# Patient Record
Sex: Male | Born: 1940 | Race: White | Hispanic: No | Marital: Married | Smoking: Former smoker
Health system: Southern US, Community
[De-identification: ages and names within clinical notes are randomized; demographics above are authoritative.]

## PROBLEM LIST (undated history)

## (undated) DIAGNOSIS — I2510 Atherosclerotic heart disease of native coronary artery without angina pectoris: Secondary | ICD-10-CM

## (undated) DIAGNOSIS — N189 Chronic kidney disease, unspecified: Secondary | ICD-10-CM

## (undated) DIAGNOSIS — I219 Acute myocardial infarction, unspecified: Secondary | ICD-10-CM

## (undated) DIAGNOSIS — M109 Gout, unspecified: Secondary | ICD-10-CM

## (undated) DIAGNOSIS — I480 Paroxysmal atrial fibrillation: Secondary | ICD-10-CM

## (undated) DIAGNOSIS — I10 Essential (primary) hypertension: Secondary | ICD-10-CM

## (undated) DIAGNOSIS — I2699 Other pulmonary embolism without acute cor pulmonale: Secondary | ICD-10-CM

## (undated) DIAGNOSIS — I209 Angina pectoris, unspecified: Secondary | ICD-10-CM

## (undated) DIAGNOSIS — I639 Cerebral infarction, unspecified: Secondary | ICD-10-CM

## (undated) HISTORY — DX: Paroxysmal atrial fibrillation: I48.0

## (undated) HISTORY — PX: CARDIAC CATHETERIZATION: SHX172

## (undated) HISTORY — PX: TOTAL KNEE ARTHROPLASTY: SHX125

## (undated) HISTORY — DX: Other pulmonary embolism without acute cor pulmonale: I26.99

## (undated) HISTORY — DX: Essential (primary) hypertension: I10

## (undated) HISTORY — PX: APPENDECTOMY: SHX54

## (undated) HISTORY — DX: Atherosclerotic heart disease of native coronary artery without angina pectoris: I25.10

---

## 2006-11-05 DIAGNOSIS — K219 Gastro-esophageal reflux disease without esophagitis: Secondary | ICD-10-CM | POA: Insufficient documentation

## 2006-11-06 ENCOUNTER — Ambulatory Visit: Payer: Self-pay | Admitting: Cardiology

## 2006-12-07 ENCOUNTER — Ambulatory Visit: Payer: Self-pay | Admitting: Cardiology

## 2007-06-11 ENCOUNTER — Encounter: Payer: Self-pay | Admitting: Cardiology

## 2007-06-16 ENCOUNTER — Ambulatory Visit: Payer: Self-pay | Admitting: Cardiology

## 2007-12-06 ENCOUNTER — Ambulatory Visit: Payer: Self-pay | Admitting: Cardiology

## 2008-01-18 ENCOUNTER — Encounter: Payer: Self-pay | Admitting: Cardiology

## 2008-01-22 ENCOUNTER — Ambulatory Visit: Payer: Self-pay | Admitting: Cardiology

## 2008-02-22 ENCOUNTER — Ambulatory Visit: Payer: Self-pay | Admitting: Cardiology

## 2008-10-03 ENCOUNTER — Ambulatory Visit: Payer: Self-pay | Admitting: Cardiology

## 2008-10-03 DIAGNOSIS — I4891 Unspecified atrial fibrillation: Secondary | ICD-10-CM | POA: Insufficient documentation

## 2008-10-03 DIAGNOSIS — I10 Essential (primary) hypertension: Secondary | ICD-10-CM | POA: Insufficient documentation

## 2009-01-17 ENCOUNTER — Telehealth (INDEPENDENT_AMBULATORY_CARE_PROVIDER_SITE_OTHER): Payer: Self-pay | Admitting: Physician Assistant

## 2009-01-17 ENCOUNTER — Encounter: Payer: Self-pay | Admitting: Cardiology

## 2009-02-04 ENCOUNTER — Ambulatory Visit: Payer: Self-pay | Admitting: Cardiology

## 2009-02-04 DIAGNOSIS — J310 Chronic rhinitis: Secondary | ICD-10-CM | POA: Insufficient documentation

## 2009-02-04 DIAGNOSIS — I4892 Unspecified atrial flutter: Secondary | ICD-10-CM | POA: Insufficient documentation

## 2009-08-06 ENCOUNTER — Ambulatory Visit (HOSPITAL_COMMUNITY): Admission: RE | Admit: 2009-08-06 | Discharge: 2009-08-06 | Payer: Self-pay | Admitting: General Surgery

## 2009-08-19 ENCOUNTER — Other Ambulatory Visit: Admission: RE | Admit: 2009-08-19 | Discharge: 2009-08-19 | Payer: Self-pay | Admitting: General Surgery

## 2009-10-20 ENCOUNTER — Telehealth (INDEPENDENT_AMBULATORY_CARE_PROVIDER_SITE_OTHER): Payer: Self-pay | Admitting: *Deleted

## 2009-10-27 ENCOUNTER — Ambulatory Visit: Payer: Self-pay | Admitting: Cardiology

## 2009-10-27 ENCOUNTER — Ambulatory Visit: Payer: Self-pay | Admitting: Internal Medicine

## 2009-10-27 ENCOUNTER — Inpatient Hospital Stay (HOSPITAL_COMMUNITY): Admission: RE | Admit: 2009-10-27 | Discharge: 2009-11-12 | Payer: Self-pay | Admitting: Orthopedic Surgery

## 2009-10-30 ENCOUNTER — Encounter: Payer: Self-pay | Admitting: Cardiology

## 2009-10-31 ENCOUNTER — Ambulatory Visit: Payer: Self-pay | Admitting: Vascular Surgery

## 2009-10-31 ENCOUNTER — Encounter (INDEPENDENT_AMBULATORY_CARE_PROVIDER_SITE_OTHER): Payer: Self-pay | Admitting: Orthopedic Surgery

## 2009-10-31 ENCOUNTER — Encounter: Payer: Self-pay | Admitting: Cardiology

## 2009-11-03 ENCOUNTER — Ambulatory Visit: Payer: Self-pay | Admitting: Cardiovascular Disease

## 2009-11-11 ENCOUNTER — Encounter: Payer: Self-pay | Admitting: Cardiology

## 2009-11-12 ENCOUNTER — Encounter: Payer: Self-pay | Admitting: Cardiology

## 2009-11-17 ENCOUNTER — Telehealth: Payer: Self-pay | Admitting: Cardiology

## 2009-11-17 ENCOUNTER — Encounter: Payer: Self-pay | Admitting: Cardiology

## 2009-11-17 LAB — CONVERTED CEMR LAB: POC INR: 1.1

## 2009-11-20 ENCOUNTER — Encounter: Payer: Self-pay | Admitting: Cardiology

## 2009-11-20 ENCOUNTER — Ambulatory Visit: Payer: Self-pay | Admitting: Cardiology

## 2009-11-20 DIAGNOSIS — I259 Chronic ischemic heart disease, unspecified: Secondary | ICD-10-CM | POA: Insufficient documentation

## 2009-11-20 DIAGNOSIS — R609 Edema, unspecified: Secondary | ICD-10-CM | POA: Insufficient documentation

## 2009-11-21 ENCOUNTER — Telehealth: Payer: Self-pay | Admitting: Cardiology

## 2009-11-21 ENCOUNTER — Encounter: Payer: Self-pay | Admitting: Cardiology

## 2009-11-21 LAB — CONVERTED CEMR LAB
POC INR: 2.6
Prothrombin Time: 26.3 s

## 2009-11-28 ENCOUNTER — Encounter: Payer: Self-pay | Admitting: Cardiology

## 2009-12-02 ENCOUNTER — Encounter: Payer: Self-pay | Admitting: Cardiology

## 2009-12-02 LAB — CONVERTED CEMR LAB: INR: 2

## 2009-12-12 ENCOUNTER — Ambulatory Visit: Payer: Self-pay | Admitting: Cardiology

## 2009-12-12 LAB — CONVERTED CEMR LAB: POC INR: 2.6

## 2009-12-19 ENCOUNTER — Ambulatory Visit: Payer: Self-pay | Admitting: Cardiology

## 2009-12-19 ENCOUNTER — Encounter: Payer: Self-pay | Admitting: Cardiology

## 2009-12-19 LAB — CONVERTED CEMR LAB: POC INR: 2.1

## 2009-12-30 ENCOUNTER — Ambulatory Visit: Payer: Self-pay | Admitting: Cardiology

## 2009-12-30 LAB — CONVERTED CEMR LAB: POC INR: 2.1

## 2010-01-08 ENCOUNTER — Ambulatory Visit
Admission: RE | Admit: 2010-01-08 | Discharge: 2010-01-08 | Payer: Self-pay | Source: Home / Self Care | Attending: Cardiology | Admitting: Cardiology

## 2010-01-08 ENCOUNTER — Encounter: Payer: Self-pay | Admitting: Cardiology

## 2010-01-20 ENCOUNTER — Ambulatory Visit: Admission: RE | Admit: 2010-01-20 | Discharge: 2010-01-20 | Payer: Self-pay | Source: Home / Self Care

## 2010-01-20 LAB — CONVERTED CEMR LAB: POC INR: 2

## 2010-02-03 NOTE — Assessment & Plan Note (Signed)
Summary: eph-post cone per rhonda was in mmh 1st   Visit Type:  Follow-up Primary Provider:  Wyvonnia Lora   History of Present Illness: the patient is 69 year old male with a history of approximately a year ago of postoperative atrial fibrillation after knee surgery. The patient was briefly on amiodarone therapy. He has not reported any recurrences over the last year until a couple weeks ago when he presented to the emergency room at [facility]. The patient was awoken in the middle of the night with palpitations and rapid heart rate. Arrival to the [facility] was found to be in atypical atrial flutter with a heart rate of 160 beats per minute. The patient already has taken a 60 mg of p.o. short-acting diltiazem. There was no response. In the ER he was given additional IV diltiazem and beta blocker. The patient converted to normal sinus rhythm. Of note is also has hypertension with recent was started on Benzapril by Dr. Margo Common. The patient states that 6 weeks ago this medication was started. After his ER visit several days later the patient did play 18 holes of golf. He has not reported any recurrent palpitations, chest pain shortness of breath orthopnea or PND. His EKG today is within normal limits.   Preventive Screening-Counseling & Management  Comments: Pt states chewed tobacco off/on for 20-25 yrs and quit about 2 yrs ago. He smoked for about 10-12 yrs and quit in the 1970's  Current Medications (verified): 1)  Alavert 10 Mg Tabs (Loratadine) .... One Tablet Daily 2)  Mvi .... One Tablet Daily 3)  Astelin 137 Mcg/spray Soln (Azelastine Hcl) .... One Spray Two Times A Day 4)  Cardizem Cd 300 Mg Xr24h-Cap (Diltiazem Hcl Coated Beads) .... Take 1 Tablet By Mouth Once A Day (Please Give Generic) 5)  Metoprolol Tartrate 25 Mg Tabs (Metoprolol Tartrate) .... Take One Tablet By Mouth Twice A Day 6)  Aspirin Ec 325 Mg Tbec (Aspirin) .... Take One Tablet By Mouth Daily 7)  Cvs Omeprazole 20 Mg Tbec  (Omeprazole) .... One Tablet At Bedtime 8)  Allopurinol 100 Mg Tabs (Allopurinol) .... Take 2 Tablets At Bedtime 9)  Centrum Silver  Tabs (Multiple Vitamins-Minerals) .... Take 1 Tablet By Mouth Once A Day 10)  Benazepril Hcl 20 Mg Tabs (Benazepril Hcl) .... Take 1 Tablet By Mouth Once A Day  Allergies: No Known Drug Allergies  Comments:  Nurse/Medical Assistant: The patient's medications were reviewed with the patient and were updated in the Medication List. Pt brought a list of medications to office visit.  Cyril Loosen, RN, BSN (February 04, 2009 2:54 PM)  Past History:  Past Medical History: Last updated: 02/26/2008 paroxysmal atrial fibrillation CHAD-2 score is one normal sinus rhythm with no recurrence well-preserved left ventricular function negative Cardiolite stress study long-standing hypertension renal insufficiency followed by primary care physician  Family History: Last updated: 02/26/2008 patient is married and lives with his wife. Patient is retired. No use of tobacco. Occasional use of alcohol  Social History: Last updated: 10/03/2008 the patient denies any tobacco use.  Risk Factors: Smoking Status: quit > 6 months (10/03/2008)  Review of Systems       The patient complains of palpitations.  The patient denies fatigue, malaise, fever, weight gain/loss, vision loss, decreased hearing, hoarseness, chest pain, shortness of breath, prolonged cough, wheezing, sleep apnea, coughing up blood, abdominal pain, blood in stool, nausea, vomiting, diarrhea, heartburn, incontinence, blood in urine, muscle weakness, joint pain, leg swelling, rash, skin lesions, headache, fainting, dizziness,  depression, anxiety, enlarged lymph nodes, easy bruising or bleeding, and environmental allergies.    Vital Signs:  Patient profile:   70 year old male Height:      71 inches Weight:      229.25 pounds Pulse rate:   81 / minute BP sitting:   156 / 91  (left arm) Cuff size:    regular  Vitals Entered By: Cyril Loosen, RN, BSN (February 04, 2009 2:51 PM) Comments Post hospital follow up   Physical Exam  Additional Exam:  General: Well-developed, well-nourished in no distress head: Normocephalic and atraumatic eyes PERRLA/EOMI intact, conjunctiva and lids normal nose: No deformity or lesions mouth normal dentition, normal posterior pharynx neck: Supple, no JVD.  No masses, thyromegaly or abnormal cervical nodes lungs: Normal breath sounds bilaterally without wheezing.  Normal percussion heart: regular rate and rhythm with normal S1 and S2, no S3 or S4.  PMI is normal.  No pathological murmurs abdomen: Normal bowel sounds, abdomen is soft and nontender without masses, organomegaly or hernias noted.  No hepatosplenomegaly musculoskeletal: Back normal, normal gait muscle strength and tone normal pulsus: Pulse is normal in all 4 extremities Extremities: No peripheral pitting edema neurologic: Alert and oriented x 3 skin: Intact without lesions or rashes cervical nodes: No significant adenopathy psychologic: Normal affect    EKG  Procedure date:  02/04/2009  Findings:      normal sinus rhythm with left atrial enlargement no acute ischemic changes. Heart rate 76 beats per minute QRS duration 88 ms. QTC 438 ms  Impression & Recommendations:  Problem # 1:  ATRIAL FLUTTER (ICD-427.32) the patient had an episode of atrial flutter which converted in the emergency room. He has had no recurrent palpitations. I increased his dose of Cardizem CD to 300 mg a day particularly in light of his hypertension today. His blood pressure was 156/91 mmHg. I do not think the patient currently needs an antiarrhythmic drug.at this point I would still be inclined to start the patient on Coumadin given the fact that his CHADS2 is only one His updated medication list for this problem includes:    Metoprolol Tartrate 25 Mg Tabs (Metoprolol tartrate) .[name]... Take one tablet by mouth  twice a day    Aspirin Ec 325 Mg Tbec (Aspirin) .[name]... Take one tablet by mouth daily  Problem # 2:  ESSENTIAL HYPERTENSION, BENIGN (ICD-401.1) the patient has significant hypertension despite the fact that he was recently started on Benzapril. His blood pressure does not improve with higher dose Cardizem we will add hydrochlorothiazide to his medical regimen. He can followup with Dr. Margo Common regarding his blood pressure His updated medication list for this problem includes:    Cardizem Cd 300 Mg Xr24h-cap (Diltiazem hcl coated beads) .[name]... Take 1 tablet by mouth once a day (please give generic)    Metoprolol Tartrate 25 Mg Tabs (Metoprolol tartrate) .[name]... Take one tablet by mouth twice a day    Aspirin Ec 325 Mg Tbec (Aspirin) .[name]... Take one tablet by mouth daily    Benazepril Hcl 20 Mg Tabs (Benazepril hcl) .[name]... Take 1 tablet by mouth once a day  Problem # 3:  CHRONIC RHINITIS (ICD-472.0) I recommended Zyrtec to the patient and told him to avoid any type of Sudafed as this may worsen his arrhythmia.  Other Orders: EKG w/ Interpretation (93000)  Patient Instructions: 1)  Increase Cardizem CD to 300mg  daily 2)  Follow up in  6 months. Prescriptions: CARDIZEM CD 300 MG XR24H-CAP (DILTIAZEM HCL  COATED BEADS) Take 1 tablet by mouth once a day (please give generic)  #30 x 6   Entered by:   Hoover Brunette, LPN   Authorized by:   Lewayne Bunting, MD, [facility]   Signed by:   Hoover Brunette, LPN on 16/10/9602   Method used:   Electronically to        [facility]* (retail)       [address]       [place], [place]  [address]       Ph: [phone]       Fax: [phone]   RxID:   [id number]

## 2010-02-03 NOTE — Progress Notes (Signed)
Summary: coumadin management  Phone Note Other Incoming   Caller: Aram Beecham @ [place] Reason for Call: Discuss lab or test results Summary of Call: Received call with results of PT/INR obtained on pt today.  PT 26.3   INR 2.6    Pt remained on 5mg  qd since last INR check.  Pt to continue coumadin 5mg  once daily and recheck INR on 11/24/09. Initial call taken by: Vashti Hey RN,  November 21, 2009 2:19 PM     Anticoagulant Therapy  Managed by: Vashti Hey RN PCP: Wyvonnia Lora Supervising MD: Andee Lineman MD, Michelle Piper Indication 1: Atrial Fibrillation Lab Used: [facility] Site: [place] PT 26.3 INR POC 2.6  Dietary changes: no    Health status changes: no    Bleeding/hemorrhagic complications: no    Recent/future hospitalizations: no    Any changes in medication regimen? no    Recent/future dental: no  Any missed doses?: no       Is patient compliant with meds? yes         Anticoagulation Management History:      His anticoagulation is being managed by telephone today.  Positive risk factors for bleeding include an age of 29 years or older.  The bleeding index is 'intermediate risk'.  Positive CHADS2 values include History of HTN.  Negative CHADS2 values include Age > 30 years old.  Prothrombin time is 26.3.  Anticoagulation responsible Chantal Worthey: Andee Lineman MD, Michelle Piper.  INR POC: 2.6.    Anticoagulation Management Assessment/Plan:      The patient's current anticoagulation dose is Coumadin 5 mg tabs: use as directed.  The next INR is due 11/24/2009.  Anticoagulation instructions were given to [facility].  Results were reviewed/authorized by Vashti Hey RN.  He was notified by Aram Beecham @ [facility].         Prior Anticoagulation Instructions: INR 1.1 Called Cynthia back.  Pt was d/c from hospital on 11/12/09.  Post op afib and MI.  Was d/c home on coumadin 5mg  once daily.  INR today was 1.1  No bleeding issues or abx.  Order given for pt to increase coumadin to 7.5mg  once daily and Gentiva to  recheck INR on 11/21/09.  Current Anticoagulation Instructions: INR 2.6 Received call with results of PT/INR obtained on pt today.  PT 26.3   INR 2.6    Pt remained on 5mg  qd since last INR check.  Pt to continue coumadin 5mg  once daily and recheck INR on 11/24/09.

## 2010-02-03 NOTE — Medication Information (Signed)
Summary: Coumadin Clinic  Anticoagulant Therapy  Managed by: Vashti Hey RN PCP: Wyvonnia Lora Supervising MD: Andee Lineman MD, Michelle Piper Indication 1: Atrial Fibrillation Lab Used: [facility] Site: [facility]  Dietary changes: no    Health status changes: no    Bleeding/hemorrhagic complications: no    Recent/future hospitalizations: no    Any changes in medication regimen? no    Recent/future dental: no  Any missed doses?: no       Is patient compliant with meds? yes      Comments: Received call from [place] at [place] that she had discharged pt.  INR on 11/28/09 was 2.0  Called pt.  Told to take coumadin 7.5mg  tonight then resume 5mg  once daily and appt made for INR check 12/12/09 in the [place] office.  Allergies: 1)  ! * Oxycodone/apap  Anticoagulation Management History:      His anticoagulation is being managed by telephone today.  Positive risk factors for bleeding include an age of 69 years or older.  The bleeding index is 'intermediate risk'.  Positive CHADS2 values include History of HTN.  Negative CHADS2 values include Age > 69 years old.  Today's INR is 2.0.  Anticoagulation responsible provider: Andee Lineman MD, Michelle Piper.    Anticoagulation Management Assessment/Plan:      The patient's current anticoagulation dose is Coumadin 5 mg tabs: use as directed.  The next INR is due 12/12/2009.  Anticoagulation instructions were given to patient.  Results were reviewed/authorized by Vashti Hey RN.  He was notified by Vashti Hey RN.         Prior Anticoagulation Instructions: INR 2.6 Received call with results of PT/INR obtained on pt today.  PT 26.3   INR 2.6    Pt remained on 5mg  qd since last INR check.  Pt to continue coumadin 5mg  once daily and recheck INR on 11/24/09.  Current Anticoagulation Instructions: INR 2.0 Pt told to take coumadin 7.5mg  tonight then resume 5mg  once daily   Anticoagulant Therapy  Managed by: Vashti Hey RN PCP: Wyvonnia Lora Supervising MD: Andee Lineman MD, Michelle Piper Indication  1: Atrial Fibrillation Lab Used: [facility] Site: [facility]          Comments: Received call from [place] at [place] that she had discharged pt.  INR on 11/28/09 was 2.0  Called pt.  Told to take coumadin 7.5mg  tonight then resume 5mg  once daily and appt made for INR check 12/12/09 in the [place] office.

## 2010-02-03 NOTE — Progress Notes (Signed)
Summary: coumadin management  Phone Note From Other Clinic Call back at [phone]   Caller: [facility] Request: Talk with Nurse Summary of Call: NURSE WOULD LIKE LISA TO CALL BACK TO GO OVER PT RESULTS Initial call taken by: Faythe Ghee,  November 17, 2009 3:00 PM  Follow-up for Phone Call        [place] back.  Pt was d/c from hospital on 11/12/09.  Post op afib and MI.  Was d/c home on coumadin 5mg  once daily.  INR today was 1.1  No bleeding issues or abx.  Order given for pt to increase coumadin to 7.5mg  once daily and Gentiva to recheck INR on 11/21/09. Follow-up by: Vashti Hey RN,  November 17, 2009 4:01 PM     Anticoagulant Therapy  Managed by: Vashti Hey RN PCP: Wyvonnia Lora Supervising MD: Dietrich Pates MD, Molly Maduro Indication 1: Atrial Fibrillation Lab Used: [facility] Site: [place] INR POC 1.1  Dietary changes: no    Health status changes: no    Bleeding/hemorrhagic complications: no    Recent/future hospitalizations: yes       Details: D/C from hospital on 11/12/09 post op afib/MI   Any changes in medication regimen? yes       Details: D/C on coumadin 5mg  qd   Has 5mg  tablet  Recent/future dental: no  Any missed doses?: no       Is patient compliant with meds? yes      Comments: [facility] is seeing pt    Anticoagulation Management History:      His anticoagulation is being managed by telephone today.  Positive risk factors for bleeding include an age of 62 years or older.  The bleeding index is 'intermediate risk'.  Positive CHADS2 values include History of HTN.  Negative CHADS2 values include Age > 21 years old.  Anticoagulation responsible provider: Dietrich Pates MD, Molly Maduro.  INR POC: 1.1.    Anticoagulation Management Assessment/Plan:      The next INR is due 11/21/2009.  Anticoagulation instructions were given to [facility].  Results were reviewed/authorized by Vashti Hey RN.  He was notified by Aram Beecham @ [facility].        Coagulation management  information includes: 11/12/09 D/C from hospital on coumadin 5mg  once daily .  Current Anticoagulation Instructions: INR 1.1 Called Cynthia back.  Pt was d/c from hospital on 11/12/09.  Post op afib and MI.  Was d/c home on coumadin 5mg  once daily.  INR today was 1.1  No bleeding issues or abx.  Order given for pt to increase coumadin to 7.5mg  once daily and Gentiva to recheck INR on 11/21/09.

## 2010-02-03 NOTE — Assessment & Plan Note (Signed)
Summary: 6 mo fu reminder-srs   Visit Type:  Follow-up Primary Provider:  Wyvonnia Lora  CC:  follow-up visit.  History of Present Illness: patient is a pleasant 70 year old male with a history of postoperative atrial fibrillation after knee surgery.  Four.  The patient was on amiodarone therapy.  This has been previously this continued.  He has essentially no cardiovascular complaints.  An EKG done today in the office demonstrates normal sinus rhythm with no acute ischemic changes.  He denies any palpitations, dizziness chest pain short of breath orthopnea PND.  I did point out to the patient that his blood pressures elevated particular with an elevated diastolic blood pressure.  He will have his blood pressure readings rechecked in the next couple of weeks and may need the addition of hydrochlorothiazide to his medical regimen.  Preventive Screening-Counseling & Management  Alcohol-Tobacco     Smoking Status: quit > 6 months     Year Quit: 30-35 yrs ago  Comments: Pt chewed tobacco after smoking but quit 2 yrs ago  Current Problems (verified): 1)  Essential Hypertension, Benign  (ICD-401.1) 2)  Atrial Fibrillation  (ICD-427.31)  Current Medications (verified): 1)  Alavert 10 Mg Tabs (Loratadine) .... One Tablet Daily 2)  Mvi .... One Tablet Daily 3)  Astelin 137 Mcg/spray Soln (Azelastine Hcl) .... One Spray Two Times A Day 4)  Diltiazem Hcl Er Beads 180 Mg Xr24h-Cap (Diltiazem Hcl Er Beads) .... Take One Capsule By Mouth Daily 5)  Metoprolol Tartrate 25 Mg Tabs (Metoprolol Tartrate) .... Take One Tablet By Mouth Twice A Day 6)  Aspirin Ec 325 Mg Tbec (Aspirin) .... Take One Tablet By Mouth Daily 7)  Cvs Omeprazole 20 Mg Tbec (Omeprazole) .... One Tablet At Bedtime 8)  Allopurinol 100 Mg Tabs (Allopurinol) .... Take 2 Tablets At Bedtime 9)  Centrum Silver  Tabs (Multiple Vitamins-Minerals) .... Take 1 Tablet By Mouth Once A Day  Allergies (verified): No Known Drug  Allergies  Past History:  Past Medical History: Last updated: 02/26/2008 paroxysmal atrial fibrillation CHAD-2 score is one normal sinus rhythm with no recurrence well-preserved left ventricular function negative Cardiolite stress study long-standing hypertension renal insufficiency followed by primary care physician  Family History: Last updated: 02/26/2008 patient is married and lives with his wife. Patient is retired. No use of tobacco. Occasional use of alcohol  Risk Factors: Smoking Status: quit > 6 months (10/03/2008)  Family History: Reviewed history from 02/26/2008 and no changes required. patient is married and lives with his wife. Patient is retired. No use of tobacco. Occasional use of alcohol  Social History: Reviewed history and no changes required. the patient denies any tobacco use.Smoking Status:  quit > 6 months  Review of Systems       The patient complains of difficulty walking.  The patient denies anorexia, fever, weight loss, weight gain, vision loss, decreased hearing, hoarseness, chest pain, syncope, dyspnea on exertion, peripheral edema, prolonged cough, headaches, hemoptysis, abdominal pain, melena, hematochezia, severe indigestion/heartburn, hematuria, incontinence, genital sores, muscle weakness, suspicious skin lesions, transient blindness, depression, unusual weight change, abnormal bleeding, enlarged lymph nodes, angioedema, breast masses, and testicular masses.    Vital Signs:  Patient profile:   70 year old male Height:      71 inches Weight:      230.75 pounds BMI:     32.30 O2 Sat:      94 % Pulse rate:   79 / minute BP sitting:   146 / 90  (left arm)  Cuff size:   regular  Vitals Entered By: Cyril Loosen, RN, BSN (October 03, 2008 8:59 AM)  Nutrition Counseling: Patient's BMI is greater than 25 and therefore counseled on weight management options. CC: follow-up visit   Physical Exam  General:  Well developed, well  nourished, in no acute distress. Head:  normocephalic and atraumatic Eyes:  PERRLA/EOM intact; conjunctiva and lids normal. Nose:  no deformity, discharge, inflammation, or lesions Mouth:  Teeth, gums and palate normal. Oral mucosa normal. Neck:  Neck supple, no JVD. No masses, thyromegaly or abnormal cervical nodes. Lungs:  Clear bilaterally to auscultation and percussion. Heart:  Non-displaced PMI, chest non-tender; regular rate and rhythm, S1, S2 without murmurs, rubs or gallops. Carotid upstroke normal, no bruit. Normal abdominal aortic size, no bruits. Femorals normal pulses, no bruits. Pedals normal pulses. No edema, no varicosities. Abdomen:  Bowel sounds positive; abdomen soft and non-tender without masses, organomegaly, or hernias noted. No hepatosplenomegaly. Msk:  Back normal, normal gait. Muscle strength and tone normal. Pulses:  pulses normal in all 4 extremities Extremities:  No clubbing or cyanosis. Neurologic:  Alert and oriented x 3. Skin:  Intact without lesions or rashes. Cervical Nodes:  no significant adenopathy Psych:  Normal affect.   EKG  Procedure date:  10/03/2008  Findings:      normal sinus rhythm no acute ischemic changes  Impression & Recommendations:  Problem # 1:  ESSENTIAL HYPERTENSION, BENIGN (ICD-401.1) the patient blood pressure is poorly controlled.  I asked him to follow-up on this in the next couple of weeks.  He may need the addition of hydrochlorothiazide 25 monos p.o. daily His updated medication list for this problem includes:    Diltiazem Hcl Er Beads 180 Mg Xr24h-cap (Diltiazem hcl er beads) .[name]... Take one capsule by mouth daily    Metoprolol Tartrate 25 Mg Tabs (Metoprolol tartrate) .[name]... Take one tablet by mouth twice a day    Aspirin Ec 325 Mg Tbec (Aspirin) .[name]... Take one tablet by mouth daily  Problem # 2:  ATRIAL FIBRILLATION (ICD-427.31) the patient remains in normal sinus rhythm.  He has discontinued amiodarone.  We will continue  a calcium channel blocker and beta-blocker.  He can continue on aspirin. The following medications were removed from the medication list:    Amiodarone Hcl 200 Mg Tabs (Amiodarone hcl) ..... One tablet daily His updated medication list for this problem includes:    Metoprolol Tartrate 25 Mg Tabs (Metoprolol tartrate) .[name]... Take one tablet by mouth twice a day    Aspirin Ec 325 Mg Tbec (Aspirin) .[name]... Take one tablet by mouth daily  Orders: EKG w/ Interpretation (93000)  Patient Instructions: 1)  Patient will have his blood pressure checked in the next couple of weeks.  2)  He will call us if he needs the addition of hydrochlorothiazide. 3)  Your physician wants you to follow-up in:   1 year.  You will receive a reminder letter in the mail two months in advance. If you don't receive a letter, please call our office to schedule the follow-up appointment.

## 2010-02-03 NOTE — Progress Notes (Signed)
  Phone Note From Other Clinic   Caller: [facility] MD Call For: Dr Lewayne Bunting Reason for Call: Schedule Patient Appt Details for Reason: Afib RVR Summary of Call: Pt had palps, went to [facility] hosp. In afib RVR. IV cardizem and lopressor, spont conv SR. No cp. ER MD felt pt could be d/c - agreed. Needs call for f/u appt. Left msg w/ ofc. Initial call taken by: Park Breed PA-C,  January 17, 2009 6:37 AM

## 2010-02-03 NOTE — Assessment & Plan Note (Signed)
Summary: [facility]   Visit Type:  Follow-up Primary Provider:  Wyvonnia Lora   History of Present Illness: Gregory Davila is a 70 year old gentleman who underwent total knee replacement. He developed chest pain on October 30, 2009 and ultimately ruled in for a non-ST-elevation infarction. His enzymes were markedly elevated. He was treated with an initial medical approaches as his chest pain had resolved and referred for cardiac catheterization. He also has newly diagnosed atrial fibrillation and pulmonary embolus.  the patient had recent venous Dopplers which were negative for DVT. On October 27 he also had a CT angiogram of the chest with a small pulmonary embolism.an echocardiogram was done which was a poor study. However it was felt that the ejection fraction was normal.However mild LV dysfunction by Cath.  the patient was found to have an occluded LAD this was felt to be a chronic occlusion with collaterals. There was moderate circumflex and right coronary artery disease. Dr. Excell Seltzer felt that the patient could be treated with aspirin and Coumadin, the latter for his atrial fibrillation. Given the chronic occlusion it would be little benefit from Plavix and we are trying to avoid triple having quite regimen. Back in a trial fibrillation. HR 134 Feeling good doing rehab. Walking with cane.  No scp. Abdominal gas. No sob.  Was started amidarone in hospital. Just started amiodarone. Monday Home He a The takes PT/INR. Recheck Friday scheduled. Significantluid overload. Needs la six Yellow phlegm check temperature. Cough. Looks anemic will draw CBC next week.   Preventive Screening-Counseling & Management  Alcohol-Tobacco     Smoking Status: quit     Year Quit: 1970  Current Medications (verified): 1)  Loratadine 10 Mg Tabs (Loratadine) .... Take 1 Tablet By Mouth Once A Day 2)  Cardizem Cd 300 Mg Xr24h-Cap (Diltiazem Hcl Coated Beads) .... Take 1 Tablet By Mouth Once A Day 3)   Metoprolol Tartrate 25 Mg Tabs (Metoprolol Tartrate) .... Take 1 Tablet By Mouth Three Times A Day 4)  Aspirin Ec 325 Mg Tbec (Aspirin) .... Take One Tablet By Mouth Daily 5)  Cvs Omeprazole 20 Mg Tbec (Omeprazole) .... One Tablet At Bedtime 6)  Allopurinol 100 Mg Tabs (Allopurinol) .... Take 2 Tablets At Bedtime 7)  Centrum Silver  Tabs (Multiple Vitamins-Minerals) .... Take 1 Tablet By Mouth Once A Day 8)  Benazepril Hcl 20 Mg Tabs (Benazepril Hcl) .... Take 1 Tablet By Mouth Once A Day 9)  Amiodarone Hcl 200 Mg Tabs (Amiodarone Hcl) .... Take 1 Tablet By Mouth Two Times A Day For One Month Then Reduce To Daily 10)  Hydrocodone-Acetaminophen 5-325 Mg Tabs (Hydrocodone-Acetaminophen) .... Take 1-2 By Mouth Every 4 Hours As Needed 11)  Methocarbamol 500 Mg Tabs (Methocarbamol) .... Take One By Mouth Every Six Hours As Needed 12)  Coumadin 5 Mg Tabs (Warfarin Sodium) .... Use As Directed 13)  Zolpidem Tartrate 5 Mg Tabs (Zolpidem Tartrate) .... Take 1 Tablet By Mouth Once A Day At Bedtime 14)  Diltiazem Hcl 60 Mg Tabs (Diltiazem Hcl) .... Take 1 Tablet By Mouth Once A Day As Needed 15)  Lasix 40 Mg Tabs (Furosemide) .... Take 1 Tablet By Mouth Once A Day  Allergies (verified): 1)  ! * Oxycodone/apap  Comments:  Nurse/Medical Assistant: The patient's medication list and allergies were reviewed with the patient and were updated in the Medication and Allergy Lists.  Past History:  Past Medical History: Last updated: 02/26/2008 paroxysmal atrial fibrillation CHAD-2 score is one normal sinus rhythm with no  recurrence well-preserved left ventricular function negative Cardiolite stress study long-standing hypertension renal insufficiency followed by primary care physician  Family History: Last updated: 02/26/2008 patient is married and lives with his wife. Patient is retired. No use of tobacco. Occasional use of alcohol  Social History: Last updated: 10/03/2008 the patient denies  any tobacco use.  Risk Factors: Smoking Status: quit (11/20/2009)  Social History: Smoking Status:  quit  Vital Signs:  Patient profile:   70 year old male Height:      71 inches Weight:      231 pounds BMI:     32.33 Pulse rate:   120 / minute BP sitting:   111 / 70  (left arm) Cuff size:   large  Vitals Entered By: Carlye Grippe (November 20, 2009 10:00 AM)  Nutrition Counseling: Patient's BMI is greater than 25 and therefore counseled on weight management options.  Physical Exam  Additional Exam:  General: Well-developed, well-nourished in no distress, pale-appearing head: Normocephalic and atraumatic eyes PERRLA/EOMI intact, conjunctiva and lids normal nose: No deformity or lesions mouth normal dentition, normal posterior pharynx neck: Supple, no JVD.  No masses, thyromegaly or abnormal cervical nodes lungs: Normal breath sounds bilaterally without wheezing.  Normal percussion heart: irregular rate and rhythm with normal S1 and S2, no S3 or S4.  PMI is normal.  No pathological murmurs abdomen: Normal bowel sounds, abdomen is soft and nontender without masses, organomegaly or hernias noted.  No hepatosplenomegaly musculoskeletal: Back normal, normal gait muscle strength and tone normal pulsus: Pulse is normal in all 4 extremities Extremities: 2-3+ peripheral pitting edema neurologic: Alert and oriented x 3 skin: Intact without lesions or rashes cervical nodes: No significant adenopathy psychologic: Normal affect    EKG  Procedure date:  11/20/2009  Findings:      atrial fibrillation heart rate 134 beats per minute no acute ischemic changes.  Impression & Recommendations:  Problem # 1:  ATRIAL FIBRILLATION (ICD-427.31) the patient is rapid atrial fibrillation despite the fact that recently amiodarone was started in hospital.I will further increase his Cardizem as well as his metoprolol. Amiodarone can be continued for now. If the patient next couple of weeks  does not maintain normal sinus rhythm then we can perform a cardioversion. His updated medication list for this problem includes:    Metoprolol Tartrate 25 Mg Tabs (Metoprolol tartrate) .[name]... Take 1 tablet by mouth three times a day    Aspirin Ec 325 Mg Tbec (Aspirin) .[name]... Take one tablet by mouth daily    Amiodarone Hcl 200 Mg Tabs (Amiodarone hcl) .[name]... Take 1 tablet by mouth two times a day for one month then reduce to daily    Coumadin 5 Mg Tabs (Warfarin sodium) ..... Use as directed  Orders: EKG w/ Interpretation ([id number])Future Orders: T-CBC No Diff ([id number]) ... 11/28/2009 T-Basic Metabolic Panel [id number]) ... 11/28/2009  Problem # 2:  EDEMA (ICD-782.3) the patient has significant edema on exam and given a prescription of Lasix 40 mg a day. I suspect he has diastolic heart failure in the setting of rapid atrial fibrillation. Future Orders: T-CBC No Diff ([id number]) ... 11/28/2009 T-Basic Metabolic Panel [id number]) ... 11/28/2009  Problem # 3:  CORONARY ARTERY DISEASE, S/P PTCA (ICD-414.9) the patient had a non-ST elevation myocardial infarction October 30, 2009. He underwent cardiac catheterization and was found to have a occluded LAD with collaterals. No further intervention was required and the plan was to treat the patient medically. His updated medication list for this problem includes:  Cardizem Cd 300 Mg Xr24h-cap (Diltiazem hcl coated beads) .[name]... Take 1 tablet by mouth once a day    Metoprolol Tartrate 25 Mg Tabs (Metoprolol tartrate) .[name]... Take 1 tablet by mouth three times a day    Aspirin Ec 325 Mg Tbec (Aspirin) .[name]... Take one tablet by mouth daily    Benazepril Hcl 20 Mg Tabs (Benazepril hcl) .[name]... Take 1 tablet by mouth once a day    Coumadin 5 Mg Tabs (Warfarin sodium) ..... Use as directed    Diltiazem Hcl 60 Mg Tabs (Diltiazem hcl) .[name]... Take 1 tablet by mouth once a day as needed  Problem # 4:  COUMADIN THERAPY (ICD-V58.61) the patient is on  chronic Coumadin therapy. His closely monitored at home by home health. I will also ask during his next blood draw to have a CBC drawn to make sure he is not anemic the patient does look pale in the office today  Patient Instructions: 1)  Lasix 40mg  daily 2)  Increase Cardizem CD to 300mg  daily 3)  Increase Metoprolol to 25mg  three times a day  4)  Labs:  home health can draw next Friday 5)  Follow up in  6 weeks Prescriptions: METOPROLOL TARTRATE 25 MG TABS (METOPROLOL TARTRATE) Take 1 tablet by mouth three times a day  #90 x 6   Entered by:   Hoover Brunette, LPN   Authorized by:   Lewayne Bunting, MD, [facility]   Signed by:   Hoover Brunette, LPN on 16/10/9602   Method used:   Electronically to        [facility]* (retail)       [address]       [place], [place]  [address]       Ph: [phone]       Fax: [phone]   RxID:   [id number] CARDIZEM CD 300 MG XR24H-CAP (DILTIAZEM HCL COATED BEADS) Take 1 tablet by mouth once a day  #30 x 6   Entered by:   Hoover Brunette, LPN   Authorized by:   Lewayne Bunting, MD, [facility]   Signed by:   Hoover Brunette, LPN on 10/31/2534   Method used:   Electronically to        [facility]* (retail)       [address]       [place], [place]  [address]       Ph: [phone]       Fax: [phone]   RxID:   [id number] LASIX 40 MG TABS (FUROSEMIDE) Take 1 tablet by mouth once a day  #30 x 6   Entered by:   Hoover Brunette, LPN   Authorized by:   Lewayne Bunting, MD, [facility]   Signed by:   Hoover Brunette, LPN on 32/35/5732   Method used:   Electronically to        [facility]* (retail)       [address]       [place], [place]  [address]       Ph: [phone]       Fax: [phone]   RxID:   [id number]

## 2010-02-03 NOTE — Medication Information (Signed)
Summary: [facility]  Anticoagulant Therapy  Managed by: Vashti Hey RN PCP: Wyvonnia Lora Supervising MD: Myrtis Ser MD, Tinnie Gens Indication 1: Atrial Fibrillation Lab Used: [facility] Site: [facility] INR POC 2.6  Dietary changes: no    Health status changes: no    Bleeding/hemorrhagic complications: no    Recent/future hospitalizations: no    Any changes in medication regimen? no    Recent/future dental: no  Any missed doses?: no       Is patient compliant with meds? yes       Allergies: 1)  ! * Oxycodone/apap  Anticoagulation Management History:      The patient is taking warfarin and comes in today for a routine follow up visit.  Positive risk factors for bleeding include an age of 69 years or older.  The bleeding index is 'intermediate risk'.  Positive CHADS2 values include History of HTN.  Negative CHADS2 values include Age > 72 years old.  His last INR was 2.0.  Anticoagulation responsible provider: Myrtis Ser MD, Tinnie Gens.  INR POC: 2.6.  Cuvette Lot#: [id number].    Anticoagulation Management Assessment/Plan:      The patient's current anticoagulation dose is Coumadin 5 mg tabs: use as directed.  The target INR is 2.0-3.0.  The next INR is due 12/30/2009.  Anticoagulation instructions were given to patient.  Results were reviewed/authorized by Vashti Hey RN.  He was notified by Vashti Hey RN.         Prior Anticoagulation Instructions: INR 2.0 Pt told to take coumadin 7.5mg  tonight then resume 5mg  once daily   Current Anticoagulation Instructions: INR 2.6 Continue coumadin 5mg  once daily  Prescriptions: COUMADIN 5 MG TABS (WARFARIN SODIUM) use as directed  #30 x 3   Entered by:   Vashti Hey RN   Authorized by:   Lewayne Bunting, MD, [facility]   Signed by:   Vashti Hey RN on 12/12/2009   Method used:   Electronically to        [facility]* (retail)       [address]       [place], [place]  [address]       Ph: [phone]       Fax: [phone]   RxID:   [id number]

## 2010-02-03 NOTE — Progress Notes (Signed)
Summary: Records Request  Faxed chest x-ray & EKG to [facility] at [facility] Pre-Op ([phone]). Debby Freiberg  October 20, 2009 2:48 PM

## 2010-02-05 NOTE — Medication Information (Signed)
Summary: [facility] - [facility]  Anticoagulant Therapy  Managed by: Vashti Hey RN PCP: Wyvonnia Lora Supervising MD: Andee Lineman MD, Michelle Piper Indication 1: Atrial Fibrillation Lab Used: [facility] Site: [facility] INR POC 2.1  Dietary changes: no    Health status changes: yes       Details: pt in NSR by EKG today  Bleeding/hemorrhagic complications: no    Recent/future hospitalizations: no    Any changes in medication regimen? no    Recent/future dental: no  Any missed doses?: no       Is patient compliant with meds? yes       Allergies: 1)  ! * Oxycodone/apap  Anticoagulation Management History:      The patient is taking warfarin and comes in today for a routine follow up visit.  Positive risk factors for bleeding include an age of 69 years or older.  The bleeding index is 'intermediate risk'.  Positive CHADS2 values include History of HTN.  Negative CHADS2 values include Age > 69 years old.  His last INR was 2.0.  Anticoagulation responsible provider: Andee Lineman MD, Michelle Piper.  INR POC: 2.1.  Cuvette Lot#: [id number].    Anticoagulation Management Assessment/Plan:      The patient's current anticoagulation dose is Coumadin 5 mg tabs: use as directed.  The target INR is 2.0-3.0.  The next INR is due 12/30/2009.  Anticoagulation instructions were given to patient.  Results were reviewed/authorized by Vashti Hey RN.  He was notified by Vashti Hey RN.        Coagulation management information includes: 11/12/09 D/C from hospital on coumadin 5mg  once daily   12/19/09  NSR by EKG.  Prior Anticoagulation Instructions: INR 2.6 Continue coumadin 5mg  once daily   Current Anticoagulation Instructions: INR 2.1 Continue coumadin 5mg  once daily

## 2010-02-05 NOTE — Assessment & Plan Note (Signed)
Summary: [facility]   Visit Type:  Follow-up Primary Provider:  Wyvonnia Lora   History of Present Illness: the patient is a 70 year old male with a history of coronary artery disease, prior pulmonary embolism as well as atrial fibrillation on Coumadin therapy. At cardiac catheterization the patient was found to have an occluded LAD with collaterals and moderate circumflex and right coronary artery disease.  In the interim the patient is in normal sinus rhythm. He is on amiodarone. He is on 200 mg.his CHADS2 score is one. He denies any substernal chest pain. His blood pressure readings have been running normal. He denies any shortness of breath orthopnea PND.  He is currently not on a statin drug. He denies any palpitations presyncope or syncope.  Preventive Screening-Counseling & Management  Alcohol-Tobacco     Smoking Status: quit     Year Quit: 1970  Current Medications (verified): 1)  Loratadine 10 Mg Tabs (Loratadine) .... Take 1 Tablet By Mouth Once A Day 2)  Cardizem Cd 300 Mg Xr24h-Cap (Diltiazem Hcl Coated Beads) .... Take 1 Tablet By Mouth Once A Day 3)  Metoprolol Tartrate 25 Mg Tabs (Metoprolol Tartrate) .... Take 1 Tablet By Mouth Three Times A Day 4)  Aspirin Ec 325 Mg Tbec (Aspirin) .... Take One Tablet By Mouth Daily 5)  Cvs Omeprazole 20 Mg Tbec (Omeprazole) .... One Tablet At Bedtime 6)  Allopurinol 100 Mg Tabs (Allopurinol) .... Take 2 Tablets At Bedtime 7)  Benazepril Hcl 20 Mg Tabs (Benazepril Hcl) .... Take 1 Tablet By Mouth Once A Day 8)  Amiodarone Hcl 200 Mg Tabs (Amiodarone Hcl) .... Take 1/2 Tab (100mg ) Daily 9)  Hydrocodone-Acetaminophen 5-325 Mg Tabs (Hydrocodone-Acetaminophen) .... Take 1-2 By Mouth Every 4 Hours As Needed 10)  Methocarbamol 500 Mg Tabs (Methocarbamol) .... Take One By Mouth Every Six Hours As Needed 11)  Coumadin 5 Mg Tabs (Warfarin Sodium) .... Use As Directed 12)  Diltiazem Hcl 60 Mg Tabs (Diltiazem Hcl) .... Take 1 Tablet By Mouth Once  A Day As Needed 13)  Ambien 5 Mg Tabs (Zolpidem Tartrate) .... Take 1 Tab By Mouth At Bedtime 14)  Pravachol 20 Mg Tabs (Pravastatin Sodium) .... Take 1 Tab By Mouth At Bedtime  Allergies (verified): 1)  ! * Oxycodone/apap  Comments:  Nurse/Medical Assistant: The patient's medication list and allergies were reviewed with the patient and were updated in the Medication and Allergy Lists.  Past History:  Family History: Last updated: 02/26/2008 patient is married and lives with his wife. Patient is retired. No use of tobacco. Occasional use of alcohol  Social History: Last updated: 10/03/2008 the patient denies any tobacco use.  Past Medical History: paroxysmal atrial fibrillation CHAD-2 score is one normal sinus rhythm with no recurrence well-preserved left ventricular function negative Cardiolite stress study long-standing hypertension renal insufficiency followed by primary care physician Coronary artery disease single vessel occluded LAD with left to right collaterals and moderate disease in the right coronary artery and circumflex  Review of Systems  The patient denies fatigue, malaise, fever, weight gain/loss, vision loss, decreased hearing, hoarseness, chest pain, palpitations, shortness of breath, prolonged cough, wheezing, sleep apnea, coughing up blood, abdominal pain, blood in stool, nausea, vomiting, diarrhea, heartburn, incontinence, blood in urine, muscle weakness, joint pain, leg swelling, rash, skin lesions, headache, fainting, dizziness, depression, anxiety, enlarged lymph nodes, easy bruising or bleeding, and environmental allergies.    Vital Signs:  Patient profile:   70 year old male Height:      73  inches Weight:      227 pounds Pulse rate:   65 / minute BP sitting:   118 / 78  (left arm) Cuff size:   large  Vitals Entered By: Carlye Grippe (January 08, 2010 9:59 AM)  Physical Exam  Additional Exam:  General: Well-developed, well-nourished in no  distress, pale-appearing head: Normocephalic and atraumatic eyes PERRLA/EOMI intact, conjunctiva and lids normal nose: No deformity or lesions mouth normal dentition, normal posterior pharynx neck: Supple, no JVD.  No masses, thyromegaly or abnormal cervical nodes lungs: Normal breath sounds bilaterally without wheezing.  Normal percussion heart: irregular rate and rhythm with normal S1 and S2, no S3 or S4.  PMI is normal.  No pathological murmurs abdomen: Normal bowel sounds, abdomen is soft and nontender without masses, organomegaly or hernias noted.  No hepatosplenomegaly musculoskeletal: Back normal, normal gait muscle strength and tone normal pulsus: Pulse is normal in all 4 extremities Extremities: 2-3+ peripheral pitting edema neurologic: Alert and oriented x 3 skin: Intact without lesions or rashes cervical nodes: No significant adenopathy psychologic: Normal affect    Impression & Recommendations:  Problem # 1:  ATRIAL FIBRILLATION (ICD-427.31) patient remains in normal sinus rhythm on amiodarone therapy. I have decreased amiodarone 100mg  p.o. q. daily His updated medication list for this problem includes:    Metoprolol Tartrate 25 Mg Tabs (Metoprolol tartrate) .[name]... Take 1 tablet by mouth three times a day    Aspirin Ec 325 Mg Tbec (Aspirin) .[name]... Take one tablet by mouth daily    Amiodarone Hcl 200 Mg Tabs (Amiodarone hcl) .[name]... Take 1/2 tab (100mg ) daily    Coumadin 5 Mg Tabs (Warfarin sodium) ..... Use as directed  Problem # 2:  ESSENTIAL HYPERTENSION, BENIGN (ICD-401.1) blood pressure is well-controlled. No further adjustments in medications as needed The following medications were removed from the medication list:    Lasix 40 Mg Tabs (Furosemide) .[name]... Take 1 tablet by mouth once a day His updated medication list for this problem includes:    Cardizem Cd 300 Mg Xr24h-cap (Diltiazem hcl coated beads) .[name]... Take 1 tablet by mouth once a day    Metoprolol Tartrate 25 Mg  Tabs (Metoprolol tartrate) .[name]... Take 1 tablet by mouth three times a day    Aspirin Ec 325 Mg Tbec (Aspirin) .[name]... Take one tablet by mouth daily    Benazepril Hcl 20 Mg Tabs (Benazepril hcl) .[name]... Take 1 tablet by mouth once a day    Diltiazem Hcl 60 Mg Tabs (Diltiazem hcl) .[name]... Take 1 tablet by mouth once a day as needed  Problem # 3:  CORONARY ARTERY DISEASE, S/P PTCA (ICD-414.9) the patient has coronary artery disease and needs a statin for secondary risk prevention. I have started Pravachol 20 mg p.o. q.h.s. he reports no recurrent chest pain. He does have moderate disease in the circumflex and right coronary artery and has an occluded LAD with collaterals His updated medication list for this problem includes:    Cardizem Cd 300 Mg Xr24h-cap (Diltiazem hcl coated beads) .[name]... Take 1 tablet by mouth once a day    Metoprolol Tartrate 25 Mg Tabs (Metoprolol tartrate) .[name]... Take 1 tablet by mouth three times a day    Aspirin Ec 325 Mg Tbec (Aspirin) .[name]... Take one tablet by mouth daily    Benazepril Hcl 20 Mg Tabs (Benazepril hcl) .[name]... Take 1 tablet by mouth once a day    Coumadin 5 Mg Tabs (Warfarin sodium) ..... Use as directed    Diltiazem  Hcl 60 Mg Tabs (Diltiazem hcl) .[name]... Take 1 tablet by mouth once a day as needed  Problem # 4:  COUMADIN THERAPY (ICD-V58.61) we will continue the patient on Coumadin. However in 5 months after he has completed a six-month course of Coumadin, this can likely be discontinued.  Patient Instructions: 1)  Decrease Amiodarone to 100mg  daily 2)  Ambien 5mg  at bedtime 3)  Pravachol 20mg  at bedtime 4)  Follow up in  6 months Prescriptions: PRAVACHOL 20 MG TABS (PRAVASTATIN SODIUM) Take 1 tab by mouth at bedtime  #30 x 6   Entered by:   Hoover Brunette, LPN   Authorized by:   Lewayne Bunting, MD, [facility]   Signed by:   Hoover Brunette, LPN on 16/10/9602   Method used:   Electronically to        [facility]* (retail)       [address]       [place], [place]  [address]       Ph: [phone]       Fax: [phone]   RxID:   [id number] AMIODARONE HCL 200 MG TABS (AMIODARONE HCL) take 1/2 tab (100mg ) daily  #45 x 3   Entered by:   Hoover Brunette, LPN   Authorized by:   Lewayne Bunting, MD, [facility]   Signed by:   Hoover Brunette, LPN on 10/31/2534   Method used:   Electronically to        [facility]* (retail)       [address]       [place], [place]  [address]       Ph: [phone]       Fax: [phone]   RxID:   [id number] PRAVACHOL 20 MG TABS (PRAVASTATIN SODIUM) Take 1 tab by mouth at bedtime  #30 x 6   Entered by:   Hoover Brunette, LPN   Authorized by:   Lewayne Bunting, MD, [facility]   Signed by:   Hoover Brunette, LPN on 32/35/5732   Method used:   Print then Give to Patient   RxID:   [id number]   Handout requested. AMBIEN 5 MG TABS (ZOLPIDEM TARTRATE) Take 1 tab by mouth at bedtime  #30 x 2   Entered by:   Hoover Brunette, LPN   Authorized by:   Lewayne Bunting, MD, [facility]   Signed by:   Hoover Brunette, LPN on 15/17/6160   Method used:   Print then Give to Patient   RxID:   [id number]   Handout requested.

## 2010-02-05 NOTE — Medication Information (Signed)
Summary: ccr-lr  Anticoagulant Therapy  Managed by: Vashti Hey RN Referring MD: Andee Lineman PCP: Wyvonnia Lora Supervising MD: Andee Lineman MD, Michelle Piper Indication 1: Atrial Fibrillation Lab Used: [facility] [place] Site: [place] INR POC 2.0  Dietary changes: no    Health status changes: no    Bleeding/hemorrhagic complications: no    Recent/future hospitalizations: no    Any changes in medication regimen? no    Recent/future dental: no  Any missed doses?: no       Is patient compliant with meds? yes       Allergies: 1)  ! * Oxycodone/apap  Anticoagulation Management History:      The patient is taking warfarin and comes in today for a routine follow up visit.  Positive risk factors for bleeding include an age of 69 years or older.  The bleeding index is 'intermediate risk'.  Positive CHADS2 values include History of HTN.  Negative CHADS2 values include Age > 69 years old.  His last INR was 2.0.  Anticoagulation responsible provider: Andee Lineman MD, Michelle Piper.  INR POC: 2.0.    Anticoagulation Management Assessment/Plan:      The patient's current anticoagulation dose is Coumadin 5 mg tabs: use as directed.  The target INR is 2.0-3.0.  The next INR is due 02/17/2010.  Anticoagulation instructions were given to patient.  Results were reviewed/authorized by Vashti Hey RN.  He was notified by Vashti Hey RN.         Prior Anticoagulation Instructions: INR 2.1 Continue coumadin 5mg  once daily   Current Anticoagulation Instructions: INR 2.0 Increase coumadin to 5mg  once daily except 7.5mg  on Tuesdays

## 2010-02-05 NOTE — Medication Information (Signed)
Summary: ccr-lr  Anticoagulant Therapy  Managed by: Vashti Hey RN PCP: Wyvonnia Lora Supervising MD: Andee Lineman MD, Michelle Piper Indication 1: Atrial Fibrillation Lab Used: [facility] Site: [place] INR POC 2.1  Dietary changes: no    Health status changes: no    Bleeding/hemorrhagic complications: no    Recent/future hospitalizations: no    Any changes in medication regimen? no    Recent/future dental: no  Any missed doses?: no       Is patient compliant with meds? yes       Allergies: 1)  ! * Oxycodone/apap  Anticoagulation Management History:      The patient is taking warfarin and comes in today for a routine follow up visit.  Positive risk factors for bleeding include an age of 69 years or older.  The bleeding index is 'intermediate risk'.  Positive CHADS2 values include History of HTN.  Negative CHADS2 values include Age > 69 years old.  His last INR was 2.0.  Anticoagulation responsible Lamesha Tibbits: Andee Lineman MD, Michelle Piper.  INR POC: 2.1.    Anticoagulation Management Assessment/Plan:      The patient's current anticoagulation dose is Coumadin 5 mg tabs: use as directed.  The target INR is 2.0-3.0.  The next INR is due 01/20/2010.  Anticoagulation instructions were given to patient.  Results were reviewed/authorized by Vashti Hey RN.  He was notified by Vashti Hey RN.         Prior Anticoagulation Instructions: INR 2.1 Continue coumadin 5mg  once daily   Current Anticoagulation Instructions: Same as Prior Instructions.

## 2010-02-05 NOTE — Assessment & Plan Note (Signed)
Summary: ekg  --agh  Nurse Visit   Vital Signs:  Patient profile:   70 year old male Height:      71 inches Weight:      231 pounds  Vitals Entered By: Carlye Grippe (December 19, 2009 8:30 AM)  Visit Type:  nurse visit  CC:  ekg.  CC: ekg   Preventive Screening-Counseling & Management  Alcohol-Tobacco     Smoking Status: quit     Year Quit: 1970  Past History:  Past Medical History: Last updated: 02/26/2008 paroxysmal atrial fibrillation CHAD-2 score is one normal sinus rhythm with no recurrence well-preserved left ventricular function negative Cardiolite stress study long-standing hypertension renal insufficiency followed by primary care physician   Current Medications (verified): 1)  Loratadine 10 Mg Tabs (Loratadine) .... Take 1 Tablet By Mouth Once A Day 2)  Cardizem Cd 300 Mg Xr24h-Cap (Diltiazem Hcl Coated Beads) .... Take 1 Tablet By Mouth Once A Day 3)  Metoprolol Tartrate 25 Mg Tabs (Metoprolol Tartrate) .... Take 1 Tablet By Mouth Three Times A Day 4)  Aspirin Ec 325 Mg Tbec (Aspirin) .... Take One Tablet By Mouth Daily 5)  Cvs Omeprazole 20 Mg Tbec (Omeprazole) .... One Tablet At Bedtime 6)  Allopurinol 100 Mg Tabs (Allopurinol) .... Take 2 Tablets At Bedtime 7)  Centrum Silver  Tabs (Multiple Vitamins-Minerals) .... Take 1 Tablet By Mouth Once A Day 8)  Benazepril Hcl 20 Mg Tabs (Benazepril Hcl) .... Take 1 Tablet By Mouth Once A Day 9)  Amiodarone Hcl 200 Mg Tabs (Amiodarone Hcl) .... Take 1 Tablet By Mouth Two Times A Day For One Month Then Reduce To Daily 10)  Hydrocodone-Acetaminophen 5-325 Mg Tabs (Hydrocodone-Acetaminophen) .... Take 1-2 By Mouth Every 4 Hours As Needed 11)  Methocarbamol 500 Mg Tabs (Methocarbamol) .... Take One By Mouth Every Six Hours As Needed 12)  Coumadin 5 Mg Tabs (Warfarin Sodium) .... Use As Directed 13)  Zolpidem Tartrate 5 Mg Tabs (Zolpidem Tartrate) .... Take 1 Tablet By Mouth Once A Day At Bedtime 14)  Diltiazem  Hcl 60 Mg Tabs (Diltiazem Hcl) .... Take 1 Tablet By Mouth Once A Day As Needed 15)  Lasix 40 Mg Tabs (Furosemide) .... Take 1 Tablet By Mouth Once A Day  Allergies (verified): 1)  ! * Oxycodone/apap  Comments:  Nurse/Medical Assistant: The patient is currently on medications but does not know the name or dosage at this time. Instructed to contact our office with details. Will update medication list at that time.  Orders Added: 1)  EKG w/ Interpretation [93000] Reviewed. Lewayne Bunting, MD, [facility]  December 24, 2009 5:13 AM

## 2010-02-17 ENCOUNTER — Encounter: Payer: Self-pay | Admitting: Cardiology

## 2010-02-17 ENCOUNTER — Encounter (INDEPENDENT_AMBULATORY_CARE_PROVIDER_SITE_OTHER): Payer: Medicare Other

## 2010-02-17 DIAGNOSIS — Z7901 Long term (current) use of anticoagulants: Secondary | ICD-10-CM

## 2010-02-17 DIAGNOSIS — I4891 Unspecified atrial fibrillation: Secondary | ICD-10-CM

## 2010-02-17 LAB — CONVERTED CEMR LAB: POC INR: 2.1

## 2010-02-25 NOTE — Medication Information (Signed)
Summary: ccr  Anticoagulant Therapy  Managed by: Vashti Hey RN Referring MD: Andee Lineman PCP: Wyvonnia Lora Supervising MD: Andee Lineman MD, Michelle Piper Indication 1: Atrial Fibrillation Lab Used: [facility] [place] Site: [place] INR POC 2.1  Dietary changes: no    Health status changes: no    Bleeding/hemorrhagic complications: no    Recent/future hospitalizations: no    Any changes in medication regimen? no    Recent/future dental: no  Any missed doses?: no       Is patient compliant with meds? yes       Allergies: 1)  ! * Oxycodone/apap  Anticoagulation Management History:      The patient is taking warfarin and comes in today for a routine follow up visit.  Positive risk factors for bleeding include an age of 69 years or older.  The bleeding index is 'intermediate risk'.  Positive CHADS2 values include History of HTN.  Negative CHADS2 values include Age > 69 years old.  His last INR was 2.0.  Anticoagulation responsible provider: Andee Lineman MD, Michelle Piper.  INR POC: 2.1.  Cuvette Lot#: [id number].    Anticoagulation Management Assessment/Plan:      The patient's current anticoagulation dose is Coumadin 5 mg tabs: use as directed.  The target INR is 2.0-3.0.  The next INR is due 03/17/2010.  Anticoagulation instructions were given to patient.  Results were reviewed/authorized by Vashti Hey RN.  He was notified by Vashti Hey RN.        Coagulation management information includes: 11/12/09 D/C from hospital on coumadin 5mg  once daily   12/19/09  NSR by EKG   MAY BE ABLET TO COME OFF COUMADIN 5/12 PER DR DEGENT.  Prior Anticoagulation Instructions: INR 2.0 Increase coumadin to 5mg  once daily except 7.5mg  on Tuesdays  Current Anticoagulation Instructions: INR 2.1 Continue coumadin 5mg  once daily except 7.5mg  on Tuesdays

## 2010-03-17 ENCOUNTER — Encounter (INDEPENDENT_AMBULATORY_CARE_PROVIDER_SITE_OTHER): Payer: Medicare Other

## 2010-03-17 ENCOUNTER — Encounter: Payer: Self-pay | Admitting: Cardiology

## 2010-03-17 DIAGNOSIS — I4891 Unspecified atrial fibrillation: Secondary | ICD-10-CM

## 2010-03-17 DIAGNOSIS — Z7901 Long term (current) use of anticoagulants: Secondary | ICD-10-CM

## 2010-03-17 LAB — HEPARIN LEVEL (UNFRACTIONATED)
Heparin Unfractionated: 0.27 IU/mL — ABNORMAL LOW (ref 0.30–0.70)
Heparin Unfractionated: 0.27 IU/mL — ABNORMAL LOW (ref 0.30–0.70)
Heparin Unfractionated: 0.29 IU/mL — ABNORMAL LOW (ref 0.30–0.70)
Heparin Unfractionated: 0.32 IU/mL (ref 0.30–0.70)
Heparin Unfractionated: 0.33 IU/mL (ref 0.30–0.70)
Heparin Unfractionated: 0.36 IU/mL (ref 0.30–0.70)
Heparin Unfractionated: 0.45 IU/mL (ref 0.30–0.70)
Heparin Unfractionated: 0.48 IU/mL (ref 0.30–0.70)
Heparin Unfractionated: 0.5 IU/mL (ref 0.30–0.70)

## 2010-03-17 LAB — CBC
HCT: 29.6 % — ABNORMAL LOW (ref 39.0–52.0)
HCT: 30.2 % — ABNORMAL LOW (ref 39.0–52.0)
HCT: 30.9 % — ABNORMAL LOW (ref 39.0–52.0)
HCT: 31 % — ABNORMAL LOW (ref 39.0–52.0)
HCT: 31 % — ABNORMAL LOW (ref 39.0–52.0)
HCT: 31.5 % — ABNORMAL LOW (ref 39.0–52.0)
Hemoglobin: 10.1 g/dL — ABNORMAL LOW (ref 13.0–17.0)
Hemoglobin: 10.3 g/dL — ABNORMAL LOW (ref 13.0–17.0)
Hemoglobin: 10.4 g/dL — ABNORMAL LOW (ref 13.0–17.0)
Hemoglobin: 10.5 g/dL — ABNORMAL LOW (ref 13.0–17.0)
Hemoglobin: 10.5 g/dL — ABNORMAL LOW (ref 13.0–17.0)
Hemoglobin: 10.7 g/dL — ABNORMAL LOW (ref 13.0–17.0)
MCH: 30.8 pg (ref 26.0–34.0)
MCH: 30.8 pg (ref 26.0–34.0)
MCH: 30.8 pg (ref 26.0–34.0)
MCH: 31 pg (ref 26.0–34.0)
MCH: 31 pg (ref 26.0–34.0)
MCH: 31.3 pg (ref 26.0–34.0)
MCHC: 33.7 g/dL (ref 30.0–36.0)
MCHC: 33.8 g/dL (ref 30.0–36.0)
MCHC: 33.9 g/dL (ref 30.0–36.0)
MCHC: 34.1 g/dL (ref 30.0–36.0)
MCHC: 34.3 g/dL (ref 30.0–36.0)
MCHC: 34.3 g/dL (ref 30.0–36.0)
MCV: 90.5 fL (ref 78.0–100.0)
MCV: 91 fL (ref 78.0–100.0)
MCV: 91.1 fL (ref 78.0–100.0)
MCV: 91.1 fL (ref 78.0–100.0)
MCV: 91.3 fL (ref 78.0–100.0)
MCV: 91.4 fL (ref 78.0–100.0)
Platelets: 422 10*3/uL — ABNORMAL HIGH (ref 150–400)
Platelets: 509 10*3/uL — ABNORMAL HIGH (ref 150–400)
Platelets: 592 10*3/uL — ABNORMAL HIGH (ref 150–400)
Platelets: 616 10*3/uL — ABNORMAL HIGH (ref 150–400)
Platelets: 626 10*3/uL — ABNORMAL HIGH (ref 150–400)
Platelets: 685 10*3/uL — ABNORMAL HIGH (ref 150–400)
RBC: 3.27 MIL/uL — ABNORMAL LOW (ref 4.22–5.81)
RBC: 3.31 MIL/uL — ABNORMAL LOW (ref 4.22–5.81)
RBC: 3.38 MIL/uL — ABNORMAL LOW (ref 4.22–5.81)
RBC: 3.4 MIL/uL — ABNORMAL LOW (ref 4.22–5.81)
RBC: 3.41 MIL/uL — ABNORMAL LOW (ref 4.22–5.81)
RBC: 3.46 MIL/uL — ABNORMAL LOW (ref 4.22–5.81)
RDW: 14.6 % (ref 11.5–15.5)
RDW: 14.9 % (ref 11.5–15.5)
RDW: 15 % (ref 11.5–15.5)
RDW: 15.1 % (ref 11.5–15.5)
RDW: 15.3 % (ref 11.5–15.5)
RDW: 15.3 % (ref 11.5–15.5)
WBC: 11.6 10*3/uL — ABNORMAL HIGH (ref 4.0–10.5)
WBC: 13.6 10*3/uL — ABNORMAL HIGH (ref 4.0–10.5)
WBC: 14.3 10*3/uL — ABNORMAL HIGH (ref 4.0–10.5)
WBC: 14.4 10*3/uL — ABNORMAL HIGH (ref 4.0–10.5)
WBC: 15.6 10*3/uL — ABNORMAL HIGH (ref 4.0–10.5)
WBC: 15.7 10*3/uL — ABNORMAL HIGH (ref 4.0–10.5)

## 2010-03-17 LAB — BASIC METABOLIC PANEL
BUN: 12 mg/dL (ref 6–23)
BUN: 17 mg/dL (ref 6–23)
CO2: 23 mEq/L (ref 19–32)
CO2: 24 mEq/L (ref 19–32)
Calcium: 8.4 mg/dL (ref 8.4–10.5)
Calcium: 8.6 mg/dL (ref 8.4–10.5)
Chloride: 100 mEq/L (ref 96–112)
Chloride: 99 mEq/L (ref 96–112)
Creatinine, Ser: 1.21 mg/dL (ref 0.4–1.5)
Creatinine, Ser: 1.35 mg/dL (ref 0.4–1.5)
GFR calc Af Amer: >60 mL/min (ref >60)
GFR calc Af Amer: >60 mL/min (ref >60)
GFR calc non Af Amer: 53 mL/min — ABNORMAL LOW (ref >60)
GFR calc non Af Amer: 60 mL/min — ABNORMAL LOW (ref >60)
Glucose, Bld: 109 mg/dL — ABNORMAL HIGH (ref 70–99)
Glucose, Bld: 110 mg/dL — ABNORMAL HIGH (ref 70–99)
Potassium: 3.9 mEq/L (ref 3.5–5.1)
Potassium: 4 mEq/L (ref 3.5–5.1)
Sodium: 132 mEq/L — ABNORMAL LOW (ref 135–145)
Sodium: 134 mEq/L — ABNORMAL LOW (ref 135–145)

## 2010-03-17 LAB — PROTIME-INR
INR: 1.56 — ABNORMAL HIGH (ref 0.00–1.49)
INR: 1.61 — ABNORMAL HIGH (ref 0.00–1.49)
INR: 1.63 — ABNORMAL HIGH (ref 0.00–1.49)
INR: 1.77 — ABNORMAL HIGH (ref 0.00–1.49)
INR: 2.01 — ABNORMAL HIGH (ref 0.00–1.49)
INR: 2.55 — ABNORMAL HIGH (ref 0.00–1.49)
INR: 2.68 — ABNORMAL HIGH (ref 0.00–1.49)
INR: 2.96 — ABNORMAL HIGH (ref 0.00–1.49)
INR: 3.23 — ABNORMAL HIGH (ref 0.00–1.49)
Prothrombin Time: 18.9 seconds — ABNORMAL HIGH (ref 11.6–15.2)
Prothrombin Time: 19.3 seconds — ABNORMAL HIGH (ref 11.6–15.2)
Prothrombin Time: 19.5 seconds — ABNORMAL HIGH (ref 11.6–15.2)
Prothrombin Time: 20.8 seconds — ABNORMAL HIGH (ref 11.6–15.2)
Prothrombin Time: 22.9 seconds — ABNORMAL HIGH (ref 11.6–15.2)
Prothrombin Time: 27.5 seconds — ABNORMAL HIGH (ref 11.6–15.2)
Prothrombin Time: 28.6 seconds — ABNORMAL HIGH (ref 11.6–15.2)
Prothrombin Time: 30.9 seconds — ABNORMAL HIGH (ref 11.6–15.2)
Prothrombin Time: 33 seconds — ABNORMAL HIGH (ref 11.6–15.2)

## 2010-03-17 LAB — URIC ACID: Uric Acid, Serum: 4.8 mg/dL (ref 4.0–7.8)

## 2010-03-17 LAB — CONVERTED CEMR LAB: POC INR: 2.6

## 2010-03-17 LAB — MRSA PCR SCREENING: MRSA by PCR: NEGATIVE

## 2010-03-18 LAB — CBC
HCT: 32.1 % — ABNORMAL LOW (ref 39.0–52.0)
HCT: 32.5 % — ABNORMAL LOW (ref 39.0–52.0)
HCT: 32.7 % — ABNORMAL LOW (ref 39.0–52.0)
HCT: 33.5 % — ABNORMAL LOW (ref 39.0–52.0)
HCT: 35.7 % — ABNORMAL LOW (ref 39.0–52.0)
HCT: 45.5 % (ref 39.0–52.0)
Hemoglobin: 11 g/dL — ABNORMAL LOW (ref 13.0–17.0)
Hemoglobin: 11.1 g/dL — ABNORMAL LOW (ref 13.0–17.0)
Hemoglobin: 11.2 g/dL — ABNORMAL LOW (ref 13.0–17.0)
Hemoglobin: 11.6 g/dL — ABNORMAL LOW (ref 13.0–17.0)
Hemoglobin: 12.1 g/dL — ABNORMAL LOW (ref 13.0–17.0)
Hemoglobin: 15.6 g/dL (ref 13.0–17.0)
MCH: 30.9 pg (ref 26.0–34.0)
MCH: 31.1 pg (ref 26.0–34.0)
MCH: 31.1 pg (ref 26.0–34.0)
MCH: 31.6 pg (ref 26.0–34.0)
MCH: 31.7 pg (ref 26.0–34.0)
MCH: 31.8 pg (ref 26.0–34.0)
MCHC: 33.6 g/dL (ref 30.0–36.0)
MCHC: 34 g/dL (ref 30.0–36.0)
MCHC: 34.4 g/dL (ref 30.0–36.0)
MCHC: 34.4 g/dL (ref 30.0–36.0)
MCHC: 34.5 g/dL (ref 30.0–36.0)
MCHC: 34.7 g/dL (ref 30.0–36.0)
MCV: 90.4 fL (ref 78.0–100.0)
MCV: 91.4 fL (ref 78.0–100.0)
MCV: 91.5 fL (ref 78.0–100.0)
MCV: 91.8 fL (ref 78.0–100.0)
MCV: 91.9 fL (ref 78.0–100.0)
MCV: 92.1 fL (ref 78.0–100.0)
Platelets: 209 10*3/uL (ref 150–400)
Platelets: 219 10*3/uL (ref 150–400)
Platelets: 228 10*3/uL (ref 150–400)
Platelets: 279 10*3/uL (ref 150–400)
Platelets: 290 10*3/uL (ref 150–400)
Platelets: 330 10*3/uL (ref 150–400)
RBC: 3.49 MIL/uL — ABNORMAL LOW (ref 4.22–5.81)
RBC: 3.54 MIL/uL — ABNORMAL LOW (ref 4.22–5.81)
RBC: 3.55 MIL/uL — ABNORMAL LOW (ref 4.22–5.81)
RBC: 3.66 MIL/uL — ABNORMAL LOW (ref 4.22–5.81)
RBC: 3.91 MIL/uL — ABNORMAL LOW (ref 4.22–5.81)
RBC: 5.03 MIL/uL (ref 4.22–5.81)
RDW: 14.6 % (ref 11.5–15.5)
RDW: 14.6 % (ref 11.5–15.5)
RDW: 14.7 % (ref 11.5–15.5)
RDW: 14.9 % (ref 11.5–15.5)
RDW: 14.9 % (ref 11.5–15.5)
RDW: 15 % (ref 11.5–15.5)
WBC: 12.8 10*3/uL — ABNORMAL HIGH (ref 4.0–10.5)
WBC: 13.8 10*3/uL — ABNORMAL HIGH (ref 4.0–10.5)
WBC: 13.9 10*3/uL — ABNORMAL HIGH (ref 4.0–10.5)
WBC: 15.1 10*3/uL — ABNORMAL HIGH (ref 4.0–10.5)
WBC: 18.1 10*3/uL — ABNORMAL HIGH (ref 4.0–10.5)
WBC: 9.1 10*3/uL (ref 4.0–10.5)

## 2010-03-18 LAB — HEPARIN LEVEL (UNFRACTIONATED)
Heparin Unfractionated: 0.19 IU/mL — ABNORMAL LOW (ref 0.30–0.70)
Heparin Unfractionated: 0.23 IU/mL — ABNORMAL LOW (ref 0.30–0.70)
Heparin Unfractionated: 0.26 IU/mL — ABNORMAL LOW (ref 0.30–0.70)
Heparin Unfractionated: 0.27 IU/mL — ABNORMAL LOW (ref 0.30–0.70)
Heparin Unfractionated: 0.33 IU/mL (ref 0.30–0.70)
Heparin Unfractionated: 0.43 IU/mL (ref 0.30–0.70)
Heparin Unfractionated: 0.5 IU/mL (ref 0.30–0.70)
Heparin Unfractionated: 0.56 IU/mL (ref 0.30–0.70)
Heparin Unfractionated: 0.59 IU/mL (ref 0.30–0.70)

## 2010-03-18 LAB — BASIC METABOLIC PANEL
BUN: 11 mg/dL (ref 6–23)
BUN: 12 mg/dL (ref 6–23)
BUN: 19 mg/dL (ref 6–23)
CO2: 24 mEq/L (ref 19–32)
CO2: 26 mEq/L (ref 19–32)
CO2: 26 mEq/L (ref 19–32)
Calcium: 8.2 mg/dL — ABNORMAL LOW (ref 8.4–10.5)
Calcium: 8.4 mg/dL (ref 8.4–10.5)
Calcium: 9 mg/dL (ref 8.4–10.5)
Chloride: 101 mEq/L (ref 96–112)
Chloride: 104 mEq/L (ref 96–112)
Chloride: 106 mEq/L (ref 96–112)
Creatinine, Ser: 1.34 mg/dL (ref 0.4–1.5)
Creatinine, Ser: 1.37 mg/dL (ref 0.4–1.5)
Creatinine, Ser: 1.49 mg/dL (ref 0.4–1.5)
GFR calc Af Amer: 57 mL/min — ABNORMAL LOW (ref >60)
GFR calc Af Amer: >60 mL/min (ref >60)
GFR calc Af Amer: >60 mL/min (ref >60)
GFR calc non Af Amer: 47 mL/min — ABNORMAL LOW (ref >60)
GFR calc non Af Amer: 52 mL/min — ABNORMAL LOW (ref >60)
GFR calc non Af Amer: 53 mL/min — ABNORMAL LOW (ref >60)
Glucose, Bld: 105 mg/dL — ABNORMAL HIGH (ref 70–99)
Glucose, Bld: 108 mg/dL — ABNORMAL HIGH (ref 70–99)
Glucose, Bld: 164 mg/dL — ABNORMAL HIGH (ref 70–99)
Potassium: 3.7 mEq/L (ref 3.5–5.1)
Potassium: 3.9 mEq/L (ref 3.5–5.1)
Potassium: 4.7 mEq/L (ref 3.5–5.1)
Sodium: 135 mEq/L (ref 135–145)
Sodium: 135 mEq/L (ref 135–145)
Sodium: 136 mEq/L (ref 135–145)

## 2010-03-18 LAB — PROTIME-INR
INR: 0.99 (ref 0.00–1.49)
INR: 1.11 (ref 0.00–1.49)
INR: 1.18 (ref 0.00–1.49)
INR: 1.31 (ref 0.00–1.49)
INR: 1.46 (ref 0.00–1.49)
INR: 1.68 — ABNORMAL HIGH (ref 0.00–1.49)
INR: 1.76 — ABNORMAL HIGH (ref 0.00–1.49)
INR: 1.83 — ABNORMAL HIGH (ref 0.00–1.49)
Prothrombin Time: 13.3 seconds (ref 11.6–15.2)
Prothrombin Time: 14.5 seconds (ref 11.6–15.2)
Prothrombin Time: 15.2 seconds (ref 11.6–15.2)
Prothrombin Time: 16.5 seconds — ABNORMAL HIGH (ref 11.6–15.2)
Prothrombin Time: 17.9 seconds — ABNORMAL HIGH (ref 11.6–15.2)
Prothrombin Time: 20 seconds — ABNORMAL HIGH (ref 11.6–15.2)
Prothrombin Time: 20.7 seconds — ABNORMAL HIGH (ref 11.6–15.2)
Prothrombin Time: 21.3 seconds — ABNORMAL HIGH (ref 11.6–15.2)

## 2010-03-18 LAB — PHOSPHORUS: Phosphorus: 3 mg/dL (ref 2.3–4.6)

## 2010-03-18 LAB — COMPREHENSIVE METABOLIC PANEL
ALT: 95 U/L — ABNORMAL HIGH (ref 0–53)
AST: 83 U/L — ABNORMAL HIGH (ref 0–37)
Albumin: 4 g/dL (ref 3.5–5.2)
Alkaline Phosphatase: 89 U/L (ref 39–117)
BUN: 17 mg/dL (ref 6–23)
CO2: 24 mEq/L (ref 19–32)
Calcium: 9.4 mg/dL (ref 8.4–10.5)
Chloride: 106 mEq/L (ref 96–112)
Creatinine, Ser: 1.53 mg/dL — ABNORMAL HIGH (ref 0.4–1.5)
GFR calc Af Amer: 55 mL/min — ABNORMAL LOW (ref >60)
GFR calc non Af Amer: 45 mL/min — ABNORMAL LOW (ref >60)
Glucose, Bld: 92 mg/dL (ref 70–99)
Potassium: 4.4 mEq/L (ref 3.5–5.1)
Sodium: 140 mEq/L (ref 135–145)
Total Bilirubin: 0.6 mg/dL (ref 0.3–1.2)
Total Protein: 8.2 g/dL (ref 6.0–8.3)

## 2010-03-18 LAB — CARDIAC PANEL(CRET KIN+CKTOT+MB+TROPI)
CK, MB: 101.8 ng/mL (ref 0.3–4.0)
CK, MB: 127.3 ng/mL (ref 0.3–4.0)
CK, MB: 2.4 ng/mL (ref 0.3–4.0)
CK, MB: 3.9 ng/mL (ref 0.3–4.0)
CK, MB: 5.8 ng/mL — ABNORMAL HIGH (ref 0.3–4.0)
CK, MB: 6.4 ng/mL (ref 0.3–4.0)
Relative Index: 10.8 — ABNORMAL HIGH (ref 0.0–2.5)
Relative Index: 14.2 — ABNORMAL HIGH (ref 0.0–2.5)
Relative Index: 4.8 — ABNORMAL HIGH (ref 0.0–2.5)
Relative Index: 5.6 — ABNORMAL HIGH (ref 0.0–2.5)
Relative Index: INVALID (ref 0.0–2.5)
Relative Index: INVALID (ref 0.0–2.5)
Total CK: 104 U/L (ref 7–232)
Total CK: 134 U/L (ref 7–232)
Total CK: 66 U/L (ref 7–232)
Total CK: 84 U/L (ref 7–232)
Total CK: 894 U/L — ABNORMAL HIGH (ref 7–232)
Total CK: 940 U/L — ABNORMAL HIGH (ref 7–232)
Troponin I: 0.04 ng/mL (ref 0.00–0.06)
Troponin I: 37.65 ng/mL (ref 0.00–0.06)
Troponin I: 4.35 ng/mL (ref 0.00–0.06)
Troponin I: 48.84 ng/mL (ref 0.00–0.06)
Troponin I: 5.87 ng/mL (ref 0.00–0.06)
Troponin I: 5.91 ng/mL (ref 0.00–0.06)

## 2010-03-18 LAB — DIFFERENTIAL
Basophils Absolute: 0 10*3/uL (ref 0.0–0.1)
Basophils Relative: 0 % (ref 0–1)
Eosinophils Absolute: 0 10*3/uL (ref 0.0–0.7)
Eosinophils Relative: 0 % (ref 0–5)
Lymphocytes Relative: 11 % — ABNORMAL LOW (ref 12–46)
Lymphs Abs: 1.7 10*3/uL (ref 0.7–4.0)
Monocytes Absolute: 1.6 10*3/uL — ABNORMAL HIGH (ref 0.1–1.0)
Monocytes Relative: 11 % (ref 3–12)
Neutro Abs: 11.8 10*3/uL — ABNORMAL HIGH (ref 1.7–7.7)
Neutrophils Relative %: 78 % — ABNORMAL HIGH (ref 43–77)

## 2010-03-18 LAB — PLATELET COUNT: Platelets: 384 10*3/uL (ref 150–400)

## 2010-03-18 LAB — URINALYSIS, ROUTINE W REFLEX MICROSCOPIC
Bilirubin Urine: NEGATIVE
Glucose, UA: NEGATIVE mg/dL
Hgb urine dipstick: NEGATIVE
Ketones, ur: NEGATIVE mg/dL
Nitrite: NEGATIVE
Protein, ur: NEGATIVE mg/dL
Specific Gravity, Urine: 1.015 (ref 1.005–1.030)
Urobilinogen, UA: 0.2 mg/dL (ref 0.0–1.0)
pH: 6 (ref 5.0–8.0)

## 2010-03-18 LAB — APTT: aPTT: 31 seconds (ref 24–37)

## 2010-03-18 LAB — MRSA PCR SCREENING: MRSA by PCR: NEGATIVE

## 2010-03-18 LAB — ABO/RH: ABO/RH(D): O POS

## 2010-03-18 LAB — SURGICAL PCR SCREEN
MRSA, PCR: POSITIVE — AB
Staphylococcus aureus: POSITIVE — AB

## 2010-03-18 LAB — TYPE AND SCREEN
ABO/RH(D): O POS
Antibody Screen: NEGATIVE

## 2010-03-18 LAB — MAGNESIUM: Magnesium: 2.3 mg/dL (ref 1.5–2.5)

## 2010-03-20 LAB — BASIC METABOLIC PANEL
BUN: 30 mg/dL — ABNORMAL HIGH (ref 6–23)
CO2: 26 mEq/L (ref 19–32)
Calcium: 9.4 mg/dL (ref 8.4–10.5)
Chloride: 105 mEq/L (ref 96–112)
Creatinine, Ser: 1.5 mg/dL (ref 0.4–1.5)
GFR calc Af Amer: 56 mL/min — ABNORMAL LOW (ref >60)
GFR calc non Af Amer: 47 mL/min — ABNORMAL LOW (ref >60)
Glucose, Bld: 95 mg/dL (ref 70–99)
Potassium: 4.6 mEq/L (ref 3.5–5.1)
Sodium: 136 mEq/L (ref 135–145)

## 2010-03-20 LAB — CBC
HCT: 45.7 % (ref 39.0–52.0)
Hemoglobin: 15.4 g/dL (ref 13.0–17.0)
MCH: 29.6 pg (ref 26.0–34.0)
MCHC: 33.6 g/dL (ref 30.0–36.0)
MCV: 88 fL (ref 78.0–100.0)
Platelets: 262 10*3/uL (ref 150–400)
RBC: 5.2 MIL/uL (ref 4.22–5.81)
RDW: 14.7 % (ref 11.5–15.5)
WBC: 11.4 10*3/uL — ABNORMAL HIGH (ref 4.0–10.5)

## 2010-03-20 LAB — SURGICAL PCR SCREEN: Staphylococcus aureus: POSITIVE — AB

## 2010-03-24 NOTE — Medication Information (Signed)
Summary: ccr-lr  Anticoagulant Therapy  Managed by: Vashti Hey RN Referring MD: Andee Lineman PCP: Wyvonnia Lora Supervising MD: Antoine Poche MD, Fayrene Fearing Indication 1: Atrial Fibrillation Lab Used: [facility] [place] Site: [place] INR POC 2.6  Dietary changes: no    Health status changes: no    Bleeding/hemorrhagic complications: no    Recent/future hospitalizations: no    Any changes in medication regimen? no    Recent/future dental: no  Any missed doses?: no       Is patient compliant with meds? yes       Allergies: 1)  ! * Oxycodone/apap  Anticoagulation Management History:      The patient is taking warfarin and comes in today for a routine follow up visit.  Positive risk factors for bleeding include an age of 69 years or older.  The bleeding index is 'intermediate risk'.  Positive CHADS2 values include History of HTN.  Negative CHADS2 values include Age > 35 years old.  His last INR was 2.0.  Anticoagulation responsible provider: Antoine Poche MD, Fayrene Fearing.  INR POC: 2.6.  Cuvette Lot#: [id number].    Anticoagulation Management Assessment/Plan:      The patient's current anticoagulation dose is Coumadin 5 mg tabs: use as directed.  The target INR is 2.0-3.0.  The next INR is due 03/17/2010.  Anticoagulation instructions were given to patient.  Results were reviewed/authorized by Vashti Hey RN.  He was notified by Vashti Hey RN.         Prior Anticoagulation Instructions: INR 2.1 Continue coumadin 5mg  once daily except 7.5mg  on Tuesdays  Current Anticoagulation Instructions: INR 2.6 Continue coumadin 5mg  once daily except 7.5mg  on Tuesdays

## 2010-04-07 ENCOUNTER — Other Ambulatory Visit: Payer: Self-pay | Admitting: *Deleted

## 2010-04-07 MED ORDER — WARFARIN SODIUM 5 MG PO TABS: 5.0000 mg | ORAL_TABLET | Freq: Every day | ORAL | Status: DC

## 2010-04-13 ENCOUNTER — Encounter: Payer: Self-pay | Admitting: Cardiology

## 2010-04-13 DIAGNOSIS — I4892 Unspecified atrial flutter: Secondary | ICD-10-CM

## 2010-04-13 DIAGNOSIS — Z7901 Long term (current) use of anticoagulants: Secondary | ICD-10-CM | POA: Insufficient documentation

## 2010-04-13 DIAGNOSIS — I4891 Unspecified atrial fibrillation: Secondary | ICD-10-CM

## 2010-04-14 ENCOUNTER — Ambulatory Visit (INDEPENDENT_AMBULATORY_CARE_PROVIDER_SITE_OTHER): Payer: Medicare Other | Admitting: *Deleted

## 2010-04-14 DIAGNOSIS — I4892 Unspecified atrial flutter: Secondary | ICD-10-CM

## 2010-04-14 DIAGNOSIS — Z7901 Long term (current) use of anticoagulants: Secondary | ICD-10-CM

## 2010-04-14 DIAGNOSIS — I4891 Unspecified atrial fibrillation: Secondary | ICD-10-CM

## 2010-04-14 LAB — POCT INR: INR: 2.5

## 2010-05-12 ENCOUNTER — Ambulatory Visit (INDEPENDENT_AMBULATORY_CARE_PROVIDER_SITE_OTHER): Payer: Medicare Other | Admitting: *Deleted

## 2010-05-12 DIAGNOSIS — I4891 Unspecified atrial fibrillation: Secondary | ICD-10-CM

## 2010-05-12 DIAGNOSIS — Z7901 Long term (current) use of anticoagulants: Secondary | ICD-10-CM

## 2010-05-12 DIAGNOSIS — I4892 Unspecified atrial flutter: Secondary | ICD-10-CM

## 2010-05-12 LAB — POCT INR: INR: 3

## 2010-05-19 NOTE — Assessment & Plan Note (Signed)
[facility]                          [facility] NOTE   Gregory Davila, Gregory Davila                       MRN:          [id number]  DATE:12/07/2006                            DOB:          1940-08-28    REFERRING PHYSICIAN:  Wyvonnia Lora, MD.   HISTORY OF PRESENT ILLNESS:  The patient is a pleasant male with a  history of paroxysmal atrial fibrillation admitted in November 2008 to  [facility].  The patient was placed on calcium blockers and beta blockers.  He converted back to normal sinus rhythm, and it was felt that he was  low risk for embolic disease given the low [place] score.  The patient then  returned several weeks later with atypical chest pain with symptoms  consistent with reflux.  He was placed on Protonix with dramatic  improvement in his symptomatology.  He had a prior echocardiogram study  and Cardiolite done, all of which were essentially within normal limits.  The patient stated today that he is doing quite well, he has had no  recurrent palpitations, he reports no chest pressure, shortness of  breath, orthopnea, or PND.   MEDICATIONS:  1. Blood pressure - Diltiazem-ER 240 mg p.o. daily.  2. Metoprolol-ER 25 mg p.o. daily.  3. Omeprazole 20 mg p.o. daily.  4. Allopurinol 100 mg p.o. daily.  5. Aspirin 81 mg a day.   PHYSICAL EXAMINATION:  VITAL SIGNS:  Blood pressure is 110/80, heart  rate is 76, weight is 215 pounds.  NECK:  Normal carotid upstroke, no carotid bruits.  LUNGS:  Clear breath sounds bilaterally.  HEART:  Regular rate and rhythm, normal S1 and S2, no murmurs, rubs, or  gallops.  ABDOMEN:  Soft.  EXTREMITY EXAM:  No cyanosis, clubbing, or edema.  NEURO:  The patient alert, oriented, and grossly nonfocal.   PROBLEM LIST:  1. Paroxysmal atrial fibrillation.      a.     CHAD-2 score is one.      b.     Normal sinus rhythm with no recurrence.  2. Well-preserved left ventricular function.  3. Negative Cardiolite  stress study.  4. Longstanding hypertension.  5. Renal insufficiency followed by Dr. Margo Common.   PLAN:  1. The patient is doing well.  We have made no change in his      medications.  He does not need      anticoagulation given his low [place] score.  2. The patient can follow up with [place] in six months.     Learta Codding, MD,FACC  Electronically Signed    GED/MedQ  DD: 12/07/2006  DT: 12/07/2006  Job #: [id number]   cc:   Wyvonnia Lora

## 2010-05-19 NOTE — Assessment & Plan Note (Signed)
[facility]                          [facility] NOTE   TILDON, SILVERIA                       MRN:          [id number]  DATE:02/22/2008                            DOB:          Oct 24, 1940    HISTORY OF PRESENT ILLNESS:  The patient is a very nice 70 year old male  with a history of paroxysmal atrial fibrillation with rapid ventricular  response.  The patient developed this problem after left knee  replacement.  His rate was difficult to control, and ultimately the  patient was initiated on amiodarone.  The patient also converted back to  normal sinus rhythm on discharge.  The patient now follows up in the  office and still in normal sinus rhythm with no significant EKG changes.  His QTCs were slightly prolonged at 464 msec likely from amiodarone.  The patient denies any chest pain, shortness of breath, orthopnea, or  PND.  During the hospitalization, ejection fraction was normal at 60-  65%.   MEDICATIONS:  1. Loratadine 10 mg p.o. daily.  2. Multivitamin daily.  3. Astelin nasal spray.  4. Cardizem CD 80 mg p.o. daily.  5. Lopressor 25 mg p.o. b.i.d.  6. Amiodarone 2 mg p.o. daily.  7. Aspirin 325 daily.  8. Omeprazole 20 mg p.o. nightly.   PHYSICAL EXAMINATION:  VITAL SIGNS:  Blood pressure 142/85, heart rate  81, weight is 221 pounds.  NECK:  Normal carotid upstroke and no carotid bruits.  No thyromegaly.  Non-nodular thyroid.  JVD 6 cm.  LUNGS:  Clear breath sounds bilaterally with no wheezing.  HEART:  Regular rate and rhythm with normal S1 and S2 and no  pathological murmurs.  ABDOMEN:  Soft, nontender.  No rebound or guarding.  Good bowel sounds.  EXTREMITIES:  No cyanosis, clubbing, or edema.  NEUROLOGIC:  The patient is alert, oriented, and grossly nonfocal.  The  patient is status post left knee surgery with selling related to the  knee surgery in the left leg.   PROBLEMS:  1. Postoperative atrial fibrillation, but  now in normal sinus rhythm.  2. Status post knee surgery.  3. History of gout.  4. History of hypertension.  5. History of hiatal hernia.   PLAN:  1. At this point in time, the patient remains in normal sinus rhythm.      I do think this was related to the stress around his surgery.  I      would not commit the patient for long-term amiodarone yet at this      point in time and we cut his dose to 100 mg a day for the next 4      weeks.  Then, he could stop amiodarone altogether.  2. I would continue the patient on Cardizem and Lopressor.  3. The patient's CHADS score is low and given again that it was a      single event postsurgery also I do not want to commit him to      Coumadin, but I agree with aspirin 325 mg p.o. daily.  4. The patient can follow up  with [place] in the next 6 months.     Learta Codding, MD,FACC  Electronically Signed    GED/MedQ  DD: 02/22/2008  DT: 02/23/2008  Job #: [id number]   cc:   Wyvonnia Lora

## 2010-05-19 NOTE — Assessment & Plan Note (Signed)
[facility]                          [facility] NOTE   STEPAN, VERRETTE                       MRN:          [id number]  DATE:12/06/2007                            DOB:          1940-10-01    PRIMARY CARDIOLOGIST:  Learta Codding, MD, [facility]   PRIMARY CARE PHYSICIAN:  Dr. Wyvonnia Lora   REASON FOR VISIT:  Followup paroxysmal atrial fibrillation.   HISTORY OF PRESENT ILLNESS:  I saw Gregory Davila back in June in Dr.  Margarita Mail absence.  He has a known history of paroxysmal atrial  fibrillation with a CHADS2 score of 1 and is maintained on aspirin as  well as long-acting Cardizem and metoprolol.  He has short-acting  Cardizem for use with recurrent prolonged palpitations, although has  been symptomatically very stable since I saw him.  He reports one  episode of palpitations after drinking 2 cups of caffeinated coffee, but  otherwise no limiting symptoms.  He has had no chest pain or dyspnea on  exertion.  His main complaint is of knee pain and he states that he is  due for a left knee replacement with Dr. Althea Charon in early January.   ALLERGIES:  No known drug allergies.   MEDICATIONS:  1. Diltiazem ER 240 mg p.o. daily.  2. Omeprazole 20 mg p.o. daily.  3. Aspirin 81 mg p.o. daily.  4. Loratadine daily.  5. Toprol-XL 50 mg p.o. daily.  6. Multivitamin daily.  7. Astelin nasal spray 1 spray to each nostril twice daily.  8. Short-acting diltiazem 60 mg p.r.n.  9. Ibuprofen p.r.n.  10.Allopurinol p.r.n.   REVIEW OF SYSTEMS:  As described in history of present illness,  otherwise negative.   PHYSICAL EXAMINATION:  VITAL SIGNS:  Blood pressure today is 155/90,  heart rate is 67 and regular, and weight is 227 pounds.  GENERAL:  An overweight male in no acute distress.  NECK:  No elevated jugular venous pressure or loud bruits.  No  thyromegaly is noted.  LUNGS:  Clear without labored breathing.  CARDIAC:  Regular rate and rhythm.  No  pathologic murmur or S3 gallop.  EXTREMITIES:  No pitting edema.   Electrocardiogram today shows sinus rhythm at 68 beats per minute.   IMPRESSION AND RECOMMENDATIONS:  Paroxysmal atrial fibrillation with a  CHADS2 score of 1.  We will plan to continue the present course  including aspirin, long-acting Cardizem, and long-acting metoprolol.  He  has short-acting Cardizem for breakthrough palpitations and has  otherwise not had to use this.  We will hold off on institution of any  antiarrhythmic therapy unless he has progressive symptomatology.  He is  due for knee replacement in January and may well have some breakthrough  arrhythmias at that time.  Certainly we can be consulted for followup as  needed at that point.  Otherwise, I will have him come back to see Dr.  Andee Lineman in the next 3 months.     [name], MD  Electronically Signed    SGM/MedQ  DD: 12/06/2007  DT: 12/07/2007  Job #: [id number]  cc:   Learta Codding, MD,FACC  Wyvonnia Lora

## 2010-05-19 NOTE — Assessment & Plan Note (Signed)
[facility]                          [facility] NOTE   DEVINE, KLINGEL                       MRN:          [id number]  DATE:06/16/2007                            DOB:          August 31, 1940    PRIMARY CARDIOLOGIST:  Learta Codding, MD, [facility]   PRIMARY CARE PHYSICIAN:  Dr. Wyvonnia Lora.   REASON FOR VISIT:  Followup of atrial fibrillation.   HISTORY OF PRESENT ILLNESS:  Mr. Vonbehren was recently admitted to the  hospital with a breakthrough episode of atrial fibrillation associated  with rapid ventricular response.  He states that this occurred after he  played 18 holes of golf and may have been somewhat dehydrated.  He had a  brief breakthrough back in February after having some caffeine and  otherwise prior to this, his last episode was in November 2008.  He has  a CHADS2 score of 1 and is on aspirin, Toprol-XL and diltiazem ER.  Dr.  Doyne Keel gave him a prescription for short-acting diltiazem in case, he  had further breakthroughs.  He has not been on antiarrhythmics.  I note  that he has had previous reassuring ischemic assessment and normal left  ventricular systolic function.  Today, his electrocardiogram shows sinus  rhythm at 74 beats per minute.  He otherwise feels back to baseline.   ALLERGIES:  No known drug allergies.   MEDICATIONS:  1. Diltiazem ER 240 mg p.o. daily.  2. Omeprazole 20 mg p.o. daily.  3. Allopurinol 100 mg p.o. daily.  4. Aspirin 81 mg p.o. daily.  5. Loratadine.  6. Toprol-XL 50 mg p.o. daily.  7. He also has diltiazem short-acting 60 mg p.r.n.   REVIEW OF SYSTEMS:  As per the history of present illness, otherwise  negative.   PHYSICAL EXAMINATION:  Blood pressure is 135/83, heart rate is 73, and  weigh is 218 pounds.  The patient is comfortable and in no acute distress.  NECK:  No elevated jugular venous pressure.  No audible bruits.  No  thyromegaly is noted.  LUNGS:  Clear without labored breathing  at rest.  HEART:  Regular rate and rhythm.  No loud murmur or gallop.  ABDOMEN:  Soft and nontender.  Normoactive bowel sounds.  EXTREMITIES:  No pitting edema.   IMPRESSIONS AND RECOMMENDATIONS:  Paroxysmal atrial fibrillation with a  CHADS2 score of 1.  I suggested that he continue his aspirin, long-  acting calcium channel blocker and long-acting beta-blocker.  Agree with  the addition of a p.r.n. short-acting dose of Cardizem for  breakthroughs.  Depending on frequency of events, he may actually need  to be considered for an antiarrhythmic (questioned flecainide, given his  previous reassuring assessment) or perhaps even an atrial fibrillation  ablation down the road.  I will have him follow up with Dr. Andee Lineman over  the next 3 months for symptom review.     [name], MD  Electronically Signed    SGM/MedQ  DD: 06/16/2007  DT: 06/17/2007  Job #: [id number]   cc:   Learta Codding, MD,FACC  Wyvonnia Lora

## 2010-05-22 NOTE — H&P (Signed)
NAME:  Gregory Davila, Gregory Davila                     ACCOUNT NO.:   MEDICAL RECORD NO.:                     PATIENT TYPE:   LOCATION:                                 FACILITY:   PHYSICIAN:  Dalia Heading, M.D.       DATE OF BIRTH:   DATE OF ADMISSION:  DATE OF DISCHARGE:  LH                              HISTORY & PHYSICAL   CHIEF COMPLAINT:  Skin nevus, right arm.   HISTORY OF PRESENT ILLNESS:  The patient is a 70 year old white male who  was referred for evaluation and treatment of a skin nevus on his right  arm.  He is concerned that maybe melanoma.  There is no history of skin  cancer.   PAST MEDICAL HISTORY:  Reflux disease, hypertension, gout.   PAST SURGICAL HISTORY:  Unremarkable.   CURRENT MEDICATIONS:  1. Omeprazole 20 mg p.o. daily.  2. Metoprolol 25 mg p.o. daily.  3. Diltiazem 300 mg p.o. daily.  4. Allopurinol 100 mg p.o. b.i.d.  5. Aspirin which she is holding.  6. Loratadine 10 mg p.o. daily.   ALLERGIES:  No known drug allergies.   REVIEW OF SYSTEMS:  The patient denies tobacco or significant alcohol  use.  Denies any other cardiopulmonary difficulties or bleeding  disorders.   FAMILY HISTORY:  Unremarkable.   PHYSICAL EXAMINATION:  GENERAL:  The patient is a well-developed, well-  nourished white male, in no acute distress.  HEENT:  No scleral icterus.  LUNGS:  Clear to auscultation with equal breath sounds bilaterally.  HEART:  Regular rate and rhythm without S3, S4, or murmurs.  EXTREMITIES:  A 4-mm variegated, irregular pigmented flat lesion on the  right arm.  No masses are noted.  No lymphadenopathy is noted.   IMPRESSION:  Skin nevus, right arm.   PLAN:  The patient is scheduled for excision of the skin nevus, right  arm on August 06, 2009.  The risks and benefits of the procedure  including bleeding, infection, and the possibility of malignancy were  fully explained to the patient, gave informed consent.      Dalia Heading, M.D.     MAJ/MEDQ  D:  07/31/2009  T:  08/01/2009  Job:  [id number]   cc:   Wyvonnia Lora  Fax: [phone]

## 2010-05-22 NOTE — H&P (Signed)
NAME:  Gregory Davila, Gregory Davila                ACCOUNT NO.:  [id number]   MEDICAL RECORD NO.:  [id number]        PATIENT TYPE:  PAMB   LOCATION:  DAY                           FACILITY:  [facility]   PHYSICIAN:  Dalia Heading, M.D.       DATE OF BIRTH:   DATE OF ADMISSION:  DATE OF DISCHARGE:  LH                              HISTORY & PHYSICAL   CHIEF COMPLAINT:  Skin nevus, right arm.   HISTORY OF PRESENT ILLNESS:  The patient is a 70 year old white male who  was referred for evaluation and treatment of a skin nevus on his right  arm.  He is concerned that maybe melanoma.  There is no history of skin  cancer.   PAST MEDICAL HISTORY:  Reflux disease, hypertension, gout.   PAST SURGICAL HISTORY:  Unremarkable.   CURRENT MEDICATIONS:  1. Omeprazole 20 mg p.o. daily.  2. Metoprolol 25 mg p.o. daily.  3. Diltiazem 300 mg p.o. daily.  4. Allopurinol 100 mg p.o. b.i.d.  5. Aspirin which she is holding.  6. Loratadine 10 mg p.o. daily.   ALLERGIES:  No known drug allergies.   REVIEW OF SYSTEMS:  The patient denies tobacco or significant alcohol  use.  Denies any other cardiopulmonary difficulties or bleeding  disorders.   FAMILY HISTORY:  Unremarkable.   PHYSICAL EXAMINATION:  GENERAL:  The patient is a well-developed, well-  nourished white male, in no acute distress.  HEENT:  No scleral icterus.  LUNGS:  Clear to auscultation with equal breath sounds bilaterally.  HEART:  Regular rate and rhythm without S3, S4, or murmurs.  EXTREMITIES:  A 4-mm variegated, irregular pigmented flat lesion on the  right arm.  No masses are noted.  No lymphadenopathy is noted.   IMPRESSION:  Skin nevus, right arm.   PLAN:  The patient is scheduled for excision of the skin nevus, right  arm on August 06, 2009.  The risks and benefits of the procedure  including bleeding, infection, and the possibility of malignancy were  fully explained to the patient, gave informed consent.      Dalia Heading,  M.D.     MAJ/MEDQ  D:  07/31/2009  T:  08/01/2009  Job:  [id number]   cc:   Wyvonnia Lora  Fax: [phone]

## 2010-06-09 ENCOUNTER — Ambulatory Visit (INDEPENDENT_AMBULATORY_CARE_PROVIDER_SITE_OTHER): Payer: Medicare Other | Admitting: *Deleted

## 2010-06-09 DIAGNOSIS — Z7901 Long term (current) use of anticoagulants: Secondary | ICD-10-CM

## 2010-06-09 DIAGNOSIS — I4891 Unspecified atrial fibrillation: Secondary | ICD-10-CM

## 2010-06-09 DIAGNOSIS — I4892 Unspecified atrial flutter: Secondary | ICD-10-CM

## 2010-06-09 LAB — POCT INR: INR: 2.8

## 2010-06-19 ENCOUNTER — Other Ambulatory Visit: Payer: Self-pay | Admitting: *Deleted

## 2010-06-19 MED ORDER — DILTIAZEM HCL ER COATED BEADS 300 MG PO CP24: 300.0000 mg | ORAL_CAPSULE | Freq: Every day | ORAL | Status: DC

## 2010-07-03 ENCOUNTER — Other Ambulatory Visit: Payer: Self-pay | Admitting: *Deleted

## 2010-07-03 MED ORDER — WARFARIN SODIUM 5 MG PO TABS: 5.0000 mg | ORAL_TABLET | Freq: Every day | ORAL | Status: DC

## 2010-07-07 ENCOUNTER — Ambulatory Visit (INDEPENDENT_AMBULATORY_CARE_PROVIDER_SITE_OTHER): Payer: Medicare Other | Admitting: *Deleted

## 2010-07-07 DIAGNOSIS — I4892 Unspecified atrial flutter: Secondary | ICD-10-CM

## 2010-07-07 DIAGNOSIS — I4891 Unspecified atrial fibrillation: Secondary | ICD-10-CM

## 2010-07-07 DIAGNOSIS — Z7901 Long term (current) use of anticoagulants: Secondary | ICD-10-CM

## 2010-07-07 LAB — POCT INR: INR: 1.9

## 2010-07-16 ENCOUNTER — Encounter: Payer: Self-pay | Admitting: Cardiology

## 2010-07-24 ENCOUNTER — Encounter: Payer: Self-pay | Admitting: Cardiology

## 2010-07-24 ENCOUNTER — Ambulatory Visit (INDEPENDENT_AMBULATORY_CARE_PROVIDER_SITE_OTHER): Payer: Medicare Other | Admitting: Cardiology

## 2010-07-24 VITALS — BP 129/83 | HR 68 | Ht 71.0 in | Wt 239.5 lb

## 2010-07-24 DIAGNOSIS — Z7901 Long term (current) use of anticoagulants: Secondary | ICD-10-CM

## 2010-07-24 DIAGNOSIS — I10 Essential (primary) hypertension: Secondary | ICD-10-CM

## 2010-07-24 DIAGNOSIS — Z9229 Personal history of other drug therapy: Secondary | ICD-10-CM

## 2010-07-24 DIAGNOSIS — Z09 Encounter for follow-up examination after completed treatment for conditions other than malignant neoplasm: Secondary | ICD-10-CM

## 2010-07-24 DIAGNOSIS — I259 Chronic ischemic heart disease, unspecified: Secondary | ICD-10-CM

## 2010-07-24 DIAGNOSIS — Z79899 Other long term (current) drug therapy: Secondary | ICD-10-CM

## 2010-07-24 DIAGNOSIS — I4891 Unspecified atrial fibrillation: Secondary | ICD-10-CM

## 2010-07-24 MED ORDER — WARFARIN SODIUM 5 MG PO TABS: 5.0000 mg | ORAL_TABLET | Freq: Every day | ORAL | Status: DC

## 2010-07-24 MED ORDER — PRAVASTATIN SODIUM 20 MG PO TABS: 20.0000 mg | ORAL_TABLET | Freq: Every day | ORAL | Status: DC

## 2010-07-24 NOTE — Patient Instructions (Addendum)
Your physician recommends that you go to the [facility] for lab work for [facility] & LFT's  If the results of your test are normal or stable, you will receive a letter.  If they are abnormal, the nurse will contact you by phone. Your physician wants you to follow up in: 6 months.  You will receive a reminder letter in the mail one-two months in advance.  If you don't receive a letter, please call our office to schedule the follow up appointment

## 2010-07-27 DIAGNOSIS — Z9229 Personal history of other drug therapy: Secondary | ICD-10-CM | POA: Insufficient documentation

## 2010-07-27 NOTE — Assessment & Plan Note (Signed)
Blood pressure well controlled. Continue medical therapy 

## 2010-07-27 NOTE — Assessment & Plan Note (Signed)
The patient remained in normal sinus rhythm. He also remains on Coumadin and reports no palpitations.

## 2010-07-27 NOTE — Assessment & Plan Note (Signed)
No recurrent chest pain. Ejection fraction only mildly decreased by bedside echocardiogram of approximately 50%. Continue risk factor modifications currently no indication for stress testing.

## 2010-07-27 NOTE — Progress Notes (Signed)
HPI The patient is a 70 year old male with history of coronary artery disease status post myocardial infarction at the time of knee surgery. Catheterization October 2011 demonstrated occluded LAD with left to right collaterals. The patient also has a history of atrial fibrillation and pulmonary embolism and is on Coumadin therapy. He has normal LV function. He has anterior Q waves the EKG. Is also on amiodarone therapy for maintenance of normal sinus rhythm. He has an elevated CHADSVASC score of 3. He does have dyslipidemia which is followed by his primary care physician. A bedside echocardiogram was performed and was noted for distal anterior akinesis with an ejection fraction of 50%. The patient denies any chest pain shortness of breath orthopnea PND. He has no palpitations or syncope.  No Known Allergies  Current Outpatient Prescriptions on File Prior to Visit  Medication Sig Dispense Refill  . allopurinol (ZYLOPRIM) 100 MG tablet Take 200 mg by mouth daily.       [name] amiodarone (PACERONE) 200 MG tablet Take 0.5 tablets by mouth Daily.      [name] aspirin 325 MG EC tablet Take 325 mg by mouth daily.        . benazepril (LOTENSIN) 20 MG tablet Take 1 tablet by mouth Daily.      [name] diltiazem (CARDIZEM CD) 300 MG 24 hr capsule Take 1 capsule (300 mg total) by mouth daily.  30 capsule  6  . HYDROcodone-acetaminophen (NORCO) 5-325 MG per tablet Take 1 tablet by mouth Every 4 hours.      . metoprolol tartrate (LOPRESSOR) 25 MG tablet Take 1 tablet by mouth 2 (two) times daily.       . Omeprazole ([employer] OMEPRAZOLE) 20 MG TBEC Take 1 tablet by mouth at bedtime.        [name] diltiazem (CARDIZEM) 60 MG tablet Take 60 mg by mouth daily as needed.        . zolpidem (AMBIEN) 5 MG tablet Take 1 tablet by mouth Daily.        Past Medical History  Diagnosis Date  . Paroxysmal atrial fibrillation   . CAD (coronary artery disease)     [place]? 2 score is one. Single vessel occluded LAD with left to right collaterals and  moderate disease in the right coronary artery and circumflex  . Hypertension     long-standing  . Renal insufficiency     followed by primary care physician    Past Surgical History  Procedure Date  . Normal sinus rhythm     no recurrence  . Well-preserved left ventricular function   . Negative cardiolite stress study     No family history on file.  History   Social History  . Marital Status: Married    Spouse Name: N/A    Number of Children: N/A  . Years of Education: N/A   Occupational History  . Retired    Social History Main Topics  . Smoking status: Former Smoker    Types: Cigarettes    Quit date: 01/04/1966  . Smokeless tobacco: Former Neurosurgeon    Types: Chew   Comment: quit chewing tobacco about 4 yrs ago  . Alcohol Use: Yes     "seldomly drink beer" 1 every 2-3 weeks  . Drug Use: No  . Sexually Active: Not on file   Other Topics Concern  . Not on file   Social History Narrative  . No narrative on file   WJX:BJYNWGNFA positives as outlined above. The remainder of the 60  point review of systems is negative  PHYSICAL EXAM BP 129/83  Pulse 68  Ht 5\' 11"  (1.803 m)  Wt 239 lb 8 oz (108.636 kg)  BMI 33.40 kg/m2  General: Well-developed, well-nourished in no distress Head: Normocephalic and atraumatic Eyes:PERRLA/EOMI intact, conjunctiva and lids normal Ears: No deformity or lesions Mouth:normal dentition, normal posterior pharynx Neck: Supple, no JVD.  No masses, thyromegaly or abnormal cervical nodes Lungs: Normal breath sounds bilaterally without wheezing.  Normal percussion Cardiac: regular rate and rhythm with normal S1 and S2, no S3 or S4.  PMI is normal.  No pathological murmurs Abdomen: Normal bowel sounds, abdomen is soft and nontender without masses, organomegaly or hernias noted.  No hepatosplenomegaly MSK: Back normal, normal gait muscle strength and tone normal Vascular: Pulse is normal in all 4 extremities Extremities: No peripheral pitting  edema Neurologic: Alert and oriented x 3 Skin: Intact without lesions or rashes Lymphatics: No significant adenopathy Psychologic: Normal affect  ECG: Normal sinus rhythm.  Echocardiogram [limited bedside echocardiogram]: Ejection fraction 50% with distal anterior akinesis. No major valvular lesions.  ASSESSMENT AND PLAN

## 2010-07-27 NOTE — Assessment & Plan Note (Signed)
We'll schedule TSH and LFTs.

## 2010-07-27 NOTE — Assessment & Plan Note (Signed)
No complications on Coumadin therapy. Followed in the Coumadin clinic.

## 2010-08-04 ENCOUNTER — Ambulatory Visit (INDEPENDENT_AMBULATORY_CARE_PROVIDER_SITE_OTHER): Payer: Medicare Other | Admitting: *Deleted

## 2010-08-04 DIAGNOSIS — I4891 Unspecified atrial fibrillation: Secondary | ICD-10-CM

## 2010-08-04 DIAGNOSIS — Z7901 Long term (current) use of anticoagulants: Secondary | ICD-10-CM

## 2010-08-04 DIAGNOSIS — I4892 Unspecified atrial flutter: Secondary | ICD-10-CM

## 2010-08-04 LAB — POCT INR: INR: 2.9

## 2010-09-01 ENCOUNTER — Ambulatory Visit (INDEPENDENT_AMBULATORY_CARE_PROVIDER_SITE_OTHER): Payer: Medicare Other | Admitting: *Deleted

## 2010-09-01 DIAGNOSIS — I4892 Unspecified atrial flutter: Secondary | ICD-10-CM

## 2010-09-01 DIAGNOSIS — Z7901 Long term (current) use of anticoagulants: Secondary | ICD-10-CM

## 2010-09-01 DIAGNOSIS — I4891 Unspecified atrial fibrillation: Secondary | ICD-10-CM

## 2010-09-01 LAB — POCT INR
INR: 3.1
INR: 3.1

## 2010-09-29 ENCOUNTER — Ambulatory Visit (INDEPENDENT_AMBULATORY_CARE_PROVIDER_SITE_OTHER): Payer: Medicare Other | Admitting: *Deleted

## 2010-09-29 DIAGNOSIS — I4891 Unspecified atrial fibrillation: Secondary | ICD-10-CM

## 2010-09-29 DIAGNOSIS — I4892 Unspecified atrial flutter: Secondary | ICD-10-CM

## 2010-09-29 DIAGNOSIS — Z7901 Long term (current) use of anticoagulants: Secondary | ICD-10-CM

## 2010-09-29 LAB — POCT INR: INR: 2.6

## 2010-10-23 ENCOUNTER — Other Ambulatory Visit: Payer: Self-pay | Admitting: *Deleted

## 2010-10-23 NOTE — Telephone Encounter (Signed)
Left message for patient to call office to clarify directions for metoprolol tartrate 25mg . Refill request has three times daily and directions here in chart say's twice daily.

## 2010-10-26 ENCOUNTER — Other Ambulatory Visit: Payer: Self-pay | Admitting: *Deleted

## 2010-10-26 MED ORDER — WARFARIN SODIUM 5 MG PO TABS: 5.0000 mg | ORAL_TABLET | Freq: Every day | ORAL | Status: DC

## 2010-10-26 NOTE — Telephone Encounter (Signed)
Spoke with patient and he said his directions for metoprolol tartrate 25mg  was for three times daily but most of the time takes it twice daily.

## 2010-10-27 ENCOUNTER — Encounter: Payer: Medicare Other | Admitting: *Deleted

## 2010-10-28 MED ORDER — METOPROLOL TARTRATE 25 MG PO TABS: 25.0000 mg | ORAL_TABLET | Freq: Two times a day (BID) | ORAL | Status: DC

## 2010-10-28 NOTE — Telephone Encounter (Signed)
Left message for patient to call office to inform him that MD reviewed phone note and want patient to take this medication bid.

## 2010-10-28 NOTE — Telephone Encounter (Signed)
Metoprolol short acting 25 mg PO BID

## 2010-10-28 NOTE — Telephone Encounter (Signed)
Patient informed that MD suggest twice daily dosing for lopressor 25mg .

## 2010-11-06 ENCOUNTER — Ambulatory Visit (INDEPENDENT_AMBULATORY_CARE_PROVIDER_SITE_OTHER): Payer: Medicare Other | Admitting: *Deleted

## 2010-11-06 DIAGNOSIS — I4891 Unspecified atrial fibrillation: Secondary | ICD-10-CM

## 2010-11-06 DIAGNOSIS — Z7901 Long term (current) use of anticoagulants: Secondary | ICD-10-CM

## 2010-11-06 DIAGNOSIS — I4892 Unspecified atrial flutter: Secondary | ICD-10-CM

## 2010-11-06 LAB — POCT INR: INR: 2.3

## 2010-12-09 ENCOUNTER — Other Ambulatory Visit: Payer: Self-pay | Admitting: Cardiology

## 2010-12-10 ENCOUNTER — Other Ambulatory Visit: Payer: Self-pay | Admitting: *Deleted

## 2010-12-10 MED ORDER — METOPROLOL TARTRATE 25 MG PO TABS: 25.0000 mg | ORAL_TABLET | Freq: Two times a day (BID) | ORAL | Status: DC

## 2010-12-18 ENCOUNTER — Ambulatory Visit (INDEPENDENT_AMBULATORY_CARE_PROVIDER_SITE_OTHER): Payer: Medicare Other | Admitting: *Deleted

## 2010-12-18 DIAGNOSIS — I4892 Unspecified atrial flutter: Secondary | ICD-10-CM

## 2010-12-18 DIAGNOSIS — I4891 Unspecified atrial fibrillation: Secondary | ICD-10-CM

## 2010-12-18 DIAGNOSIS — Z7901 Long term (current) use of anticoagulants: Secondary | ICD-10-CM

## 2010-12-18 LAB — POCT INR: INR: 2.9

## 2010-12-18 MED ORDER — WARFARIN SODIUM 5 MG PO TABS: ORAL_TABLET | ORAL | Status: DC

## 2011-01-05 HISTORY — PX: NM MYOVIEW LTD: HXRAD82

## 2011-01-28 ENCOUNTER — Ambulatory Visit (INDEPENDENT_AMBULATORY_CARE_PROVIDER_SITE_OTHER): Payer: Medicare Other | Admitting: Cardiology

## 2011-01-28 ENCOUNTER — Ambulatory Visit (INDEPENDENT_AMBULATORY_CARE_PROVIDER_SITE_OTHER): Payer: Medicare Other | Admitting: *Deleted

## 2011-01-28 ENCOUNTER — Encounter: Payer: Self-pay | Admitting: *Deleted

## 2011-01-28 ENCOUNTER — Encounter: Payer: Medicare Other | Admitting: *Deleted

## 2011-01-28 VITALS — BP 120/85 | HR 58 | Ht 71.0 in | Wt 243.0 lb

## 2011-01-28 DIAGNOSIS — I2510 Atherosclerotic heart disease of native coronary artery without angina pectoris: Secondary | ICD-10-CM

## 2011-01-28 DIAGNOSIS — Z7901 Long term (current) use of anticoagulants: Secondary | ICD-10-CM

## 2011-01-28 DIAGNOSIS — I4891 Unspecified atrial fibrillation: Secondary | ICD-10-CM

## 2011-01-28 DIAGNOSIS — I4892 Unspecified atrial flutter: Secondary | ICD-10-CM

## 2011-01-28 DIAGNOSIS — I2699 Other pulmonary embolism without acute cor pulmonale: Secondary | ICD-10-CM

## 2011-01-28 DIAGNOSIS — N289 Disorder of kidney and ureter, unspecified: Secondary | ICD-10-CM

## 2011-01-28 DIAGNOSIS — I480 Paroxysmal atrial fibrillation: Secondary | ICD-10-CM

## 2011-01-28 DIAGNOSIS — I10 Essential (primary) hypertension: Secondary | ICD-10-CM

## 2011-01-28 LAB — POCT INR: INR: 2.2

## 2011-01-28 NOTE — Patient Instructions (Signed)
Your physician you to follow up in 1 year. You will receive a reminder letter in the mail one-two months in advance. If you don't receive a letter, please call our office to schedule the follow-up appointment. Your physician recommends that you continue on your current medications as directed. Please refer to the Current Medication list given to you today. 

## 2011-01-29 ENCOUNTER — Encounter: Payer: Medicare Other | Admitting: *Deleted

## 2011-01-31 ENCOUNTER — Encounter: Payer: Self-pay | Admitting: Cardiology

## 2011-01-31 DIAGNOSIS — N289 Disorder of kidney and ureter, unspecified: Secondary | ICD-10-CM | POA: Insufficient documentation

## 2011-01-31 DIAGNOSIS — I2510 Atherosclerotic heart disease of native coronary artery without angina pectoris: Secondary | ICD-10-CM | POA: Insufficient documentation

## 2011-01-31 DIAGNOSIS — I10 Essential (primary) hypertension: Secondary | ICD-10-CM | POA: Insufficient documentation

## 2011-01-31 DIAGNOSIS — I2699 Other pulmonary embolism without acute cor pulmonale: Secondary | ICD-10-CM | POA: Insufficient documentation

## 2011-01-31 DIAGNOSIS — I4819 Other persistent atrial fibrillation: Secondary | ICD-10-CM | POA: Insufficient documentation

## 2011-01-31 NOTE — Progress Notes (Signed)
Gregory Bottoms, MD, [facility] ABIM Board Certified in Adult Cardiovascular Medicine,Internal Medicine and Critical Care Medicine    CC: followup patient with a history of coronary artery disease and myocardial infarction after knee surgery  HPI:  The patient is doing well from a cardiovascular perspective.  He remains in normal sinus rhythm by electrocardiogram.  Both Coumadin and amiodarone therapy.  He appears to be tolerating both medications.  He has stopped smoking.  The patient states that he has no shortness of breath, orthopnea or PND.  He reports no chest pain.  He does exercise on a regular basis and plays golf frequently. The patient stated that he did have recently some rhinitis and cough which sometimes, but this has improved. Of note is that his TSH from July 2012 was 5.17.  Also, there was some slight increase in his AST from December 2011 to July 2012 from 47-59. Otherwise he is asymptomatic.  His blood pressures been well-controlled.  His LDL is 95 with a total cholesterol of 162  PMH: reviewed and listed in Problem List in Electronic Records (and see below) Past Medical History  Diagnosis Date  . Paroxysmal atrial fibrillation     on Coumadin therapy .CHADS2Vas=3, on amiodarone therapy.  [name] CAD (coronary artery disease)     . Single vessel occluded LAD with left to right collaterals and moderate disease in the right coronary artery and circumflex.  Ejection fraction 50%.  A bedside echocardiogram July 2012.  Distal anterior akinesis.  . Hypertension     long-standing  . Renal insufficiency     followed by primary care physician  . Pulmonary embolism    Past Surgical History  Procedure Date  . Normal sinus rhythm     no recurrence  . Well-preserved left ventricular function   . Negative cardiolite stress study     Allergies/SH/FHX : available in Electronic Records for review  No Known Allergies History   Social History  . Marital Status: Married    Spouse Name:  N/A    Number of Children: N/A  . Years of Education: N/A   Occupational History  . Retired    Social History Main Topics  . Smoking status: Former Smoker    Types: Cigarettes    Quit date: 01/04/1966  . Smokeless tobacco: Former Neurosurgeon    Types: Chew    Quit date: 01/05/2007   Comment: quit chewing tobacco about 4 yrs ago  . Alcohol Use: Yes     "seldomly drink beer" 1 every 2-3 weeks  . Drug Use: No  . Sexually Active: Not on file   Other Topics Concern  . Not on file   Social History Narrative  . No narrative on file   No family history on file.  Medications: Current Outpatient Prescriptions  Medication Sig Dispense Refill  . allopurinol (ZYLOPRIM) 100 MG tablet Take 200 mg by mouth daily.       [name] amiodarone (PACERONE) 200 MG tablet Take 0.5 tablets by mouth Daily.      [name] aspirin 325 MG EC tablet Take 325 mg by mouth daily.        . benazepril (LOTENSIN) 20 MG tablet Take 1 tablet by mouth Daily.      [name] COLCRYS 0.6 MG tablet Take 1 tablet by mouth 2 (two) times daily as needed.      . diltiazem (CARDIZEM CD) 300 MG 24 hr capsule Take 1 capsule (300 mg total) by mouth daily.  30 capsule  6  . diltiazem (CARDIZEM) 60 MG tablet Take 60 mg by mouth daily as needed.        . loratadine (CLARITIN) 10 MG tablet Take 10 mg by mouth daily.      . metoprolol tartrate (LOPRESSOR) 25 MG tablet Take 1 tablet (25 mg total) by mouth 2 (two) times daily.  60 tablet  6  . multivitamin (THERAGRAN) per tablet Take 1 tablet by mouth daily.        . pravastatin (PRAVACHOL) 20 MG tablet Take 1 tablet (20 mg total) by mouth at bedtime.  30 tablet  6  . warfarin (COUMADIN) 5 MG tablet Take 1 tablet daily except 1 1/2 tablets on Tuesdays or as directed  40 tablet  6    ROS: No nausea or vomiting. No fever or chills.No melena or hematochezia.No bleeding.No claudication  Physical Exam: BP 120/85  Pulse 58  Ht 5\' 11"  (1.803 m)  Wt 243 lb (110.224 kg)  BMI 33.89 kg/m2 General:well-nourished  white male in no distress Neck:supple.  Normal carotid upstroke.  No carotid bruits.  No thyromegaly.  No nodular thyroid.  JVP is 5-6 cm Lungs:clear breath sounds bilaterally without wheezing Cardiac:regular rate and rhythm with normal S1, S2, and no pathological murmurs Vascular:no edema.  Normal distal pulses bilaterally Skin:warm and dry Physcologic:normal affect.  12lead WUJ:WJXBJY sinus rhythm with no acute changes Limited bedside ECHO:N/A   Patient Active Problem List  Diagnoses  . CORONARY ARTERY DISEASE, S/P PTCA  . CHRONIC RHINITIS  . EDEMA  . Encounter for long-term (current) use of anticoagulants  . History of amiodarone therapy  . Paroxysmal atrial fibrillation  . CAD (coronary artery disease)-status post anterior wall myocardial infarction with occluded LAD and left-to-right collaterals.  catheterization in 2011-no recurrent chest pain  . Hypertension  . Renal insufficiency  . Pulmonary embolism    PLAN   The patient is doing quite well.  He denies any chest pain, shortness of breath, orthopnea, PND  He remains in normal sinus rhythm on amiodarone  Coumadin is well controlled.  I asked him when he follows up with Dr. Margo Common to recheck his TSH level as his TSH level is reaching the upper limits of normal.  However, patients on amiodarone therapy often have a depressed TSH, but not an elevated TSH and this could point towards a slow progression towards hypothyroidism.  Also, there is minor increase in liver function tests which need to be monitored as the patient is on both amiodarone and lipid-lowering therapy.at this point increases not significant enough for stopping neither one of the medications.

## 2011-02-05 ENCOUNTER — Other Ambulatory Visit: Payer: Self-pay | Admitting: Cardiology

## 2011-03-16 ENCOUNTER — Ambulatory Visit (INDEPENDENT_AMBULATORY_CARE_PROVIDER_SITE_OTHER): Payer: Medicare Other | Admitting: *Deleted

## 2011-03-16 DIAGNOSIS — I4892 Unspecified atrial flutter: Secondary | ICD-10-CM

## 2011-03-16 DIAGNOSIS — Z7901 Long term (current) use of anticoagulants: Secondary | ICD-10-CM

## 2011-03-16 DIAGNOSIS — I4891 Unspecified atrial fibrillation: Secondary | ICD-10-CM

## 2011-03-16 LAB — POCT INR: INR: 2.6

## 2011-03-22 ENCOUNTER — Other Ambulatory Visit: Payer: Self-pay | Admitting: *Deleted

## 2011-03-22 MED ORDER — DILTIAZEM HCL ER COATED BEADS 300 MG PO CP24: 300.0000 mg | ORAL_CAPSULE | Freq: Every day | ORAL | Status: DC

## 2011-04-20 ENCOUNTER — Other Ambulatory Visit: Payer: Self-pay | Admitting: Family Medicine

## 2011-04-27 ENCOUNTER — Ambulatory Visit (INDEPENDENT_AMBULATORY_CARE_PROVIDER_SITE_OTHER): Payer: Medicare Other | Admitting: *Deleted

## 2011-04-27 DIAGNOSIS — Z7901 Long term (current) use of anticoagulants: Secondary | ICD-10-CM

## 2011-04-27 LAB — POCT INR: INR: 2.6

## 2011-06-08 ENCOUNTER — Ambulatory Visit (INDEPENDENT_AMBULATORY_CARE_PROVIDER_SITE_OTHER): Payer: Medicare Other | Admitting: *Deleted

## 2011-06-08 DIAGNOSIS — Z7901 Long term (current) use of anticoagulants: Secondary | ICD-10-CM

## 2011-06-08 DIAGNOSIS — I4892 Unspecified atrial flutter: Secondary | ICD-10-CM

## 2011-06-08 DIAGNOSIS — I4891 Unspecified atrial fibrillation: Secondary | ICD-10-CM

## 2011-06-08 LAB — POCT INR: INR: 2.8

## 2011-07-09 ENCOUNTER — Other Ambulatory Visit: Payer: Self-pay | Admitting: *Deleted

## 2011-07-09 MED ORDER — AMIODARONE HCL 200 MG PO TABS: 100.0000 mg | ORAL_TABLET | Freq: Every day | ORAL | Status: DC

## 2011-07-12 ENCOUNTER — Encounter (HOSPITAL_COMMUNITY)
Admission: EM | Disposition: A | Discharge status: Home / Self Care | Payer: Self-pay | Source: Other Acute Inpatient Hospital | Attending: Cardiovascular Disease

## 2011-07-12 ENCOUNTER — Encounter (HOSPITAL_COMMUNITY): Payer: Self-pay | Admitting: *Deleted

## 2011-07-12 ENCOUNTER — Inpatient Hospital Stay
Admission: EM | Admit: 2011-07-12 | Payer: Self-pay | Source: Other Acute Inpatient Hospital | Admitting: Cardiovascular Disease

## 2011-07-12 ENCOUNTER — Inpatient Hospital Stay (HOSPITAL_COMMUNITY)
Admission: EM | Admit: 2011-07-12 | Discharge: 2011-07-14 | DRG: 309 | Disposition: A | Discharge status: Home / Self Care | Payer: Medicare Other | Source: Other Acute Inpatient Hospital | Attending: Cardiovascular Disease | Admitting: Cardiovascular Disease

## 2011-07-12 DIAGNOSIS — I259 Chronic ischemic heart disease, unspecified: Secondary | ICD-10-CM

## 2011-07-12 DIAGNOSIS — I4891 Unspecified atrial fibrillation: Principal | ICD-10-CM | POA: Diagnosis present

## 2011-07-12 DIAGNOSIS — I2510 Atherosclerotic heart disease of native coronary artery without angina pectoris: Secondary | ICD-10-CM | POA: Diagnosis present

## 2011-07-12 DIAGNOSIS — N183 Chronic kidney disease, stage 3 unspecified: Secondary | ICD-10-CM | POA: Diagnosis present

## 2011-07-12 DIAGNOSIS — I252 Old myocardial infarction: Secondary | ICD-10-CM | POA: Diagnosis not present

## 2011-07-12 DIAGNOSIS — I214 Non-ST elevation (NSTEMI) myocardial infarction: Secondary | ICD-10-CM

## 2011-07-12 DIAGNOSIS — I2489 Other forms of acute ischemic heart disease: Secondary | ICD-10-CM | POA: Diagnosis present

## 2011-07-12 DIAGNOSIS — I10 Essential (primary) hypertension: Secondary | ICD-10-CM

## 2011-07-12 DIAGNOSIS — N289 Disorder of kidney and ureter, unspecified: Secondary | ICD-10-CM

## 2011-07-12 DIAGNOSIS — I248 Other forms of acute ischemic heart disease: Secondary | ICD-10-CM | POA: Diagnosis present

## 2011-07-12 DIAGNOSIS — I480 Paroxysmal atrial fibrillation: Secondary | ICD-10-CM

## 2011-07-12 HISTORY — DX: Chronic kidney disease, unspecified: N18.9

## 2011-07-12 HISTORY — DX: Gout, unspecified: M10.9

## 2011-07-12 LAB — COMPREHENSIVE METABOLIC PANEL
ALT: 69 U/L — ABNORMAL HIGH (ref 0–53)
AST: 63 U/L — ABNORMAL HIGH (ref 0–37)
Albumin: 4 g/dL (ref 3.5–5.2)
Alkaline Phosphatase: 78 U/L (ref 39–117)
BUN: 22 mg/dL (ref 6–23)
CO2: 20 mEq/L (ref 19–32)
Calcium: 9.6 mg/dL (ref 8.4–10.5)
Chloride: 102 mEq/L (ref 96–112)
Creatinine, Ser: 1.66 mg/dL — ABNORMAL HIGH (ref 0.50–1.35)
GFR calc Af Amer: 47 mL/min — ABNORMAL LOW (ref >90)
GFR calc non Af Amer: 40 mL/min — ABNORMAL LOW (ref >90)
Glucose, Bld: 98 mg/dL (ref 70–99)
Potassium: 4.8 mEq/L (ref 3.5–5.1)
Sodium: 136 mEq/L (ref 135–145)
Total Bilirubin: 0.3 mg/dL (ref 0.3–1.2)
Total Protein: 7.9 g/dL (ref 6.0–8.3)

## 2011-07-12 LAB — CARDIAC PANEL(CRET KIN+CKTOT+MB+TROPI)
CK, MB: 6.9 ng/mL (ref 0.3–4.0)
CK, MB: 7.3 ng/mL (ref 0.3–4.0)
Relative Index: 2.5 (ref 0.0–2.5)
Relative Index: 3.2 — ABNORMAL HIGH (ref 0.0–2.5)
Total CK: 274 U/L — ABNORMAL HIGH (ref 7–232)
Total CK: 231 U/L (ref 7–232)
Troponin I: <0.3 ng/mL (ref <0.30)
Troponin I: 0.54 ng/mL (ref <0.30)

## 2011-07-12 LAB — DIFFERENTIAL
Basophils Absolute: 0.1 10*3/uL (ref 0.0–0.1)
Basophils Relative: 0 % (ref 0–1)
Eosinophils Absolute: 0.1 10*3/uL (ref 0.0–0.7)
Eosinophils Relative: 1 % (ref 0–5)
Lymphocytes Relative: 22 % (ref 12–46)
Lymphs Abs: 3.2 10*3/uL (ref 0.7–4.0)
Monocytes Absolute: 1.3 10*3/uL — ABNORMAL HIGH (ref 0.1–1.0)
Monocytes Relative: 9 % (ref 3–12)
Neutro Abs: 9.5 10*3/uL — ABNORMAL HIGH (ref 1.7–7.7)
Neutrophils Relative %: 67 % (ref 43–77)

## 2011-07-12 LAB — CBC
HCT: 46 % (ref 39.0–52.0)
Hemoglobin: 15.6 g/dL (ref 13.0–17.0)
MCH: 30.7 pg (ref 26.0–34.0)
MCHC: 33.9 g/dL (ref 30.0–36.0)
MCV: 90.6 fL (ref 78.0–100.0)
Platelets: 231 10*3/uL (ref 150–400)
RBC: 5.08 MIL/uL (ref 4.22–5.81)
RDW: 14 % (ref 11.5–15.5)
WBC: 14.2 10*3/uL — ABNORMAL HIGH (ref 4.0–10.5)

## 2011-07-12 LAB — MAGNESIUM: Magnesium: 1.9 mg/dL (ref 1.5–2.5)

## 2011-07-12 LAB — MRSA PCR SCREENING: MRSA by PCR: POSITIVE — AB

## 2011-07-12 SURGERY — LEFT HEART CATHETERIZATION WITH CORONARY ANGIOGRAM
Anesthesia: LOCAL

## 2011-07-12 MED ORDER — NITROGLYCERIN 0.4 MG SL SUBL
0.4000 mg | SUBLINGUAL_TABLET | SUBLINGUAL | Status: DC | PRN
  Administered 2011-07-12 – 2011-07-13 (×2): 0.4 mg via SUBLINGUAL
  Filled 2011-07-12 (×2): qty 25

## 2011-07-12 MED ORDER — METOPROLOL TARTRATE 25 MG PO TABS
25.0000 mg | ORAL_TABLET | Freq: Two times a day (BID) | ORAL | Status: DC
  Administered 2011-07-12 – 2011-07-13 (×2): 25 mg via ORAL
  Filled 2011-07-12 (×3): qty 1

## 2011-07-12 MED ORDER — BIOTENE DRY MOUTH MT LIQD
15.0000 mL | Freq: Two times a day (BID) | OROMUCOSAL | Status: DC
  Administered 2011-07-13 – 2011-07-14 (×3): 15 mL via OROMUCOSAL

## 2011-07-12 MED ORDER — LORATADINE 10 MG PO TABS
10.0000 mg | ORAL_TABLET | Freq: Every day | ORAL | Status: DC
  Administered 2011-07-13 – 2011-07-14 (×2): 10 mg via ORAL
  Filled 2011-07-12 (×2): qty 1

## 2011-07-12 MED ORDER — DILTIAZEM HCL ER COATED BEADS 300 MG PO CP24
300.0000 mg | ORAL_CAPSULE | Freq: Every day | ORAL | Status: DC
  Administered 2011-07-12 – 2011-07-14 (×3): 300 mg via ORAL
  Filled 2011-07-12 (×3): qty 1

## 2011-07-12 MED ORDER — WARFARIN - PHYSICIAN DOSING INPATIENT: Freq: Every day | Status: DC

## 2011-07-12 MED ORDER — WARFARIN SODIUM 5 MG PO TABS
5.0000 mg | ORAL_TABLET | Freq: Every day | ORAL | Status: DC
  Administered 2011-07-12 – 2011-07-13 (×2): 5 mg via ORAL
  Filled 2011-07-12 (×3): qty 1

## 2011-07-12 MED ORDER — ACETAMINOPHEN 325 MG PO TABS
650.0000 mg | ORAL_TABLET | ORAL | Status: DC | PRN
  Administered 2011-07-13: 650 mg via ORAL
  Filled 2011-07-12: qty 2

## 2011-07-12 MED ORDER — AMIODARONE HCL IN DEXTROSE 360-4.14 MG/200ML-% IV SOLN
INTRAVENOUS | Status: AC
  Administered 2011-07-12: 60 mg/h via INTRAVENOUS
  Filled 2011-07-12: qty 200

## 2011-07-12 MED ORDER — ASPIRIN EC 325 MG PO TBEC
325.0000 mg | DELAYED_RELEASE_TABLET | Freq: Every day | ORAL | Status: DC
  Administered 2011-07-13 – 2011-07-14 (×2): 325 mg via ORAL
  Filled 2011-07-12 (×2): qty 1

## 2011-07-12 MED ORDER — CHLORHEXIDINE GLUCONATE CLOTH 2 % EX PADS
6.0000 | MEDICATED_PAD | Freq: Every day | CUTANEOUS | Status: DC
  Administered 2011-07-13 – 2011-07-14 (×2): 6 via TOPICAL

## 2011-07-12 MED ORDER — MUPIROCIN 2 % EX OINT
1.0000 "application " | TOPICAL_OINTMENT | Freq: Two times a day (BID) | CUTANEOUS | Status: DC
  Administered 2011-07-12 – 2011-07-14 (×4): 1 via NASAL
  Filled 2011-07-12 (×3): qty 22

## 2011-07-12 MED ORDER — AMIODARONE LOAD VIA INFUSION
150.0000 mg | Freq: Once | INTRAVENOUS | Status: DC
  Filled 2011-07-12: qty 83.34

## 2011-07-12 MED ORDER — ALLOPURINOL 100 MG PO TABS
100.0000 mg | ORAL_TABLET | Freq: Every day | ORAL | Status: DC
  Administered 2011-07-12 – 2011-07-13 (×2): 100 mg via ORAL
  Filled 2011-07-12 (×3): qty 1

## 2011-07-12 MED ORDER — ONDANSETRON HCL 4 MG/2ML IJ SOLN: 4.0000 mg | Freq: Four times a day (QID) | INTRAMUSCULAR | Status: DC | PRN

## 2011-07-12 MED ORDER — SODIUM CHLORIDE 0.9 % IV SOLN
INTRAVENOUS | Status: AC
  Administered 2011-07-12: 17:00:00 via INTRAVENOUS

## 2011-07-12 MED ORDER — AMIODARONE HCL IN DEXTROSE 360-4.14 MG/200ML-% IV SOLN
30.0000 mg/h | INTRAVENOUS | Status: DC
Start: 2011-07-12 — End: 2011-07-12

## 2011-07-12 MED ORDER — ATORVASTATIN CALCIUM 20 MG PO TABS
20.0000 mg | ORAL_TABLET | Freq: Every day | ORAL | Status: DC
  Administered 2011-07-12 – 2011-07-13 (×2): 20 mg via ORAL
  Filled 2011-07-12 (×3): qty 1

## 2011-07-12 MED ORDER — AMIODARONE HCL 200 MG PO TABS
200.0000 mg | ORAL_TABLET | Freq: Every day | ORAL | Status: DC
  Administered 2011-07-13: 200 mg via ORAL
  Filled 2011-07-12 (×2): qty 1

## 2011-07-12 MED ORDER — AMIODARONE HCL IN DEXTROSE 360-4.14 MG/200ML-% IV SOLN: 60.0000 mg/h | INTRAVENOUS | Status: DC

## 2011-07-12 NOTE — Progress Notes (Signed)
  CRITICAL VALUE ALERT  Critical value received:  Troponin =0.54  Date of notification:  07/12/11  Time of notification:  2344  Critical value read back:yes  Nurse who received alert:  Ruffin Pyo  MD notified (1st page):  [facility]  Time of first page:  2347  MD notified (2nd page):  Time of second page:  Responding MD:  Dr Terressa Koyanagi  Time MD responded:  [phone]

## 2011-07-12 NOTE — H&P (Signed)
See full H&P note dated 07/12/11. cdm

## 2011-07-12 NOTE — Progress Notes (Signed)
CRITICAL VALUE ALERT  Critical value received:  MRSA positive  Date of notification:  070813  Time of notification:  2208  Critical value read back:yes  Nurse who received alert:  Mayford Knife [address]  MD notified (1st page):  Protocol followed; no MD called  Time of first page:    MD notified (2nd page):  Time of second page:  Responding MD:    Time MD responded:

## 2011-07-12 NOTE — Care Management Note (Signed)
    Page 1 of 1   07/12/2011     4:38:00 PM   CARE MANAGEMENT NOTE 07/12/2011  Patient:  Gregory Davila, Gregory Davila   Account Number:  [id number]  Date Initiated:  07/12/2011  Documentation initiated by:  Junius Creamer  Subjective/Objective Assessment:   adm w at fib     Action/Plan:   lives w fam, pcp dr Onalee Hua tapper   Anticipated DC Date:     Anticipated DC Plan:        DC Planning Services  CM consult      Choice offered to / List presented to:             Status of service:   Medicare Important Message given?   (If response is "NO", the following Medicare IM given date fields will be blank) Date Medicare IM given:   Date Additional Medicare IM given:    Discharge Disposition:    Per UR Regulation:  Reviewed for med. necessity/level of care/duration of stay  If discussed at Long Length of Stay Meetings, dates discussed:    Comments:  7/8 16:40 debbie Evolette Pendell rn,bsn [phone]

## 2011-07-12 NOTE — H&P (Signed)
Gregory Davila MRN: [id number] DOB/AGE: November 03, 1940 71 y.o. Admit date: 07/12/11   Primary Care Physician: Wyvonnia Lora Primary Cardiologist: Lewayne Bunting   HPI: 71 yo WM with history of triple vessel CAD, paroxysmal atrial fibrillation on chronic coumadin therapy, HTN, chronic renal insufficiency, pulmonary embolism transferred from [facility] ED for further workup of chest pain. A code STEMI was called by the staff at [facility] based on subtle EKG changes and no old EKG for review. The patient reported mild chest tightness this am while playing golf. He completed the round and shot a 79. NO SOB. He reports missing his Lopressor for the last two days. He is known to have paroxysmal atrial fibrillation and his heart rate was 138 bpm with atrial fibrillation upon presentation to the ED.  Given Lopressor 5mg  IV x 1 in ED. He has been therapeutic for at least the last 4 months based on INR values in the computer. His last dose of coumadin was last night and INR today is  2.1.   Upon arrival to [facility], 12 lead EKG shows atrial fibrillation with RVR with rate of 103 bpm. There are non-specific ST changes but no injury current. He is chest pain free. He says he feels well. He is not aware of his heart racing. No palpitations. He has had no angina at home during normal activities or playing golf over last few months.   I have reviewed his cath films from 11/03/09. He was found to have proximal LAD occlusion with filling of the mid and distal LAD from collaterals. The RCA is small but has 70% mid stenosis. The Circumflex has diffuse 50% stenosis. He is known to have mild LV systolic dysfunction with LVEF of 45-55% by LV gram in 2011.   Review of systems complete and found to be negative unless listed above     Past Medical History   .  Paroxysmal atrial fibrillation         on Coumadin therapy .CHADS2Vas=3, on amiodarone therapy.   [name]  CAD (coronary artery disease)         .Occluded LAD with left to  right collaterals and moderate disease in the right coronary artery and circumflex.  Ejection fraction 50%.     .  Hypertension     .  Renal insufficiency       .  Pulmonary embolism       Past Surgical History:  Bilateral knee replacement Appendectomy  Family History:  Brother- MI 68s Mother-MI 30s    Social History   .  Marital Status:  Married       2 children .  Smoking status:  Former Smoker       Types:  Cigarettes       Quit date:  01/04/1966   .  Smokeless tobacco:  Former Neurosurgeon       Types:  Chew       Quit date:  01/05/2007     Comment: quit chewing tobacco about 4 yrs ago   .  Alcohol Use:  Yes         "seldomly drink beer" 1 every 2-3 weeks   .  Drug Use:  No      No Known Allergies  Prior to Admission Meds:   Medication  Sig  Dispense  Refill   .  allopurinol (ZYLOPRIM) 100 MG tablet  Take 200 mg by mouth daily.          [name]  amiodarone (PACERONE)  200 MG tablet  Take 0.5 tablets (100 mg total) by mouth daily.   45 tablet   3   .  aspirin 325 MG EC tablet  Take 325 mg by mouth daily.           .  benazepril (LOTENSIN) 20 MG tablet  Take 1 tablet by mouth Daily.         [name]  COLCRYS 0.6 MG tablet  Take 1 tablet by mouth 2 (two) times daily as needed.         .  diltiazem (CARDIZEM CD) 300 MG 24 hr capsule  Take 1 capsule (300 mg total) by mouth daily.   30 capsule   6   .  diltiazem (CARDIZEM) 60 MG tablet  Take 60 mg by mouth daily as needed.           .  loratadine (CLARITIN) 10 MG tablet  Take 10 mg by mouth daily.         .  metoprolol tartrate (LOPRESSOR) 25 MG tablet  Take 1 tablet (25 mg total) by mouth 2 (two) times daily.   60 tablet   6   .  multivitamin (THERAGRAN) per tablet  Take 1 tablet by mouth daily.           .  pravastatin (PRAVACHOL) 20 MG tablet  Take 1 tablet (20 mg total) by mouth at bedtime.   30 tablet   6   .  warfarin (COUMADIN) 5 MG tablet  Take 1 tablet daily except 1 1/2 tablets on Tuesdays or as directed   40 tablet   6      Physical  Exam:  BP 102/63   HR 103bpm  RR 12  General: Well developed, well nourished, NAD   HEENT: OP clear, mucus membranes moist   SKIN: warm, dry. No rashes.   Neuro: No focal deficits   Musculoskeletal: Muscle strength 5/5 all ext   Psychiatric: Mood and affect normal   Neck: No JVD, no carotid bruits, no thyromegaly, no lymphadenopathy.   Lungs:Clear bilaterally, no wheezes, rhonci, crackles   Cardiovascular: Irregular, tachycardic. No murmurs, gallops or rubs.   Abdomen:Soft. Bowel sounds present. Non-tender.   Extremities: No lower extremity edema. Pulses are 2 + in the bilateral DP/PT.  Labs: From [place]: creatinine: 1.69. BUN: 23.   CPK 310  CKMB  7.1   Troponin 0.05.   wBC 11.5   H/H: 16.6/50    Radiology: Chest x-ray at [facility]: cardiomegaly, no CHF, no pneumonia.   EKG:  Atrial fibrillation with RVR, 103 bpm. Old septal infarct. Non-specific ST segment abnormalities.   ASSESSMENT AND PLAN:   1. Chest pain: Likely secondary to rapid ventricular rate with demand ischemia secondary to underlying CAD with RVR. Will cycle cardiac markers. I would expect there to be a slight increase in troponin given his RVR with CAD and chest pain. Chest pain resolved with rate control. EKG without ischemic changes.   2. CAD: As documented above. Pt with severe CAD. Continue medical management. Cycle cardiac markers.   3. Atrial fibrillation: Will start IV amiodarone for rate control and continue his Cardizem and Lopressor po. He has been therapeutic on coumadin for months. If he does not convert to sinus overnight, will pursue cardioversion tomorrow. Will start heparin drip when INR is less than 2.0. Will hold coumadin tonight but if there is no clear evidence of ischemia by tomorrow and no plans for invasive workup, will resume in am.  Rexford Prevo 4:10 PM 07/12/2011

## 2011-07-13 ENCOUNTER — Encounter (HOSPITAL_COMMUNITY): Payer: Self-pay | Admitting: *Deleted

## 2011-07-13 DIAGNOSIS — I252 Old myocardial infarction: Secondary | ICD-10-CM | POA: Diagnosis not present

## 2011-07-13 DIAGNOSIS — I214 Non-ST elevation (NSTEMI) myocardial infarction: Secondary | ICD-10-CM

## 2011-07-13 DIAGNOSIS — I10 Essential (primary) hypertension: Secondary | ICD-10-CM

## 2011-07-13 DIAGNOSIS — N289 Disorder of kidney and ureter, unspecified: Secondary | ICD-10-CM

## 2011-07-13 LAB — CBC
HCT: 45.1 % (ref 39.0–52.0)
Hemoglobin: 14.9 g/dL (ref 13.0–17.0)
MCH: 30.3 pg (ref 26.0–34.0)
MCHC: 33 g/dL (ref 30.0–36.0)
MCV: 91.7 fL (ref 78.0–100.0)
Platelets: 219 10*3/uL (ref 150–400)
RBC: 4.92 MIL/uL (ref 4.22–5.81)
RDW: 14.5 % (ref 11.5–15.5)
WBC: 9 10*3/uL (ref 4.0–10.5)

## 2011-07-13 LAB — LIPID PANEL
Cholesterol: 182 mg/dL (ref 0–200)
HDL: 38 mg/dL — ABNORMAL LOW (ref >39)
LDL Cholesterol: 104 mg/dL — ABNORMAL HIGH (ref 0–99)
Total CHOL/HDL Ratio: 4.8 RATIO
Triglycerides: 198 mg/dL — ABNORMAL HIGH (ref <150)
VLDL: 40 mg/dL (ref 0–40)

## 2011-07-13 LAB — BASIC METABOLIC PANEL
BUN: 22 mg/dL (ref 6–23)
CO2: 22 mEq/L (ref 19–32)
Calcium: 9.3 mg/dL (ref 8.4–10.5)
Chloride: 101 mEq/L (ref 96–112)
Creatinine, Ser: 1.72 mg/dL — ABNORMAL HIGH (ref 0.50–1.35)
GFR calc Af Amer: 45 mL/min — ABNORMAL LOW (ref >90)
GFR calc non Af Amer: 39 mL/min — ABNORMAL LOW (ref >90)
Glucose, Bld: 103 mg/dL — ABNORMAL HIGH (ref 70–99)
Potassium: 4.3 mEq/L (ref 3.5–5.1)
Sodium: 135 mEq/L (ref 135–145)

## 2011-07-13 LAB — TSH: TSH: 4.358 u[IU]/mL (ref 0.350–4.500)

## 2011-07-13 LAB — PROTIME-INR
INR: 2.27 — ABNORMAL HIGH (ref 0.00–1.49)
Prothrombin Time: 25.4 seconds — ABNORMAL HIGH (ref 11.6–15.2)

## 2011-07-13 LAB — CARDIAC PANEL(CRET KIN+CKTOT+MB+TROPI)
CK, MB: 7.1 ng/mL (ref 0.3–4.0)
Relative Index: 3.4 — ABNORMAL HIGH (ref 0.0–2.5)
Total CK: 206 U/L (ref 7–232)
Troponin I: 0.46 ng/mL (ref <0.30)

## 2011-07-13 MED ORDER — ZOLPIDEM TARTRATE 5 MG PO TABS
5.0000 mg | ORAL_TABLET | Freq: Every evening | ORAL | Status: DC | PRN
  Administered 2011-07-13: 5 mg via ORAL
  Filled 2011-07-13: qty 1

## 2011-07-13 NOTE — Progress Notes (Signed)
[facility] PA was paged by the nurse @ request of the patient to get an order for sleep med. Mrs/Ms Hope PA instructed the nurse to place an order for Ambien 5 mg @ bedtime PRN for sleep. Nurse performed as was instructed. Harmon Pier

## 2011-07-13 NOTE — Progress Notes (Signed)
SUBJECTIVE: 2 episodes of very mild chest discomfort overnight that only lasted for one minute. No chest pain this am. No SOB.   BP 124/70  Pulse 64  Temp 98.8 F (37.1 C) (Oral)  Resp 12  Ht 6' (1.829 m)  Wt 241 lb 6.5 oz (109.5 kg)  BMI 32.74 kg/m2  SpO2 95%  Intake/Output Summary (Last 24 hours) at 07/13/11 0714 Last data filed at 07/13/11 0600  Gross per 24 hour  Intake    920 ml  Output   1400 ml  Net   -480 ml    PHYSICAL EXAM General: Well developed, well nourished, in no acute distress. Alert and oriented x 3.  Psych:  Good affect, responds appropriately Neck: No JVD. No masses noted.  Lungs: Clear bilaterally with no wheezes or rhonci noted.  Heart: RRR with no murmurs noted. Abdomen: Bowel sounds are present. Soft, non-tender.  Extremities: No lower extremity edema.   LABS: Basic Metabolic Panel:  Basename 07/13/11 0334 07/12/11 1654  NA 135 136  K 4.3 4.8  CL 101 102  CO2 22 20  GLUCOSE 103* 98  BUN 22 22  CREATININE 1.72* 1.66*  CALCIUM 9.3 9.6  MG -- 1.9  PHOS -- --   CBC:  Basename 07/13/11 0334 07/12/11 1654  WBC 9.0 14.2*  NEUTROABS -- 9.5*  HGB 14.9 15.6  HCT 45.1 46.0  MCV 91.7 90.6  PLT 219 231   Cardiac Enzymes:  Basename 07/13/11 0334 07/12/11 2204 07/12/11 1655  CKTOTAL 206 231 274*  CKMB 7.1* 7.3* 6.9*  CKMBINDEX -- -- --  TROPONINI 0.46* 0.54* <0.30   Fasting Lipid Panel:  Basename 07/13/11 0334  CHOL 182  HDL 38*  LDLCALC 104*  TRIG 198*  CHOLHDL 4.8  LDLDIRECT --    Current Meds:    . allopurinol  100 mg Oral QHS  . amiodarone (NEXTERONE PREMIX) 360 mg/200 mL dextrose      . amiodarone  200 mg Oral Daily  . antiseptic oral rinse  15 mL Mouth Rinse BID  . aspirin  325 mg Oral Daily  . atorvastatin  20 mg Oral q1800  . Chlorhexidine Gluconate Cloth  6 each Topical Q0600  . diltiazem  300 mg Oral Daily  . loratadine  10 mg Oral Daily  . metoprolol tartrate  25 mg Oral BID  . mupirocin ointment  1  application Nasal BID  . warfarin  5 mg Oral q1800  . Warfarin - Physician Dosing Inpatient   Does not apply q1800  . DISCONTD: amiodarone  150 mg Intravenous Once     ASSESSMENT AND PLAN:  1. Atrial fibrillation with RVR: Converted to NSR yesterday after starting IV amiodarone. He is on chronic coumadin therapy with INR of 2.27 this am. Will continue outpatient therapy including amiodarone po, Cardizem and Lopressor. I have increased his po amiodarone dose to 200 mg per day.   2. NSTEMI: In setting of rapid HR with underlying CAD (50% Circumflex stenosis, 70% stenosis in small RCA and occluded LAD that fills via collaterals). Peak troponin of 0.54 with mildly elevated CK level, both now trending down after rate is controlled. Given his underlying CAD, it is not surprising that he would bump his cardiac enzymes while in rapid atrial fibrillation. He will need a stress myoview, likely as an outpatient to exclude ischemia. He is not a favorable candidate for cardiac cath at this time with elevated INR and chronic renal insufficiency. Will ambulate today and if  he has recurrent chest pain, will have to consider inpatient myoview vs holding coumadin for several days to plan cardiac cath.  3. CAD: As above.   4. Chronic kidney disease, stage 3: At baseline. Will follow.    5. Dispo: Transfer to telemetry today. Ambulate. Hopeful d/c home in am on 07/14/11.   Byrdie Miyazaki  7/9/20137:14 AM

## 2011-07-13 NOTE — Progress Notes (Signed)
Patient's heart rate dropping into low 30's per monitor. Patient states feeling "woozy". B/P 94/60, Ra 95%, EkG assessed junctional bradycardia @44 . Patient instructed to stay in bed. PA Hurman Horn notified. No new orders given. PA to come assess patient.   Gregory Davila [phone]

## 2011-07-13 NOTE — Progress Notes (Signed)
Called by patient's RN regarding junctional bradycardia with HR to 30s, labile BP (SBP 94) and patient endorsing mild lightheadedness. On further review, junctional bradycardia with intermittent sinus bradycardia (HR 41 at lowest) appreciated on telemetry. Will hold Lopressor and enter hold parameters for Cardizem. Likely secondary to aggressive rate-control once patient converted to NSR on amiodarone. Patient currently asymptomatic. Will continue to monitor.    Jacqulyn Bath, PA-C 07/13/2011 2:56 PM

## 2011-07-14 ENCOUNTER — Encounter (HOSPITAL_COMMUNITY): Payer: Self-pay | Admitting: Physician Assistant

## 2011-07-14 LAB — CBC
HCT: 47.2 % (ref 39.0–52.0)
Hemoglobin: 15.9 g/dL (ref 13.0–17.0)
MCH: 30.7 pg (ref 26.0–34.0)
MCHC: 33.7 g/dL (ref 30.0–36.0)
MCV: 91.1 fL (ref 78.0–100.0)
Platelets: 229 10*3/uL (ref 150–400)
RBC: 5.18 MIL/uL (ref 4.22–5.81)
RDW: 14.2 % (ref 11.5–15.5)
WBC: 10.9 10*3/uL — ABNORMAL HIGH (ref 4.0–10.5)

## 2011-07-14 LAB — BASIC METABOLIC PANEL
BUN: 25 mg/dL — ABNORMAL HIGH (ref 6–23)
CO2: 21 mEq/L (ref 19–32)
Calcium: 9.4 mg/dL (ref 8.4–10.5)
Chloride: 100 mEq/L (ref 96–112)
Creatinine, Ser: 1.73 mg/dL — ABNORMAL HIGH (ref 0.50–1.35)
GFR calc Af Amer: 44 mL/min — ABNORMAL LOW (ref >90)
GFR calc non Af Amer: 38 mL/min — ABNORMAL LOW (ref >90)
Glucose, Bld: 115 mg/dL — ABNORMAL HIGH (ref 70–99)
Potassium: 4.3 mEq/L (ref 3.5–5.1)
Sodium: 135 mEq/L (ref 135–145)

## 2011-07-14 LAB — PROTIME-INR
INR: 2.3 — ABNORMAL HIGH (ref 0.00–1.49)
Prothrombin Time: 25.7 seconds — ABNORMAL HIGH (ref 11.6–15.2)

## 2011-07-14 MED ORDER — AMIODARONE HCL 100 MG PO TABS
100.0000 mg | ORAL_TABLET | Freq: Every day | ORAL | Status: DC
Start: 2011-07-14 — End: 2011-07-14
  Administered 2011-07-14: 100 mg via ORAL
  Filled 2011-07-14: qty 1

## 2011-07-14 MED ORDER — METOPROLOL TARTRATE 25 MG PO TABS: 12.5000 mg | ORAL_TABLET | Freq: Two times a day (BID) | ORAL | Status: DC

## 2011-07-14 MED ORDER — METOPROLOL TARTRATE 12.5 MG HALF TABLET
12.5000 mg | ORAL_TABLET | Freq: Two times a day (BID) | ORAL | Status: DC
Start: 2011-07-14 — End: 2011-07-14
  Administered 2011-07-14: 12.5 mg via ORAL
  Filled 2011-07-14 (×2): qty 1

## 2011-07-14 MED ORDER — ASPIRIN 81 MG PO TBEC: 81.0000 mg | DELAYED_RELEASE_TABLET | Freq: Every day | ORAL | Status: DC

## 2011-07-14 MED ORDER — NITROGLYCERIN 0.4 MG SL SUBL: 0.4000 mg | SUBLINGUAL_TABLET | SUBLINGUAL | Status: DC | PRN

## 2011-07-14 NOTE — Progress Notes (Signed)
Pt. Discharged 07/14/2011  11:39 AM Discharge instructions reviewed with patient/family. Patient/family verbalized understanding. All Rx's given. Questions answered as needed. Pt. Discharged to home with family/self.  Elease Hashimoto

## 2011-07-14 NOTE — Discharge Summary (Signed)
Discharge Summary   Patient ID: Gregory Davila MRN: [id number], DOB/AGE: 70-Dec-1942 70 y.o. Admit date: 07/12/2011 D/C date:     07/14/2011  Primary Cardiologist: Dr. Andee Lineman [facility])  Primary Discharge Diagnoses:  1. Paroxysmal atrial fibrillation with RVR - junctional bradycardia this admission so BB reduced 2. CAD (coronary artery disease)  - NSTEMI this admission likely secondary to demand ischemia, for OP myoview  - previous hx by 2011 cath: occluded LAD with left to right collaterals and moderate disease in the right coronary artery and circumflex, EF 50%  Secondary Discharge Diagnoses:  1. Hypertension  2. Chronic renal insufficiency  3. H/o pulmonary embolism   Hospital Course: 71 yo WM with history of triple vessel CAD, paroxysmal atrial fibrillation on chronic coumadin therapy, HTN, chronic renal insufficiency, pulmonary embolism transferred from [facility] ED for further workup of chest pain. The patient reported mild chest tightness the morning of admission while playing golf. He completed the round and shot a 79. He had missed his lopressor for 2 days. In the ER, he was in afib at 138bpm and was given Lopressor 5mg  IV x 1. INR was documented as therapeutic for at least the last 4 months.  A code STEMI was initially called by the staff at [facility] based on subtle EKG changes and no old EKG for review. Dr. Clifton James reviewed this EKG which showed nonspecific ST changes but no current injury so STEMI was called off. He was chest pain free upon arrival to [facility] with improved HR to 103. He was started on IV amiodarone and converted to NSR; this was changed to po amiodarone. His CP was felt most likely secondary to RVR with demand ischemia in the setting of underlying known 3V dz, and Dr. Clifton James felt that elevated troponins would be expected - this was in fact the case with peak troponin of 0.54. The was felt to be an unfavorable candidate for cath given elevated INR and CRI and thus  myoview is planned. TSH was WNL. His benazepril was held this admission because of hypotension. The patient did develop junctional bradycardia yesterday and Lopressor was held, with subsequent decrease in dose. He was on 200mg  of amiodarone as an inpatient, but due to this bradycardia he was changed back to 100mg  daily at discharge. Dr. Clifton James has seen and examined the patient today and feels he is stable for discharge. Per discussion, we will also drop ASA to 81mg  daily.  Discharge Vitals: Blood pressure 107/72, pulse 72, temperature 97 F (36.1 C), temperature source Oral, resp. rate 18, height 6' (1.829 m), weight 239 lb 3.2 oz (108.5 kg), SpO2 92.00%.  Labs: Lab Results  Component Value Date   WBC 10.9* 07/14/2011   HGB 15.9 07/14/2011   HCT 47.2 07/14/2011   MCV 91.1 07/14/2011   PLT 229 07/14/2011     Lab 07/14/11 0615 07/12/11 1654  NA 135 --  K 4.3 --  CL 100 --  CO2 21 --  BUN 25* --  CREATININE 1.73* --  CALCIUM 9.4 --  PROT -- 7.9  BILITOT -- 0.3  ALKPHOS -- 78  ALT -- 69*  AST -- 63*  GLUCOSE 115* --    Basename 07/13/11 0334 07/12/11 2204 07/12/11 1655  CKTOTAL 206 231 274*  CKMB 7.1* 7.3* 6.9*  TROPONINI 0.46* 0.54* <0.30   Lab Results  Component Value Date   CHOL 182 07/13/2011   HDL 38* 07/13/2011   LDLCALC 104* 07/13/2011   TRIG 198* 07/13/2011  Diagnostic Studies/Procedures   No results found.  Discharge Medications   Medication List  As of 07/14/2011  8:55 AM   STOP taking these medications         benazepril 20 MG tablet         TAKE these medications         allopurinol 100 MG tablet   Commonly known as: ZYLOPRIM   Take 100 mg by mouth at bedtime.      amiodarone 200 MG tablet   Commonly known as: PACERONE   Take 0.5 tablets (100 mg total) by mouth daily.      aspirin 81 MG EC tablet   Take 1 tablet (81 mg total) by mouth daily.      COLCRYS 0.6 MG tablet   Generic drug: colchicine   Take 1 tablet by mouth 2 (two) times daily as needed.  For gout      diltiazem 300 MG 24 hr capsule   Commonly known as: CARDIZEM CD   Take 1 capsule (300 mg total) by mouth daily.      loratadine 10 MG tablet   Commonly known as: CLARITIN   Take 10 mg by mouth daily.      metoprolol tartrate 25 MG tablet   Commonly known as: LOPRESSOR   Take 0.5 tablets (12.5 mg total) by mouth 2 (two) times daily.      multivitamin per tablet   Take 1 tablet by mouth daily.      nitroGLYCERIN 0.4 MG SL tablet   Commonly known as: NITROSTAT   Place 1 tablet (0.4 mg total) under the tongue every 5 (five) minutes x 3 doses as needed for chest pain.      pravastatin 20 MG tablet   Commonly known as: PRAVACHOL   Take 1 tablet (20 mg total) by mouth at bedtime.      warfarin 5 MG tablet   Commonly known as: COUMADIN   Take 1 tablet daily except 1 1/2 tablets on Tuesdays or as directed          Note: benazepril was stopped because of hypotension. BB was reduced due to junctional bradycardia. ASA was reduced given concomitant Coumadin.  Disposition   The patient will be discharged in stable condition to home. Discharge Orders    Future Appointments: Provider: Department: Dept Phone: Center:   07/20/2011 8:30 AM [facility] [phone] [facility]   07/26/2011 10:10 AM Beatrice Lecher, PA [facility] [phone] [facility]     Future Orders Please Complete By Expires   Diet - low sodium heart healthy      Increase activity slowly      Comments:   No driving for 1 week. No heavy lifting over 10 lbs for 2 weeks. No sexual activity for 2 weeks.     Follow-up Information    Follow up with [facility]. ([facility] - 07/20/11 at 8:30am)    Contact information:   [facility] [address] [place] [address] [phone]      Follow up with Tereso Newcomer, PA. (07/26/11 at 10am - Due to scheduling changes, your post-hopsital follow-up appointment will be with Tereso Newcomer  PA-C in our [place] office. After this appointment, you will continue to follow in [place]. We apologize for the inconvenience.)    Contact information:    OFFICE [address] [place] [address] [phone]       Follow  up with [facility]. (Dr. Margarita Mail office will call you to schedule your stress test and will give you instructions at that time)            Duration of Discharge Encounter: Greater than 30 minutes including physician and PA time.  Signed, Ronie Spies PA-C 07/14/2011, 8:55 AM

## 2011-07-14 NOTE — Progress Notes (Signed)
SUBJECTIVE: No chest pain or SoB this am. Dizziness with bradycardia yesterday. Feels better this am.   BP 107/72  Pulse 72  Temp 97 F (36.1 C) (Oral)  Resp 18  Ht 6' (1.829 m)  Wt 239 lb 3.2 oz (108.5 kg)  BMI 32.44 kg/m2  SpO2 92%  Intake/Output Summary (Last 24 hours) at 07/14/11 0706 Last data filed at 07/13/11 1100  Gross per 24 hour  Intake    580 ml  Output    600 ml  Net    -20 ml    PHYSICAL EXAM General: Well developed, well nourished, in no acute distress. Alert and oriented x 3.  Psych:  Good affect, responds appropriately Neck: No JVD. No masses noted.  Lungs: Clear bilaterally with no wheezes or rhonci noted.  Heart: RRR with no murmurs noted. Abdomen: Bowel sounds are present. Soft, non-tender.  Extremities: No lower extremity edema.   LABS: Basic Metabolic Panel:  Basename 07/13/11 0334 07/12/11 1654  NA 135 136  K 4.3 4.8  CL 101 102  CO2 22 20  GLUCOSE 103* 98  BUN 22 22  CREATININE 1.72* 1.66*  CALCIUM 9.3 9.6  MG -- 1.9  PHOS -- --   CBC:  Basename 07/14/11 0615 07/13/11 0334 07/12/11 1654  WBC 10.9* 9.0 --  NEUTROABS -- -- 9.5*  HGB 15.9 14.9 --  HCT 47.2 45.1 --  MCV 91.1 91.7 --  PLT 229 219 --   Cardiac Enzymes:  Basename 07/13/11 0334 07/12/11 2204 07/12/11 1655  CKTOTAL 206 231 274*  CKMB 7.1* 7.3* 6.9*  CKMBINDEX -- -- --  TROPONINI 0.46* 0.54* <0.30   Fasting Lipid Panel:  Basename 07/13/11 0334  CHOL 182  HDL 38*  LDLCALC 104*  TRIG 198*  CHOLHDL 4.8  LDLDIRECT --    Current Meds:    . allopurinol  100 mg Oral QHS  . amiodarone  200 mg Oral Daily  . antiseptic oral rinse  15 mL Mouth Rinse BID  . aspirin  325 mg Oral Daily  . atorvastatin  20 mg Oral q1800  . Chlorhexidine Gluconate Cloth  6 each Topical Q0600  . diltiazem  300 mg Oral Daily  . loratadine  10 mg Oral Daily  . mupirocin ointment  1 application Nasal BID  . warfarin  5 mg Oral q1800  . Warfarin - Physician Dosing Inpatient   Does  not apply q1800  . DISCONTD: metoprolol tartrate  25 mg Oral BID     ASSESSMENT AND PLAN:  1. Atrial fibrillation with RVR: Converted to NSR on the day of admission after starting IV amiodarone. He is on chronic coumadin therapy with therapeutic INR. He did have bradycardia yesterday so his Lopressor was held. Will continue outpatient therapy including amiodarone po but will lower back down to 100 mg po Qdaily. Will continue  Cardizem and Lopressor but will decrease Lopressor to 12.5 mg po BID. I had increased his po amiodarone dose to 200 mg per day on admission in an attempt to keep him in NSR but given bradycardia yesterday, will resume home dose of 100 mg once daily.  2. NSTEMI: In setting of rapid HR with underlying CAD (50% Circumflex stenosis, 70% stenosis in small RCA and occluded LAD that fills via collaterals). Peak troponin of 0.54 with mildly elevated CK level, both trended down. Given his underlying CAD, it is not surprising that he would bump his cardiac enzymes while in rapid atrial fibrillation. He will need a  stress myoview, likely as an outpatient to exclude ischemia. He is not a favorable candidate for cardiac cath at this time with elevated INR and chronic renal insufficiency. No recurrent chest pain overnight.   3. CAD: As above.   4. Chronic kidney disease, stage 3: At baseline. Will follow.   5. Dispo: D/C home today on same home meds but lower dose of Lopressor 12.5 mg po BID. Check BMET before discharge. F/U [facility] or [facility] office in 1-2 weeks.     MCALHANY,Gregory Davila  7/10/20137:06 AM

## 2011-07-14 NOTE — Discharge Summary (Signed)
See full note this am. cdm 

## 2011-07-16 ENCOUNTER — Other Ambulatory Visit: Payer: Self-pay | Admitting: Physician Assistant

## 2011-07-16 DIAGNOSIS — I259 Chronic ischemic heart disease, unspecified: Secondary | ICD-10-CM

## 2011-07-16 DIAGNOSIS — I4891 Unspecified atrial fibrillation: Secondary | ICD-10-CM

## 2011-07-16 DIAGNOSIS — I214 Non-ST elevation (NSTEMI) myocardial infarction: Secondary | ICD-10-CM

## 2011-07-20 ENCOUNTER — Ambulatory Visit (INDEPENDENT_AMBULATORY_CARE_PROVIDER_SITE_OTHER): Payer: Medicare Other | Admitting: *Deleted

## 2011-07-20 DIAGNOSIS — Z7901 Long term (current) use of anticoagulants: Secondary | ICD-10-CM

## 2011-07-20 DIAGNOSIS — I4892 Unspecified atrial flutter: Secondary | ICD-10-CM

## 2011-07-20 DIAGNOSIS — I4891 Unspecified atrial fibrillation: Secondary | ICD-10-CM

## 2011-07-20 LAB — POCT INR: INR: 2.1

## 2011-07-21 DIAGNOSIS — I214 Non-ST elevation (NSTEMI) myocardial infarction: Secondary | ICD-10-CM

## 2011-07-26 ENCOUNTER — Encounter: Payer: Self-pay | Admitting: Physician Assistant

## 2011-07-26 ENCOUNTER — Encounter: Payer: Self-pay | Admitting: *Deleted

## 2011-07-26 ENCOUNTER — Ambulatory Visit (INDEPENDENT_AMBULATORY_CARE_PROVIDER_SITE_OTHER): Payer: Medicare Other | Admitting: Physician Assistant

## 2011-07-26 VITALS — BP 130/68 | HR 66 | Ht 72.0 in | Wt 240.0 lb

## 2011-07-26 DIAGNOSIS — R9439 Abnormal result of other cardiovascular function study: Secondary | ICD-10-CM

## 2011-07-26 DIAGNOSIS — Z01818 Encounter for other preprocedural examination: Secondary | ICD-10-CM

## 2011-07-26 DIAGNOSIS — Z7901 Long term (current) use of anticoagulants: Secondary | ICD-10-CM

## 2011-07-26 DIAGNOSIS — I4891 Unspecified atrial fibrillation: Secondary | ICD-10-CM

## 2011-07-26 DIAGNOSIS — I2510 Atherosclerotic heart disease of native coronary artery without angina pectoris: Secondary | ICD-10-CM

## 2011-07-26 NOTE — Patient Instructions (Addendum)
Your physician has requested that you have a cardiac catheterization. Cardiac catheterization is used to diagnose and/or treat various heart conditions. Doctors may recommend this procedure for a number of different reasons. The most common reason is to evaluate chest pain. Chest pain can be a symptom of coronary artery disease (CAD), and cardiac catheterization can show whether plaque is narrowing or blocking your heart's arteries. This procedure is also used to evaluate the valves, as well as measure the blood flow and oxygen levels in different parts of your heart. For further information please visit [email or web address] Please follow instruction sheet, as given. This has been scheduled for 8:30 Tuesday, July 30.  Please arrive at 7:30 am for pre-procedure details.      Your physician recommends that you return for lab work Friday 07/30/2011 for BMET, CBC, INR/PT   HOLD COUMADIN Saturday, Sunday, AND Monday before your procedure.

## 2011-07-26 NOTE — Progress Notes (Signed)
[address] [place], [place]  [address] Phone: [phone] Fax:  [phone]  Date:  07/26/2011   Name:  Gregory Davila   DOB:  01/30/1940   MRN:  [id number]  PCP:  [name], MD  Primary Cardiologist:  Dr. Lewayne Bunting [facility]) Primary Electrophysiologist:  None    History of Present Illness: Gregory Davila is a 71 y.o. male who returns for post hospital follow up.  He has a history of CAD, paroxysmal atrial fibrillation, HTN, CKD, prior pulmonary embolism after knee surgery 10/2009.  He also suffered a NSTEMI in the setting of his pulmonary embolism in 10/2009.  LHC at that time: LAD occluded after the D1, mid and distal LAD filled by left to left and right-to-left collaterals, mid circumflex 30-50%, ostial RV marginal branch 80%, mid RCA diffuse 70% (small in caliber).  Medical therapy was pursued.    He was admitted 7/8-7/10 with a NSTEMI in the setting of atrial fibrillation with RVR.  He initially presented to the [facility] emergency room with chest pain.  His heart rate was 138 in atrial fibrillation and his ECG was concerning for STEMI.  He was transported emergently to [facility].  CODE STEMI was called off as his ECG was not consistent with this.  He did essentially rule in for NSTEMI with peak troponin of 0.54.  This was thought to be related to demand ischemia.  He was not felt to be a favorable candidate for LHC with elevated INR and chronic kidney disease.  He did have some junctional bradycardia.  His beta blocker and amiodarone were both adjusted prior to discharge.  Since discharge, he has had a Lexiscan Myoview done at [facility].  I was just able to pull up those results.  His Myoview is abnormal with scar involving the apex, primarily anteroseptal wall and to some degree the apical inferior wall, EF 43%, TID ratio 1.21.  The study in is felt to be consistent with underlying CAD, infarct in the anteroseptal/periapical  distribution with moderate peri-infarct ischemia, possible even component of balanced multivessel ischemia with mildly increased TID ratio.  Since discharge, the patient is doing well.  He is tired.  The patient denies chest pain, shortness of breath, syncope, orthopnea, PND or significant pedal edema.    Potassium  Date/Time Value Range Status  07/14/2011  6:15 AM 4.3  3.5 - 5.1 mEq/L Final     Creatinine, Ser  Date/Time Value Range Status  07/14/2011  6:15 AM 1.73* 0.50 - 1.35 mg/dL Final     Past Medical History  Diagnosis Date  . Paroxysmal atrial fibrillation     a. On Coumadin therapy/amidarone. b. 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  [name] CAD (coronary artery disease)     a, 2011: occluded LAD with left to right collaterals and moderate disease in the right coronary artery and circumflex, EF 50%. b. NSTEMI 07/2011 in setting of AF-RVR, for OP myoview  . Hypertension     long-standing  . Chronic renal insufficiency     followed by primary care physician  . Pulmonary embolism   . Gout     Current Outpatient Prescriptions  Medication Sig Dispense Refill  . allopurinol (ZYLOPRIM) 100 MG tablet Take 100 mg by mouth at bedtime.       [name] amiodarone (PACERONE) 200 MG tablet Take 0.5 tablets (100 mg total) by mouth daily.  45 tablet  3  . aspirin 81  MG EC tablet Take 1 tablet (81 mg total) by mouth daily.      [name] COLCRYS 0.6 MG tablet Take 1 tablet by mouth 2 (two) times daily as needed. For gout      . diltiazem (CARDIZEM CD) 300 MG 24 hr capsule Take 1 capsule (300 mg total) by mouth daily.  30 capsule  6  . loratadine (CLARITIN) 10 MG tablet Take 10 mg by mouth daily.      . metoprolol tartrate (LOPRESSOR) 25 MG tablet Take 0.5 tablets (12.5 mg total) by mouth 2 (two) times daily.      . multivitamin (THERAGRAN) per tablet Take 1 tablet by mouth daily.        . nitroGLYCERIN (NITROSTAT) 0.4 MG SL tablet Place 1 tablet (0.4 mg total) under the tongue  every 5 (five) minutes x 3 doses as needed for chest pain.  25 tablet  4  . pravastatin (PRAVACHOL) 20 MG tablet Take 1 tablet (20 mg total) by mouth at bedtime.  30 tablet  6  . warfarin (COUMADIN) 5 MG tablet Take 1 tablet daily except 1 1/2 tablets on Tuesdays or as directed  40 tablet  6    Allergies: No Known Allergies  History  Substance Use Topics  . Smoking status: Former Smoker    Types: Cigarettes    Quit date: 01/04/1966  . Smokeless tobacco: Former Neurosurgeon    Types: Chew    Quit date: 01/05/2007   Comment: quit chewing tobacco about 4 yrs ago  . Alcohol Use: Yes     "seldomly drink beer" 1 every 2-3 weeks    Family History  Problem Relation Age of Onset  . Coronary artery disease Brother     MI     ROS:  Please see the history of present illness.     All other systems reviewed and negative.   PHYSICAL EXAM: VS:  BP 130/68  Pulse 66  Ht 6' (1.829 m)  Wt 240 lb (108.863 kg)  BMI 32.55 kg/m2 Well nourished, well developed, in no acute distress HEENT: normal Neck: no JVD Cardiac:  normal S1, S2; RRR; 1/6 systolic ejection murmur heard best at the RUSB Lungs:  clear to auscultation bilaterally, no wheezing, rhonchi or rales Abd: soft, nontender, no hepatomegaly Ext: no edema Skin: warm and dry Neuro:  CNs 2-12 intact, no focal abnormalities noted  EKG:  Sinus rhythm, heart rate 66, inferior Q waves, septal Q waves, no ischemic changes      ASSESSMENT AND PLAN:  1.  CAD He had a NSTEMI in the setting of atrial fibrillation with RVR.  This was felt to be demand ischemia.  However, his stress perfusion study is significantly abnormal.  I have reviewed this today with Dr. Excell Seltzer (DOD).  After review of his records, we have decided that it is best to proceed with re-look catheterization.  This will be arranged in the outpatient lab with Dr. Excell Seltzer. Risks and benefits of cardiac catheterization have been discussed with the patient.  These include bleeding, infection,  kidney damage, stroke, heart attack, death.  The patient understands these risks and is willing to proceed.   2.  Atrial fibrillation Maintaining sinus rhythm.  We will hold his Coumadin 3 days prior to his cardiac catheterization.  3.  Pulmonary embolism This occurred in 2011 after knee surgery.  It is acceptable for him to hold his Coumadin for cardiac catheterization.  4.  Chronic kidney disease Minimal dye load will be used  for cardiac catheterization.  We discussed the potential for contrast-induced nephropathy.  5.  Hypertension Controlled.  Continue current therapy.   Luna Glasgow, PA-C  5:34 PM 07/26/2011

## 2011-07-26 NOTE — H&P (Signed)
History and Physical   Date:  07/26/2011   Name:  Gregory Davila   DOB:  Aug 04, 1940   MRN:  [id number]  PCP:  [name], MD  Primary Cardiologist:  Dr. Lewayne Bunting [facility]) Primary Electrophysiologist:  None    History of Present Illness: Gregory Davila is a 71 y.o. male who returns for post hospital follow up.  He has a history of CAD, paroxysmal atrial fibrillation, HTN, CKD, prior pulmonary embolism after knee surgery 10/2009.  He also suffered a NSTEMI in the setting of his pulmonary embolism in 10/2009.  LHC at that time: LAD occluded after the D1, mid and distal LAD filled by left to left and right-to-left collaterals, mid circumflex 30-50%, ostial RV marginal branch 80%, mid RCA diffuse 70% (small in caliber).  Medical therapy was pursued.    He was admitted 7/8-7/10 with a NSTEMI in the setting of atrial fibrillation with RVR.  He initially presented to the [facility] emergency room with chest pain.  His heart rate was 138 in atrial fibrillation and his ECG was concerning for STEMI.  He was transported emergently to [facility].  CODE STEMI was called off as his ECG was not consistent with this.  He did essentially rule in for NSTEMI with peak troponin of 0.54.  This was thought to be related to demand ischemia.  He was not felt to be a favorable candidate for LHC with elevated INR and chronic kidney disease.  He did have some junctional bradycardia.  His beta blocker and amiodarone were both adjusted prior to discharge.  Since discharge, he has had a Lexiscan Myoview done at [facility].  I was just able to pull up those results.  His Myoview is abnormal with scar involving the apex, primarily anteroseptal wall and to some degree the apical inferior wall, EF 43%, TID ratio 1.21.  The study in is felt to be consistent with underlying CAD, infarct in the anteroseptal/periapical distribution with moderate peri-infarct ischemia, possible even component of balanced multivessel  ischemia with mildly increased TID ratio.  Since discharge, the patient is doing well.  He is tired.  The patient denies chest pain, shortness of breath, syncope, orthopnea, PND or significant pedal edema.    Potassium  Date/Time Value Range Status  07/14/2011  6:15 AM 4.3  3.5 - 5.1 mEq/L Final     Creatinine, Ser  Date/Time Value Range Status  07/14/2011  6:15 AM 1.73* 0.50 - 1.35 mg/dL Final     Past Medical History  Diagnosis Date  . Paroxysmal atrial fibrillation     a. On Coumadin therapy/amidarone. b. 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  [name] CAD (coronary artery disease)     a, 2011: occluded LAD with left to right collaterals and moderate disease in the right coronary artery and circumflex, EF 50%. b. NSTEMI 07/2011 in setting of AF-RVR, for OP myoview  . Hypertension     long-standing  . Chronic renal insufficiency     followed by primary care physician  . Pulmonary embolism   . Gout     Current Outpatient Prescriptions  Medication Sig Dispense Refill  . allopurinol (ZYLOPRIM) 100 MG tablet Take 100 mg by mouth at bedtime.       [name] amiodarone (PACERONE) 200 MG tablet Take 0.5 tablets (100 mg total) by mouth daily.  45 tablet  3  . aspirin 81 MG EC tablet Take 1 tablet (81 mg total) by mouth daily.      [name]  COLCRYS 0.6 MG tablet Take 1 tablet by mouth 2 (two) times daily as needed. For gout      . diltiazem (CARDIZEM CD) 300 MG 24 hr capsule Take 1 capsule (300 mg total) by mouth daily.  30 capsule  6  . loratadine (CLARITIN) 10 MG tablet Take 10 mg by mouth daily.      . metoprolol tartrate (LOPRESSOR) 25 MG tablet Take 0.5 tablets (12.5 mg total) by mouth 2 (two) times daily.      . multivitamin (THERAGRAN) per tablet Take 1 tablet by mouth daily.        . nitroGLYCERIN (NITROSTAT) 0.4 MG SL tablet Place 1 tablet (0.4 mg total) under the tongue every 5 (five) minutes x 3 doses as needed for chest pain.  25 tablet  4  . pravastatin  (PRAVACHOL) 20 MG tablet Take 1 tablet (20 mg total) by mouth at bedtime.  30 tablet  6  . warfarin (COUMADIN) 5 MG tablet Take 1 tablet daily except 1 1/2 tablets on Tuesdays or as directed  40 tablet  6    Allergies: No Known Allergies  History  Substance Use Topics  . Smoking status: Former Smoker    Types: Cigarettes    Quit date: 01/04/1966  . Smokeless tobacco: Former Neurosurgeon    Types: Chew    Quit date: 01/05/2007   Comment: quit chewing tobacco about 4 yrs ago  . Alcohol Use: Yes     "seldomly drink beer" 1 every 2-3 weeks    Family History  Problem Relation Age of Onset  . Coronary artery disease Brother     MI     ROS:  Please see the history of present illness.     All other systems reviewed and negative.   PHYSICAL EXAM: VS:  BP 130/68  Pulse 66  Ht 6' (1.829 m)  Wt 240 lb (108.863 kg)  BMI 32.55 kg/m2 Well nourished, well developed, in no acute distress HEENT: normal Neck: no JVD Cardiac:  normal S1, S2; RRR; 1/6 systolic ejection murmur heard best at the RUSB Lungs:  clear to auscultation bilaterally, no wheezing, rhonchi or rales Abd: soft, nontender, no hepatomegaly Ext: no edema Skin: warm and dry Neuro:  CNs 2-12 intact, no focal abnormalities noted  EKG:  Sinus rhythm, heart rate 66, inferior Q waves, septal Q waves, no ischemic changes      ASSESSMENT AND PLAN:  1.  CAD He had a NSTEMI in the setting of atrial fibrillation with RVR.  This was felt to be demand ischemia.  However, his stress perfusion study is significantly abnormal.  I have reviewed this today with Dr. Excell Seltzer (DOD).  After review of his records, we have decided that it is best to proceed with re-look catheterization.  This will be arranged in the outpatient lab with Dr. Excell Seltzer. Risks and benefits of cardiac catheterization have been discussed with the patient.  These include bleeding, infection, kidney damage, stroke, heart attack, death.  The patient understands these risks and is  willing to proceed.   2.  Atrial fibrillation Maintaining sinus rhythm.  We will hold his Coumadin 3 days prior to his cardiac catheterization.  3.  Pulmonary embolism This occurred in 2011 after knee surgery.  It is acceptable for him to hold his Coumadin for cardiac catheterization.  4.  Chronic kidney disease Minimal dye load will be used for cardiac catheterization.  We discussed the potential for contrast-induced nephropathy.  5.  Hypertension Controlled.  Continue  current therapy.   Luna Glasgow, PA-C  5:34 PM 07/26/2011

## 2011-07-27 NOTE — H&P (Signed)
All data reviewed and I agree with the findings, assessment, and plan as outlined by Tereso Newcomer, PA-C. The patient was personally interviewed and examined and I discussed the plan with him.  Tonny Bollman 07/27/2011 11:42 PM

## 2011-07-30 ENCOUNTER — Other Ambulatory Visit (INDEPENDENT_AMBULATORY_CARE_PROVIDER_SITE_OTHER): Payer: Medicare Other

## 2011-07-30 ENCOUNTER — Other Ambulatory Visit: Payer: Medicare Other

## 2011-07-30 DIAGNOSIS — Z01818 Encounter for other preprocedural examination: Secondary | ICD-10-CM

## 2011-07-30 DIAGNOSIS — I4891 Unspecified atrial fibrillation: Secondary | ICD-10-CM

## 2011-07-30 DIAGNOSIS — Z7901 Long term (current) use of anticoagulants: Secondary | ICD-10-CM

## 2011-07-30 DIAGNOSIS — I2510 Atherosclerotic heart disease of native coronary artery without angina pectoris: Secondary | ICD-10-CM

## 2011-07-30 LAB — BASIC METABOLIC PANEL
BUN: 18 mg/dL (ref 6–23)
CO2: 25 mEq/L (ref 19–32)
Calcium: 9.5 mg/dL (ref 8.4–10.5)
Chloride: 102 mEq/L (ref 96–112)
Creatinine, Ser: 1.6 mg/dL — ABNORMAL HIGH (ref 0.4–1.5)
GFR: 45.58 mL/min — ABNORMAL LOW (ref >60.00)
Glucose, Bld: 89 mg/dL (ref 70–99)
Potassium: 4.4 mEq/L (ref 3.5–5.1)
Sodium: 137 mEq/L (ref 135–145)

## 2011-07-30 LAB — CBC WITH DIFFERENTIAL/PLATELET
Basophils Absolute: 0.1 10*3/uL (ref 0.0–0.1)
Basophils Relative: 1 % (ref 0.0–3.0)
Eosinophils Absolute: 0.3 10*3/uL (ref 0.0–0.7)
Eosinophils Relative: 4.4 % (ref 0.0–5.0)
HCT: 47.1 % (ref 39.0–52.0)
Hemoglobin: 15.8 g/dL (ref 13.0–17.0)
Lymphocytes Relative: 24.8 % (ref 12.0–46.0)
Lymphs Abs: 1.8 10*3/uL (ref 0.7–4.0)
MCHC: 33.5 g/dL (ref 30.0–36.0)
MCV: 92 fl (ref 78.0–100.0)
Monocytes Absolute: 0.8 10*3/uL (ref 0.1–1.0)
Monocytes Relative: 10.6 % (ref 3.0–12.0)
Neutro Abs: 4.3 10*3/uL (ref 1.4–7.7)
Neutrophils Relative %: 59.2 % (ref 43.0–77.0)
Platelets: 282 10*3/uL (ref 150.0–400.0)
RBC: 5.12 Mil/uL (ref 4.22–5.81)
RDW: 13.8 % (ref 11.5–14.6)
WBC: 7.3 10*3/uL (ref 4.5–10.5)

## 2011-07-30 LAB — PROTIME-INR
INR: 2.4 ratio — ABNORMAL HIGH (ref 0.8–1.0)
Prothrombin Time: 26.6 s — ABNORMAL HIGH (ref 10.2–12.4)

## 2011-08-02 ENCOUNTER — Ambulatory Visit (INDEPENDENT_AMBULATORY_CARE_PROVIDER_SITE_OTHER): Payer: Medicare Other | Admitting: *Deleted

## 2011-08-02 DIAGNOSIS — I4892 Unspecified atrial flutter: Secondary | ICD-10-CM

## 2011-08-02 DIAGNOSIS — Z7901 Long term (current) use of anticoagulants: Secondary | ICD-10-CM

## 2011-08-02 DIAGNOSIS — I4891 Unspecified atrial fibrillation: Secondary | ICD-10-CM

## 2011-08-02 LAB — POCT INR: INR: 1.7

## 2011-08-03 ENCOUNTER — Inpatient Hospital Stay (HOSPITAL_BASED_OUTPATIENT_CLINIC_OR_DEPARTMENT_OTHER)
Admission: RE | Admit: 2011-08-03 | Discharge: 2011-08-03 | Disposition: A | Discharge status: Home / Self Care | Payer: Medicare Other | Source: Ambulatory Visit | Attending: Cardiovascular Disease | Admitting: Cardiovascular Disease

## 2011-08-03 ENCOUNTER — Encounter (HOSPITAL_BASED_OUTPATIENT_CLINIC_OR_DEPARTMENT_OTHER)
Admission: RE | Disposition: A | Discharge status: Home / Self Care | Payer: Self-pay | Source: Ambulatory Visit | Attending: Cardiovascular Disease

## 2011-08-03 DIAGNOSIS — I2510 Atherosclerotic heart disease of native coronary artery without angina pectoris: Secondary | ICD-10-CM

## 2011-08-03 DIAGNOSIS — N189 Chronic kidney disease, unspecified: Secondary | ICD-10-CM | POA: Insufficient documentation

## 2011-08-03 DIAGNOSIS — I4891 Unspecified atrial fibrillation: Secondary | ICD-10-CM | POA: Insufficient documentation

## 2011-08-03 DIAGNOSIS — I252 Old myocardial infarction: Secondary | ICD-10-CM | POA: Insufficient documentation

## 2011-08-03 DIAGNOSIS — I129 Hypertensive chronic kidney disease with stage 1 through stage 4 chronic kidney disease, or unspecified chronic kidney disease: Secondary | ICD-10-CM | POA: Insufficient documentation

## 2011-08-03 DIAGNOSIS — I2582 Chronic total occlusion of coronary artery: Secondary | ICD-10-CM | POA: Insufficient documentation

## 2011-08-03 DIAGNOSIS — R9439 Abnormal result of other cardiovascular function study: Secondary | ICD-10-CM | POA: Insufficient documentation

## 2011-08-03 DIAGNOSIS — Z86711 Personal history of pulmonary embolism: Secondary | ICD-10-CM | POA: Insufficient documentation

## 2011-08-03 SURGERY — JV LEFT HEART CATHETERIZATION WITH CORONARY ANGIOGRAM
Anesthesia: Moderate Sedation

## 2011-08-03 MED ORDER — SODIUM CHLORIDE 0.9 % IV SOLN: INTRAVENOUS | Status: DC

## 2011-08-03 MED ORDER — ASPIRIN 81 MG PO CHEW
324.0000 mg | CHEWABLE_TABLET | ORAL | Status: AC
  Administered 2011-08-03: 243 mg via ORAL

## 2011-08-03 MED ORDER — SODIUM CHLORIDE 0.9 % IJ SOLN: 3.0000 mL | INTRAMUSCULAR | Status: DC | PRN

## 2011-08-03 MED ORDER — SODIUM CHLORIDE 0.9 % IV SOLN: 250.0000 mL | INTRAVENOUS | Status: DC | PRN

## 2011-08-03 MED ORDER — ONDANSETRON HCL 4 MG/2ML IJ SOLN: 4.0000 mg | Freq: Four times a day (QID) | INTRAMUSCULAR | Status: DC | PRN

## 2011-08-03 MED ORDER — SODIUM CHLORIDE 0.9 % IV SOLN: 1.0000 mL/kg/h | INTRAVENOUS | Status: DC

## 2011-08-03 MED ORDER — ACETAMINOPHEN 325 MG PO TABS: 650.0000 mg | ORAL_TABLET | ORAL | Status: DC | PRN

## 2011-08-03 MED ORDER — SODIUM CHLORIDE 0.9 % IJ SOLN: 3.0000 mL | Freq: Two times a day (BID) | INTRAMUSCULAR | Status: DC

## 2011-08-03 NOTE — OR Nursing (Signed)
Meal served 

## 2011-08-03 NOTE — CV Procedure (Signed)
   Cardiac Catheterization Procedure Note  Name: Gregory Davila MRN: [id number] DOB: Aug 14, 1940  Procedure: Catheter placement for coronary angiography, Selective Coronary Angiography  Indication: 71 year old gentleman with known coronary artery disease. He underwent nuclear stress testing that showed anterior scar with at least moderate peri-infarct ischemia. Because of the significant ischemia on stress testing, including suggestion of transient ischemic dilatation, he was referred for cardiac catheterization. He is actually having minimal anginal symptoms at present.   Procedural Details: The right wrist was prepped, draped, and anesthetized with 1% lidocaine. Using the modified Seldinger technique, a 5 French sheath was introduced into the right radial artery. 3 mg of verapamil was administered through the sheath, weight-based unfractionated heparin was administered intravenously. Standard Judkins catheters were used for selective coronary angiography. The patient's anatomy was such that the innominate artery arose from the far left side of the aortic arch which made catheter manipulation very difficult. Catheter exchanges were performed over an exchange length guidewire. There were no immediate procedural complications. A TR band was used for radial hemostasis at the completion of the procedure.  The patient was transferred to the post catheterization recovery area for further monitoring.  Procedural Findings: Hemodynamics: AO 120/67 with a mean of 90  Coronary angiography: Coronary dominance: right  Left mainstem: The left main is widely patent. There is no obstructive disease.  Left anterior descending (LAD): The LAD is totally occluded in the proximal segment. The vessel fills from very late antegrade filling as well as retrograde filling from right to left collaterals. The vessel fills all the way back to the proximal LAD.  Left circumflex (LCx): The left circumflex is patent. There is  diffuse irregularity throughout the course of the circumflex. There is a segment of 50% stenosis in the mid circumflex. The first obtuse marginal is a large branch and gives off multiple subbranches. This OM subbranches supply collaterals to the LAD through septal cascade.  Right coronary artery (RCA): The right coronary artery is a small and diffusely diseased artery. The mid segment of the vessel has diffuse 80% stenosis, but it is really not suitable for PCI because of diffuse distal vessel disease. There are well formed right to left collaterals supplying the LAD territory.  Left ventriculography: Deferred because of chronic kidney disease  Final Conclusions:   1. Total occlusion of the proximal LAD with multiple sources of collateral filling 2. Moderate left circumflex stenosis 3. Severe diffuse RCA stenosis.  Recommendations: This is a difficult situation. The patient requires long-term Coumadin. His right coronary artery is diffusely diseased and is really not suitable for PCI. However, this is an important source of collaterals to the LAD. I think the LAD total occlusion is probably treatable with PCI with a fairly high success rate of greater than 75%. The question is whether this should be done in this gentleman who already requires long-term anticoagulation and would require multidrug therapy after stenting. This is also in the setting of essentially no angina. I think we should review his stress test and I will asked Dr. Ival Bible opinion about whether the stress test result is of moderate or high-risk. I am then going to review the films with Dr. Clifton James for consideration of PCI to treat this patient's chronic total occlusion of the LAD.  Tonny Bollman 08/03/2011, 9:53 AM

## 2011-08-03 NOTE — OR Nursing (Signed)
Dr Cooper at bedside to discuss results and treatment plan with pt and family 

## 2011-08-03 NOTE — Progress Notes (Signed)
TR band removed, right radial site level 0.  Tegaderm dressing applied and right wrist immobilizer.

## 2011-08-03 NOTE — Interval H&P Note (Signed)
History and Physical Interval Note:  08/03/2011 9:16 AM  Gregory Davila  has presented today for surgery, with the diagnosis of cp  The various methods of treatment have been discussed with the patient and family. After consideration of risks, benefits and other options for treatment, the patient has consented to  Procedure(s) (LRB): JV LEFT HEART CATHETERIZATION WITH CORONARY ANGIOGRAM (N/A) as a surgical intervention .  The patient's history has been reviewed, patient examined, no change in status, stable for surgery.  I have reviewed the patient's chart and labs.  Questions were answered to the patient's satisfaction.     Tonny Bollman

## 2011-08-06 ENCOUNTER — Ambulatory Visit (INDEPENDENT_AMBULATORY_CARE_PROVIDER_SITE_OTHER): Payer: Medicare Other | Admitting: Cardiovascular Disease

## 2011-08-06 ENCOUNTER — Encounter: Payer: Self-pay | Admitting: Cardiovascular Disease

## 2011-08-06 VITALS — BP 130/80 | HR 72 | Resp 18 | Ht 72.0 in | Wt 233.4 lb

## 2011-08-06 DIAGNOSIS — I2510 Atherosclerotic heart disease of native coronary artery without angina pectoris: Secondary | ICD-10-CM

## 2011-08-06 DIAGNOSIS — I259 Chronic ischemic heart disease, unspecified: Secondary | ICD-10-CM

## 2011-08-06 DIAGNOSIS — I4891 Unspecified atrial fibrillation: Secondary | ICD-10-CM

## 2011-08-06 DIAGNOSIS — I10 Essential (primary) hypertension: Secondary | ICD-10-CM

## 2011-08-06 MED ORDER — ISOSORBIDE MONONITRATE ER 30 MG PO TB24: 30.0000 mg | ORAL_TABLET | Freq: Every day | ORAL | Status: DC

## 2011-08-06 NOTE — Assessment & Plan Note (Signed)
Maintaining sinus rhythm on amiodarone. Anticoagulated on warfarin. He will need surveillance of thyroid and LFTs.

## 2011-08-06 NOTE — Progress Notes (Signed)
HPI:  71 year old gentleman presenting for followup after recent cardiac catheterization. His cardiac history dates back to 2011 when he had a non-ST elevation infarction following total knee replacement. He underwent cardiac cath at that time demonstrating total occlusion of his proximal LAD. PCI was not done because he was asymptomatic and his infarct had been completed. He has been managed medically in the interim. The patient has developed exertional angina and he underwent a nuclear stress scan. His nuclear stress scan demonstrated LAD territory infarction with significant peri-infarct ischemia and transient ischemic dilatation. He was referred for repeat cardiac catheterization which was just performed last week. This demonstrated stable findings with moderately severe diffuse disease of the right coronary artery, not amenable to PCI because of small vessel size. There was total occlusion of the proximal LAD with both left to left and right-to-left collaterals. The left circumflex had nonobstructive disease. He returns today for further discussion.  From a symptomatic perspective, the patient describes chest pressure with moderate level activity such as pushing a lawnmower. He's able to do all his daily activities without much limitation. His anginal symptoms and a little bit inconsistent and sometimes they occur at lower level activities. He denies dyspnea, palpitations, orthopnea, or PND. He's been compliant with his medications.  Outpatient Encounter Prescriptions as of 08/06/2011  Medication Sig Dispense Refill  . allopurinol (ZYLOPRIM) 100 MG tablet Take 100 mg by mouth at bedtime.       [name] amiodarone (PACERONE) 200 MG tablet Take 0.5 tablets (100 mg total) by mouth daily.  45 tablet  3  . aspirin 81 MG EC tablet Take 1 tablet (81 mg total) by mouth daily.      [name] COLCRYS 0.6 MG tablet Take 1 tablet by mouth 2 (two) times daily as needed. For gout      . diltiazem (CARDIZEM CD) 300 MG 24 hr  capsule Take 1 capsule (300 mg total) by mouth daily.  30 capsule  6  . loratadine (CLARITIN) 10 MG tablet Take 10 mg by mouth daily.      . metoprolol tartrate (LOPRESSOR) 25 MG tablet Take 0.5 tablets (12.5 mg total) by mouth 2 (two) times daily.      . multivitamin (THERAGRAN) per tablet Take 1 tablet by mouth daily.        . nitroGLYCERIN (NITROSTAT) 0.4 MG SL tablet Place 1 tablet (0.4 mg total) under the tongue every 5 (five) minutes x 3 doses as needed for chest pain.  25 tablet  4  . pravastatin (PRAVACHOL) 20 MG tablet Take 1 tablet (20 mg total) by mouth at bedtime.  30 tablet  6  . warfarin (COUMADIN) 5 MG tablet Take 1 tablet daily except 1 1/2 tablets on Tuesdays or as directed  40 tablet  6  . isosorbide mononitrate (IMDUR) 30 MG 24 hr tablet Take 1 tablet (30 mg total) by mouth daily.  30 tablet  6    No Known Allergies  Past Medical History  Diagnosis Date  . Paroxysmal atrial fibrillation     a. On Coumadin therapy/amidarone. b. 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  [name] CAD (coronary artery disease)     a, 2011: occluded LAD with left to right collaterals and moderate disease in the right coronary artery and circumflex, EF 50%. b. NSTEMI 07/2011 in setting of AF-RVR, for OP myoview  . Hypertension     long-standing  . Chronic renal insufficiency     followed by primary  care physician  . Pulmonary embolism   . Gout     ROS: Negative except as per HPI  BP 130/80  Pulse 72  Resp 18  Ht 6' (1.829 m)  Wt 233 lb 6.4 oz (105.87 kg)  BMI 31.65 kg/m2  SpO2 91%  PHYSICAL EXAM: Pt is alert and oriented, NAD HEENT: normal Neck: JVP - normal Lungs: CTA bilaterally CV: RRR without murmur or gallop Abd: soft, NT, Positive BS Ext: no C/C/E, bruising of right forearm noted, but no firm hematoma and access site looks fine. Skin: warm/dry no rash  EKG:  Sinus rhythm 66 beats per minute, anteroseptal infarct age undetermined  ASSESSMENT AND  PLAN:

## 2011-08-06 NOTE — Assessment & Plan Note (Signed)
Controlled on metoprolol and diltiazem.

## 2011-08-06 NOTE — Assessment & Plan Note (Signed)
The patient is stable, with CCS class II anginal symptoms. We had a long discussion about his cardiac cath results. We discussed various treatment options including continued medical therapy, PCI, or coronary bypass surgery. Considering the fact that is the LAD has been occluded for 2 years and that the patient has stable anginal symptoms, I would be inclined to escalate his medical therapy as a first line approach. Isosorbide mononitrate 30 mg daily will be added. He will continue on the remaining cardiac medicines without changes. I like to see him back in 6 months and if symptoms progress he understands to call sooner. Of some concern is that a major part of his collateral to the LAD is supplied by a diffusely diseased right coronary artery. I think the technical success rate of performing PCI on his chronically occluded LAD would be about 75%. I am going to review his films with Dr. Clifton James to get his opinion as well. The other factor that makes me lean towards medical therapy is that the patient requires long-term Coumadin and this complicates antithrombotic treatment after PCI. All of these issues were discussed at length with the patient and his wife and they understand.

## 2011-08-06 NOTE — Patient Instructions (Addendum)
Your physician has recommended you make the following change in your medication: START Imdur 30mg  daily  Your physician wants you to follow-up in: 6 months.  You will receive a reminder letter in the mail two months in advance. If you don't receive a letter, please call our office to schedule the follow-up appointment.

## 2011-08-17 ENCOUNTER — Ambulatory Visit (INDEPENDENT_AMBULATORY_CARE_PROVIDER_SITE_OTHER): Payer: Medicare Other | Admitting: *Deleted

## 2011-08-17 DIAGNOSIS — Z7901 Long term (current) use of anticoagulants: Secondary | ICD-10-CM

## 2011-08-17 DIAGNOSIS — I4892 Unspecified atrial flutter: Secondary | ICD-10-CM

## 2011-08-17 DIAGNOSIS — I4891 Unspecified atrial fibrillation: Secondary | ICD-10-CM

## 2011-08-17 LAB — POCT INR: INR: 2

## 2011-09-13 ENCOUNTER — Other Ambulatory Visit: Payer: Self-pay | Admitting: Pharmacist

## 2011-09-13 MED ORDER — WARFARIN SODIUM 5 MG PO TABS: ORAL_TABLET | ORAL | Status: DC

## 2011-09-14 ENCOUNTER — Ambulatory Visit (INDEPENDENT_AMBULATORY_CARE_PROVIDER_SITE_OTHER): Payer: Medicare Other | Admitting: *Deleted

## 2011-09-14 DIAGNOSIS — I4891 Unspecified atrial fibrillation: Secondary | ICD-10-CM

## 2011-09-14 DIAGNOSIS — I4892 Unspecified atrial flutter: Secondary | ICD-10-CM

## 2011-09-14 DIAGNOSIS — Z7901 Long term (current) use of anticoagulants: Secondary | ICD-10-CM

## 2011-09-14 LAB — POCT INR: INR: 2.3

## 2011-09-14 MED ORDER — WARFARIN SODIUM 5 MG PO TABS: ORAL_TABLET | ORAL | Status: DC

## 2011-09-22 ENCOUNTER — Other Ambulatory Visit: Payer: Self-pay | Admitting: Cardiology

## 2011-09-22 NOTE — Telephone Encounter (Signed)
..   Requested Prescriptions   Pending Prescriptions Disp Refills  . CARDIZEM CD 300 MG 24 hr capsule [Pharmacy Med Name: CARDIZEM CD 300MG  CAP SA] 30 capsule 6    Sig: TAKE 1 CAPSULE BY MOUTH ONCE A DAY.

## 2011-10-22 ENCOUNTER — Other Ambulatory Visit: Payer: Self-pay | Admitting: Cardiology

## 2011-10-26 ENCOUNTER — Ambulatory Visit (INDEPENDENT_AMBULATORY_CARE_PROVIDER_SITE_OTHER): Payer: Medicare Other | Admitting: *Deleted

## 2011-10-26 DIAGNOSIS — I4892 Unspecified atrial flutter: Secondary | ICD-10-CM

## 2011-10-26 DIAGNOSIS — I4891 Unspecified atrial fibrillation: Secondary | ICD-10-CM

## 2011-10-26 DIAGNOSIS — Z7901 Long term (current) use of anticoagulants: Secondary | ICD-10-CM

## 2011-10-26 LAB — POCT INR: INR: 2.7

## 2011-11-11 ENCOUNTER — Other Ambulatory Visit: Payer: Self-pay | Admitting: Cardiology

## 2011-12-07 ENCOUNTER — Ambulatory Visit (INDEPENDENT_AMBULATORY_CARE_PROVIDER_SITE_OTHER): Payer: Medicare Other | Admitting: *Deleted

## 2011-12-07 ENCOUNTER — Other Ambulatory Visit: Payer: Self-pay | Admitting: Urology

## 2011-12-07 ENCOUNTER — Ambulatory Visit (INDEPENDENT_AMBULATORY_CARE_PROVIDER_SITE_OTHER): Payer: Medicare Other | Admitting: Urology

## 2011-12-07 DIAGNOSIS — R3129 Other microscopic hematuria: Secondary | ICD-10-CM

## 2011-12-07 DIAGNOSIS — R319 Hematuria, unspecified: Secondary | ICD-10-CM

## 2011-12-07 DIAGNOSIS — I4892 Unspecified atrial flutter: Secondary | ICD-10-CM

## 2011-12-07 DIAGNOSIS — I4891 Unspecified atrial fibrillation: Secondary | ICD-10-CM

## 2011-12-07 DIAGNOSIS — Z7901 Long term (current) use of anticoagulants: Secondary | ICD-10-CM

## 2011-12-07 LAB — POCT INR: INR: 2.6

## 2011-12-13 ENCOUNTER — Ambulatory Visit (HOSPITAL_COMMUNITY)
Admission: RE | Admit: 2011-12-13 | Discharge: 2011-12-13 | Disposition: A | Discharge status: Home / Self Care | Payer: Medicare Other | Source: Ambulatory Visit | Attending: Urology | Admitting: Urology

## 2011-12-13 DIAGNOSIS — N281 Cyst of kidney, acquired: Secondary | ICD-10-CM | POA: Insufficient documentation

## 2011-12-13 DIAGNOSIS — N200 Calculus of kidney: Secondary | ICD-10-CM | POA: Insufficient documentation

## 2011-12-13 DIAGNOSIS — R319 Hematuria, unspecified: Secondary | ICD-10-CM

## 2011-12-13 MED ORDER — IOHEXOL 300 MG/ML  SOLN
100.0000 mL | Freq: Once | INTRAMUSCULAR | Status: AC | PRN
  Administered 2011-12-13: 100 mL via INTRAVENOUS

## 2012-01-18 ENCOUNTER — Ambulatory Visit (INDEPENDENT_AMBULATORY_CARE_PROVIDER_SITE_OTHER): Payer: Medicare Other | Admitting: *Deleted

## 2012-01-18 DIAGNOSIS — I4891 Unspecified atrial fibrillation: Secondary | ICD-10-CM

## 2012-01-18 DIAGNOSIS — Z7901 Long term (current) use of anticoagulants: Secondary | ICD-10-CM

## 2012-01-18 DIAGNOSIS — I4892 Unspecified atrial flutter: Secondary | ICD-10-CM

## 2012-01-18 LAB — POCT INR: INR: 2.5

## 2012-01-25 ENCOUNTER — Ambulatory Visit (INDEPENDENT_AMBULATORY_CARE_PROVIDER_SITE_OTHER): Payer: Medicare Other | Admitting: Urology

## 2012-01-25 DIAGNOSIS — N281 Cyst of kidney, acquired: Secondary | ICD-10-CM

## 2012-01-25 DIAGNOSIS — R319 Hematuria, unspecified: Secondary | ICD-10-CM

## 2012-01-25 DIAGNOSIS — N200 Calculus of kidney: Secondary | ICD-10-CM

## 2012-02-14 ENCOUNTER — Other Ambulatory Visit: Payer: Self-pay | Admitting: Cardiovascular Disease

## 2012-02-15 ENCOUNTER — Ambulatory Visit: Payer: Medicare Other | Admitting: Cardiovascular Disease

## 2012-02-17 ENCOUNTER — Ambulatory Visit: Payer: Medicare Other | Admitting: Cardiovascular Disease

## 2012-02-29 ENCOUNTER — Ambulatory Visit (INDEPENDENT_AMBULATORY_CARE_PROVIDER_SITE_OTHER): Payer: Medicare Other | Admitting: *Deleted

## 2012-02-29 DIAGNOSIS — I4892 Unspecified atrial flutter: Secondary | ICD-10-CM

## 2012-02-29 DIAGNOSIS — Z7901 Long term (current) use of anticoagulants: Secondary | ICD-10-CM

## 2012-02-29 DIAGNOSIS — I4891 Unspecified atrial fibrillation: Secondary | ICD-10-CM

## 2012-02-29 LAB — POCT INR: INR: 2.6

## 2012-04-03 ENCOUNTER — Encounter: Payer: Self-pay | Admitting: Cardiovascular Disease

## 2012-04-03 ENCOUNTER — Ambulatory Visit (INDEPENDENT_AMBULATORY_CARE_PROVIDER_SITE_OTHER): Payer: Medicare Other | Admitting: Cardiovascular Disease

## 2012-04-03 ENCOUNTER — Other Ambulatory Visit: Payer: Self-pay | Admitting: *Deleted

## 2012-04-03 VITALS — BP 116/74 | HR 61 | Ht 71.0 in | Wt 211.0 lb

## 2012-04-03 DIAGNOSIS — I2510 Atherosclerotic heart disease of native coronary artery without angina pectoris: Secondary | ICD-10-CM

## 2012-04-03 DIAGNOSIS — I4891 Unspecified atrial fibrillation: Secondary | ICD-10-CM

## 2012-04-03 NOTE — Progress Notes (Signed)
HPI:  72 year old gentleman returning for followup evaluation. He's followed for coronary artery disease with known chronic occlusion of the proximal LAD. He underwent a cardiac catheterization in 2013 demonstrating moderately severe stenosis of a small right coronary artery, not amenable to PCI. The proximal LAD was occluded. There were right to left and left to left collaterals. The left circumflex had nonobstructive disease. He had CCS class II anginal symptoms at that time. After discussion we elected for medical therapy. Isosorbide was added to his medical program.  The patient is doing very well. He has lost about 30 pounds through diet and exercise. He is feeling much better with improved energy. He's had no recent anginal symptoms, shortness of breath, or leg swelling. 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 He has no angina with that level of activity. He's had no palpitations, lightheadedness, or syncope.  Outpatient Encounter Prescriptions as of 04/03/2012  Medication Sig Dispense Refill  . amiodarone (PACERONE) 200 MG tablet Take 0.5 tablets (100 mg total) by mouth daily.  45 tablet  3  . aspirin 81 MG EC tablet Take 1 tablet (81 mg total) by mouth daily.      [name] CARDIZEM CD 300 MG 24 hr capsule TAKE 1 CAPSULE BY MOUTH ONCE A DAY.  30 capsule  6  . COLCRYS 0.6 MG tablet Take 1 tablet by mouth 2 (two) times daily as needed. For gout      . isosorbide mononitrate (IMDUR) 30 MG 24 hr tablet TAKE 1 TABLET ONCE DAILY.  30 tablet  12  . loratadine (CLARITIN) 10 MG tablet Take 10 mg by mouth daily.      . metoprolol tartrate (LOPRESSOR) 25 MG tablet Take 0.5 tablets (12.5 mg total) by mouth 2 (two) times daily.  60 tablet  6  . multivitamin (THERAGRAN) per tablet Take 1 tablet by mouth daily.        . nitroGLYCERIN (NITROSTAT) 0.4 MG SL tablet Place 1 tablet (0.4 mg total) under the tongue every 5 (five) minutes x 3 doses as needed for  chest pain.  25 tablet  4  . pravastatin (PRAVACHOL) 20 MG tablet Take 1 tablet (20 mg total) by mouth at bedtime.  30 tablet  6  . warfarin (COUMADIN) 5 MG tablet Take 1 tablet daily except 1 1/2 tablets on Tuesdays or as directed  40 tablet  6  . [DISCONTINUED] allopurinol (ZYLOPRIM) 100 MG tablet Take 100 mg by mouth at bedtime.        No facility-administered encounter medications on file as of 04/03/2012.    No Known Allergies  Past Medical History  Diagnosis Date  . Paroxysmal atrial fibrillation     a. On Coumadin therapy/amidarone. b. 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  [name] CAD (coronary artery disease)     a, 2011: occluded LAD with left to right collaterals and moderate disease in the right coronary artery and circumflex, EF 50%. b. NSTEMI 07/2011 in setting of AF-RVR, for OP myoview  . Hypertension     long-standing  . Chronic renal insufficiency     followed by primary care physician  . Pulmonary embolism   . Gout     ROS: Negative except as per HPI  BP 116/74  Pulse 61  Ht 5\' 11"  (1.803 m)  Wt 211 lb (95.709 kg)  BMI 29.44 kg/m2  SpO2 94%  PHYSICAL EXAM: Pt  is alert and oriented, NAD HEENT: normal Neck: JVP - normal, carotids 2+= without bruits Lungs: CTA bilaterally CV: RRR without murmur or gallop Abd: soft, NT, Positive BS, no hepatomegaly Ext: no C/C/E, distal pulses intact and equal Skin: warm/dry no rash  EKG:  Normal sinus rhythm 61 beats per minute, possible anterior infarct age undetermined, otherwise within normal limits.  ASSESSMENT AND PLAN: 1. Coronary artery disease, native vessel. The patient is stable without anginal symptoms. He's made great strides with his lifestyle and weight loss. He will continue on his current medical program without change. I will see him back in one year.  2. Paroxysmal atrial fibrillation. The patient is maintaining sinus rhythm. Will continue warfarin for anticoagulation. He is on  amiodarone and requires monitoring of thyroid and liver function tests. He has lab work scheduled with Dr. Margo Common in June and will request that these labs be drawn at that time. He also should have a chest x-ray on a yearly basis and we will order this today.  3. Hypertension. Blood pressure well controlled on current medical program.  4. Hyperlipidemia. The patient is on low-dose pravastatin. Lipids followed by Dr. Margo Common.  Tonny Bollman 04/03/2012 9:21 AM

## 2012-04-03 NOTE — Patient Instructions (Addendum)
Your physician wants you to follow-up in: 1 YEAR with Dr Excell Seltzer.  You will receive a reminder letter in the mail two months in advance. If you don't receive a letter, please call our office to schedule the follow-up appointment.  Your physician recommends that you continue on your current medications as directed. Please refer to the Current Medication list given to you today.  A chest x-ray takes a picture of the organs and structures inside the chest, including the heart, lungs, and blood vessels. This test can show several things, including, whether the heart is enlarges; whether fluid is building up in the lungs; and whether pacemaker / defibrillator leads are still in place.  (order given to the patient to have this done in [place])

## 2012-04-18 ENCOUNTER — Ambulatory Visit (INDEPENDENT_AMBULATORY_CARE_PROVIDER_SITE_OTHER): Payer: Medicare Other | Admitting: *Deleted

## 2012-04-18 DIAGNOSIS — I4892 Unspecified atrial flutter: Secondary | ICD-10-CM

## 2012-04-18 DIAGNOSIS — Z7901 Long term (current) use of anticoagulants: Secondary | ICD-10-CM

## 2012-04-18 DIAGNOSIS — I4891 Unspecified atrial fibrillation: Secondary | ICD-10-CM

## 2012-04-18 LAB — POCT INR: INR: 2.3

## 2012-04-22 ENCOUNTER — Other Ambulatory Visit: Payer: Self-pay | Admitting: Cardiovascular Disease

## 2012-05-08 ENCOUNTER — Other Ambulatory Visit: Payer: Self-pay | Admitting: Cardiovascular Disease

## 2012-05-26 ENCOUNTER — Ambulatory Visit (INDEPENDENT_AMBULATORY_CARE_PROVIDER_SITE_OTHER): Payer: Medicare Other | Admitting: *Deleted

## 2012-05-26 DIAGNOSIS — I4892 Unspecified atrial flutter: Secondary | ICD-10-CM

## 2012-05-26 DIAGNOSIS — I4891 Unspecified atrial fibrillation: Secondary | ICD-10-CM

## 2012-05-26 DIAGNOSIS — Z7901 Long term (current) use of anticoagulants: Secondary | ICD-10-CM

## 2012-05-26 LAB — POCT INR: INR: 2.3

## 2012-06-01 ENCOUNTER — Other Ambulatory Visit: Payer: Self-pay | Admitting: Cardiology

## 2012-07-03 ENCOUNTER — Encounter: Payer: Self-pay | Admitting: Internal Medicine

## 2012-07-10 ENCOUNTER — Other Ambulatory Visit: Payer: Self-pay | Admitting: Cardiology

## 2012-07-18 ENCOUNTER — Ambulatory Visit (INDEPENDENT_AMBULATORY_CARE_PROVIDER_SITE_OTHER): Payer: Medicare Other | Admitting: *Deleted

## 2012-07-18 DIAGNOSIS — I4891 Unspecified atrial fibrillation: Secondary | ICD-10-CM

## 2012-07-18 DIAGNOSIS — I4892 Unspecified atrial flutter: Secondary | ICD-10-CM

## 2012-07-18 DIAGNOSIS — Z7901 Long term (current) use of anticoagulants: Secondary | ICD-10-CM

## 2012-07-18 LAB — POCT INR: INR: 2.6

## 2012-08-29 ENCOUNTER — Ambulatory Visit (INDEPENDENT_AMBULATORY_CARE_PROVIDER_SITE_OTHER): Payer: Medicare Other | Admitting: *Deleted

## 2012-08-29 DIAGNOSIS — I4892 Unspecified atrial flutter: Secondary | ICD-10-CM

## 2012-08-29 DIAGNOSIS — I4891 Unspecified atrial fibrillation: Secondary | ICD-10-CM

## 2012-08-29 DIAGNOSIS — Z7901 Long term (current) use of anticoagulants: Secondary | ICD-10-CM

## 2012-08-29 LAB — POCT INR: INR: 3.1

## 2012-09-21 ENCOUNTER — Other Ambulatory Visit: Payer: Self-pay | Admitting: Cardiology

## 2012-10-10 ENCOUNTER — Ambulatory Visit (INDEPENDENT_AMBULATORY_CARE_PROVIDER_SITE_OTHER): Payer: Medicare Other | Admitting: *Deleted

## 2012-10-10 DIAGNOSIS — I4891 Unspecified atrial fibrillation: Secondary | ICD-10-CM

## 2012-10-10 DIAGNOSIS — Z7901 Long term (current) use of anticoagulants: Secondary | ICD-10-CM

## 2012-10-10 DIAGNOSIS — I4892 Unspecified atrial flutter: Secondary | ICD-10-CM

## 2012-10-10 LAB — POCT INR: INR: 2.6

## 2012-11-21 ENCOUNTER — Ambulatory Visit (INDEPENDENT_AMBULATORY_CARE_PROVIDER_SITE_OTHER): Payer: Medicare Other | Admitting: *Deleted

## 2012-11-21 DIAGNOSIS — I4892 Unspecified atrial flutter: Secondary | ICD-10-CM

## 2012-11-21 DIAGNOSIS — I4891 Unspecified atrial fibrillation: Secondary | ICD-10-CM

## 2012-11-21 DIAGNOSIS — Z7901 Long term (current) use of anticoagulants: Secondary | ICD-10-CM

## 2012-11-21 LAB — POCT INR: INR: 2.4

## 2012-12-07 ENCOUNTER — Other Ambulatory Visit: Payer: Self-pay | Admitting: Cardiovascular Disease

## 2012-12-22 ENCOUNTER — Other Ambulatory Visit: Payer: Self-pay | Admitting: Cardiovascular Disease

## 2013-01-02 ENCOUNTER — Ambulatory Visit (INDEPENDENT_AMBULATORY_CARE_PROVIDER_SITE_OTHER): Payer: Medicare Other | Admitting: *Deleted

## 2013-01-02 ENCOUNTER — Encounter: Payer: Self-pay | Admitting: Cardiovascular Disease

## 2013-01-02 DIAGNOSIS — I4892 Unspecified atrial flutter: Secondary | ICD-10-CM

## 2013-01-02 DIAGNOSIS — I4891 Unspecified atrial fibrillation: Secondary | ICD-10-CM

## 2013-01-02 DIAGNOSIS — Z7901 Long term (current) use of anticoagulants: Secondary | ICD-10-CM

## 2013-01-02 LAB — POCT INR: INR: 2.5

## 2013-01-15 ENCOUNTER — Other Ambulatory Visit: Payer: Self-pay | Admitting: Cardiovascular Disease

## 2013-02-13 ENCOUNTER — Ambulatory Visit (INDEPENDENT_AMBULATORY_CARE_PROVIDER_SITE_OTHER): Payer: Medicare Other | Admitting: *Deleted

## 2013-02-13 DIAGNOSIS — Z5181 Encounter for therapeutic drug level monitoring: Secondary | ICD-10-CM | POA: Insufficient documentation

## 2013-02-13 DIAGNOSIS — I4892 Unspecified atrial flutter: Secondary | ICD-10-CM

## 2013-02-13 DIAGNOSIS — Z7901 Long term (current) use of anticoagulants: Secondary | ICD-10-CM

## 2013-02-13 DIAGNOSIS — I4891 Unspecified atrial fibrillation: Secondary | ICD-10-CM

## 2013-02-13 LAB — POCT INR: INR: 2.6

## 2013-02-14 ENCOUNTER — Other Ambulatory Visit: Payer: Self-pay | Admitting: Cardiovascular Disease

## 2013-02-21 ENCOUNTER — Other Ambulatory Visit: Payer: Self-pay | Admitting: Cardiovascular Disease

## 2013-03-27 ENCOUNTER — Ambulatory Visit (INDEPENDENT_AMBULATORY_CARE_PROVIDER_SITE_OTHER): Payer: Medicare Other | Admitting: *Deleted

## 2013-03-27 DIAGNOSIS — I4892 Unspecified atrial flutter: Secondary | ICD-10-CM

## 2013-03-27 DIAGNOSIS — Z7901 Long term (current) use of anticoagulants: Secondary | ICD-10-CM

## 2013-03-27 DIAGNOSIS — I4891 Unspecified atrial fibrillation: Secondary | ICD-10-CM

## 2013-03-27 DIAGNOSIS — Z5181 Encounter for therapeutic drug level monitoring: Secondary | ICD-10-CM

## 2013-03-27 LAB — POCT INR: INR: 2.5

## 2013-04-05 ENCOUNTER — Other Ambulatory Visit: Payer: Self-pay | Admitting: Cardiovascular Disease

## 2013-04-13 ENCOUNTER — Other Ambulatory Visit: Payer: Self-pay | Admitting: Cardiovascular Disease

## 2013-04-30 ENCOUNTER — Encounter: Payer: Self-pay | Admitting: Cardiovascular Disease

## 2013-04-30 ENCOUNTER — Ambulatory Visit (INDEPENDENT_AMBULATORY_CARE_PROVIDER_SITE_OTHER): Payer: Medicare Other | Admitting: Cardiovascular Disease

## 2013-04-30 VITALS — BP 132/82 | HR 78 | Ht 71.0 in | Wt 224.4 lb

## 2013-04-30 DIAGNOSIS — I4891 Unspecified atrial fibrillation: Secondary | ICD-10-CM

## 2013-04-30 DIAGNOSIS — I2510 Atherosclerotic heart disease of native coronary artery without angina pectoris: Secondary | ICD-10-CM

## 2013-04-30 DIAGNOSIS — I480 Paroxysmal atrial fibrillation: Secondary | ICD-10-CM

## 2013-04-30 DIAGNOSIS — Z9229 Personal history of other drug therapy: Secondary | ICD-10-CM

## 2013-04-30 MED ORDER — ISOSORBIDE MONONITRATE ER 30 MG PO TB24: ORAL_TABLET | ORAL | Status: DC

## 2013-04-30 MED ORDER — DILTIAZEM HCL ER COATED BEADS 300 MG PO CP24: ORAL_CAPSULE | ORAL | Status: DC

## 2013-04-30 MED ORDER — NITROGLYCERIN 0.4 MG SL SUBL: 0.4000 mg | SUBLINGUAL_TABLET | SUBLINGUAL | Status: DC | PRN

## 2013-04-30 MED ORDER — METOPROLOL TARTRATE 25 MG PO TABS: ORAL_TABLET | ORAL | Status: DC

## 2013-04-30 NOTE — Patient Instructions (Addendum)
Your physician recommends that you have lab work today: LIVER, TSH, FREE T4, BMP  Your physician recommends that you continue on your current medications as directed. Please refer to the Current Medication list given to you today.  Your physician wants you to follow-up in: 1 YEAR with Dr Excell Seltzerooper. You will receive a reminder letter in the mail two months in advance. If you don't receive a letter, please call our office to schedule the follow-up appointment.

## 2013-04-30 NOTE — Progress Notes (Signed)
HPI:  73 year old gentleman returning for followup evaluation. He's followed for coronary artery disease with known chronic occlusion of the proximal LAD. He underwent a cardiac catheterization in 2013 demonstrating moderately severe stenosis of a small right coronary artery, not amenable to PCI. The proximal LAD was occluded. There were right to left and left to left collaterals. The left circumflex had nonobstructive disease. He had CCS class II anginal symptoms at that time, but after the addition of isosorbide he has been free of any recurrent angina. The patient has been able to do work in his yard, play golf, and remain active without chest pain or pressure. He's been maintained on low-dose amiodarone for paroxysmal atrial fibrillation. He is chronically anticoagulated with warfarin.  The patient otherwise is doing well. He's had no shortness of breath, palpitations, orthopnea, or PND. He denies leg swelling, lightheadedness, or syncope.  Outpatient Encounter Prescriptions as of 04/30/2013  Medication Sig  . amiodarone (PACERONE) 200 MG tablet TAKE 1/2 TABLET BY MOUTH DAILY.  [name]. aspirin 81 MG EC tablet Take 1 tablet (81 mg total) by mouth daily.  [name]. COLCRYS 0.6 MG tablet Take 1 tablet by mouth 2 (two) times daily as needed. For gout  . diltiazem (CARDIZEM CD) 300 MG 24 hr capsule TAKE 1 CAPSULE DAILY.  . isosorbide mononitrate (IMDUR) 30 MG 24 hr tablet TAKE 1 TABLET DAILY.  [name]. loratadine (CLARITIN) 10 MG tablet Take 10 mg by mouth daily.  . metoprolol tartrate (LOPRESSOR) 25 MG tablet TAKE (1/2) TABLET BY MOUTH TWICE DAILY.  . multivitamin (THERAGRAN) per tablet Take 1 tablet by mouth daily.    . pravastatin (PRAVACHOL) 20 MG tablet Take 1 tablet (20 mg total) by mouth at bedtime.  [name]. warfarin (COUMADIN) 5 MG tablet TAKE 1 TABLET ONCE DAILY EXCEPT 1&1/2 TABLETS ON TUESDAY OR AS DIRECTED.  [name]. nitroGLYCERIN (NITROSTAT) 0.4 MG SL tablet Place 1 tablet (0.4 mg total) under the tongue every 5  (five) minutes x 3 doses as needed for chest pain.    No Known Allergies  Past Medical History  Diagnosis Date  . Paroxysmal atrial fibrillation     a. On Coumadin therapy/amidarone. b. 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  [name]. CAD (coronary artery disease)     a, 2011: occluded LAD with left to right collaterals and moderate disease in the right coronary artery and circumflex, EF 50%. b. NSTEMI 07/2011 in setting of AF-RVR, for OP myoview  . Hypertension     long-standing  . Chronic renal insufficiency     followed by primary care physician  . Pulmonary embolism   . Gout     ROS: Negative except as per HPI  BP 132/82  Pulse 78  Ht 5\' 11"  (1.803 m)  Wt 224 lb 6.4 oz (101.787 kg)  BMI 31.31 kg/m2  PHYSICAL EXAM: Pt is alert and oriented, NAD HEENT: normal Neck: JVP - normal, carotids 2+= without bruits Lungs: CTA bilaterally CV: RRR without murmur or gallop Abd: soft, NT, Positive BS, no hepatomegaly Ext: Trace bilateral ankle edema, distal pulses intact and equal Skin: warm/dry no rash  EKG:  Normal sinus rhythm 78 beats per minute, anteroseptal infarct age undetermined, no significant change from previous tracing  ASSESSMENT AND PLAN: 1. Coronary artery disease, native vessel. The patient is stable without symptoms of recurrent angina. He will continue his current medical program which includes aspirin, calcium channel blockade, long-acting nitrate, and beta blocker. I will see him back in  one year for followup evaluation. I stressed the importance of weight loss and exercise.  2. Paroxysmal atrial fibrillation. He remains on low-dose amiodarone. Will check thyroid function studies and LFTs for amiodarone monitoring. He is on warfarin without bleeding complications.  3. Hypertension. Blood pressure well controlled on a combination of diltiazem, isosorbide, metoprolol tartrate.  4. Hyperlipidemia. The patient is on pravastatin. He is followed  by his primary physician, Dr. Margo Commonapper.  Tonny BollmanMichael Arraya Buck 04/30/2013 5:13 PM

## 2013-05-01 LAB — HEPATIC FUNCTION PANEL
ALT: 50 U/L (ref 0–53)
AST: 48 U/L — ABNORMAL HIGH (ref 0–37)
Albumin: 4.3 g/dL (ref 3.5–5.2)
Alkaline Phosphatase: 62 U/L (ref 39–117)
Bilirubin, Direct: 0 mg/dL (ref 0.0–0.3)
Total Bilirubin: 0.5 mg/dL (ref 0.3–1.2)
Total Protein: 7.8 g/dL (ref 6.0–8.3)

## 2013-05-01 LAB — BASIC METABOLIC PANEL
BUN: 18 mg/dL (ref 6–23)
CO2: 22 mEq/L (ref 19–32)
Calcium: 9.5 mg/dL (ref 8.4–10.5)
Chloride: 104 mEq/L (ref 96–112)
Creatinine, Ser: 1.7 mg/dL — ABNORMAL HIGH (ref 0.4–1.5)
GFR: 42.87 mL/min — ABNORMAL LOW (ref >60.00)
Glucose, Bld: 90 mg/dL (ref 70–99)
Potassium: 4.4 mEq/L (ref 3.5–5.1)
Sodium: 135 mEq/L (ref 135–145)

## 2013-05-01 LAB — TSH: TSH: 5.27 u[IU]/mL (ref 0.35–5.50)

## 2013-05-01 LAB — T4, FREE: Free T4: 0.75 ng/dL (ref 0.60–1.60)

## 2013-05-11 ENCOUNTER — Ambulatory Visit (INDEPENDENT_AMBULATORY_CARE_PROVIDER_SITE_OTHER): Payer: Medicare Other | Admitting: *Deleted

## 2013-05-11 DIAGNOSIS — Z7901 Long term (current) use of anticoagulants: Secondary | ICD-10-CM

## 2013-05-11 DIAGNOSIS — I4892 Unspecified atrial flutter: Secondary | ICD-10-CM

## 2013-05-11 DIAGNOSIS — Z5181 Encounter for therapeutic drug level monitoring: Secondary | ICD-10-CM

## 2013-05-11 DIAGNOSIS — I4891 Unspecified atrial fibrillation: Secondary | ICD-10-CM

## 2013-05-11 LAB — POCT INR: INR: 2

## 2013-06-22 ENCOUNTER — Ambulatory Visit (INDEPENDENT_AMBULATORY_CARE_PROVIDER_SITE_OTHER): Payer: Medicare Other | Admitting: *Deleted

## 2013-06-22 DIAGNOSIS — Z7901 Long term (current) use of anticoagulants: Secondary | ICD-10-CM

## 2013-06-22 DIAGNOSIS — I4891 Unspecified atrial fibrillation: Secondary | ICD-10-CM

## 2013-06-22 DIAGNOSIS — I4892 Unspecified atrial flutter: Secondary | ICD-10-CM

## 2013-06-22 DIAGNOSIS — Z5181 Encounter for therapeutic drug level monitoring: Secondary | ICD-10-CM

## 2013-06-22 LAB — POCT INR: INR: 2.5

## 2013-07-23 ENCOUNTER — Other Ambulatory Visit: Payer: Self-pay | Admitting: Cardiovascular Disease

## 2013-07-26 ENCOUNTER — Telehealth: Payer: Self-pay

## 2013-07-26 MED ORDER — WARFARIN SODIUM 5 MG PO TABS: 5.0000 mg | ORAL_TABLET | ORAL | Status: DC

## 2013-07-26 NOTE — Telephone Encounter (Signed)
Rx sent 

## 2013-08-06 ENCOUNTER — Other Ambulatory Visit: Payer: Self-pay | Admitting: Cardiovascular Disease

## 2013-08-07 ENCOUNTER — Ambulatory Visit (INDEPENDENT_AMBULATORY_CARE_PROVIDER_SITE_OTHER): Payer: Medicare Other | Admitting: *Deleted

## 2013-08-07 DIAGNOSIS — Z7901 Long term (current) use of anticoagulants: Secondary | ICD-10-CM

## 2013-08-07 DIAGNOSIS — Z5181 Encounter for therapeutic drug level monitoring: Secondary | ICD-10-CM

## 2013-08-07 DIAGNOSIS — I4891 Unspecified atrial fibrillation: Secondary | ICD-10-CM

## 2013-08-07 DIAGNOSIS — I4892 Unspecified atrial flutter: Secondary | ICD-10-CM

## 2013-08-07 LAB — POCT INR: INR: 2.3

## 2013-09-21 ENCOUNTER — Ambulatory Visit (INDEPENDENT_AMBULATORY_CARE_PROVIDER_SITE_OTHER): Payer: Medicare Other | Admitting: *Deleted

## 2013-09-21 DIAGNOSIS — Z5181 Encounter for therapeutic drug level monitoring: Secondary | ICD-10-CM

## 2013-09-21 DIAGNOSIS — I4892 Unspecified atrial flutter: Secondary | ICD-10-CM

## 2013-09-21 DIAGNOSIS — Z7901 Long term (current) use of anticoagulants: Secondary | ICD-10-CM

## 2013-09-21 DIAGNOSIS — I4891 Unspecified atrial fibrillation: Secondary | ICD-10-CM

## 2013-09-21 LAB — POCT INR: INR: 2.6

## 2013-10-30 ENCOUNTER — Ambulatory Visit (INDEPENDENT_AMBULATORY_CARE_PROVIDER_SITE_OTHER): Payer: Medicare Other | Admitting: *Deleted

## 2013-10-30 DIAGNOSIS — I4891 Unspecified atrial fibrillation: Secondary | ICD-10-CM

## 2013-10-30 DIAGNOSIS — I4892 Unspecified atrial flutter: Secondary | ICD-10-CM

## 2013-10-30 DIAGNOSIS — Z7901 Long term (current) use of anticoagulants: Secondary | ICD-10-CM

## 2013-10-30 DIAGNOSIS — Z5181 Encounter for therapeutic drug level monitoring: Secondary | ICD-10-CM

## 2013-10-30 LAB — POCT INR: INR: 2.5

## 2013-11-01 ENCOUNTER — Other Ambulatory Visit (INDEPENDENT_AMBULATORY_CARE_PROVIDER_SITE_OTHER): Payer: Medicare Other | Admitting: *Deleted

## 2013-11-01 DIAGNOSIS — I2699 Other pulmonary embolism without acute cor pulmonale: Secondary | ICD-10-CM

## 2013-11-01 DIAGNOSIS — I10 Essential (primary) hypertension: Secondary | ICD-10-CM

## 2013-11-01 DIAGNOSIS — I4891 Unspecified atrial fibrillation: Secondary | ICD-10-CM

## 2013-11-01 LAB — HEPATIC FUNCTION PANEL
ALT: 36 U/L (ref 0–53)
AST: 35 U/L (ref 0–37)
Albumin: 3.7 g/dL (ref 3.5–5.2)
Alkaline Phosphatase: 69 U/L (ref 39–117)
Bilirubin, Direct: 0.1 mg/dL (ref 0.0–0.3)
Total Bilirubin: 0.8 mg/dL (ref 0.2–1.2)
Total Protein: 8.3 g/dL (ref 6.0–8.3)

## 2013-11-01 LAB — BASIC METABOLIC PANEL
BUN: 30 mg/dL — ABNORMAL HIGH (ref 6–23)
CO2: 23 mEq/L (ref 19–32)
Calcium: 9.6 mg/dL (ref 8.4–10.5)
Chloride: 104 mEq/L (ref 96–112)
Creatinine, Ser: 2.1 mg/dL — ABNORMAL HIGH (ref 0.4–1.5)
GFR: 33.83 mL/min — ABNORMAL LOW (ref >60.00)
Glucose, Bld: 89 mg/dL (ref 70–99)
Potassium: 3.9 mEq/L (ref 3.5–5.1)
Sodium: 135 mEq/L (ref 135–145)

## 2013-11-02 LAB — TSH: TSH: 1.03 u[IU]/mL (ref 0.35–4.50)

## 2013-11-02 LAB — T4, FREE: Free T4: 0.91 ng/dL (ref 0.60–1.60)

## 2013-11-23 ENCOUNTER — Encounter: Payer: Self-pay | Admitting: Cardiovascular Disease

## 2013-11-23 NOTE — Telephone Encounter (Signed)
New message    Calling for test results  

## 2013-11-23 NOTE — Telephone Encounter (Signed)
This encounter was created in error - please disregard.

## 2013-12-11 ENCOUNTER — Ambulatory Visit (INDEPENDENT_AMBULATORY_CARE_PROVIDER_SITE_OTHER): Payer: Medicare Other | Admitting: *Deleted

## 2013-12-11 ENCOUNTER — Other Ambulatory Visit: Payer: Self-pay | Admitting: Cardiovascular Disease

## 2013-12-11 DIAGNOSIS — I4892 Unspecified atrial flutter: Secondary | ICD-10-CM

## 2013-12-11 DIAGNOSIS — I4891 Unspecified atrial fibrillation: Secondary | ICD-10-CM

## 2013-12-11 DIAGNOSIS — Z5181 Encounter for therapeutic drug level monitoring: Secondary | ICD-10-CM

## 2013-12-11 DIAGNOSIS — Z7901 Long term (current) use of anticoagulants: Secondary | ICD-10-CM

## 2013-12-11 LAB — POCT INR: INR: 2.4

## 2014-01-22 ENCOUNTER — Encounter: Payer: Self-pay | Admitting: Cardiovascular Disease

## 2014-01-22 ENCOUNTER — Ambulatory Visit (INDEPENDENT_AMBULATORY_CARE_PROVIDER_SITE_OTHER): Payer: Medicare Other | Admitting: *Deleted

## 2014-01-22 DIAGNOSIS — I4892 Unspecified atrial flutter: Secondary | ICD-10-CM

## 2014-01-22 DIAGNOSIS — Z7901 Long term (current) use of anticoagulants: Secondary | ICD-10-CM

## 2014-01-22 DIAGNOSIS — Z5181 Encounter for therapeutic drug level monitoring: Secondary | ICD-10-CM

## 2014-01-22 DIAGNOSIS — I4891 Unspecified atrial fibrillation: Secondary | ICD-10-CM

## 2014-01-22 LAB — POCT INR: INR: 2.3

## 2014-02-01 ENCOUNTER — Other Ambulatory Visit: Payer: Self-pay | Admitting: Cardiovascular Disease

## 2014-02-04 ENCOUNTER — Inpatient Hospital Stay (HOSPITAL_COMMUNITY)
Admission: AD | Admit: 2014-02-04 | Discharge: 2014-03-05 | DRG: 229 | Disposition: A | Discharge status: Home / Self Care | Payer: Medicare Other | Source: Other Acute Inpatient Hospital | Attending: Cardiothoracic Surgery | Admitting: Cardiothoracic Surgery

## 2014-02-04 ENCOUNTER — Encounter (HOSPITAL_COMMUNITY): Payer: Self-pay | Admitting: *Deleted

## 2014-02-04 DIAGNOSIS — T50995A Adverse effect of other drugs, medicaments and biological substances, initial encounter: Secondary | ICD-10-CM | POA: Diagnosis not present

## 2014-02-04 DIAGNOSIS — Z87891 Personal history of nicotine dependence: Secondary | ICD-10-CM | POA: Diagnosis not present

## 2014-02-04 DIAGNOSIS — L270 Generalized skin eruption due to drugs and medicaments taken internally: Secondary | ICD-10-CM | POA: Diagnosis not present

## 2014-02-04 DIAGNOSIS — I25110 Atherosclerotic heart disease of native coronary artery with unstable angina pectoris: Secondary | ICD-10-CM | POA: Diagnosis present

## 2014-02-04 DIAGNOSIS — Z22322 Carrier or suspected carrier of Methicillin resistant Staphylococcus aureus: Secondary | ICD-10-CM

## 2014-02-04 DIAGNOSIS — A047 Enterocolitis due to Clostridium difficile: Secondary | ICD-10-CM | POA: Diagnosis not present

## 2014-02-04 DIAGNOSIS — R791 Abnormal coagulation profile: Secondary | ICD-10-CM | POA: Diagnosis present

## 2014-02-04 DIAGNOSIS — Z418 Encounter for other procedures for purposes other than remedying health state: Secondary | ICD-10-CM

## 2014-02-04 DIAGNOSIS — Z7982 Long term (current) use of aspirin: Secondary | ICD-10-CM

## 2014-02-04 DIAGNOSIS — Z419 Encounter for procedure for purposes other than remedying health state, unspecified: Secondary | ICD-10-CM

## 2014-02-04 DIAGNOSIS — Z86711 Personal history of pulmonary embolism: Secondary | ICD-10-CM

## 2014-02-04 DIAGNOSIS — Z86718 Personal history of other venous thrombosis and embolism: Secondary | ICD-10-CM | POA: Diagnosis not present

## 2014-02-04 DIAGNOSIS — R001 Bradycardia, unspecified: Secondary | ICD-10-CM | POA: Diagnosis present

## 2014-02-04 DIAGNOSIS — Y838 Other surgical procedures as the cause of abnormal reaction of the patient, or of later complication, without mention of misadventure at the time of the procedure: Secondary | ICD-10-CM | POA: Diagnosis not present

## 2014-02-04 DIAGNOSIS — I4891 Unspecified atrial fibrillation: Secondary | ICD-10-CM | POA: Diagnosis present

## 2014-02-04 DIAGNOSIS — N183 Chronic kidney disease, stage 3 (moderate): Secondary | ICD-10-CM | POA: Diagnosis present

## 2014-02-04 DIAGNOSIS — R0602 Shortness of breath: Secondary | ICD-10-CM

## 2014-02-04 DIAGNOSIS — I129 Hypertensive chronic kidney disease with stage 1 through stage 4 chronic kidney disease, or unspecified chronic kidney disease: Secondary | ICD-10-CM | POA: Diagnosis present

## 2014-02-04 DIAGNOSIS — I252 Old myocardial infarction: Secondary | ICD-10-CM | POA: Diagnosis not present

## 2014-02-04 DIAGNOSIS — N289 Disorder of kidney and ureter, unspecified: Secondary | ICD-10-CM

## 2014-02-04 DIAGNOSIS — I5022 Chronic systolic (congestive) heart failure: Secondary | ICD-10-CM | POA: Diagnosis present

## 2014-02-04 DIAGNOSIS — I2510 Atherosclerotic heart disease of native coronary artery without angina pectoris: Secondary | ICD-10-CM | POA: Diagnosis present

## 2014-02-04 DIAGNOSIS — I480 Paroxysmal atrial fibrillation: Secondary | ICD-10-CM | POA: Diagnosis present

## 2014-02-04 DIAGNOSIS — Z951 Presence of aortocoronary bypass graft: Secondary | ICD-10-CM

## 2014-02-04 DIAGNOSIS — Z96651 Presence of right artificial knee joint: Secondary | ICD-10-CM | POA: Diagnosis present

## 2014-02-04 DIAGNOSIS — I2582 Chronic total occlusion of coronary artery: Secondary | ICD-10-CM | POA: Diagnosis present

## 2014-02-04 DIAGNOSIS — I214 Non-ST elevation (NSTEMI) myocardial infarction: Secondary | ICD-10-CM | POA: Diagnosis present

## 2014-02-04 DIAGNOSIS — E876 Hypokalemia: Secondary | ICD-10-CM | POA: Diagnosis not present

## 2014-02-04 DIAGNOSIS — Z7901 Long term (current) use of anticoagulants: Secondary | ICD-10-CM | POA: Diagnosis not present

## 2014-02-04 DIAGNOSIS — I4819 Other persistent atrial fibrillation: Secondary | ICD-10-CM | POA: Diagnosis present

## 2014-02-04 DIAGNOSIS — N390 Urinary tract infection, site not specified: Secondary | ICD-10-CM | POA: Diagnosis not present

## 2014-02-04 DIAGNOSIS — D62 Acute posthemorrhagic anemia: Secondary | ICD-10-CM | POA: Diagnosis not present

## 2014-02-04 DIAGNOSIS — Z8249 Family history of ischemic heart disease and other diseases of the circulatory system: Secondary | ICD-10-CM | POA: Diagnosis not present

## 2014-02-04 DIAGNOSIS — Z01818 Encounter for other preprocedural examination: Secondary | ICD-10-CM

## 2014-02-04 DIAGNOSIS — B961 Klebsiella pneumoniae [K. pneumoniae] as the cause of diseases classified elsewhere: Secondary | ICD-10-CM | POA: Diagnosis not present

## 2014-02-04 DIAGNOSIS — I200 Unstable angina: Secondary | ICD-10-CM | POA: Diagnosis present

## 2014-02-04 DIAGNOSIS — I255 Ischemic cardiomyopathy: Secondary | ICD-10-CM | POA: Diagnosis present

## 2014-02-04 DIAGNOSIS — E785 Hyperlipidemia, unspecified: Secondary | ICD-10-CM | POA: Diagnosis present

## 2014-02-04 DIAGNOSIS — Z79899 Other long term (current) drug therapy: Secondary | ICD-10-CM | POA: Diagnosis not present

## 2014-02-04 DIAGNOSIS — R0902 Hypoxemia: Secondary | ICD-10-CM | POA: Diagnosis not present

## 2014-02-04 DIAGNOSIS — T8132XA Disruption of internal operation (surgical) wound, not elsewhere classified, initial encounter: Secondary | ICD-10-CM | POA: Diagnosis not present

## 2014-02-04 DIAGNOSIS — R079 Chest pain, unspecified: Secondary | ICD-10-CM

## 2014-02-04 DIAGNOSIS — M109 Gout, unspecified: Secondary | ICD-10-CM | POA: Diagnosis present

## 2014-02-04 DIAGNOSIS — R0789 Other chest pain: Secondary | ICD-10-CM | POA: Diagnosis present

## 2014-02-04 DIAGNOSIS — I10 Essential (primary) hypertension: Secondary | ICD-10-CM | POA: Diagnosis present

## 2014-02-04 HISTORY — DX: Angina pectoris, unspecified: I20.9

## 2014-02-04 HISTORY — DX: Acute myocardial infarction, unspecified: I21.9

## 2014-02-04 LAB — MRSA PCR SCREENING: MRSA by PCR: POSITIVE — AB

## 2014-02-04 LAB — TSH: TSH: 2.884 u[IU]/mL (ref 0.350–4.500)

## 2014-02-04 LAB — TROPONIN I: Troponin I: 0.16 ng/mL — ABNORMAL HIGH (ref <0.031)

## 2014-02-04 MED ORDER — ADULT MULTIVITAMIN W/MINERALS CH
1.0000 | ORAL_TABLET | Freq: Every day | ORAL | Status: DC
  Administered 2014-02-04 – 2014-02-09 (×6): 1 via ORAL
  Filled 2014-02-04 (×7): qty 1

## 2014-02-04 MED ORDER — DILTIAZEM HCL ER COATED BEADS 300 MG PO CP24
300.0000 mg | ORAL_CAPSULE | Freq: Every day | ORAL | Status: DC
  Administered 2014-02-04: 300 mg via ORAL
  Filled 2014-02-04 (×2): qty 1

## 2014-02-04 MED ORDER — COLCHICINE 0.6 MG PO TABS
0.6000 mg | ORAL_TABLET | Freq: Two times a day (BID) | ORAL | Status: DC | PRN
  Administered 2014-02-05 – 2014-02-08 (×5): 0.6 mg via ORAL
  Filled 2014-02-04 (×6): qty 1

## 2014-02-04 MED ORDER — ACETAMINOPHEN 325 MG PO TABS: 650.0000 mg | ORAL_TABLET | ORAL | Status: DC | PRN

## 2014-02-04 MED ORDER — AMIODARONE HCL 100 MG PO TABS
100.0000 mg | ORAL_TABLET | Freq: Every day | ORAL | Status: DC
  Administered 2014-02-04 – 2014-02-05 (×2): 100 mg via ORAL
  Filled 2014-02-04 (×2): qty 1

## 2014-02-04 MED ORDER — LORATADINE 10 MG PO TABS
10.0000 mg | ORAL_TABLET | Freq: Every day | ORAL | Status: DC
  Administered 2014-02-04 – 2014-02-13 (×10): 10 mg via ORAL
  Filled 2014-02-04 (×11): qty 1

## 2014-02-04 MED ORDER — WARFARIN SODIUM 5 MG PO TABS
5.0000 mg | ORAL_TABLET | Freq: Every day | ORAL | Status: DC
  Administered 2014-02-04: 5 mg via ORAL
  Filled 2014-02-04 (×2): qty 1

## 2014-02-04 MED ORDER — ZOLPIDEM TARTRATE 5 MG PO TABS: 5.0000 mg | ORAL_TABLET | Freq: Every evening | ORAL | Status: DC | PRN

## 2014-02-04 MED ORDER — METOPROLOL TARTRATE 25 MG PO TABS
25.0000 mg | ORAL_TABLET | Freq: Two times a day (BID) | ORAL | Status: DC
  Administered 2014-02-04 – 2014-02-09 (×9): 25 mg via ORAL
  Filled 2014-02-04 (×12): qty 1

## 2014-02-04 MED ORDER — ASPIRIN EC 81 MG PO TBEC
81.0000 mg | DELAYED_RELEASE_TABLET | Freq: Every day | ORAL | Status: DC
  Administered 2014-02-04 – 2014-02-13 (×9): 81 mg via ORAL
  Filled 2014-02-04 (×11): qty 1

## 2014-02-04 MED ORDER — WARFARIN - PHARMACIST DOSING INPATIENT: Freq: Every day | Status: DC

## 2014-02-04 MED ORDER — SODIUM CHLORIDE 0.9 % IV SOLN: 250.0000 mL | INTRAVENOUS | Status: DC | PRN

## 2014-02-04 MED ORDER — PRAVASTATIN SODIUM 20 MG PO TABS
20.0000 mg | ORAL_TABLET | Freq: Every day | ORAL | Status: DC
  Administered 2014-02-04 – 2014-02-08 (×5): 20 mg via ORAL
  Filled 2014-02-04 (×6): qty 1

## 2014-02-04 MED ORDER — NITROGLYCERIN 0.4 MG SL SUBL
0.4000 mg | SUBLINGUAL_TABLET | SUBLINGUAL | Status: DC | PRN
  Administered 2014-02-10 (×2): 0.4 mg via SUBLINGUAL
  Filled 2014-02-04 (×2): qty 1

## 2014-02-04 MED ORDER — SODIUM CHLORIDE 0.9 % IJ SOLN
3.0000 mL | Freq: Two times a day (BID) | INTRAMUSCULAR | Status: DC
  Administered 2014-02-04 – 2014-02-11 (×12): 3 mL via INTRAVENOUS

## 2014-02-04 MED ORDER — SODIUM CHLORIDE 0.9 % IJ SOLN: 3.0000 mL | INTRAMUSCULAR | Status: DC | PRN

## 2014-02-04 MED ORDER — ISOSORBIDE MONONITRATE ER 30 MG PO TB24
30.0000 mg | ORAL_TABLET | Freq: Every day | ORAL | Status: DC
  Administered 2014-02-04 – 2014-02-10 (×7): 30 mg via ORAL
  Filled 2014-02-04 (×7): qty 1

## 2014-02-04 MED ORDER — ONDANSETRON HCL 4 MG/2ML IJ SOLN: 4.0000 mg | Freq: Four times a day (QID) | INTRAMUSCULAR | Status: DC | PRN

## 2014-02-04 MED ORDER — ALPRAZOLAM 0.25 MG PO TABS: 0.2500 mg | ORAL_TABLET | Freq: Two times a day (BID) | ORAL | Status: DC | PRN

## 2014-02-04 NOTE — Progress Notes (Signed)
ANTICOAGULATION CONSULT NOTE - Initial Consult  Pharmacy Consult for Coumadin Indication: atrial fibrillation  No Known Allergies  Patient Measurements: Height: 5\' 11"  (180.3 cm) Weight: 228 lb 2.8 oz (103.5 kg) IBW/kg (Calculated) : 75.3   Vital Signs: Temp: 97.9 F (36.6 C) (02/01 1651) Temp Source: Oral (02/01 1651) BP: 148/80 mmHg (02/01 1900) Pulse Rate: 66 (02/01 1900)  Labs: No results for input(s): HGB, HCT, PLT, APTT, LABPROT, INR, HEPARINUNFRC, CREATININE, CKTOTAL, CKMB, TROPONINI in the last 72 hours.  CrCl cannot be calculated (Patient has no serum creatinine result on file.).   Medical History: Past Medical History  Diagnosis Date  . Paroxysmal atrial fibrillation     a. On Coumadin therapy/amidarone. b. 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  [name]. CAD (coronary artery disease)     a, 2011: occluded LAD with left to right collaterals and moderate disease in the right coronary artery and circumflex, EF 50%. b. NSTEMI 07/2011 in setting of AF-RVR, MV abnl, cath w/ LAD 100%, RCA mod-severe dz, small vessel, med rx  . Hypertension     long-standing  . Chronic renal insufficiency     followed by primary care physician  . Pulmonary embolism   . Gout   . Anginal pain       Assessment: 74yom with Hx CAD and Afib, PE - on warfarin pta,  admitted to OSH with chest tightness.  Found to be in Afib with RVR and started on diltiazem drip.  His CBC -wnl, Cr 1.7, INR 2.4.    Goal of Therapy:  INR 2-3 Monitor platelets by anticoagulation protocol: Yes   Plan:  Continue home dose warfarin 5mg  daily Daily INR  Leota SauersLisa Evelyn Aguinaldo Pharm.D. CPP, BCPS Clinical Pharmacist [phone] 02/04/2014 8:11 PM

## 2014-02-04 NOTE — H&P (Signed)
History and Physical   Patient ID: Gregory Davila MRN: [id number], DOB/AGE: [birth year] 74 y.o. Date of Encounter: 02/04/2014  Primary Physician: Louie BostonAPPER,DAVID B, MD Primary Cardiologist: Dr Excell Seltzerooper, last office visit 04/2013   Chief Complaint:  Chest pain  HPI: Gregory Davila is a 74 y.o. male with a history of CAD treated medically, PAF on coumadin, HTN, CKD, remote PE, last evaluated for chest pain in 2013, see cath report below.    He has not had an episode of the chest tightness that was his previous angina in over a month. Over the last few weeks, he has noticed some chest discomfort that was different from his angina. He felt that was indigestion. It improved with Gaviscon. Those symptoms resolved.  Yesterday, he was in his usual state of health playing golf. After playing golf he went to eat. After eating, he had sudden onset of burning chest and epigastric pain which was more severe than his previous angina and was associated with nausea. The pain reached a 9/10. After he vomited times one, the pain decreased to a 5/10. He felt the symptoms were secondary to his heart and so had his wife take [facility]. He had no palpitations. [facility], he was in atrial fibrillation with rapid ventricular response. He was given IV Cardizem and by mouth metoprolol.   He spontaneously converted to sinus rhythm. His initial troponin was negative.  He was [facility] by a [facility], [facility].   Since he received the rate control medications, and converted to sinus rhythm, his chest pain has resolved and has not returned.  Past Medical History  Diagnosis Date  . Paroxysmal atrial fibrillation     a. On Coumadin therapy/amidarone. b. 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  [name]. CAD (coronary artery disease)     a, 2011: occluded LAD with left to right collaterals and moderate disease in the right coronary  artery and circumflex, EF 50%. b. NSTEMI 07/2011 in setting of AF-RVR, MV abnl, cath w/ LAD 100%, RCA mod-severe dz, small vessel, med rx  . Hypertension     long-standing  . Chronic renal insufficiency     followed by primary care physician  . Pulmonary embolism   . Gout   . Anginal pain     Surgical History:  Past Surgical History  Procedure Laterality Date  . Cardiac catheterization  2011 & 2013    2013 L main [place], LAD 100%, CFX 50%, RCA 80%, small vessel, med rx  . Nm myoview ltd  2013    Scar in the anteroseptal/periapical distribution with moderate peri-infarct ischemia, EF 43%     I have reviewed the patient's current medications. Prior to Admission medications   Medication Sig Start Date End Date Taking? Authorizing Provider  amiodarone (PACERONE) 200 MG tablet TAKE (1/2) TABLET BY MOUTH DAILY. 02/01/14  Yes Micheline ChapmanMichael D Cooper, MD  aspirin 81 MG EC tablet Take 1 tablet (81 mg total) by mouth daily. 07/14/11  Yes Dayna N Dunn, PA-C  COLCRYS 0.6 MG tablet Take 1 tablet by mouth 2 (two) times daily as needed. For gout 11/24/10  Yes Historical Provider, MD  diltiazem (CARDIZEM CD) 300 MG 24 hr capsule TAKE 1 CAPSULE DAILY. 04/30/13  Yes Micheline ChapmanMichael D Cooper, MD  isosorbide mononitrate (IMDUR) 30 MG 24 hr tablet TAKE 1 TABLET DAILY. 04/30/13  Yes Micheline ChapmanMichael D Cooper, MD  loratadine (CLARITIN) 10  MG tablet Take 10 mg by mouth daily.   Yes Historical Provider, MD  metoprolol tartrate (LOPRESSOR) 25 MG tablet TAKE (1/2) TABLET BY MOUTH TWICE DAILY. 04/30/13  Yes Micheline Chapman, MD  multivitamin [facility]) per tablet Take 1 tablet by mouth daily.     Yes Historical Provider, MD  pravastatin (PRAVACHOL) 20 MG tablet Take 1 tablet (20 mg total) by mouth at bedtime. 07/24/10  Yes June Leap, MD  warfarin (COUMADIN) 5 MG tablet Take 1 tablet (5 mg total) by mouth as directed. Patient taking differently: Take 5 mg by mouth daily.  07/26/13  Yes Micheline Chapman, MD  nitroGLYCERIN (NITROSTAT) 0.4 MG SL  tablet Place 1 tablet (0.4 mg total) under the tongue every 5 (five) minutes x 3 doses as needed for chest pain. 04/30/13 04/30/14  Micheline Chapman, MD   Scheduled Meds: Continuous Infusions: PRN Meds:.  Allergies: No Known Allergies  History   Social History  . Marital Status: Married    Spouse Name: N/A    Number of Children: N/A  . Years of Education: N/A   Occupational History  . Retired    Social History Main Topics  . Smoking status: Former Smoker    Types: Cigarettes    Quit date: 01/04/1966  . Smokeless tobacco: Former Neurosurgeon    Types: Chew    Quit date: 01/05/2007     Comment: quit chewing tobacco about 4 yrs ago  . Alcohol Use: Yes     Comment: "seldomly drink beer" 1 every 2-3 weeks  . Drug Use: No  . Sexual Activity: Yes   Other Topics Concern  . Not on file   Social History Narrative    Family History  Problem Relation Age of Onset  . Coronary artery disease Brother     MI   No family status information on file.    Review of Systems: He has been able to control his angina with a combination of medications, and limiting his activity. He does not get angina unless he overexerts himself. He has not had an episode of angina in over one month. He is tolerating the Coumadin well. No recent acute illnesses, fevers or chills.  Full 14-point review of systems otherwise negative except as noted above.  Physical Exam: Blood pressure 148/80, pulse 66, temperature 97.9 F (36.6 C), temperature source Oral, resp. rate 16, height  (1.803 m), weight 228 lb 2.8 oz (103.5 kg), SpO2 95 %. General: Well developed, well nourished,male in no acute distress. Head: Normocephalic, atraumatic, sclera non-icteric, no xanthomas, nares are without discharge. Dentition:  Neck: No carotid bruits. JVD not elevated. No thyromegally Lungs: Good expansion bilaterally. without wheezes or rhonchi.  Heart: Regular rate and rhythm with S1 S2.  No S3 or S4. Soft murmur, no rubs, or  gallops appreciated. Abdomen: Soft, non-tender, non-distended with normoactive bowel sounds. No hepatomegaly. No rebound/guarding. No obvious abdominal masses. Msk:  Strength and tone appear normal for age. No joint deformities or effusions, no spine or costo-vertebral angle tenderness. Extremities: No clubbing or cyanosis. No edema.  Distal pedal pulses are 2+ in 4 extrem Neuro: Alert and oriented X 3. Moves all extremities spontaneously. No focal deficits noted. Psych:  Responds to questions appropriately with a normal affect. Skin: No rashes or lesions noted  Labs: AT [facility]   Lab Results  Component Value Date   WBC 13.5 02/04/2014   HGB 14.7     HCT 44.3     MCV  88.4     PLT 236          NA 134    K 3.8    CL 103    CO2 22    BUN 19    Cr 1.70    GLU 142         INR 2.4    Troponin I 0.03    Radiology/Studies: AT [facility] CXR: Bibasilar ATX, otherwise clear   MV 2013 at [facility] Scar involving the apex, primarily anteroseptal wall and to some degree the apical inferior wall, EF 43%, TID ratio 1.21. The study in is felt to be consistent with underlying CAD, infarct in the anteroseptal/periapical distribution with moderate peri-infarct ischemia, possible even component of balanced multivessel ischemia with mildly increased TID ratio.   Cardiac Cath: 08/03/2011 Left mainstem: The left main is widely patent. There is no obstructive disease. Left anterior descending (LAD): The LAD is totally occluded in the proximal segment. The vessel fills from very late antegrade filling as well as retrograde filling from right to left collaterals. The vessel fills all the way back to the proximal LAD. Left circumflex (LCx): The left circumflex is patent. There is diffuse irregularity throughout the course of the circumflex. There is a segment of 50% stenosis in the mid circumflex. The first obtuse marginal is a large branch and gives off multiple subbranches. This OM subbranches supply  collaterals to the LAD through septal cascade. Right coronary artery (RCA): The right coronary artery is a small and diffusely diseased artery. The mid segment of the vessel has diffuse 80% stenosis, but it is really not suitable for PCI because of diffuse distal vessel disease. There are well formed right to left collaterals supplying the LAD territory. Left ventriculography: Deferred because of chronic kidney disease Final Conclusions:  1. Total occlusion of the proximal LAD with multiple sources of collateral filling 2. Moderate left circumflex stenosis 3. Severe diffuse RCA stenosis. Recommendations: This is a difficult situation. The patient requires long-term Coumadin. His right coronary artery is diffusely diseased and is really not suitable for PCI. However, this is an important source of collaterals to the LAD. I think the LAD total occlusion is probably treatable with PCI with a fairly high success rate of greater than 75%. The question is whether this should be done in this gentleman who already requires long-term anticoagulation and would require multidrug therapy after stenting. This is also in the setting of essentially no angina. I think we should review his stress test and I will asked Dr. Ival Bible opinion about whether the stress test result is of moderate or high-risk. I am then going to review the films with Dr. Clifton James for consideration of PCI to treat this patient's chronic total occlusion of the LAD.  Echo: 2012 in office EF 50%, distal anterior akinesis  ECG: Sinus rhythm, no acute ischemic changes.  ASSESSMENT AND PLAN:  Principal Problem:   Coronary atherosclerosis of native coronary artery - 9 was possibly secondary to rapid atrial fibrillation in the setting of known CAD. We will cycle cardiac enzymes. If his enzymes are not significantly elevated, continue medical therapy. He is on aspirin, beta blocker, statin and oral nitrates. Continue these medications.  Active  Problems:   Paroxysmal atrial fibrillation - he was in atrial fibrillation, RVR upon arrival at [facility], s/p diltiazem 30 mg IV 2 doses and metoprolol 25 mg by mouth. He spontaneously converted to sinus rhythm. Continue his home doses of beta blocker and Cardizem. May be able  to increase his beta blocker.    Hypertension - blood pressure is currently elevated, give p.m. medications now    Renal insufficiency - he has chronic kidney disease, creatinine is at/below baseline, followed    Atrial fibrillation with RVR - seen on presentation to [facility], PA-C 02/04/2014 7:51 PM Beeper [phone]  I have seen and examined the patient along with Theodore Demark, PA-C.  I have reviewed the chart, notes and new data.  I agree with PA's note.  Key new complaints: currently asymptomatic Key examination changes: no clinical HF, currently in NSR Key new findings / data: enzymes normal at [facility]  PLAN: Unstable angina triggered by arrhythmia. Very little reason to suspect a true acute coronary event or a recent change in coronary anatomy. Goal is to prevent tachycardia during recurrent episodes of atrial fibrillation. Increase beta blocker dose. Ranexa as an antianginal may have fringe benefits in AF prevention. If sinus bradycardia occurs, limiting value of rate control strategy, could consider a true antiarrhythmic, but the presence of significant CKD and CAD makes the list of usable agents very short. Would not be surprised if cardiac troponin "bumps" a little. Would not repeat coronary angio unless there is a very high jump in cardiac enzymes. I do not think we need to stop warfarin. DC heparin.  Thurmon Fair, MD, [facility] [phone] 02/04/2014, 8:05 PM

## 2014-02-05 LAB — COMPREHENSIVE METABOLIC PANEL
ALT: 59 U/L — ABNORMAL HIGH (ref 0–53)
AST: 72 U/L — ABNORMAL HIGH (ref 0–37)
Albumin: 3.4 g/dL — ABNORMAL LOW (ref 3.5–5.2)
Alkaline Phosphatase: 60 U/L (ref 39–117)
Anion gap: 5 (ref 5–15)
BUN: 15 mg/dL (ref 6–23)
CO2: 25 mmol/L (ref 19–32)
Calcium: 8.6 mg/dL (ref 8.4–10.5)
Chloride: 106 mmol/L (ref 96–112)
Creatinine, Ser: 1.71 mg/dL — ABNORMAL HIGH (ref 0.50–1.35)
GFR calc Af Amer: 44 mL/min — ABNORMAL LOW (ref >90)
GFR calc non Af Amer: 38 mL/min — ABNORMAL LOW (ref >90)
Glucose, Bld: 113 mg/dL — ABNORMAL HIGH (ref 70–99)
Potassium: 4.3 mmol/L (ref 3.5–5.1)
Sodium: 136 mmol/L (ref 135–145)
Total Bilirubin: 0.5 mg/dL (ref 0.3–1.2)
Total Protein: 6.8 g/dL (ref 6.0–8.3)

## 2014-02-05 LAB — PROTIME-INR
INR: >10 (ref 0.00–1.49)
INR: 1.95 — ABNORMAL HIGH (ref 0.00–1.49)
Prothrombin Time: >90 seconds — ABNORMAL HIGH (ref 11.6–15.2)
Prothrombin Time: 22.4 seconds — ABNORMAL HIGH (ref 11.6–15.2)

## 2014-02-05 LAB — TROPONIN I
Troponin I: 0.18 ng/mL — ABNORMAL HIGH (ref <0.031)
Troponin I: 0.18 ng/mL — ABNORMAL HIGH (ref <0.031)

## 2014-02-05 MED ORDER — RANOLAZINE ER 500 MG PO TB12
500.0000 mg | ORAL_TABLET | Freq: Two times a day (BID) | ORAL | Status: DC
  Administered 2014-02-05 – 2014-02-13 (×18): 500 mg via ORAL
  Filled 2014-02-05 (×21): qty 1

## 2014-02-05 MED ORDER — WARFARIN SODIUM 7.5 MG PO TABS
7.5000 mg | ORAL_TABLET | Freq: Once | ORAL | Status: AC
  Administered 2014-02-05: 7.5 mg via ORAL
  Filled 2014-02-05: qty 1

## 2014-02-05 MED ORDER — CETYLPYRIDINIUM CHLORIDE 0.05 % MT LIQD
7.0000 mL | Freq: Two times a day (BID) | OROMUCOSAL | Status: DC
  Administered 2014-02-05 – 2014-02-13 (×17): 7 mL via OROMUCOSAL

## 2014-02-05 MED ORDER — CHLORHEXIDINE GLUCONATE CLOTH 2 % EX PADS
6.0000 | MEDICATED_PAD | Freq: Every day | CUTANEOUS | Status: AC
  Administered 2014-02-05 – 2014-02-09 (×4): 6 via TOPICAL

## 2014-02-05 MED ORDER — MUPIROCIN 2 % EX OINT
1.0000 "application " | TOPICAL_OINTMENT | Freq: Two times a day (BID) | CUTANEOUS | Status: AC
  Administered 2014-02-05 – 2014-02-09 (×10): 1 via NASAL
  Filled 2014-02-05: qty 22

## 2014-02-05 NOTE — Care Management Note (Addendum)
    Page 1 of 2   02/25/2014     3:08:22 PM CARE MANAGEMENT NOTE 02/25/2014  Patient:  Gregory Davila,Gregory Davila   Account Number:  [id number]  Date Initiated:  02/05/2014  Documentation initiated by:  Junius CreamerWELL,DEBBIE  Subjective/Objective Assessment:   adm w at fib w rvr, angina, now brady     Action/Plan:   lives w wife, pcp dr Gregory Davila   Anticipated DC Date:     Anticipated DC Plan:  HOME/SELF CARE         Choice offered to / List presented to:             Status of service:   Medicare Important Message given?  YES (If response is "NO", the following Medicare IM given date fields will be blank) Date Medicare IM given:  02/21/2014 Medicare IM given by:  MAYO,HENRIETTA Date Additional Medicare IM given:  02/11/2014 Additional Medicare IM given by:  [facility]  Discharge Disposition:    Per UR Regulation:  Reviewed for med. necessity/level of care/duration of stay  If discussed at Long Length of Stay Meetings, dates discussed:   02/12/2014  02/14/2014  02/19/2014  02/21/2014  02/26/2014    Comments:   Medicare Important Message given?  YES  (If response is "NO", the following Medicare IM given date fields will be blank) Date Medicare IM given:02/25/2014 Medicare IM given by:  Gae GallopOLE,ANGELA          02/18/14 0954 Henrietta Mayo RN MSN BSN CCM AFib w/RVR, on amiodarone gtt, 5L/min [place].  Pt states his wife, sister, and sister-in-law will all be available to assist him as needed when he is medically stable for discharge.  02/15/14 1015 Henrietta Mayo RN MSN BSN CCM To 2S s/p CABG.  Extubated, on 4L/min [place].

## 2014-02-05 NOTE — Progress Notes (Signed)
Notified MD of HR decrease to 38, BP 90/54. Current HR ranges from low 40s to mid 50s.  Pt asymptomatic. Will continue to monitor.

## 2014-02-05 NOTE — Progress Notes (Signed)
ANTICOAGULATION CONSULT NOTE   Pharmacy Consult for Coumadin Indication: atrial fibrillation  No Known Allergies  Patient Measurements: Height: 5\' 11"  (180.3 cm) Weight: 226 lb 6.6 oz (102.7 kg) IBW/kg (Calculated) : 75.3   Vital Signs: Temp: 98 F (36.7 C) (02/02 0800) Temp Source: Oral (02/02 0800) BP: 109/68 mmHg (02/02 1200) Pulse Rate: 71 (02/02 1200)  Labs:  Recent Labs  02/04/14 2041 02/05/14 0148 02/05/14 0750 02/05/14 1020  LABPROT  --   --  >90.0* 22.4*  INR  --   --  >10.00* 1.95*  CREATININE  --  1.71*  --   --   TROPONINI 0.16* 0.18* 0.18*  --     Estimated Creatinine Clearance: 47 mL/min (by C-G formula based on Cr of 1.71).   Medical History: Past Medical History  Diagnosis Date  . Paroxysmal atrial fibrillation     a. On Coumadin therapy/amidarone. b. 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  [name]. CAD (coronary artery disease)     a, 2011: occluded LAD with left to right collaterals and moderate disease in the right coronary artery and circumflex, EF 50%. b. NSTEMI 07/2011 in setting of AF-RVR, MV abnl, cath w/ LAD 100%, RCA mod-severe dz, small vessel, med rx  . Hypertension     long-standing  . Chronic renal insufficiency     followed by primary care physician  . Pulmonary embolism   . Gout   . Anginal pain       Assessment: 74yom with Hx CAD and Afib, PE - on warfarin pta,  admitted to OSH with chest tightness.  Found to be in Afib with RVR and started on diltiazem drip.  His CBC -wnl, Cr 1.7, repeat INR 1.9 this am (>10 reported must have been lab error).     Noted amio stopped this am and changed to ranexa. May require higher coumadin dosing now that off amio.  Goal of Therapy:  INR 2-3 Monitor platelets by anticoagulation protocol: Yes   Plan:  Warfarin 7.5mg  tonight Daily INR  Gregory CoilFrank Davila PharmD., BCPS Clinical Pharmacist Pager [phone] 02/05/2014 2:46 PM

## 2014-02-05 NOTE — Progress Notes (Signed)
Patient ID: KEE DRUDGE, male   DOB: 1940/06/16, 74 y.o.   MRN: [id number]    Subjective:  Denies SSCP, palpitations or Dyspnea   Objective:  Filed Vitals:   02/05/14 0358 02/05/14 0800 02/05/14 0900 02/05/14 1100  BP: 82/58 93/54 118/55 128/63  Pulse: 44 46 50 61  Temp: 98.1 F (36.7 C) 98 F (36.7 C)    TempSrc: Oral Oral    Resp: Height:      Weight:      SpO2: 91% 94% 93% 91%    Intake/Output from previous day:  Intake/Output Summary (Last 24 hours) at 02/05/14 1222 Last data filed at 02/05/14 0400  Gross per 24 hour  Intake      0 ml  Output    425 ml  Net   -425 ml    Physical Exam: Affect appropriate Healthy:  appears stated age HEENT: normal Neck supple with no adenopathy JVP normal no bruits no thyromegaly Lungs clear with no wheezing and good diaphragmatic motion Heart:  S1/S2 SEM  murmur, no rub, gallop or click PMI normal Abdomen: benighn, BS positve, no tenderness, no AAA no bruit.  No HSM or HJR Distal pulses intact with no bruits No edema Neuro non-focal Skin warm and dry No muscular weakness   Lab Results: Basic Metabolic Panel:  Recent Labs  11/91/47 0148  NA 136  K 4.3  CL 106  CO2 25  GLUCOSE 113*  BUN 15  CREATININE 1.71*  CALCIUM 8.6   Liver Function Tests:  Recent Labs  02/05/14 0148  AST 72*  ALT 59*  ALKPHOS 60  BILITOT 0.5  PROT 6.8  ALBUMIN 3.4*   Cardiac Enzymes:  Recent Labs  02/04/14 2041 02/05/14 0148 02/05/14 0750  TROPONINI 0.16* 0.18* 0.18*   Thyroid Function Tests:  Recent Labs  02/04/14 2041  TSH 2.884   Anemia Panel: No results for input(s): VITAMINB12, FOLATE, FERRITIN, TIBC, IRON, RETICCTPCT in the last 72 hours.  Imaging: No results found.  Cardiac Studies:  ECG:    Telemetry:  NSR rte 70 had SB briefly rates in 35 range asymptomatic no AV block  Echo:   Medications:   . amiodarone  100 mg Oral Daily  . antiseptic oral rinse  7 mL Mouth Rinse BID  .  aspirin  81 mg Oral Daily  . Chlorhexidine Gluconate Cloth  6 each Topical Q0600  . diltiazem  300 mg Oral Daily  . isosorbide mononitrate  30 mg Oral Daily  . loratadine  10 mg Oral Daily  . metoprolol tartrate  25 mg Oral BID  . multivitamin with minerals  1 tablet Oral Daily  . mupirocin ointment  1 application Nasal BID  . pravastatin  20 mg Oral QHS  . sodium chloride  3 mL Intravenous Q12H  . warfarin  5 mg Oral q1800  . Warfarin - Pharmacist Dosing Inpatient   Does not apply q1800       Assessment/Plan:  PAF:  On coumadin INR subRx pharmachy dose.  Given CAD and angina will change to ranexa 500 bid and stop amiodarone Will likely be as effective for PAF and have antianginal properties with less long term side effects CAD:  Seen note by Dr Royann Shivers.  Agree chest pain ppt by PAF.  He has not had angina in years.  Has nitro at home but did not take Given bradycardia will d/c cardizem to make room for higher dose beta blocker and ranexa.  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  Low threshold to cath if more angina  Especially if not related to PAF  He would also be a CABG candidate for progressive circumflex disease in my opinion   Charlton Hawseter Nishan 02/05/2014, 12:22 PM

## 2014-02-06 ENCOUNTER — Inpatient Hospital Stay (HOSPITAL_COMMUNITY): Payer: Medicare Other

## 2014-02-06 DIAGNOSIS — I25118 Atherosclerotic heart disease of native coronary artery with other forms of angina pectoris: Secondary | ICD-10-CM

## 2014-02-06 LAB — BASIC METABOLIC PANEL
Anion gap: 5 (ref 5–15)
BUN: 20 mg/dL (ref 6–23)
CO2: 30 mmol/L (ref 19–32)
Calcium: 9.1 mg/dL (ref 8.4–10.5)
Chloride: 102 mmol/L (ref 96–112)
Creatinine, Ser: 1.82 mg/dL — ABNORMAL HIGH (ref 0.50–1.35)
GFR calc Af Amer: 41 mL/min — ABNORMAL LOW (ref >90)
GFR calc non Af Amer: 35 mL/min — ABNORMAL LOW (ref >90)
Glucose, Bld: 119 mg/dL — ABNORMAL HIGH (ref 70–99)
Potassium: 5 mmol/L (ref 3.5–5.1)
Sodium: 137 mmol/L (ref 135–145)

## 2014-02-06 LAB — PROTIME-INR
INR: 2.06 — ABNORMAL HIGH (ref 0.00–1.49)
Prothrombin Time: 23.4 seconds — ABNORMAL HIGH (ref 11.6–15.2)

## 2014-02-06 LAB — D-DIMER, QUANTITATIVE: D-Dimer, Quant: 0.47 ug/mL-FEU (ref 0.00–0.48)

## 2014-02-06 MED ORDER — WARFARIN SODIUM 7.5 MG PO TABS
7.5000 mg | ORAL_TABLET | Freq: Once | ORAL | Status: AC
  Administered 2014-02-06: 7.5 mg via ORAL
  Filled 2014-02-06: qty 1

## 2014-02-06 MED FILL — Heparin Sodium (Porcine) 100 Unt/ML in Sodium Chloride 0.45%: INTRAMUSCULAR | Qty: 250 | Status: AC

## 2014-02-06 NOTE — Progress Notes (Signed)
Progress Note  Subjective:   Day of hospitalization: 2  VSS.  No overnight events.     Objective:   Vital signs in last 24 hours: Filed Vitals:   02/05/14 1638 02/05/14 2000 02/05/14 2338 02/06/14 0348  BP: 117/70 135/84 126/75 111/74  Pulse: 74 72 71 61  Temp: 98.2 F (36.8 C) 98.4 F (36.9 C) 97.6 F (36.4 C) 98 F (36.7 C)  TempSrc: Oral Oral Oral Oral  Resp: Height:      Weight:    103.3 kg (227 lb 11.8 oz)  SpO2: 91% 90% 92% 91%    Weight: Filed Weights   02/04/14 1651 02/05/14 0313 02/06/14 0348  Weight: 103.5 kg (228 lb 2.8 oz) 102.7 kg (226 lb 6.6 oz) 103.3 kg (227 lb 11.8 oz)    I/Os:  Intake/Output Summary (Last 24 hours) at 02/06/14 0721 Last data filed at 02/05/14 1700  Gross per 24 hour  Intake    440 ml  Output    900 ml  Net   -460 ml    Physical Exam: Constitutional: Vital signs reviewed.  Pt in NAD and cooperative with exam.   HEENT: [place]/AT; PERRL, EOMI, conjunctivae normal, no scleral icterus  Cardiovascular: RRR, no MRG Pulmonary/Chest: normal respiratory effort, no accessory muscle use, CTAB, no wheezes, rales, or rhonchi Abdominal: Soft. +BS, NT/ND Neurological: A&O x3, CN II-XII grossly intact; non-focal exam Extremities: 2+DP b/l, no C/C/E  Skin: Warm, dry and intact.   Lab Results:  BMP:  Recent Labs Lab 02/05/14 0148  NA 136  K 4.3  CL 106  CO2 25  GLUCOSE 113*  BUN 15  CREATININE 1.71*  CALCIUM 8.6    CBC: No results for input(s): WBC, NEUTROABS, HGB, HCT, MCV, PLT in the last 168 hours.  Coagulation:  Recent Labs Lab 02/05/14 0750 02/05/14 1020 02/06/14 0310  LABPROT >90.0* 22.4* 23.4*  INR >10.00* 1.95* 2.06*    CBG:           No results for input(s): GLUCAP in the last 168 hours.         HA1C:      No results for input(s): HGBA1C in the last 168 hours.  Lipid Panel: No results for input(s): CHOL, HDL, LDLCALC, TRIG, CHOLHDL, LDLDIRECT in the last 168 hours.  LFTs:  Recent Labs Lab  02/05/14 0148  AST 72*  ALT 59*  ALKPHOS 60  BILITOT 0.5  PROT 6.8  ALBUMIN 3.4*    Pancreatic Enzymes: No results for input(s): LIPASE, AMYLASE in the last 168 hours.  Lactic Acid/Procalcitonin: No results for input(s): LATICACIDVEN, PROCALCITON in the last 168 hours.  Ammonia: No results for input(s): AMMONIA in the last 168 hours.  Cardiac Enzymes:  Recent Labs Lab 02/04/14 2041 02/05/14 0148 02/05/14 0750  TROPONINI 0.16* 0.18* 0.18*    EKG: EKG Interpretation  Date/Time:    Ventricular Rate:    PR Interval:    QRS Duration:   QT Interval:    QTC Calculation:   R Axis:     Text Interpretation:     BNP: No results for input(s): PROBNP in the last 168 hours.  D-Dimer: No results for input(s): DDIMER in the last 168 hours.  Urinalysis: No results for input(s): COLORURINE, LABSPEC, PHURINE, GLUCOSEU, HGBUR, BILIRUBINUR, KETONESUR, PROTEINUR, UROBILINOGEN, NITRITE, LEUKOCYTESUR in the last 168 hours.  Invalid input(s): APPERANCEUR  Micro Results: Recent Results (from the past 240 hour(s))  MRSA PCR Screening     Status: Abnormal  Collection Time: 02/04/14  4:50 PM  Result Value Ref Range Status   MRSA by PCR POSITIVE (A) NEGATIVE Final    Comment:        The GeneXpert MRSA Assay (FDA approved for NASAL specimens only), is one component of a comprehensive MRSA colonization surveillance program. It is not intended to diagnose MRSA infection nor to guide or monitor treatment for MRSA infections. RESULT CALLED TO, READ BACK BY AND VERIFIED WITH: M,MCNEIL,RN AT 2055 BY L.PITT 02/04/14     Blood Culture: No results found for: SDES, SPECREQUEST, CULT, REPTSTATUS  Studies/Results: No results found.  Medications:  Scheduled Meds: . antiseptic oral rinse  7 mL Mouth Rinse BID  . aspirin  81 mg Oral Daily  . Chlorhexidine Gluconate Cloth  6 each Topical Q0600  . isosorbide mononitrate  30 mg Oral Daily  . loratadine  10 mg Oral Daily  .  metoprolol tartrate  25 mg Oral BID  . multivitamin with minerals  1 tablet Oral Daily  . mupirocin ointment  1 application Nasal BID  . pravastatin  20 mg Oral QHS  . ranolazine  500 mg Oral BID  . sodium chloride  3 mL Intravenous Q12H  . Warfarin - Pharmacist Dosing Inpatient   Does not apply q1800   Continuous Infusions:  PRN Meds: sodium chloride, acetaminophen, ALPRAZolam, colchicine, nitroGLYCERIN, ondansetron (ZOFRAN) IV, sodium chloride, zolpidem  Antibiotics: Antibiotics Given (last 72 hours)    None      Day of Hospitalization: 2  Consults:    Assessment/Plan:    1.  PAF- On coumadin, INR therapeutic.  HR controlled.  Cont ranexa 500 bid and metoprolol.    2.  CAD- Seen note by Dr Royann Shiversroitoru.  CP likely precipitated by PAF.  Troponins 0.16-->0.18.  Would  have low threshold to cath if more angina, especially if not related to PAF.  3.  HTN- Controlled.    LOS: 2 days   Marrian SalvageJacquelyn S Gill, MD PGY-2, Internal Medicine Teaching Service 02/06/2014, 7:21 AM    No angina  sats low Not clear why distant smoker Lung exam ok  Check d dimer and BNP  CXR.  Maint NSR Now on ranexa.  Possible d/c in 48 hours if sats better, maint NSR and no angina  Exam remarkable for clear Lungs and SEM    Charlton HawsPeter Anasofia Micallef

## 2014-02-06 NOTE — Progress Notes (Signed)
ANTICOAGULATION CONSULT NOTE   Pharmacy Consult for Coumadin Indication: atrial fibrillation  No Known Allergies  Patient Measurements: Height: 5\' 11"  (180.3 cm) Weight: 227 lb 11.8 oz (103.3 kg) IBW/kg (Calculated) : 75.3   Vital Signs: Temp: 97.7 F (36.5 C) (02/03 0758) Temp Source: Oral (02/03 0758) BP: 153/97 mmHg (02/03 0800) Pulse Rate: 70 (02/03 0800)  Labs:  Recent Labs  02/04/14 2041 02/05/14 0148 02/05/14 0750 02/05/14 1020 02/06/14 0310  LABPROT  --   --  >90.0* 22.4* 23.4*  INR  --   --  >10.00* 1.95* 2.06*  CREATININE  --  1.71*  --   --   --   TROPONINI 0.16* 0.18* 0.18*  --   --     Estimated Creatinine Clearance: 47.1 mL/min (by C-G formula based on Cr of 1.71).   Medical History: Past Medical History  Diagnosis Date  . Paroxysmal atrial fibrillation     a. On Coumadin therapy/amidarone. b. 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  [name]. CAD (coronary artery disease)     a, 2011: occluded LAD with left to right collaterals and moderate disease in the right coronary artery and circumflex, EF 50%. b. NSTEMI 07/2011 in setting of AF-RVR, MV abnl, cath w/ LAD 100%, RCA mod-severe dz, small vessel, med rx  . Hypertension     long-standing  . Chronic renal insufficiency     followed by primary care physician  . Pulmonary embolism   . Gout   . Anginal pain       Assessment: 74yom with Hx CAD and Afib, PE - on warfarin pta,  admitted to OSH with chest tightness.  Found to be in Afib with RVR and started on diltiazem drip which is now off.  His CBC wnl at OSH, Cr 1.7, INR 2.0 this am. No issues noted.  Noted amio stopped 2/2 and changed to ranexa. May require higher coumadin dosing now that off amio.  Goal of Therapy:  INR 2-3 Monitor platelets by anticoagulation protocol: Yes   Plan:  Repeat Warfarin 7.5 mg tonight Daily INR  Sheppard CoilFrank Wilson PharmD., BCPS Clinical Pharmacist Pager [phone] 02/06/2014 9:07 AM

## 2014-02-07 LAB — BRAIN NATRIURETIC PEPTIDE: B Natriuretic Peptide: 72.3 pg/mL (ref 0.0–100.0)

## 2014-02-07 LAB — PROTIME-INR
INR: 2.22 — ABNORMAL HIGH (ref 0.00–1.49)
INR: 2.33 — ABNORMAL HIGH (ref 0.00–1.49)
Prothrombin Time: 24.8 seconds — ABNORMAL HIGH (ref 11.6–15.2)
Prothrombin Time: 25.8 seconds — ABNORMAL HIGH (ref 11.6–15.2)

## 2014-02-07 LAB — TROPONIN I: Troponin I: 0.05 ng/mL — ABNORMAL HIGH (ref <0.031)

## 2014-02-07 MED ORDER — SODIUM CHLORIDE 0.9 % IJ SOLN
3.0000 mL | Freq: Two times a day (BID) | INTRAMUSCULAR | Status: DC
  Administered 2014-02-07 – 2014-02-11 (×9): 3 mL via INTRAVENOUS

## 2014-02-07 MED ORDER — SODIUM CHLORIDE 0.9 % IJ SOLN: 3.0000 mL | INTRAMUSCULAR | Status: DC | PRN

## 2014-02-07 MED ORDER — VITAMIN K1 10 MG/ML IJ SOLN
5.0000 mg | Freq: Once | INTRAMUSCULAR | Status: AC
  Administered 2014-02-07: 5 mg via SUBCUTANEOUS
  Filled 2014-02-07: qty 0.5

## 2014-02-07 MED ORDER — SODIUM CHLORIDE 0.9 % IV SOLN: 250.0000 mL | INTRAVENOUS | Status: DC | PRN

## 2014-02-07 MED ORDER — GI COCKTAIL ~~LOC~~: 30.0000 mL | Freq: Two times a day (BID) | ORAL | Status: DC | PRN

## 2014-02-07 MED ORDER — ASPIRIN 81 MG PO CHEW
81.0000 mg | CHEWABLE_TABLET | ORAL | Status: AC
  Administered 2014-02-08: 81 mg via ORAL
  Filled 2014-02-07: qty 1

## 2014-02-07 NOTE — Progress Notes (Signed)
Progress Note  Subjective:   Day of hospitalization: 3  Pt reports exertional CP early this AM.  Describes as "tightness."  States it feels similar to the pain he felt when he was admitted.  Also endorses some nausea and lightheadedness but no SOB.  No longer requiring O2.  CXR shows atx without edema or other acute process.  D-dimer neg.    Objective:   Vital signs in last 24 hours: Filed Vitals:   02/06/14 1920 02/06/14 2351 02/07/14 0328 02/07/14 0500  BP: 137/71 138/78 159/93   Pulse: 84 71 76   Temp: 98.7 F (37.1 C) 98.2 F (36.8 C) 98.5 F (36.9 C)   TempSrc: Oral Oral Oral   Resp: Height:      Weight:    101.7 kg (224 lb 3.3 oz)  SpO2: 97% 96% 96%     Weight: Filed Weights   02/05/14 0313 02/06/14 0348 02/07/14 0500  Weight: 102.7 kg (226 lb 6.6 oz) 103.3 kg (227 lb 11.8 oz) 101.7 kg (224 lb 3.3 oz)    I/Os:  Intake/Output Summary (Last 24 hours) at 02/07/14 0757 Last data filed at 02/07/14 0600  Gross per 24 hour  Intake    240 ml  Output   2450 ml  Net  -2210 ml    Physical Exam: Constitutional: Vital signs reviewed.  Pt in NAD and cooperative with exam.   HEENT: [place]/AT; PERRL, EOMI, conjunctivae normal, no scleral icterus  Cardiovascular: RRR, no MRG Pulmonary/Chest: normal respiratory effort, no accessory muscle use, CTAB, no wheezes, rales, or rhonchi Abdominal: Soft. +BS, NT/ND Neurological: A&O x3, CN II-XII grossly intact; non-focal exam Extremities: 2+DP b/l, no C/C/E  Skin: Warm, dry and intact.   Lab Results:  BMP:  Recent Labs Lab 02/05/14 0148 02/06/14 0852  NA 136 137  K 4.3 5.0  CL 106 102  CO2 25 30  GLUCOSE 113* 119*  BUN 15 20  CREATININE 1.71* 1.82*  CALCIUM 8.6 9.1    CBC: No results for input(s): WBC, NEUTROABS, HGB, HCT, MCV, PLT in the last 168 hours.  Coagulation:  Recent Labs Lab 02/05/14 0750 02/05/14 1020 02/06/14 0310 02/07/14 0320  LABPROT >90.0* 22.4* 23.4* 24.8*  INR >10.00* 1.95* 2.06*  2.22*    CBG:           No results for input(s): GLUCAP in the last 168 hours.         HA1C:      No results for input(s): HGBA1C in the last 168 hours.  Lipid Panel: No results for input(s): CHOL, HDL, LDLCALC, TRIG, CHOLHDL, LDLDIRECT in the last 168 hours.  LFTs:  Recent Labs Lab 02/05/14 0148  AST 72*  ALT 59*  ALKPHOS 60  BILITOT 0.5  PROT 6.8  ALBUMIN 3.4*    Pancreatic Enzymes: No results for input(s): LIPASE, AMYLASE in the last 168 hours.  Lactic Acid/Procalcitonin: No results for input(s): LATICACIDVEN, PROCALCITON in the last 168 hours.  Ammonia: No results for input(s): AMMONIA in the last 168 hours.  Cardiac Enzymes:  Recent Labs Lab 02/04/14 2041 02/05/14 0148 02/05/14 0750  TROPONINI 0.16* 0.18* 0.18*    EKG: EKG Interpretation  Date/Time:    Ventricular Rate:    PR Interval:    QRS Duration:   QT Interval:    QTC Calculation:   R Axis:     Text Interpretation:     BNP: No results for input(s): PROBNP in the last 168 hours.  D-Dimer:  Recent Labs Lab 02/06/14 0852  DDIMER 0.47    Urinalysis: No results for input(s): COLORURINE, LABSPEC, PHURINE, GLUCOSEU, HGBUR, BILIRUBINUR, KETONESUR, PROTEINUR, UROBILINOGEN, NITRITE, LEUKOCYTESUR in the last 168 hours.  Invalid input(s): APPERANCEUR  Micro Results: Recent Results (from the past 240 hour(s))  MRSA PCR Screening     Status: Abnormal   Collection Time: 02/04/14  4:50 PM  Result Value Ref Range Status   MRSA by PCR POSITIVE (A) NEGATIVE Final    Comment:        The GeneXpert MRSA Assay (FDA approved for NASAL specimens only), is one component of a comprehensive MRSA colonization surveillance program. It is not intended to diagnose MRSA infection nor to guide or monitor treatment for MRSA infections. RESULT CALLED TO, READ BACK BY AND VERIFIED WITH: M,MCNEIL,RN AT 2055 BY L.PITT 02/04/14     Blood Culture: No results found for: SDES, SPECREQUEST, CULT,  REPTSTATUS  Studies/Results: Dg Chest 2 View  02/06/2014   CLINICAL DATA:  Shortness of breath. Atrial fibrillation. Initial encounter.  EXAM: CHEST  2 VIEW  COMPARISON:  02/03/2014 radiographs.  FINDINGS: 0922 hr. The heart size and mediastinal contours are stable. There is stable linear bibasilar atelectasis or scarring. No edema, confluent airspace opacity or significant pleural effusion demonstrated. The bones appear unchanged.  IMPRESSION: Stable linear bibasilar atelectasis or scarring. No evidence of edema or acute process.   Electronically Signed   By: Roxy HorsemanBill  Veazey M.D.   On: 02/06/2014 10:10    Medications:  Scheduled Meds: . antiseptic oral rinse  7 mL Mouth Rinse BID  . aspirin  81 mg Oral Daily  . Chlorhexidine Gluconate Cloth  6 each Topical Q0600  . isosorbide mononitrate  30 mg Oral Daily  . loratadine  10 mg Oral Daily  . metoprolol tartrate  25 mg Oral BID  . multivitamin with minerals  1 tablet Oral Daily  . mupirocin ointment  1 application Nasal BID  . pravastatin  20 mg Oral QHS  . ranolazine  500 mg Oral BID  . sodium chloride  3 mL Intravenous Q12H  . Warfarin - Pharmacist Dosing Inpatient   Does not apply q1800   Continuous Infusions:  PRN Meds: sodium chloride, acetaminophen, ALPRAZolam, colchicine, gi cocktail, nitroGLYCERIN, ondansetron (ZOFRAN) IV, sodium chloride, zolpidem  Antibiotics: Antibiotics Given (last 72 hours)    None      Day of Hospitalization: 3  Consults:    Assessment/Plan:    1.  PAF- On coumadin, INR therapeutic.  HR controlled.  Cont ranexa 500 bid and metoprolol.    2.  CAD- Endorses CP this AM.  Was previously thought CP likely precipitated by PAF by no longer in afib.  Trops initially  0.16-->0.18.  Will obtain stat EKG and troponin.  Would consider cath since having CP not in the setting of afib.  Will also try GI cocktail PRN but symptoms not c/w GERD.    3.  HTN- Continue current meds.    4.  Hypoxemia- Resolved.   Had required O2 here but is not on O2 chronically.  Unclear why--lungs clear, CXR shows atx, d-dimer neg, BNP 72.3.  Former smoker ~50 years ago.      LOS: 3 days   Marrian SalvageJacquelyn S Gill, MD PGY-2, Internal Medicine Teaching Service 02/07/2014, 7:57 AM  Patient having exertional angina despite change and maximal medical Rx.  No rest pain and angina not related to PAF Discussed decision to cath as left circumflex would be amenable to PCI  Unfortunately may not get done till Monday As his coumadin level is 2.7.  Will check INR in am.  Risks discussed willing to proceed.  Had increased beta blocker On nitrates and started ranexa.  Charlton Haws

## 2014-02-07 NOTE — Progress Notes (Addendum)
ANTICOAGULATION CONSULT NOTE   Pharmacy Consult for Coumadin>>heparin (when INR<2) Indication: atrial fibrillation  No Known Allergies  Patient Measurements: Height: 5\' 11"  (180.3 cm) Weight: 224 lb 3.3 oz (101.7 kg) IBW/kg (Calculated) : 75.3   Vital Signs: Temp: 97.8 F (36.6 C) (02/04 0813) Temp Source: Oral (02/04 0813) BP: 149/89 mmHg (02/04 0813) Pulse Rate: 76 (02/04 0328)  Labs:  Recent Labs  02/05/14 0148  02/05/14 0750 02/05/14 1020 02/06/14 0310 02/06/14 0852 02/07/14 0320 02/07/14 0850  LABPROT  --   < > >90.0* 22.4* 23.4*  --  24.8*  --   INR  --   < > >10.00* 1.95* 2.06*  --  2.22*  --   CREATININE 1.71*  --   --   --   --  1.82*  --   --   TROPONINI 0.18*  --  0.18*  --   --   --   --  0.05*  < > = values in this interval not displayed.  Estimated Creatinine Clearance: 43.9 mL/min (by C-G formula based on Cr of 1.82).   Medical History: Past Medical History  Diagnosis Date  . Paroxysmal atrial fibrillation     a. On Coumadin therapy/amidarone. b. 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  [name]. CAD (coronary artery disease)     a, 2011: occluded LAD with left to right collaterals and moderate disease in the right coronary artery and circumflex, EF 50%. b. NSTEMI 07/2011 in setting of AF-RVR, MV abnl, cath w/ LAD 100%, RCA mod-severe dz, small vessel, med rx  . Hypertension     long-standing  . Chronic renal insufficiency     followed by primary care physician  . Pulmonary embolism   . Gout   . Anginal pain       Assessment: 74yom with Hx CAD and Afib, PE - on warfarin pta,  admitted to OSH with chest tightness.  Found to be in Afib with RVR and started on diltiazem drip which is now off.  Converted to NSR. CP this am, CE's have trended down (0.05 this am). Plan is for cath when INR is down (2.2 this am). No recent cbc to assess trend. No bleeding issues noted.  Orders to start heparin when INR <2. Hold warfarin for  now. Assess INR in am and order heparin if appropriate.  Noted amio stopped on 2/2 and changed to ranexa. May require higher coumadin dosing now that off amio.  Goal of Therapy:  INR 2-3 Monitor platelets by anticoagulation protocol: Yes   Plan:  INR tonight at 8p - start heparin when <2 CBC in am 5mg  sq vitamin k ordered by cardiology  Sheppard CoilFrank Kaari Zeigler PharmD., BCPS Clinical Pharmacist Pager [phone] 02/07/2014 10:29 AM

## 2014-02-08 ENCOUNTER — Encounter (HOSPITAL_COMMUNITY): Payer: Self-pay | Admitting: Cardiovascular Disease

## 2014-02-08 ENCOUNTER — Encounter (HOSPITAL_COMMUNITY)
Admission: AD | Disposition: A | Discharge status: Home / Self Care | Payer: Self-pay | Source: Other Acute Inpatient Hospital | Attending: Cardiothoracic Surgery

## 2014-02-08 ENCOUNTER — Other Ambulatory Visit: Payer: Self-pay | Admitting: *Deleted

## 2014-02-08 DIAGNOSIS — I2510 Atherosclerotic heart disease of native coronary artery without angina pectoris: Secondary | ICD-10-CM

## 2014-02-08 HISTORY — PX: LEFT HEART CATHETERIZATION WITH CORONARY ANGIOGRAM: SHX5451

## 2014-02-08 LAB — BASIC METABOLIC PANEL
Anion gap: 4 — ABNORMAL LOW (ref 5–15)
BUN: 21 mg/dL (ref 6–23)
CO2: 29 mmol/L (ref 19–32)
Calcium: 9.4 mg/dL (ref 8.4–10.5)
Chloride: 103 mmol/L (ref 96–112)
Creatinine, Ser: 1.76 mg/dL — ABNORMAL HIGH (ref 0.50–1.35)
GFR calc Af Amer: 42 mL/min — ABNORMAL LOW (ref >90)
GFR calc non Af Amer: 37 mL/min — ABNORMAL LOW (ref >90)
Glucose, Bld: 100 mg/dL — ABNORMAL HIGH (ref 70–99)
Potassium: 4.8 mmol/L (ref 3.5–5.1)
Sodium: 136 mmol/L (ref 135–145)

## 2014-02-08 LAB — CBC
HCT: 43.4 % (ref 39.0–52.0)
Hemoglobin: 14.5 g/dL (ref 13.0–17.0)
MCH: 29.4 pg (ref 26.0–34.0)
MCHC: 33.4 g/dL (ref 30.0–36.0)
MCV: 88 fL (ref 78.0–100.0)
Platelets: 256 10*3/uL (ref 150–400)
RBC: 4.93 MIL/uL (ref 4.22–5.81)
RDW: 14.3 % (ref 11.5–15.5)
WBC: 13.2 10*3/uL — ABNORMAL HIGH (ref 4.0–10.5)

## 2014-02-08 LAB — PROTIME-INR
INR: 2.27 — ABNORMAL HIGH (ref 0.00–1.49)
INR: 1.96 — ABNORMAL HIGH (ref 0.00–1.49)
Prothrombin Time: 25.3 seconds — ABNORMAL HIGH (ref 11.6–15.2)
Prothrombin Time: 22.5 seconds — ABNORMAL HIGH (ref 11.6–15.2)

## 2014-02-08 SURGERY — LEFT HEART CATHETERIZATION WITH CORONARY ANGIOGRAM

## 2014-02-08 MED ORDER — SODIUM CHLORIDE 0.9 % IV SOLN
INTRAVENOUS | Status: DC
  Administered 2014-02-08 – 2014-02-11 (×6): via INTRAVENOUS

## 2014-02-08 MED ORDER — SODIUM CHLORIDE 0.9 % IV SOLN
INTRAVENOUS | Status: DC
Start: 2014-02-08 — End: 2014-02-09
  Administered 2014-02-08: 11:00:00 via INTRAVENOUS

## 2014-02-08 MED ORDER — VITAMIN K1 10 MG/ML IJ SOLN
10.0000 mg | Freq: Once | INTRAMUSCULAR | Status: AC
  Administered 2014-02-08: 10 mg via SUBCUTANEOUS
  Filled 2014-02-08: qty 1

## 2014-02-08 MED ORDER — HEPARIN (PORCINE) IN NACL 100-0.45 UNIT/ML-% IJ SOLN
1550.0000 [IU]/h | INTRAMUSCULAR | Status: DC
  Administered 2014-02-08: 1300 [IU]/h via INTRAVENOUS
  Administered 2014-02-09 – 2014-02-13 (×7): 1550 [IU]/h via INTRAVENOUS
  Filled 2014-02-08 (×17): qty 250

## 2014-02-08 NOTE — Progress Notes (Addendum)
ANTICOAGULATION CONSULT NOTE   Pharmacy Consult for Coumadin>>heparin (when INR<2) Indication: atrial fibrillation  No Known Allergies  Patient Measurements: Height: 5\' 11"  (180.3 cm) Weight: 224 lb 3.3 oz (101.7 kg) IBW/kg (Calculated) : 75.3   Vital Signs: Temp: 97.6 F (36.4 C) (02/05 0809) Temp Source: Oral (02/05 0809) BP: 142/85 mmHg (02/05 0809)  Labs:  Recent Labs  02/06/14 [id number] 02/07/14 0320 02/07/14 0850 02/07/14 1940 02/08/14 0250  HGB  --   --   --   --  14.5  HCT  --   --   --   --  43.4  PLT  --   --   --   --  256  LABPROT  --  24.8*  --  25.8* 25.3*  INR  --  2.22*  --  2.33* 2.27*  CREATININE 1.82*  --   --   --   --   TROPONINI  --   --  0.05*  --   --     Estimated Creatinine Clearance: 43.9 mL/min (by C-G formula based on Cr of 1.82).   Medical History: Past Medical History  Diagnosis Date  . Paroxysmal atrial fibrillation     a. On Coumadin therapy/amidarone. b. 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  [name]. CAD (coronary artery disease)     a, 2011: occluded LAD with left to right collaterals and moderate disease in the right coronary artery and circumflex, EF 50%. b. NSTEMI 07/2011 in setting of AF-RVR, MV abnl, cath w/ LAD 100%, RCA mod-severe dz, small vessel, med rx  . Hypertension     long-standing  . Chronic renal insufficiency     followed by primary care physician  . Pulmonary embolism   . Gout   . Anginal pain       Assessment: 74yom with Hx CAD and Afib, PE - on warfarin pta,  admitted to OSH with chest tightness.  Found to be in Afib with RVR and started on diltiazem drip which is now off.  Converted to NSR. CP this am, CE's have trended down (0.05 this am). Plan is for cath when INR is down (2.2 this am) after 5mg  vit k given 2/4. Will repeat vit k this morning and recheck INR this afternoon for possible cath.   No bleeding issues noted, cbc normal this am.  Orders to start heparin when INR <2  if cath delayed over weekend.  Noted amio stopped on 2/2 and changed to ranexa. May require higher coumadin dosing now that off amio.  Goal of Therapy:  INR 2-3 Monitor platelets by anticoagulation protocol: Yes   Plan:  INR tonight at 1400 10 mg sq vitamin k ordered by cardiology Heparin if cath delayed over weekend  Sheppard CoilFrank Wilson PharmD., BCPS Clinical Pharmacist Pager [phone] 02/08/2014 8:10 AM    Addendum -Heparin to be resumed post cath -Pt is on warfarin PTA, INR was 1.9 at noon today, pt rec'd Vitamin 10 mg x1 this morning -- full effect from this won't be seen for 12-24 hrs, INR is likely lower at this time -Heparin will be started 2 hours after TR band is removed -Heparin dosing weight: 96.4 kg -Heparin 1300 units/hr  -- no initial bolus -Daily HL, CBC -F/u confirmatory level  -F/u plans for PCI vs CABG   Agapito GamesAlison Ryin Schillo, PharmD, BCPS Clinical Pharmacist Pager: [phone] 02/08/2014 4:28 PM

## 2014-02-08 NOTE — Progress Notes (Signed)
Patient ID: Gregory Davila, male   DOB: 16-Apr-1940, 74 y.o.   MRN: [id number] Progress Note  Subjective:   Day of hospitalization: 4  No angina this am  Got some Vit K last night but INR still 2.27     Objective:   Vital signs in last 24 hours: Filed Vitals:   02/08/14 0607 02/08/14 0700 02/08/14 0800 02/08/14 0809  BP: 141/90   142/85  Pulse:      Temp: 98.1 F (36.7 C)   97.6 F (36.4 C)  TempSrc: Oral   Oral  Resp: Height:      Weight: 101.7 kg (224 lb 3.3 oz)     SpO2: 92%   94%    Weight: Filed Weights   02/06/14 0348 02/07/14 0500 02/08/14 0607  Weight: 103.3 kg (227 lb 11.8 oz) 101.7 kg (224 lb 3.3 oz) 101.7 kg (224 lb 3.3 oz)    I/Os:  Intake/Output Summary (Last 24 hours) at 02/08/14 0837 Last data filed at 02/08/14 0330  Gross per 24 hour  Intake    960 ml  Output   1750 ml  Net   -790 ml    Physical Exam: Constitutional: Vital signs reviewed.  Pt in NAD and cooperative with exam.   HEENT: [place]/AT; PERRL, EOMI, conjunctivae normal, no scleral icterus  Cardiovascular: RRR, no MRG Pulmonary/Chest: normal respiratory effort, no accessory muscle use, CTAB, no wheezes, rales, or rhonchi Abdominal: Soft. +BS, NT/ND Neurological: A&O x3, CN II-XII grossly intact; non-focal exam Extremities: 2+DP b/l, no C/C/E  Skin: Warm, dry and intact.   Lab Results:  BMP:  Recent Labs Lab 02/05/14 0148 02/06/14 0852  NA 136 137  K 4.3 5.0  CL 106 102  CO2 25 30  GLUCOSE 113* 119*  BUN 15 20  CREATININE 1.71* 1.82*  CALCIUM 8.6 9.1    CBC:  Recent Labs Lab 02/08/14 0250  WBC 13.2*  HGB 14.5  HCT 43.4  MCV 88.0  PLT 256    Coagulation:  Recent Labs Lab 02/06/14 0310 02/07/14 0320 02/07/14 1940 02/08/14 0250  LABPROT 23.4* 24.8* 25.8* 25.3*  INR 2.06* 2.22* 2.33* 2.27*    CBG:           No results for input(s): GLUCAP in the last 168 hours.         HA1C:      No results for input(s): HGBA1C in the last 168 hours.  Lipid  Panel: No results for input(s): CHOL, HDL, LDLCALC, TRIG, CHOLHDL, LDLDIRECT in the last 168 hours.  LFTs:  Recent Labs Lab 02/05/14 0148  AST 72*  ALT 59*  ALKPHOS 60  BILITOT 0.5  PROT 6.8  ALBUMIN 3.4*    Pancreatic Enzymes: No results for input(s): LIPASE, AMYLASE in the last 168 hours.  Lactic Acid/Procalcitonin: No results for input(s): LATICACIDVEN, PROCALCITON in the last 168 hours.  Ammonia: No results for input(s): AMMONIA in the last 168 hours.  Cardiac Enzymes:  Recent Labs Lab 02/05/14 0148 02/05/14 0750 02/07/14 0850  TROPONINI 0.18* 0.18* 0.05*    EKG: EKG Interpretation  Date/Time:    Ventricular Rate:    PR Interval:    QRS Duration:   QT Interval:    QTC Calculation:   R Axis:     Text Interpretation:     BNP: No results for input(s): PROBNP in the last 168 hours.  D-Dimer:  Recent Labs Lab 02/06/14 0852  DDIMER 0.47    Urinalysis: No results  for input(s): COLORURINE, LABSPEC, PHURINE, GLUCOSEU, HGBUR, BILIRUBINUR, KETONESUR, PROTEINUR, UROBILINOGEN, NITRITE, LEUKOCYTESUR in the last 168 hours.  Invalid input(s): APPERANCEUR  Micro Results: Recent Results (from the past 240 hour(s))  MRSA PCR Screening     Status: Abnormal   Collection Time: 02/04/14  4:50 PM  Result Value Ref Range Status   MRSA by PCR POSITIVE (A) NEGATIVE Final    Comment:        The GeneXpert MRSA Assay (FDA approved for NASAL specimens only), is one component of a comprehensive MRSA colonization surveillance program. It is not intended to diagnose MRSA infection nor to guide or monitor treatment for MRSA infections. RESULT CALLED TO, READ BACK BY AND VERIFIED WITH: M,MCNEIL,RN AT 2055 BY L.PITT 02/04/14     Blood Culture: No results found for: SDES, SPECREQUEST, CULT, REPTSTATUS  Studies/Results: Dg Chest 2 View  02/06/2014   CLINICAL DATA:  Shortness of breath. Atrial fibrillation. Initial encounter.  EXAM: CHEST  2 VIEW  COMPARISON:   02/03/2014 radiographs.  FINDINGS: 0922 hr. The heart size and mediastinal contours are stable. There is stable linear bibasilar atelectasis or scarring. No edema, confluent airspace opacity or significant pleural effusion demonstrated. The bones appear unchanged.  IMPRESSION: Stable linear bibasilar atelectasis or scarring. No evidence of edema or acute process.   Electronically Signed   By: Roxy HorsemanBill  Veazey M.D.   On: 02/06/2014 10:10    Medications:  Scheduled Meds: . antiseptic oral rinse  7 mL Mouth Rinse BID  . aspirin  81 mg Oral Daily  . Chlorhexidine Gluconate Cloth  6 each Topical Q0600  . isosorbide mononitrate  30 mg Oral Daily  . loratadine  10 mg Oral Daily  . metoprolol tartrate  25 mg Oral BID  . multivitamin with minerals  1 tablet Oral Daily  . mupirocin ointment  1 application Nasal BID  . phytonadione  10 mg Subcutaneous Once  . pravastatin  20 mg Oral QHS  . ranolazine  500 mg Oral BID  . sodium chloride  3 mL Intravenous Q12H  . sodium chloride  3 mL Intravenous Q12H   Continuous Infusions:  PRN Meds: sodium chloride, sodium chloride, acetaminophen, ALPRAZolam, colchicine, gi cocktail, nitroGLYCERIN, ondansetron (ZOFRAN) IV, sodium chloride, sodium chloride, zolpidem  Antibiotics: Antibiotics Given (last 72 hours)    None      Day of Hospitalization: 4  Consults:    Assessment/Plan:    1.  PAF- Was on coumadin, being held for cath Vit K again this morning and if INR less than 1.8 cath latter this afternoon Have called lab to have them check Orders written   .  HR controlled.  Cont ranexa 500 bid and metoprolol.    2.  CAD- Endorses CP even when not in rapid afib   Was previously thought CP likely precipitated by PAF by no longer in afib.  Trops initially  0.16-->0.18. Chronic occulusion LAD With collaterals diffuse RCA not amenable to PCI but large circumflex system could be intervened on and had moderate disease last cath  3.  HTN- Continue current  meds.    4.  Hypoxemia- Resolved.  Had required O2 here but is not on O2 chronically.  Unclear why--lungs clear, CXR shows atx, d-dimer neg, BNP 72.3.  Former smoker ~50 years ago.      Charlton HawsPeter Nishan

## 2014-02-08 NOTE — CV Procedure (Signed)
    Cardiac Catheterization Procedure Note  Name: Gregory MapleRalph D Davila MRN: [id number] DOB: 1940/02/22  Procedure: Left Heart Cath, Selective Coronary Angiography, LV angiography  Indication: NSTEMI   Procedural Details: The left wrist was prepped, draped, and anesthetized with 1% lidocaine. Using the modified Seldinger technique, a 5/6 French Slender sheath was introduced into the left radial artery. 3 mg of verapamil was administered through the sheath, weight-based unfractionated heparin was administered intravenously. Standard Judkins catheters were used for selective coronary angiography and left ventriculography. Catheter exchanges were performed over an exchange length guidewire. There were no immediate procedural complications. A TR band was used for radial hemostasis at the completion of the procedure.  The patient was transferred to the post catheterization recovery area for further monitoring.  Procedural Findings: Hemodynamics: AO 107/66 LV 110/14  Coronary angiography: Coronary dominance: right  Left mainstem: The left main is patent with irregularity but no significant stenosis.  Left anterior descending (LAD): The LAD is totally occluded proximally. The mid and distal vessel filled from left to left collaterals. The diagonal branch also fills via collaterals.  Left circumflex (LCx): The left circumflex is large in caliber. The vessel is diffusely diseased. The proximal vessel has diffuse nonobstructive stenosis. The mid circumflex has an ulcerated plaque with associated 90% stenosis leading into a large second obtuse marginal branch.  Right coronary artery (RCA): The RCA is diffusely diseased. The mid vessel has diffuse 80% stenosis. Distal vessel is occluded. The acute marginal branch is patent. There is a collateral supply to the most apical portion of the LAD. There is also collateralization of the septal perforators of the LAD.  Left ventriculography: The anterolateral wall  is hypokinetic. The true apex has only mild hypokinesis. The other LV wall segments contract normally. The LVEF is estimated at 45%.  Estimated Blood Loss: Minimal  Final Conclusions:   1. Mild segmental LV systolic dysfunction with anterolateral akinesis but only mild reduction in LVEF 2. 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 I would favor cardiac surgery consultation for CABG. I think he has a good LAD target for grafting and suspect viability of most of the anterior wall. Will resume heparin post-cath.  Tonny BollmanMichael Daishaun Ayre MD, Hardtner Medical CenterFACC 02/08/2014, 4:17 PM

## 2014-02-09 DIAGNOSIS — I25110 Atherosclerotic heart disease of native coronary artery with unstable angina pectoris: Secondary | ICD-10-CM

## 2014-02-09 DIAGNOSIS — R072 Precordial pain: Secondary | ICD-10-CM

## 2014-02-09 DIAGNOSIS — Z01810 Encounter for preprocedural cardiovascular examination: Secondary | ICD-10-CM

## 2014-02-09 DIAGNOSIS — I480 Paroxysmal atrial fibrillation: Secondary | ICD-10-CM

## 2014-02-09 LAB — CBC
HCT: 41.9 % (ref 39.0–52.0)
Hemoglobin: 14.1 g/dL (ref 13.0–17.0)
MCH: 29.8 pg (ref 26.0–34.0)
MCHC: 33.7 g/dL (ref 30.0–36.0)
MCV: 88.6 fL (ref 78.0–100.0)
Platelets: 251 10*3/uL (ref 150–400)
RBC: 4.73 MIL/uL (ref 4.22–5.81)
RDW: 14.4 % (ref 11.5–15.5)
WBC: 10.4 10*3/uL (ref 4.0–10.5)

## 2014-02-09 LAB — HEPARIN LEVEL (UNFRACTIONATED)
Heparin Unfractionated: 0.1 IU/mL — ABNORMAL LOW (ref 0.30–0.70)
Heparin Unfractionated: 0.39 IU/mL (ref 0.30–0.70)
Heparin Unfractionated: 0.42 IU/mL (ref 0.30–0.70)

## 2014-02-09 LAB — PROTIME-INR
INR: 1.9 — ABNORMAL HIGH (ref 0.00–1.49)
Prothrombin Time: 22 seconds — ABNORMAL HIGH (ref 11.6–15.2)

## 2014-02-09 MED ORDER — AMIODARONE HCL 200 MG PO TABS
200.0000 mg | ORAL_TABLET | Freq: Two times a day (BID) | ORAL | Status: DC
  Administered 2014-02-09 – 2014-02-15 (×12): 200 mg via ORAL
  Filled 2014-02-09 (×16): qty 1

## 2014-02-09 MED ORDER — CARVEDILOL 6.25 MG PO TABS
6.2500 mg | ORAL_TABLET | Freq: Two times a day (BID) | ORAL | Status: DC
  Administered 2014-02-09 – 2014-02-10 (×4): 6.25 mg via ORAL
  Filled 2014-02-09 (×7): qty 1

## 2014-02-09 MED ORDER — ATORVASTATIN CALCIUM 40 MG PO TABS
40.0000 mg | ORAL_TABLET | Freq: Every day | ORAL | Status: DC
  Administered 2014-02-09 – 2014-03-04 (×23): 40 mg via ORAL
  Filled 2014-02-09 (×25): qty 1

## 2014-02-09 NOTE — Progress Notes (Addendum)
Pre-op Cardiac Surgery  Carotid Findings:  1-39% ICA stenosis.  Vertebral artery flow is antegrade.   Upper Extremity Right Left  Brachial Pressures 128 triphasic triphasic  Radial Waveforms triphasic triphasic  Ulnar Waveforms triphasic triphasic  Palmar Arch (Allen's Test) WNL WNL   Findings:  Doppler waveforms remain normal with ulnar and radial compressions bilaterally.    Lower  Extremity Right Left  Dorsalis Pedis    Anterior Tibial    Posterior Tibial    Ankle/Brachial Indices      Findings:  Palpable pedal pulses x 4.

## 2014-02-09 NOTE — Progress Notes (Signed)
CARDIAC REHAB PHASE I   PRE:  Rate/Rhythm: 83 SR  BP:  Supine:   Sitting: 148/77  Standing:    SaO2: 94 RA  MODE:  Ambulation: 350 ft   POST:  Rate/Rhythm: 85 SR  BP:  Supine:   Sitting 130/80:  Standing:    SaO2: 96 RA  Pt assisted to ambulate in the hallway x 1 assist with RW.  Pt complained of feeling weak in the legs which improved as the walk progressed.  Pt took 2 standing brief rest breaks.  Pt back to side of bed, family at bedside.  Reviewed with pt to increase activity with assistance, how to stand without using arms, deep breathing in anticipation of upcoming CABG.  Pt demonstrated appropriate.  Flow sheet given to pt and family for expectations. Showed pt and family the education video - preparing for heart surgery.  [facility] booklet provided. Alanson Alyarlette Carlton RN, BSN [phone]

## 2014-02-09 NOTE — Consult Note (Signed)
[address]       [place] [address]             [phone]        ALBERTUS CHIARELLI [facility] Medical Record #[id number] Date of Birth: 19-Jun-1940  Referring: Excell Seltzer Primary Care: [name], MD  Chief Complaint:   Chest pain, A. Fib  History of Present Illness:      Mr. Gregory Davila is a 74 yo white male who is fairly active.  He has a known history of CAD diagnosed originally in 2013 which was being treated with medical therapy.  He also has a history of Atrial Fibrillation for which he takes Coumadin.  He also has a history of CKD, HTN, PE, and smoking which he quit a long time ago.  The patient has been in his usual state of health.  However on 02/04/2014 the patient was out playing golf without difficulty.  He went to eat after the round was completed at which time he developed 9/10 burning sensation in his chest and epigastric discomfort.  He felt it was heart burn and later became nauseated and vomited once.  He states his pain decreased to 5/10 at that point.  He felt these were most likely symptoms having to due with his heart so he had his wife take him to [facility].  Upon arrival to the ED EKG showed the patient to be in Atrial Fibrillation with RVR.  He was treated with Cardizem and Lopressor with successful conversion to NSR.  Initial troponin was negative.  He was evaluated by a [facility] provider who recommended admission and further observation.  The patient requested transfer to [facility].    Upon transfer the patient was admitted by Cardiology.  The patient was chest pain free and it was felt his chest pain was likely due to PAF.   His cardiac enzymes remained negative during his hospitalization.  He again developed a dull ache in his chest and it was felt being his has a known history of CAD, cardiac catheterization would be indicated.  He was therefore taken off his Coumadin and placed on Heparin and treated with Vitamin K to bring his  INR down.  Catheterization was done yesterday and revealed severe 3 vessel CAD and it was felt CABG procedure would be the treatment of choice.  Currently the patient states he continues to have a dull ache across his chest region.  He is otherwise asymptomatic.  He also states he is having a gout flare in his left big toe, however medication is helping with this.  Current Activity/ Functional Status: Patient is independent with mobility/ambulation, transfers, ADL's, IADL's.   Zubrod Score: At the time of surgery this patient's most appropriate activity status/level should be described as: [x]     0    Normal activity, no symptoms []     1    Restricted in physical strenuous activity but ambulatory, able to do out light work []     2    Ambulatory and capable of self care, unable to do work activities, up and about                 more than 50%  Of the time                            []     3    Only limited self care, in  bed greater than 50% of waking hours []     4    Completely disabled, no self care, confined to bed or chair []     5    Moribund  Past Medical History  Diagnosis Date  . Paroxysmal atrial fibrillation     a. On Coumadin therapy/amidarone. b. 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  [name] CAD (coronary artery disease)     a, 2011: occluded LAD with left to right collaterals and moderate disease in the right coronary artery and circumflex, EF 50%. b. NSTEMI 07/2011 in setting of AF-RVR, MV abnl, cath w/ LAD 100%, RCA mod-severe dz, small vessel, med rx  . Hypertension     long-standing  . Chronic renal insufficiency     followed by primary care physician  . Pulmonary embolism   . Gout   . Anginal pain     Past Surgical History  Procedure Laterality Date  . Cardiac catheterization  2011 & 2013    2013 L main [place], LAD 100%, CFX 50%, RCA 80%, small vessel, med rx  . Nm myoview ltd  2013    Scar in the anteroseptal/periapical distribution with moderate  peri-infarct ischemia, EF 43%  . Left heart catheterization with coronary angiogram N/A 02/08/2014    Procedure: LEFT HEART CATHETERIZATION WITH CORONARY ANGIOGRAM;  Surgeon: Micheline Chapman, MD;  Location: [facility] CATH LAB;  Service: Cardiovascular;  Laterality: N/A;    History  Smoking status  . Former Smoker  . Types: Cigarettes  . Quit date: 01/04/1966  Smokeless tobacco  . Former Neurosurgeon  . Types: Chew  . Quit date: 01/05/2007    Comment: quit chewing tobacco about 4 yrs ago    History  Alcohol Use  . Yes    Comment: "seldomly drink beer" 1 every 2-3 weeks    History   Social History  . Marital Status: Married    Spouse Name: N/A    Number of Children: N/A  . Years of Education: N/A   Occupational History  . Retired    Social History Main Topics  . Smoking status: Former Smoker    Types: Cigarettes    Quit date: 01/04/1966  . Smokeless tobacco: Former Neurosurgeon    Types: Chew    Quit date: 01/05/2007     Comment: quit chewing tobacco about 4 yrs ago  . Alcohol Use: Yes     Comment: "seldomly drink beer" 1 every 2-3 weeks  . Drug Use: No  . Sexual Activity: Yes   Other Topics Concern  . Not on file   Social History Narrative    No Known Allergies  Current Facility-Administered Medications  Medication Dose Route Frequency Provider Last Rate Last Dose  . 0.9 %  sodium chloride infusion  250 mL Intravenous PRN Rhonda G Barrett, PA-C      . 0.9 %  sodium chloride infusion  250 mL Intravenous PRN Wendall Stade, MD      . 0.9 %  sodium chloride infusion   Intravenous Continuous Micheline Chapman, MD 10 mL/hr at 02/09/14 0700    . acetaminophen (TYLENOL) tablet 650 mg  650 mg Oral Q4H PRN Rhonda G Barrett, PA-C      . ALPRAZolam Prudy Feeler) tablet 0.25 mg  0.25 mg Oral BID PRN Rhonda G Barrett, PA-C      . antiseptic oral rinse (CPC / CETYLPYRIDINIUM CHLORIDE 0.05%) solution 7 mL  7 mL Mouth Rinse BID Thurmon Fair, MD   7  mL at 02/08/14 2213  . aspirin EC tablet 81 mg  81  mg Oral Daily Rhonda G Barrett, PA-C   81 mg at 02/07/14 1008  . Chlorhexidine Gluconate Cloth 2 % PADS 6 each  6 each Topical Q0600 Thurmon Fair, MD   6 each at 02/09/14 [id number]  . colchicine tablet 0.6 mg  0.6 mg Oral BID PRN Joline Salt Barrett, PA-C   0.6 mg at 02/08/14 2212  . gi cocktail (Maalox,Lidocaine,Donnatal)  30 mL Oral BID PRN Marrian Salvage, MD      . heparin ADULT infusion 100 units/mL (25000 units/250 mL)  1,550 Units/hr Intravenous Continuous Abran Duke, RPH 15.5 mL/hr at 02/09/14 0700 1,550 Units/hr at 02/09/14 0700  . isosorbide mononitrate (IMDUR) 24 hr tablet 30 mg  30 mg Oral Daily Rhonda G Barrett, PA-C   30 mg at 02/08/14 0920  . loratadine (CLARITIN) tablet 10 mg  10 mg Oral Daily Joline Salt Barrett, PA-C   10 mg at 02/08/14 0920  . metoprolol tartrate (LOPRESSOR) tablet 25 mg  25 mg Oral BID Joline Salt Barrett, PA-C   25 mg at 02/08/14 2212  . multivitamin with minerals tablet 1 tablet  1 tablet Oral Daily Joline Salt Barrett, PA-C   1 tablet at 02/08/14 0920  . mupirocin ointment (BACTROBAN) 2 % 1 application  1 application Nasal BID Thurmon Fair, MD   1 application at 02/08/14 2213  . nitroGLYCERIN (NITROSTAT) SL tablet 0.4 mg  0.4 mg Sublingual Q5 Min x 3 PRN Rhonda G Barrett, PA-C      . ondansetron (ZOFRAN) injection 4 mg  4 mg Intravenous Q6H PRN Rhonda G Barrett, PA-C      . pravastatin (PRAVACHOL) tablet 20 mg  20 mg Oral QHS Rhonda G Barrett, PA-C   20 mg at 02/08/14 2212  . ranolazine (RANEXA) 12 hr tablet 500 mg  500 mg Oral BID Wendall Stade, MD   500 mg at 02/08/14 2212  . sodium chloride 0.9 % injection 3 mL  3 mL Intravenous Q12H Rhonda G Barrett, PA-C   3 mL at 02/08/14 0921  . sodium chloride 0.9 % injection 3 mL  3 mL Intravenous PRN Rhonda G Barrett, PA-C      . sodium chloride 0.9 % injection 3 mL  3 mL Intravenous Q12H Wendall Stade, MD   3 mL at 02/08/14 2213  . sodium chloride 0.9 % injection 3 mL  3 mL Intravenous PRN Wendall Stade, MD      .  zolpidem (AMBIEN) tablet 5 mg  5 mg Oral QHS PRN Darrol Jump, PA-C        Prescriptions prior to admission  Medication Sig Dispense Refill Last Dose  . amiodarone (PACERONE) 200 MG tablet TAKE (1/2) TABLET BY MOUTH DAILY. 15 tablet 2 02/03/2014 at Unknown time  . aspirin 81 MG EC tablet Take 1 tablet (81 mg total) by mouth daily.   02/03/2014 at Unknown time  . COLCRYS 0.6 MG tablet Take 1 tablet by mouth 2 (two) times daily as needed. For gout   Past Week at Unknown time  . diltiazem (CARDIZEM CD) 300 MG 24 hr capsule TAKE 1 CAPSULE DAILY. 90 capsule 3 02/04/2014 at Unknown time  . isosorbide mononitrate (IMDUR) 30 MG 24 hr tablet TAKE 1 TABLET DAILY. 90 tablet 3 Past Week at Unknown time  . loratadine (CLARITIN) 10 MG tablet Take 10 mg by mouth daily.   Past Week at Unknown time  .  metoprolol tartrate (LOPRESSOR) 25 MG tablet TAKE (1/2) TABLET BY MOUTH TWICE DAILY. 90 tablet 3 02/04/2014 at 0800  . multivitamin (THERAGRAN) per tablet Take 1 tablet by mouth daily.     Past Week at Unknown time  . pravastatin (PRAVACHOL) 20 MG tablet Take 1 tablet (20 mg total) by mouth at bedtime. 30 tablet 6 Past Week at Unknown time  . warfarin (COUMADIN) 5 MG tablet Take 1 tablet (5 mg total) by mouth as directed. (Patient taking differently: Take 5 mg by mouth daily. ) 40 tablet 4 Past Week at Unknown time  . nitroGLYCERIN (NITROSTAT) 0.4 MG SL tablet Place 1 tablet (0.4 mg total) under the tongue every 5 (five) minutes x 3 doses as needed for chest pain. 25 tablet 2 unknown at unknown    Family History  Problem Relation Age of Onset  . Coronary artery disease Brother     MI     Review of Systems:  Constitutional: negative Respiratory: negative Cardiovascular: positive for chest pressure/discomfort and irregular heart beat Gastrointestinal: negative Neurological: negative     Cardiac Review of Systems: Y or N  Chest Pain [ y   ]  Resting SOB [ n  ] Exertional SOB  [n  ]  Orthopnea [  ]   Pedal  Edema [   ]    Palpitations [place][y  ] Syncope  [  ]   Presyncope [   ]  General Review of Systems: [Y] = yes [  ]=no Constitional: recent weight change [  ]; anorexia [  ]; fatigue [  ]; nausea [  ]; night sweats [  ]; fever [  ]; or chills [  ]                                                               Dental: poor dentition[  ]; Last Dentist visit:   Eye : blurred vision [  ]; diplopia [   ]; vision changes [  ];  Amaurosis fugax[  ]; Resp: cough [  ];  wheezing[n  ];  hemoptysis[  ]; shortness of breath[n  ]; paroxysmal nocturnal dyspnea[  ]; dyspnea on exertion[  ]; or orthopnea[  ];  GI:  gallstones[  ], vomiting[  ];  dysphagia[  ]; melena[  ];  hematochezia [  ]; heartburn[  ];   Hx of  Colonoscopy[  ]; GU: kidney stones [  ]; hematuria[  ];   dysuria [  ];  nocturia[  ];  history of     obstruction [  ]; urinary frequency [  ]             Skin: rash, swelling[ n];, hair loss[  ];  peripheral edema[n  ];  or itching[  ]; Musculosketetal: myalgias[  ];  joint swelling[  ];  joint erythema[  ];  joint pain[  ];  back pain[  ];  Heme/Lymph: bruising[  ];  bleeding[  ];  anemia[  ];  Neuro: TIA[  ];  headaches[  ];  stroke[  ];  vertigo[  ];  seizures[  ];   paresthesias[  ];  difficulty walking[  ];  Psych:depression[  ]; anxiety[  ];  Endocrine: diabetes[  ];  thyroid dysfunction[  ];  Immunizations: Flu [  ]; Pneumococcal[  ];  Other:  Physical Exam: BP 133/91 mmHg  Pulse 78  Temp(Src) 97.7 F (36.5 C) (Oral)  Resp 18  Ht 5\' 11"  (1.803 m)  Wt 222 lb 10.6 oz (101 kg)  BMI 31.07 kg/m2  SpO2 93%  General appearance: alert, cooperative and no distress Head: Normocephalic, without obvious abnormality, atraumatic Neck: no adenopathy, no carotid bruit, no JVD, supple, symmetrical, trachea midline and thyroid not enlarged, symmetric, no tenderness/mass/nodules Lymph nodes: Cervical, supraclavicular, and axillary nodes normal. Resp: clear to auscultation bilaterally Cardio: regular rate  and rhythm GI: soft, non-tender; bowel sounds normal; no masses,  no organomegaly Extremities: extremities normal, atraumatic, no cyanosis or edema Neurologic: Grossly normal  Diagnostic Studies & Laboratory data:  Hemodynamics: AO 107/66 LV 110/14  Coronary angiography: Coronary dominance: right  Left mainstem: The left main is patent with irregularity but no significant stenosis.  Left anterior descending (LAD): The LAD is totally occluded proximally. The mid and distal vessel filled from left to left collaterals. The diagonal branch also fills via collaterals.  Left circumflex (LCx): The left circumflex is large in caliber. The vessel is diffusely diseased. The proximal vessel has diffuse nonobstructive stenosis. The mid circumflex has an ulcerated plaque with associated 90% stenosis leading into a large second obtuse marginal branch.  Right coronary artery (RCA): The RCA is diffusely diseased. The mid vessel has diffuse 80% stenosis. Distal vessel is occluded. The acute marginal branch is patent. There is a collateral supply to the most apical portion of the LAD. There is also collateralization of the septal perforators of the LAD.  Left ventriculography: The anterolateral wall is hypokinetic. The true apex has only mild hypokinesis. The other LV wall segments contract normally. The LVEF is estimated at 45%.      I have independently reviewed the above radiologic studies.  Recent Lab Findings: Lab Results  Component Value Date   WBC 10.4 02/09/2014   HGB 14.1 02/09/2014   HCT 41.9 02/09/2014   PLT 251 02/09/2014   GLUCOSE 100* 02/08/2014   CHOL 182 07/13/2011   TRIG 198* 07/13/2011   HDL 38* 07/13/2011   LDLCALC 104* 07/13/2011   ALT 59* 02/05/2014   AST 72* 02/05/2014   NA 136 02/08/2014   K 4.8 02/08/2014   CL 103 02/08/2014   CREATININE 1.76* 02/08/2014   BUN 21 02/08/2014   CO2 29 02/08/2014   TSH 2.884 02/04/2014   INR 1.90* 02/09/2014      Assessment /  Plan:      1. CAD- 3 Vessel CAD, will need CABG 2. PAF- currently NSR, coumadin on Hold, INR down to 1.9, continue Heparin 3. CKD- creatinine at 1.71, baseline appears to be around 1.6 4. Gout- on colchicine 5. Dispo- patient stable, 3V CAD needs CABG, INR remains elevated at 1.9 will need to be closer to 1 prior to proceeding with surgery, maintaining NSR.  Dr. Donata Clay is aware of patient and will come speak further with patient.  Patient examined, cardiac catheterization and 2-D echocardiogram as well as chest x-ray personally reviewed. Patient presents with unstable angina and severe three-vessel coronary artery disease. He was found to be in new onset atrial fibrillation which was converted to sinus rhythm with medication. 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  On this admission his baseline creatinine is elevated at 1.6. He does not  have diabetes. He has positive nasal swab for MRSA on Bactroban The patient is stable on IV heparin and his INR is decreasing back towards normal. The patient needs multivessel CABG which will be scheduled for the first available date on the operative schedule. I discussed the plan for CABG with the patient including the expected benefits alternatives and risks and he agrees to proceed. The patient understands he will need to remain hospitalized until his surgery.  @ 02/09/2014 8:47 AM

## 2014-02-09 NOTE — Progress Notes (Signed)
ANTICOAGULATION CONSULT NOTE - Follow Up Consult  Pharmacy Consult for Heparin  Indication: atrial fibrillation, 3v CAD awaiting CVTS consult  No Known Allergies  Patient Measurements: Height: 5\' 11"  (180.3 cm) Weight: 224 lb 3.3 oz (101.7 kg) IBW/kg (Calculated) : 75.3  Vital Signs: Temp: 98 F (36.7 C) (02/05 2337) Temp Source: Oral (02/05 2337) BP: 116/77 mmHg (02/05 2337) Pulse Rate: 76 (02/05 2337)  Labs:  Recent Labs  02/06/14 [id number]  02/07/14 0850  02/08/14 0250 02/08/14 1203 02/09/14 0137  HGB  --   --   --   --  14.5  --  14.1  HCT  --   --   --   --  43.4  --  41.9  PLT  --   --   --   --  256  --  251  LABPROT  --   < >  --   < > 25.3* 22.5* 22.0*  INR  --   < >  --   < > 2.27* 1.96* 1.90*  HEPARINUNFRC  --   --   --   --   --   --  0.10*  CREATININE 1.82*  --   --   --   --  1.76*  --   TROPONINI  --   --  0.05*  --   --   --   --   < > = values in this interval not displayed.  Estimated Creatinine Clearance: 45.4 mL/min (by C-G formula based on Cr of 1.76).   Assessment: Sub-therapeutic heparin level, no issues per RN.   Goal of Therapy:  Heparin level 0.3-0.7 units/ml Monitor platelets by anticoagulation protocol: Yes   Plan:  -Increase heparin drip to 1550 units/hr -1130 HL -Daily CBC/HL -Monitor for bleeding  Abran DukeLedford, Diany Formosa 02/09/2014,3:07 AM

## 2014-02-09 NOTE — Progress Notes (Signed)
  Echocardiogram 2D Echocardiogram has been performed.  Gregory Davila, Gregory Davila A 02/09/2014, 10:48 AM

## 2014-02-09 NOTE — Progress Notes (Addendum)
ANTICOAGULATION CONSULT NOTE - Follow Up Consult  Pharmacy Consult for Heparin  Indication: atrial fibrillation  No Known Allergies  Patient Measurements: Height: 5\' 11"  (180.3 cm) Weight: 222 lb 10.6 oz (101 kg) IBW/kg (Calculated) : 75.3  Vital Signs: Temp: 97.9 F (36.6 C) (02/06 1211) Temp Source: Oral (02/06 1211) BP: 125/91 mmHg (02/06 1211) Pulse Rate: 80 (02/06 1211)  Labs:  Recent Labs  02/07/14 0850  02/08/14 0250 02/08/14 1203 02/09/14 0137 02/09/14 1115  HGB  --   --  14.5  --  14.1  --   HCT  --   --  43.4  --  41.9  --   PLT  --   --  256  --  251  --   LABPROT  --   < > 25.3* 22.5* 22.0*  --   INR  --   < > 2.27* 1.96* 1.90*  --   HEPARINUNFRC  --   --   --   --  0.10* 0.39  CREATININE  --   --   --  1.76*  --   --   TROPONINI 0.05*  --   --   --   --   --   < > = values in this interval not displayed.  Estimated Creatinine Clearance: 45.3 mL/min (by C-G formula based on Cr of 1.76).   Assessment: 74yo male with Hx CAD and Afib, PE - on warfarin pta, admitted to OSH with chest tightness. Found to be in Afib with RVR but now in NSR. Holding warfarin for CABG planned for 02/14/2014.  CBC stable, no bleeding noted.  Heparin level therapeutic today x1 at 0.39  Goal of Therapy:  Heparin level 0.3-0.7 units/ml Monitor platelets by anticoagulation protocol: Yes   Plan:  -Continue heparin drip at 1550 units/hr -1930 HL -Daily CBC and heparin level -Monitor for bleeding  Waynette Butteryegan K. Magsam, PharmD Clinical Pharmacy Resident Pager: [phone] 02/09/2014 1:05 PM  Addum:  F/u heparin level therpaeutic.  Cont heparin at 1550 units/hr. Talbert CageLora Algenis Ballin, PharmD

## 2014-02-09 NOTE — Progress Notes (Addendum)
Patient ID: Gregory Davila, male   DOB: [birth year], 74 y.o.   MRN: [id number]   SUBJECTIVE: No chest pain this morning, feels ok overall.  He remains in NSR.   Scheduled Meds: . antiseptic oral rinse  7 mL Mouth Rinse BID  . aspirin  81 mg Oral Daily  . Chlorhexidine Gluconate Cloth  6 each Topical Q0600  . isosorbide mononitrate  30 mg Oral Daily  . loratadine  10 mg Oral Daily  . metoprolol tartrate  25 mg Oral BID  . multivitamin with minerals  1 tablet Oral Daily  . mupirocin ointment  1 application Nasal BID  . pravastatin  20 mg Oral QHS  . ranolazine  500 mg Oral BID  . sodium chloride  3 mL Intravenous Q12H  . sodium chloride  3 mL Intravenous Q12H   Continuous Infusions: . sodium chloride 10 mL/hr at 02/09/14 0700  . heparin 1,550 Units/hr (02/09/14 0700)   PRN Meds:.sodium chloride, sodium chloride, acetaminophen, ALPRAZolam, colchicine, gi cocktail, nitroGLYCERIN, ondansetron (ZOFRAN) IV, sodium chloride, sodium chloride, zolpidem    Filed Vitals:   02/09/14 0410 02/09/14 0700 02/09/14 0800 02/09/14 1000  BP: 137/73 133/91  134/85  Pulse: 81 78 79 78  Temp: 98 F (36.7 C) 97.7 F (36.5 C)    TempSrc: Oral Oral    Resp: Height:      Weight: 222 lb 10.6 oz (101 kg)     SpO2: 91% 93% 91% 91%    Intake/Output Summary (Last 24 hours) at 02/09/14 1017 Last data filed at 02/09/14 1000  Gross per 24 hour  Intake 1658.35 ml  Output   1121 ml  Net 537.35 ml    LABS: Basic Metabolic Panel:  Recent Labs  40/98/11 1203  NA 136  K 4.8  CL 103  CO2 29  GLUCOSE 100*  BUN 21  CREATININE 1.76*  CALCIUM 9.4   Liver Function Tests: No results for input(s): AST, ALT, ALKPHOS, BILITOT, PROT, ALBUMIN in the last 72 hours. No results for input(s): LIPASE, AMYLASE in the last 72 hours. CBC:  Recent Labs  02/08/14 0250 02/09/14 0137  WBC 13.2* 10.4  HGB 14.5 14.1  HCT 43.4 41.9  MCV 88.0 88.6  PLT 256 251   Cardiac Enzymes:  Recent Labs  02/07/14 0850  TROPONINI 0.05*   BNP: Invalid input(s): POCBNP D-Dimer: No results for input(s): DDIMER in the last 72 hours. Hemoglobin A1C: No results for input(s): HGBA1C in the last 72 hours. Fasting Lipid Panel: No results for input(s): CHOL, HDL, LDLCALC, TRIG, CHOLHDL, LDLDIRECT in the last 72 hours. Thyroid Function Tests: No results for input(s): TSH, T4TOTAL, T3FREE, THYROIDAB in the last 72 hours.  Invalid input(s): FREET3 Anemia Panel: No results for input(s): VITAMINB12, FOLATE, FERRITIN, TIBC, IRON, RETICCTPCT in the last 72 hours.  RADIOLOGY: Dg Chest 2 View  02/06/2014   CLINICAL DATA:  Shortness of breath. Atrial fibrillation. Initial encounter.  EXAM: CHEST  2 VIEW  COMPARISON:  02/03/2014 radiographs.  FINDINGS: 0922 hr. The heart size and mediastinal contours are stable. There is stable linear bibasilar atelectasis or scarring. No edema, confluent airspace opacity or significant pleural effusion demonstrated. The bones appear unchanged.  IMPRESSION: Stable linear bibasilar atelectasis or scarring. No evidence of edema or acute process.   Electronically Signed   By: Roxy Horseman M.D.   On: 02/06/2014 10:10    PHYSICAL EXAM General: NAD Neck: No JVD, no thyromegaly or thyroid nodule.  Lungs:  Clear to auscultation bilaterally with normal respiratory effort. CV: Nondisplaced PMI.  Heart regular S1/S2, no S3/S4, no murmur.  No peripheral edema.  No carotid bruit.  Normal pedal pulses.  Abdomen: Soft, nontender, no hepatosplenomegaly, no distention.  Neurologic: Alert and oriented x 3.  Psych: Normal affect. Extremities: No clubbing or cyanosis.   TELEMETRY: Reviewed telemetry pt in NSR  ASSESSMENT AND PLAN: 74 yo with history of paroxysmal atrial fibrillation and known CAD (occluded LAD) [facility]  Mild increase in TnI.  1. CAD: LHC yesterday showed known occluded LAD with collaterals but also had severe  disease nondominant LCx and small RCA.  Plan for CABG.  - Continue beta blocker, change to Coreg (EF appears down on echo - being done right now).  - Hold ACEI for now with elevated creatinine.  - ASA, change statin to atorvastatin 40.  - He is on Ranexa for angina, will also decrease risk of recurrent atrial fibrillation.  - Echo: getting today.  2. Atrial fibrillation: Paroxysmal.  He is on heparin gtt currently, off coumadin prior to CABG.  NSR today.  Continue beta blocker, will add amiodarone to decrease risk for post-CABG afib/RVR.  3. CKD: Needs BMET in am.   Gregory Davila 02/09/2014 10:26 AM

## 2014-02-10 ENCOUNTER — Inpatient Hospital Stay (HOSPITAL_COMMUNITY): Payer: Medicare Other

## 2014-02-10 DIAGNOSIS — Z86711 Personal history of pulmonary embolism: Secondary | ICD-10-CM

## 2014-02-10 DIAGNOSIS — N183 Chronic kidney disease, stage 3 (moderate): Secondary | ICD-10-CM

## 2014-02-10 DIAGNOSIS — I214 Non-ST elevation (NSTEMI) myocardial infarction: Principal | ICD-10-CM

## 2014-02-10 LAB — BASIC METABOLIC PANEL
Anion gap: 7 (ref 5–15)
BUN: 24 mg/dL — ABNORMAL HIGH (ref 6–23)
CO2: 25 mmol/L (ref 19–32)
Calcium: 8.7 mg/dL (ref 8.4–10.5)
Chloride: 105 mmol/L (ref 96–112)
Creatinine, Ser: 1.75 mg/dL — ABNORMAL HIGH (ref 0.50–1.35)
GFR calc Af Amer: 43 mL/min — ABNORMAL LOW (ref >90)
GFR calc non Af Amer: 37 mL/min — ABNORMAL LOW (ref >90)
Glucose, Bld: 105 mg/dL — ABNORMAL HIGH (ref 70–99)
Potassium: 3.8 mmol/L (ref 3.5–5.1)
Sodium: 137 mmol/L (ref 135–145)

## 2014-02-10 LAB — CBC
HCT: 40.8 % (ref 39.0–52.0)
Hemoglobin: 13.6 g/dL (ref 13.0–17.0)
MCH: 29.7 pg (ref 26.0–34.0)
MCHC: 33.3 g/dL (ref 30.0–36.0)
MCV: 89.1 fL (ref 78.0–100.0)
Platelets: 243 10*3/uL (ref 150–400)
RBC: 4.58 MIL/uL (ref 4.22–5.81)
RDW: 14.5 % (ref 11.5–15.5)
WBC: 8 10*3/uL (ref 4.0–10.5)

## 2014-02-10 LAB — HEPARIN LEVEL (UNFRACTIONATED): Heparin Unfractionated: 0.52 IU/mL (ref 0.30–0.70)

## 2014-02-10 LAB — PROTIME-INR
INR: 1.48 (ref 0.00–1.49)
Prothrombin Time: 18 seconds — ABNORMAL HIGH (ref 11.6–15.2)

## 2014-02-10 MED ORDER — ADULT MULTIVITAMIN W/MINERALS CH
1.0000 | ORAL_TABLET | Freq: Every day | ORAL | Status: DC
  Administered 2014-02-11 – 2014-02-13 (×3): 1 via ORAL
  Filled 2014-02-10 (×4): qty 1

## 2014-02-10 MED ORDER — NITROGLYCERIN IN D5W 200-5 MCG/ML-% IV SOLN
0.0000 ug/min | INTRAVENOUS | Status: DC
  Administered 2014-02-10: 10 ug/min via INTRAVENOUS
  Administered 2014-02-11 – 2014-02-13 (×2): 20 ug/min via INTRAVENOUS
  Administered 2014-02-14: 08:00:00 via INTRAVENOUS
  Filled 2014-02-10 (×3): qty 250

## 2014-02-10 NOTE — Progress Notes (Signed)
I/S instructions given to patient and patient demonstrated correct procedure. Patient able to achieved over on inspiration. Advised patient to use I/S at least ten times every hour he is awake. Patient stated he understood and would be working on it. Will continue to monitor.

## 2014-02-10 NOTE — Progress Notes (Signed)
Patient c/o chest pressure and pain at 0640. B/P 200/104, Nitro SL given x 1 with relief. B/P down to 128/83. Will continue to monitor closely.

## 2014-02-10 NOTE — Progress Notes (Addendum)
Patient ID: Gregory Davila, male   DOB: 08/23/1940, 74 y.o.   MRN: [id number]   SUBJECTIVE: 2 episodes of chest pain last night, both times when he got up out of bed to use bathroom.  BP at times very high.   Scheduled Meds: . amiodarone  200 mg Oral BID  . antiseptic oral rinse  7 mL Mouth Rinse BID  . aspirin  81 mg Oral Daily  . atorvastatin  40 mg Oral q1800  . carvedilol  6.25 mg Oral BID WC  . isosorbide mononitrate  30 mg Oral Daily  . loratadine  10 mg Oral Daily  . [START ON 02/11/2014] multivitamin with minerals  1 tablet Oral Daily  . ranolazine  500 mg Oral BID  . sodium chloride  3 mL Intravenous Q12H  . sodium chloride  3 mL Intravenous Q12H   Continuous Infusions: . sodium chloride 75 mL/hr at 02/10/14 0821  . heparin 1,550 Units/hr (02/10/14 0700)   PRN Meds:.sodium chloride, sodium chloride, acetaminophen, ALPRAZolam, colchicine, gi cocktail, nitroGLYCERIN, ondansetron (ZOFRAN) IV, sodium chloride, sodium chloride, zolpidem    Filed Vitals:   02/10/14 0000 02/10/14 0300 02/10/14 0400 02/10/14 0806  BP:  116/87 139/74 149/94  Pulse: 69 66 75 78  Temp:  97.3 F (36.3 C)  98.3 F (36.8 C)  TempSrc:  Oral  Oral  Resp: 15 17    Height:      Weight:  224 lb 13.9 oz (102 kg)    SpO2: 90% 90% 95% 96%    Intake/Output Summary (Last 24 hours) at 02/10/14 1033 Last data filed at 02/10/14 0900  Gross per 24 hour  Intake 2016.5 ml  Output   1325 ml  Net  691.5 ml    LABS: Basic Metabolic Panel:  Recent Labs  08/65/78 1203 02/10/14 0359  NA 136 137  K 4.8 3.8  CL 103 105  CO2 29 25  GLUCOSE 100* 105*  BUN 21 24*  CREATININE 1.76* 1.75*  CALCIUM 9.4 8.7   Liver Function Tests: No results for input(s): AST, ALT, ALKPHOS, BILITOT, PROT, ALBUMIN in the last 72 hours. No results for input(s): LIPASE, AMYLASE in the last 72 hours. CBC:  Recent Labs  02/09/14 0137 02/10/14 0359  WBC 10.4 8.0  HGB 14.1 13.6  HCT 41.9 40.8  MCV 88.6 89.1  PLT 251 243    Cardiac Enzymes: No results for input(s): CKTOTAL, CKMB, CKMBINDEX, TROPONINI in the last 72 hours. BNP: Invalid input(s): POCBNP D-Dimer: No results for input(s): DDIMER in the last 72 hours. Hemoglobin A1C: No results for input(s): HGBA1C in the last 72 hours. Fasting Lipid Panel: No results for input(s): CHOL, HDL, LDLCALC, TRIG, CHOLHDL, LDLDIRECT in the last 72 hours. Thyroid Function Tests: No results for input(s): TSH, T4TOTAL, T3FREE, THYROIDAB in the last 72 hours.  Invalid input(s): FREET3 Anemia Panel: No results for input(s): VITAMINB12, FOLATE, FERRITIN, TIBC, IRON, RETICCTPCT in the last 72 hours.  RADIOLOGY: Dg Chest 2 View  02/06/2014   CLINICAL DATA:  Shortness of breath. Atrial fibrillation. Initial encounter.  EXAM: CHEST  2 VIEW  COMPARISON:  02/03/2014 radiographs.  FINDINGS: 0922 hr. The heart size and mediastinal contours are stable. There is stable linear bibasilar atelectasis or scarring. No edema, confluent airspace opacity or significant pleural effusion demonstrated. The bones appear unchanged.  IMPRESSION: Stable linear bibasilar atelectasis or scarring. No evidence of edema or acute process.   Electronically Signed   By: Roxy Horseman M.D.   On: 02/06/2014  10:10    PHYSICAL EXAM General: NAD Neck: No JVD, no thyromegaly or thyroid nodule.  Lungs: Clear to auscultation bilaterally with normal respiratory effort. CV: Nondisplaced PMI.  Heart regular S1/S2, no S3/S4, no murmur.  No peripheral edema.  No carotid bruit.  Normal pedal pulses.  Abdomen: Soft, nontender, no hepatosplenomegaly, no distention.  Neurologic: Alert and oriented x 3.  Psych: Normal affect. Extremities: No clubbing or cyanosis.   TELEMETRY: Reviewed telemetry pt in NSR  ASSESSMENT AND PLAN: 74 yo with history of paroxysmal atrial fibrillation and known CAD (occluded LAD) [facility]  Mild increase in TnI.  1. CAD: LHC 2/5  showed known occluded LAD with collaterals but also had severe disease nondominant LCx and small RCA.  Plan for CABG.  - Continue Coreg.  - Given ongoing CP with mild exertion and HTN, will stop Imdur and use NTG gtt.   - Hold ACEI for now with elevated creatinine.  - ASA, atorvastatin.   - He is on Ranexa for angina, will also decrease risk of recurrent atrial fibrillation.  - Seen by cardiac surgery PA yesterday, will be seen by surgeon today for timing of CABG.  Hopefully soon given ongoing anginal episodes.  2. Atrial fibrillation: Paroxysmal.  He is on heparin gtt currently, off coumadin prior to CABG.  NSR today.  Continue beta blocker, I added amiodarone to decrease risk for post-CABG afib/RVR.  3. CKD: Stable creatinine.  Avoid ACEI. 4. Ischemic cardiomyopathy: EF 40-45% on echo.  He is on Coreg, holding off on ACEI with CKD.   5. HTN: Adding NTG gtt.   Gregory Davila 02/10/2014 10:33 AM

## 2014-02-10 NOTE — Progress Notes (Addendum)
Patient c/o chest pain med chest after getting out of bed to void. Nitro gtt increased to 6620mcg/min. B/P 184/99. HR 84. Will monitor closely.

## 2014-02-10 NOTE — Progress Notes (Signed)
Patient c/o chest pressure mid chest. B/P 201/103. HR 84. NSR,  patient given 1 SL Nitro and all pain was relieved. Patient continues to have PVC's.  B/P down to 116/87. Will monitor closely.

## 2014-02-10 NOTE — Progress Notes (Signed)
ANTICOAGULATION CONSULT NOTE - Follow Up Consult  Pharmacy Consult for Heparin  Indication: atrial fibrillation  No Known Allergies  Patient Measurements: Height: 5\' 11"  (180.3 cm) Weight: 224 lb 13.9 oz (102 kg) IBW/kg (Calculated) : 75.3  Vital Signs: Temp: 98.3 F (36.8 C) (02/07 0806) Temp Source: Oral (02/07 0806) BP: 149/94 mmHg (02/07 0806) Pulse Rate: 78 (02/07 0806)  Labs:  Recent Labs  02/08/14 0250 02/08/14 1203  02/09/14 0137 02/09/14 1115 02/09/14 1905 02/10/14 0357 02/10/14 0359  HGB 14.5  --   --  14.1  --   --   --  13.6  HCT 43.4  --   --  41.9  --   --   --  40.8  PLT 256  --   --  251  --   --   --  243  LABPROT 25.3* 22.5*  --  22.0*  --   --   --  18.0*  INR 2.27* 1.96*  --  1.90*  --   --   --  1.48  HEPARINUNFRC  --   --   < > 0.10* 0.39 0.42 0.52  --   CREATININE  --  1.76*  --   --   --   --   --  1.75*  < > = values in this interval not displayed.  Estimated Creatinine Clearance: 45.7 mL/min (by C-G formula based on Cr of 1.75).   Assessment: 74yo male with Hx CAD and Afib, PE - on warfarin pta, admitted to OSH with chest tightness. Found to be in Afib with RVR but now in NSR. Holding warfarin for CABG planned for 02/14/2014.  CBC stable, no bleeding noted.  Pt continues to experience episodes of chest pain.  DC Imdur and switch to NTG gtt today.  Heparin level remains therapeutic today at 0.52  Goal of Therapy:  Heparin level 0.3-0.7 units/ml Monitor platelets by anticoagulation protocol: Yes   Plan:  -Continue heparin drip at 1550 units/hr -Daily CBC and heparin level -Monitor for bleeding  Waynette Butteryegan K. Zita Ozimek, PharmD Clinical Pharmacy Resident Pager: [phone] 02/10/2014 10:49 AM

## 2014-02-10 NOTE — Progress Notes (Signed)
VASCULAR LAB PRELIMINARY  PRELIMINARY  PRELIMINARY  PRELIMINARY  Bilateral lower extremity venous Dopplers completed.    Preliminary report:  There is no DVT or SVT noted in the bilateral lower extremities.   Mame Twombly, RVT 02/10/2014, 6:42 PM

## 2014-02-11 ENCOUNTER — Inpatient Hospital Stay (HOSPITAL_COMMUNITY): Payer: Medicare Other

## 2014-02-11 ENCOUNTER — Other Ambulatory Visit (HOSPITAL_COMMUNITY): Payer: Self-pay | Admitting: Radiology

## 2014-02-11 LAB — PULMONARY FUNCTION TEST
FEF 25-75 Post: 1.75 L/sec
FEF 25-75 Pre: 1.93 L/sec
FEF2575-%Change-Post: -9 %
FEF2575-%Pred-Post: 72 %
FEF2575-%Pred-Pre: 79 %
FEV1-%Change-Post: -4 %
FEV1-%Pred-Post: 80 %
FEV1-%Pred-Pre: 84 %
FEV1-Post: 2.63 L
FEV1-Pre: 2.76 L
FEV1FVC-%Change-Post: -5 %
FEV1FVC-%Pred-Pre: 96 %
FEV6-%Change-Post: 1 %
FEV6-%Pred-Post: 90 %
FEV6-%Pred-Pre: 89 %
FEV6-Post: 3.82 L
FEV6-Pre: 3.77 L
FEV6FVC-%Change-Post: 0 %
FEV6FVC-%Pred-Post: 102 %
FEV6FVC-%Pred-Pre: 101 %
FVC-%Change-Post: 1 %
FVC-%Pred-Post: 88 %
FVC-%Pred-Pre: 87 %
FVC-Post: 3.98 L
FVC-Pre: 3.93 L
Post FEV1/FVC ratio: 66 %
Post FEV6/FVC ratio: 96 %
Pre FEV1/FVC ratio: 70 %
Pre FEV6/FVC Ratio: 96 %

## 2014-02-11 LAB — CBC
HCT: 39.6 % (ref 39.0–52.0)
Hemoglobin: 12.8 g/dL — ABNORMAL LOW (ref 13.0–17.0)
MCH: 29.4 pg (ref 26.0–34.0)
MCHC: 32.3 g/dL (ref 30.0–36.0)
MCV: 90.8 fL (ref 78.0–100.0)
Platelets: 250 10*3/uL (ref 150–400)
RBC: 4.36 MIL/uL (ref 4.22–5.81)
RDW: 14.4 % (ref 11.5–15.5)
WBC: 7.2 10*3/uL (ref 4.0–10.5)

## 2014-02-11 LAB — PROTIME-INR
INR: 1.25 (ref 0.00–1.49)
Prothrombin Time: 15.8 seconds — ABNORMAL HIGH (ref 11.6–15.2)

## 2014-02-11 LAB — HEPARIN LEVEL (UNFRACTIONATED): Heparin Unfractionated: 0.61 IU/mL (ref 0.30–0.70)

## 2014-02-11 MED ORDER — ALBUTEROL SULFATE (2.5 MG/3ML) 0.083% IN NEBU
2.5000 mg | INHALATION_SOLUTION | Freq: Once | RESPIRATORY_TRACT | Status: AC
  Administered 2014-02-11: 2.5 mg via RESPIRATORY_TRACT

## 2014-02-11 MED ORDER — CARVEDILOL 12.5 MG PO TABS
12.5000 mg | ORAL_TABLET | Freq: Two times a day (BID) | ORAL | Status: DC
  Administered 2014-02-11 – 2014-02-13 (×6): 12.5 mg via ORAL
  Filled 2014-02-11 (×9): qty 1

## 2014-02-11 NOTE — Progress Notes (Signed)
    Subjective:  Had chest pain last night when up to bathroom. NTG increased and pain resolved. Feels well this am. Has eaten breakfast.   Objective:  Vital Signs in the last 24 hours: Temp:  [97.1 F (36.2 C)-98.2 F (36.8 C)] 98.2 F (36.8 C) (02/08 0436) Pulse Rate:  [67-83] 67 (02/08 0700) Resp:  [18] 18 (02/08 0000) BP: (116-156)/(70-85) 156/85 mmHg (02/08 0700) SpO2:  [92 %-96 %] 95 % (02/08 0700) Weight:  [228 lb 9.9 oz (103.7 kg)] 228 lb 9.9 oz (103.7 kg) (02/08 0436)  Intake/Output from previous day: 02/07 0701 - 02/08 0700 In: 2622.5 [P.O.:480; I.V.:2142.5] Out: 1900 [Urine:1900]  Physical Exam: Pt is alert and oriented, NAD HEENT: normal Neck: JVP - normal Lungs: CTA bilaterally CV: RRR without murmur or gallop Abd: soft, NT, Positive BS, no hepatomegaly Ext: no C/C/E, distal pulses intact and equal Skin: warm/dry no rash   Lab Results:  Recent Labs  02/10/14 0359 02/11/14 0340  WBC 8.0 7.2  HGB 13.6 12.8*  PLT 243 250    Recent Labs  02/08/14 1203 02/10/14 0359  NA 136 137  K 4.8 3.8  CL 103 105  CO2 29 25  GLUCOSE 100* 105*  BUN 21 24*  CREATININE 1.76* 1.75*   No results for input(s): TROPONINI in the last 72 hours.  Invalid input(s): CK, MB  Cardiac Studies: 2D Echo: Study Conclusions  - Left ventricle: The cavity size was normal. Wall thickness was increased in a pattern of moderate LVH. Systolic function was mildly to moderately reduced. The estimated ejection fraction was in the range of 40% to 45%. Septal hypokinesis. Doppler parameters are consistent with abnormal left ventricular relaxation (grade 1 diastolic dysfunction). - Aortic valve: There was no stenosis. There was mild regurgitation. - Mitral valve: Mildly calcified annulus. Mildly calcified leaflets . There was mild regurgitation. - Right ventricle: The cavity size was normal. Systolic function was normal. - Pulmonary arteries: No complete TR  doppler jet so unable to estimate PA systolic pressure. - Systemic veins: IVC measured 2.5 cm with no significant respirophasic variation, estimate RA pressure 15 mmHg.  Impressions:  - Normal LV size with moderate LV hypertrophy. EF 40-45% with septal hypokinesis. Normal RV size and systolic function. Mild AI, mild MR.  Tele: Sinus rhythm, heart rate 70's  Assessment/Plan:  1. Unstable angina: multivessel CAD. Plans for CABG this Thursday per Dr Donata ClayVan Trigt. Continue IV heparin and NTG. Increase carvedilol to 12.5 mg BID. Continue other Rx.   2. PAF - agree with preop amiodarone to reduce risk of post-CABG AF. On heparin.  3. HTN - increase carvedilol. Continue other therapies.   4. Hyperlipidemia - on atorvastatin. Will add lipid panel to am labs tomorrow.  5. CKD, stage 3. Avoid ACE to reduce risk of acute kidney injury  Tonny BollmanMichael Lakashia Collison, M.D. 02/11/2014, 8:47 AM

## 2014-02-11 NOTE — Progress Notes (Signed)
ANTICOAGULATION CONSULT NOTE - Follow Up Consult  Pharmacy Consult for Heparin  Indication: atrial fibrillation  No Known Allergies  Patient Measurements: Height: 5\' 11"  (180.3 cm) Weight: 228 lb 9.9 oz (103.7 kg) IBW/kg (Calculated) : 75.3  Vital Signs: Temp: 97.6 F (36.4 C) (02/08 0800) Temp Source: Oral (02/08 0800) BP: 140/84 mmHg (02/08 0900) Pulse Rate: 77 (02/08 0900)  Labs:  Recent Labs  02/08/14 1203  02/09/14 0137  02/09/14 1905 02/10/14 0357 02/10/14 0359 02/11/14 0340  HGB  --   < > 14.1  --   --   --  13.6 12.8*  HCT  --   --  41.9  --   --   --  40.8 39.6  PLT  --   --  251  --   --   --  243 250  LABPROT 22.5*  --  22.0*  --   --   --  18.0* 15.8*  INR 1.96*  --  1.90*  --   --   --  1.48 1.25  HEPARINUNFRC  --   --  0.10*  < > 0.42 0.52  --  0.61  CREATININE 1.76*  --   --   --   --   --  1.75*  --   < > = values in this interval not displayed.  Estimated Creatinine Clearance: 46.1 mL/min (by C-G formula based on Cr of 1.75).   Assessment: 74yo male with Hx CAD and Afib, PE - on warfarin pta, admitted with chest tightness. Found to be in Afib with RVR but now in NSR. He is on heparin and holding warfarin for CABG planned for 02/14/2014.  Heparin level remains therapeutic today at 0.61  Goal of Therapy:  Heparin level 0.3-0.7 units/ml Monitor platelets by anticoagulation protocol: Yes   Plan:  -Continue heparin drip at 1550 units/hr -Daily CBC and heparin level  Harland GermanAndrew Lyndzee Kliebert, Pharm D 02/11/2014 11:58 AM

## 2014-02-12 LAB — PROTIME-INR
INR: 1.2 (ref 0.00–1.49)
Prothrombin Time: 15.4 seconds — ABNORMAL HIGH (ref 11.6–15.2)

## 2014-02-12 LAB — CBC
HCT: 38.3 % — ABNORMAL LOW (ref 39.0–52.0)
Hemoglobin: 12.6 g/dL — ABNORMAL LOW (ref 13.0–17.0)
MCH: 29.3 pg (ref 26.0–34.0)
MCHC: 32.9 g/dL (ref 30.0–36.0)
MCV: 89.1 fL (ref 78.0–100.0)
Platelets: 248 10*3/uL (ref 150–400)
RBC: 4.3 MIL/uL (ref 4.22–5.81)
RDW: 14.4 % (ref 11.5–15.5)
WBC: 8.3 10*3/uL (ref 4.0–10.5)

## 2014-02-12 LAB — HEPARIN LEVEL (UNFRACTIONATED): Heparin Unfractionated: 0.39 IU/mL (ref 0.30–0.70)

## 2014-02-12 NOTE — Progress Notes (Signed)
    Subjective:  Doing well. No chest pain overnight. No dyspnea. Headache from NTG has resolved.  Objective:  Vital Signs in the last 24 hours: Temp:  [96.7 F (35.9 C)-98.5 F (36.9 C)] 97.7 F (36.5 C) (02/09 0743) Pulse Rate:  [71-81] 71 (02/09 0743) Resp:  [20-26] 20 (02/09 0743) BP: (115-140)/(67-84) 131/67 mmHg (02/09 0743) SpO2:  [92 %-96 %] 92 % (02/09 0743) Weight:  [225 lb 12 oz (102.4 kg)] 225 lb 12 oz (102.4 kg) (02/09 0426)  Intake/Output from previous day: 02/08 0701 - 02/09 0700 In: 2114.5 [P.O.:670; I.V.:1444.5] Out: 3075 [Urine:3075]  Physical Exam: Pt is alert and oriented, NAD HEENT: normal Neck: JVP - normal Lungs: CTA bilaterally CV: RRR without murmur or gallop Abd: soft, NT Ext: no C/C/E, distal pulses intact and equal Skin: warm/dry no rash  Lab Results:  Recent Labs  02/11/14 0340 02/12/14 0307  WBC 7.2 8.3  HGB 12.8* 12.6*  PLT 250 248    Recent Labs  02/10/14 0359  NA 137  K 3.8  CL 105  CO2 25  GLUCOSE 105*  BUN 24*  CREATININE 1.75*   No results for input(s): TROPONINI in the last 72 hours.  Invalid input(s): CK, MB  Tele: Sinus rhythm, personally reviewed  Assessment/Plan:  1. Unstable angina: multivessel CAD. Plans for CABG this Thursday per Dr Donata ClayVan Trigt. Continue IV heparin and NTG. Increased carvedilol to 12.5 mg BID yesterday. Continue other Rx without change.   2. PAF - agree with preop amiodarone to reduce risk of post-CABG AF. On heparin.  3. HTN - better controlled with increased coreg  4. Hyperlipidemia - on atorvastatin. Add lipids.  5. CKD, stage 3. Avoid ACE to reduce risk of acute kidney injury  Tonny BollmanMichael Leitha Hyppolite, M.D. 02/12/2014, 8:46 AM

## 2014-02-12 NOTE — Progress Notes (Signed)
ANTICOAGULATION CONSULT NOTE - Follow Up Consult  Pharmacy Consult for Heparin  Indication: atrial fibrillation  No Known Allergies  Patient Measurements: Height: 5\' 11"  (180.3 cm) Weight: 225 lb 12 oz (102.4 kg) IBW/kg (Calculated) : 75.3  Vital Signs: Temp: 98.2 F (36.8 C) (02/09 1200) Temp Source: Oral (02/09 1200) BP: 116/66 mmHg (02/09 1154) Pulse Rate: 71 (02/09 0743)  Labs:  Recent Labs  02/10/14 0357  02/10/14 0359 02/11/14 0340 02/12/14 0307  HGB  --   < > 13.6 12.8* 12.6*  HCT  --   --  40.8 39.6 38.3*  PLT  --   --  243 250 248  LABPROT  --   --  18.0* 15.8* 15.4*  INR  --   --  1.48 1.25 1.20  HEPARINUNFRC 0.52  --   --  0.61 0.39  CREATININE  --   --  1.75*  --   --   < > = values in this interval not displayed.  Estimated Creatinine Clearance: 45.8 mL/min (by C-G formula based on Cr of 1.75).   Assessment: 74yo male with Hx CAD and Afib, PE - on warfarin pta, admitted with chest tightness. Found to be in Afib with RVR but now in NSR. He is on heparin and holding warfarin for CABG planned for 02/14/2014.  Heparin level remains therapeutic today at 0.39.  Goal of Therapy:  Heparin level 0.3-0.7 units/ml Monitor platelets by anticoagulation protocol: Yes   Plan:  -Continue heparin drip at 1550 units/hr -Daily CBC and heparin level  Harland GermanAndrew Keelie Zemanek, Pharm D 02/12/2014 2:31 PM

## 2014-02-12 NOTE — Progress Notes (Signed)
4 Days Post-Op Procedure(s) (LRB): LEFT HEART CATHETERIZATION WITH CORONARY ANGIOGRAM (N/A) Subjective: 3 v CAD for CABG 2-11 No chest pain  Objective: Vital signs in last 24 hours: Temp:  [96.7 F (35.9 C)-98.5 F (36.9 C)] 98.2 F (36.8 C) (02/09 1200) Pulse Rate:  [71-81] 71 (02/09 0743) Cardiac Rhythm:  [-] Normal sinus rhythm (02/09 1400) Resp:  [20-26] 20 (02/09 1154) BP: (116-134)/(66-82) 116/66 mmHg (02/09 1154) SpO2:  [92 %-96 %] 93 % (02/09 1154) Weight:  [225 lb 12 oz (102.4 kg)] 225 lb 12 oz (102.4 kg) (02/09 0426)  Hemodynamic parameters for last 24 hours:  nsr  Intake/Output from previous day: 02/08 0701 - 02/09 0700 In: 2114.5 [P.O.:670; I.V.:1444.5] Out: 3075 [Urine:3075] Intake/Output this shift: Total I/O In: 1072 [P.O.:900; I.V.:172] Out: 800 [Urine:800]  Lungs clear extrem warm  Lab Results:  Recent Labs  02/11/14 0340 02/12/14 0307  WBC 7.2 8.3  HGB 12.8* 12.6*  HCT 39.6 38.3*  PLT 250 248   BMET:  Recent Labs  02/10/14 0359  NA 137  K 3.8  CL 105  CO2 25  GLUCOSE 105*  BUN 24*  CREATININE 1.75*  CALCIUM 8.7    PT/INR:  Recent Labs  02/12/14 0307  LABPROT 15.4*  INR 1.20   ABG No results found for: PHART, HCO3, TCO2, ACIDBASEDEF, O2SAT CBG (last 3)  No results for input(s): GLUCAP in the last 72 hours.  Assessment/Plan: S/P Procedure(s) (LRB): LEFT HEART CATHETERIZATION WITH CORONARY ANGIOGRAM (N/A) CABG thurs am   LOS: 8 days    Gregory Davila,Gregory Davila 02/12/2014

## 2014-02-13 ENCOUNTER — Other Ambulatory Visit: Payer: Self-pay | Admitting: *Deleted

## 2014-02-13 ENCOUNTER — Inpatient Hospital Stay (HOSPITAL_COMMUNITY): Payer: Medicare Other

## 2014-02-13 ENCOUNTER — Encounter (HOSPITAL_COMMUNITY): Payer: Self-pay | Admitting: Certified Registered Nurse Anesthetist

## 2014-02-13 DIAGNOSIS — Z01810 Encounter for preprocedural cardiovascular examination: Secondary | ICD-10-CM

## 2014-02-13 DIAGNOSIS — I2510 Atherosclerotic heart disease of native coronary artery without angina pectoris: Secondary | ICD-10-CM

## 2014-02-13 LAB — BLOOD GAS, ARTERIAL
Acid-base deficit: 0.9 mmol/L (ref 0.0–2.0)
Bicarbonate: 22.9 mEq/L (ref 20.0–24.0)
Drawn by: 227661
FIO2: 0.21 %
O2 Saturation: 94 %
Patient temperature: 98.6
TCO2: 24 mmol/L (ref 0–100)
pCO2 arterial: 35.9 mmHg (ref 35.0–45.0)
pH, Arterial: 7.421 (ref 7.350–7.450)
pO2, Arterial: 68.1 mmHg — ABNORMAL LOW (ref 80.0–100.0)

## 2014-02-13 LAB — BASIC METABOLIC PANEL
Anion gap: 8 (ref 5–15)
BUN: 17 mg/dL (ref 6–23)
CO2: 24 mmol/L (ref 19–32)
Calcium: 9.1 mg/dL (ref 8.4–10.5)
Chloride: 107 mmol/L (ref 96–112)
Creatinine, Ser: 1.81 mg/dL — ABNORMAL HIGH (ref 0.50–1.35)
GFR calc Af Amer: 41 mL/min — ABNORMAL LOW (ref >90)
GFR calc non Af Amer: 35 mL/min — ABNORMAL LOW (ref >90)
Glucose, Bld: 100 mg/dL — ABNORMAL HIGH (ref 70–99)
Potassium: 4.2 mmol/L (ref 3.5–5.1)
Sodium: 139 mmol/L (ref 135–145)

## 2014-02-13 LAB — URINE MICROSCOPIC-ADD ON

## 2014-02-13 LAB — URINALYSIS, ROUTINE W REFLEX MICROSCOPIC
Bilirubin Urine: NEGATIVE
Glucose, UA: NEGATIVE mg/dL
Ketones, ur: NEGATIVE mg/dL
Nitrite: NEGATIVE
Protein, ur: NEGATIVE mg/dL
Specific Gravity, Urine: 1.013 (ref 1.005–1.030)
Urobilinogen, UA: 1 mg/dL (ref 0.0–1.0)
pH: 6 (ref 5.0–8.0)

## 2014-02-13 LAB — CBC
HCT: 38.8 % — ABNORMAL LOW (ref 39.0–52.0)
Hemoglobin: 13 g/dL (ref 13.0–17.0)
MCH: 29.5 pg (ref 26.0–34.0)
MCHC: 33.5 g/dL (ref 30.0–36.0)
MCV: 88 fL (ref 78.0–100.0)
Platelets: 249 10*3/uL (ref 150–400)
RBC: 4.41 MIL/uL (ref 4.22–5.81)
RDW: 14.4 % (ref 11.5–15.5)
WBC: 8.1 10*3/uL (ref 4.0–10.5)

## 2014-02-13 LAB — SURGICAL PCR SCREEN
MRSA, PCR: POSITIVE — AB
Staphylococcus aureus: POSITIVE — AB

## 2014-02-13 LAB — LIPID PANEL
Cholesterol: 111 mg/dL (ref 0–200)
HDL: 34 mg/dL — ABNORMAL LOW (ref >39)
LDL Cholesterol: 59 mg/dL (ref 0–99)
Total CHOL/HDL Ratio: 3.3 RATIO
Triglycerides: 92 mg/dL (ref <150)
VLDL: 18 mg/dL (ref 0–40)

## 2014-02-13 LAB — PREPARE RBC (CROSSMATCH)

## 2014-02-13 LAB — ABO/RH: ABO/RH(D): O POS

## 2014-02-13 LAB — PROTIME-INR
INR: 1.21 (ref 0.00–1.49)
Prothrombin Time: 15.4 seconds — ABNORMAL HIGH (ref 11.6–15.2)

## 2014-02-13 LAB — HEPARIN LEVEL (UNFRACTIONATED): Heparin Unfractionated: 0.43 IU/mL (ref 0.30–0.70)

## 2014-02-13 MED ORDER — DEXMEDETOMIDINE HCL IN NACL 400 MCG/100ML IV SOLN
0.1000 ug/kg/h | INTRAVENOUS | Status: AC
Start: 2014-02-14 — End: 2014-02-14
  Administered 2014-02-14: 0.2 ug/kg/h via INTRAVENOUS
  Filled 2014-02-13: qty 100

## 2014-02-13 MED ORDER — DEXTROSE 5 % IV SOLN
750.0000 mg | INTRAVENOUS | Status: DC
  Filled 2014-02-13: qty 750

## 2014-02-13 MED ORDER — SODIUM CHLORIDE 0.9 % IV SOLN
INTRAVENOUS | Status: DC
  Filled 2014-02-13: qty 30

## 2014-02-13 MED ORDER — MUPIROCIN 2 % EX OINT
1.0000 "application " | TOPICAL_OINTMENT | Freq: Two times a day (BID) | CUTANEOUS | Status: AC
  Administered 2014-02-13 – 2014-02-18 (×9): 1 via NASAL

## 2014-02-13 MED ORDER — CHLORHEXIDINE GLUCONATE 4 % EX LIQD
60.0000 mL | Freq: Once | CUTANEOUS | Status: AC
  Administered 2014-02-14: 4 via TOPICAL
  Filled 2014-02-13: qty 60

## 2014-02-13 MED ORDER — BISACODYL 5 MG PO TBEC
5.0000 mg | DELAYED_RELEASE_TABLET | Freq: Once | ORAL | Status: AC
  Administered 2014-02-13: 5 mg via ORAL
  Filled 2014-02-13: qty 1

## 2014-02-13 MED ORDER — VANCOMYCIN HCL 10 G IV SOLR
1250.0000 mg | INTRAVENOUS | Status: AC
  Administered 2014-02-14: 1250 mg via INTRAVENOUS
  Filled 2014-02-13 (×2): qty 1250

## 2014-02-13 MED ORDER — NITROGLYCERIN IN D5W 200-5 MCG/ML-% IV SOLN
2.0000 ug/min | INTRAVENOUS | Status: DC
  Filled 2014-02-13: qty 250

## 2014-02-13 MED ORDER — ALPRAZOLAM 0.25 MG PO TABS: 0.2500 mg | ORAL_TABLET | ORAL | Status: DC | PRN

## 2014-02-13 MED ORDER — CHLORHEXIDINE GLUCONATE 4 % EX LIQD
60.0000 mL | Freq: Once | CUTANEOUS | Status: AC
  Administered 2014-02-13: 4 via TOPICAL
  Filled 2014-02-13: qty 60

## 2014-02-13 MED ORDER — PLASMA-LYTE 148 IV SOLN
INTRAVENOUS | Status: AC
  Administered 2014-02-14: 500 mL
  Filled 2014-02-13: qty 2.5

## 2014-02-13 MED ORDER — DOPAMINE-DEXTROSE 3.2-5 MG/ML-% IV SOLN
0.0000 ug/kg/min | INTRAVENOUS | Status: AC
  Administered 2014-02-14: 3 ug/kg/min via INTRAVENOUS
  Filled 2014-02-13: qty 250

## 2014-02-13 MED ORDER — POTASSIUM CHLORIDE 2 MEQ/ML IV SOLN
80.0000 meq | INTRAVENOUS | Status: DC
  Filled 2014-02-13: qty 40

## 2014-02-13 MED ORDER — SODIUM CHLORIDE 0.9 % IV SOLN
INTRAVENOUS | Status: AC
  Administered 2014-02-14: 1.2 [IU]/h via INTRAVENOUS
  Filled 2014-02-13: qty 2.5

## 2014-02-13 MED ORDER — EPINEPHRINE HCL 1 MG/ML IJ SOLN
0.0000 ug/min | INTRAVENOUS | Status: AC
  Administered 2014-02-14: 3 ug/min via INTRAVENOUS
  Filled 2014-02-13: qty 4

## 2014-02-13 MED ORDER — TEMAZEPAM 15 MG PO CAPS: 15.0000 mg | ORAL_CAPSULE | Freq: Once | ORAL | Status: DC | PRN

## 2014-02-13 MED ORDER — SODIUM CHLORIDE 0.9 % IV SOLN
INTRAVENOUS | Status: AC
  Administered 2014-02-14: 69.8 mL/h via INTRAVENOUS
  Filled 2014-02-13: qty 40

## 2014-02-13 MED ORDER — CHLORHEXIDINE GLUCONATE CLOTH 2 % EX PADS
6.0000 | MEDICATED_PAD | Freq: Every day | CUTANEOUS | Status: DC
  Administered 2014-02-14: 6 via TOPICAL

## 2014-02-13 MED ORDER — PHENYLEPHRINE HCL 10 MG/ML IJ SOLN
30.0000 ug/min | INTRAVENOUS | Status: AC
  Administered 2014-02-14: 250 ug/min via INTRAVENOUS
  Filled 2014-02-13: qty 2

## 2014-02-13 MED ORDER — METOPROLOL TARTRATE 12.5 MG HALF TABLET
12.5000 mg | ORAL_TABLET | Freq: Once | ORAL | Status: AC
  Administered 2014-02-14: 12.5 mg via ORAL
  Filled 2014-02-13: qty 1

## 2014-02-13 MED ORDER — DEXTROSE 5 % IV SOLN
1.5000 g | INTRAVENOUS | Status: AC
  Administered 2014-02-14: .75 g via INTRAVENOUS
  Administered 2014-02-14: 1.5 g via INTRAVENOUS
  Filled 2014-02-13: qty 1.5

## 2014-02-13 MED ORDER — MAGNESIUM SULFATE 50 % IJ SOLN
40.0000 meq | INTRAMUSCULAR | Status: DC
  Filled 2014-02-13: qty 10

## 2014-02-13 NOTE — Progress Notes (Signed)
ANTICOAGULATION CONSULT NOTE - Follow Up Consult  Pharmacy Consult for Heparin  Indication: atrial fibrillation  No Known Allergies  Patient Measurements: Height: 5\' 11"  (180.3 cm) Weight: 229 lb 11.5 oz (104.2 kg) IBW/kg (Calculated) : 75.3  Vital Signs: Temp: 97.3 F (36.3 C) (02/10 0738) Temp Source: Oral (02/10 0738) BP: 119/72 mmHg (02/10 0921) Pulse Rate: 80 (02/10 0921)  Labs:  Recent Labs  02/11/14 0340 02/12/14 0307 02/13/14 0230  HGB 12.8* 12.6* 13.0  HCT 39.6 38.3* 38.8*  PLT 250 248 249  LABPROT 15.8* 15.4* 15.4*  INR 1.25 1.20 1.21  HEPARINUNFRC 0.61 0.39 0.43  CREATININE  --   --  1.81*    Estimated Creatinine Clearance: 44.7 mL/min (by C-G formula based on Cr of 1.81).   Assessment: 74yo male with Hx CAD and Afib, PE - on warfarin pta, admitted with chest tightness. Marland Davila. He is on heparin and holding warfarin for CABG planned for 02/14/2014.  Heparin level =0.43.and is at goal.  Goal of Therapy:  Heparin level 0.3-0.7 units/ml Monitor platelets by anticoagulation protocol: Yes   Plan:  -Continue heparin drip at 1550 units/hr -Daily CBC and heparin level  Harland GermanAndrew Valisa Karpel, Pharm D 02/13/2014 11:35 AM

## 2014-02-13 NOTE — Progress Notes (Signed)
    Subjective:  Feels good. No CP or dyspnea.   Objective:  Vital Signs in the last 24 hours: Temp:  [97.3 F (36.3 C)-98.3 F (36.8 C)] 98.3 F (36.8 C) (02/10 1230) Pulse Rate:  [68-80] 80 (02/10 0921) Resp:  [18-20] 20 (02/10 0347) BP: (110-132)/(57-81) 123/80 mmHg (02/10 1230) SpO2:  [90 %-95 %] 93 % (02/10 1230) Weight:  [229 lb 11.5 oz (104.2 kg)] 229 lb 11.5 oz (104.2 kg) (02/10 0347)  Intake/Output from previous day: 02/09 0701 - 02/10 0700 In: 1613 [P.O.:1140; I.V.:473] Out: 1875 [Urine:1875]  Physical Exam: Pt is alert and oriented, NAD  Lab Results:  Recent Labs  02/12/14 0307 02/13/14 0230  WBC 8.3 8.1  HGB 12.6* 13.0  PLT 248 249    Recent Labs  02/13/14 0230  NA 139  K 4.2  CL 107  CO2 24  GLUCOSE 100*  BUN 17  CREATININE 1.81*   No results for input(s): TROPONINI in the last 72 hours.  Invalid input(s): CK, MB  Tele: Sinus rhythm  Assessment/Plan:  USAP for CABG tomorrow. Stable on current Rx which was reviewed. Maintaining sinus rhythm on oral amiodarone. Labs reviewed and look stable. Cholesterol panel reviewed and LDL at goal. All ?'s answered.  Tonny BollmanMichael Muhammed Teutsch, M.D. 02/13/2014, 1:50 PM

## 2014-02-14 ENCOUNTER — Inpatient Hospital Stay (HOSPITAL_COMMUNITY): Payer: Medicare Other | Admitting: Certified Registered Nurse Anesthetist

## 2014-02-14 ENCOUNTER — Inpatient Hospital Stay (HOSPITAL_COMMUNITY): Payer: Medicare Other

## 2014-02-14 ENCOUNTER — Encounter (HOSPITAL_COMMUNITY)
Admission: AD | Disposition: A | Discharge status: Home / Self Care | Payer: Medicare Other | Source: Other Acute Inpatient Hospital | Attending: Cardiothoracic Surgery

## 2014-02-14 DIAGNOSIS — Z951 Presence of aortocoronary bypass graft: Secondary | ICD-10-CM

## 2014-02-14 HISTORY — PX: TEE WITHOUT CARDIOVERSION: SHX5443

## 2014-02-14 HISTORY — PX: CORONARY ARTERY BYPASS GRAFT: SHX141

## 2014-02-14 LAB — POCT I-STAT, CHEM 8
BUN: 24 mg/dL — ABNORMAL HIGH (ref 6–23)
BUN: 15 mg/dL (ref 6–23)
BUN: 17 mg/dL (ref 6–23)
BUN: 15 mg/dL (ref 6–23)
BUN: 15 mg/dL (ref 6–23)
BUN: 15 mg/dL (ref 6–23)
BUN: 15 mg/dL (ref 6–23)
Calcium, Ion: 1.25 mmol/L (ref 1.13–1.30)
Calcium, Ion: 1.12 mmol/L — ABNORMAL LOW (ref 1.13–1.30)
Calcium, Ion: 1.25 mmol/L (ref 1.13–1.30)
Calcium, Ion: 1.08 mmol/L — ABNORMAL LOW (ref 1.13–1.30)
Calcium, Ion: 1.08 mmol/L — ABNORMAL LOW (ref 1.13–1.30)
Calcium, Ion: 1.24 mmol/L (ref 1.13–1.30)
Calcium, Ion: 1.24 mmol/L (ref 1.13–1.30)
Chloride: 102 mmol/L (ref 96–112)
Chloride: 101 mmol/L (ref 96–112)
Chloride: 97 mmol/L (ref 96–112)
Chloride: 99 mmol/L (ref 96–112)
Chloride: 100 mmol/L (ref 96–112)
Chloride: 100 mmol/L (ref 96–112)
Chloride: 101 mmol/L (ref 96–112)
Creatinine, Ser: 1.6 mg/dL — ABNORMAL HIGH (ref 0.50–1.35)
Creatinine, Ser: 1.4 mg/dL — ABNORMAL HIGH (ref 0.50–1.35)
Creatinine, Ser: 1.6 mg/dL — ABNORMAL HIGH (ref 0.50–1.35)
Creatinine, Ser: 1.3 mg/dL (ref 0.50–1.35)
Creatinine, Ser: 1.4 mg/dL — ABNORMAL HIGH (ref 0.50–1.35)
Creatinine, Ser: 1.4 mg/dL — ABNORMAL HIGH (ref 0.50–1.35)
Creatinine, Ser: 1.4 mg/dL — ABNORMAL HIGH (ref 0.50–1.35)
Glucose, Bld: 100 mg/dL — ABNORMAL HIGH (ref 70–99)
Glucose, Bld: 104 mg/dL — ABNORMAL HIGH (ref 70–99)
Glucose, Bld: 113 mg/dL — ABNORMAL HIGH (ref 70–99)
Glucose, Bld: 115 mg/dL — ABNORMAL HIGH (ref 70–99)
Glucose, Bld: 127 mg/dL — ABNORMAL HIGH (ref 70–99)
Glucose, Bld: 166 mg/dL — ABNORMAL HIGH (ref 70–99)
Glucose, Bld: 157 mg/dL — ABNORMAL HIGH (ref 70–99)
HCT: 38 % — ABNORMAL LOW (ref 39.0–52.0)
HCT: 29 % — ABNORMAL LOW (ref 39.0–52.0)
HCT: 35 % — ABNORMAL LOW (ref 39.0–52.0)
HCT: 29 % — ABNORMAL LOW (ref 39.0–52.0)
HCT: 28 % — ABNORMAL LOW (ref 39.0–52.0)
HCT: 41 % (ref 39.0–52.0)
HCT: 37 % — ABNORMAL LOW (ref 39.0–52.0)
Hemoglobin: 12.9 g/dL — ABNORMAL LOW (ref 13.0–17.0)
Hemoglobin: 11.9 g/dL — ABNORMAL LOW (ref 13.0–17.0)
Hemoglobin: 9.9 g/dL — ABNORMAL LOW (ref 13.0–17.0)
Hemoglobin: 9.9 g/dL — ABNORMAL LOW (ref 13.0–17.0)
Hemoglobin: 9.5 g/dL — ABNORMAL LOW (ref 13.0–17.0)
Hemoglobin: 13.9 g/dL (ref 13.0–17.0)
Hemoglobin: 12.6 g/dL — ABNORMAL LOW (ref 13.0–17.0)
Potassium: 4.9 mmol/L (ref 3.5–5.1)
Potassium: 4 mmol/L (ref 3.5–5.1)
Potassium: 4.5 mmol/L (ref 3.5–5.1)
Potassium: 5 mmol/L (ref 3.5–5.1)
Potassium: 5 mmol/L (ref 3.5–5.1)
Potassium: 4.2 mmol/L (ref 3.5–5.1)
Potassium: 4 mmol/L (ref 3.5–5.1)
Sodium: 137 mmol/L (ref 135–145)
Sodium: 135 mmol/L (ref 135–145)
Sodium: 137 mmol/L (ref 135–145)
Sodium: 135 mmol/L (ref 135–145)
Sodium: 135 mmol/L (ref 135–145)
Sodium: 137 mmol/L (ref 135–145)
Sodium: 137 mmol/L (ref 135–145)
TCO2: 25 mmol/L (ref 0–100)
TCO2: 21 mmol/L (ref 0–100)
TCO2: 22 mmol/L (ref 0–100)
TCO2: 22 mmol/L (ref 0–100)
TCO2: 21 mmol/L (ref 0–100)
TCO2: 19 mmol/L (ref 0–100)
TCO2: 21 mmol/L (ref 0–100)

## 2014-02-14 LAB — HEMOGLOBIN A1C
Hgb A1c MFr Bld: 5.8 % — ABNORMAL HIGH (ref 4.8–5.6)
Mean Plasma Glucose: 120 mg/dL

## 2014-02-14 LAB — HEMOGLOBIN AND HEMATOCRIT, BLOOD
HCT: 26.9 % — ABNORMAL LOW (ref 39.0–52.0)
Hemoglobin: 9.1 g/dL — ABNORMAL LOW (ref 13.0–17.0)

## 2014-02-14 LAB — POCT I-STAT 3, ART BLOOD GAS (G3+)
Acid-base deficit: 1 mmol/L (ref 0.0–2.0)
Acid-base deficit: 6 mmol/L — ABNORMAL HIGH (ref 0.0–2.0)
Acid-base deficit: 3 mmol/L — ABNORMAL HIGH (ref 0.0–2.0)
Acid-base deficit: 1 mmol/L (ref 0.0–2.0)
Acid-base deficit: 2 mmol/L (ref 0.0–2.0)
Bicarbonate: 24.9 mEq/L — ABNORMAL HIGH (ref 20.0–24.0)
Bicarbonate: 22 mEq/L (ref 20.0–24.0)
Bicarbonate: 25.3 mEq/L — ABNORMAL HIGH (ref 20.0–24.0)
Bicarbonate: 23 mEq/L (ref 20.0–24.0)
Bicarbonate: 22.6 mEq/L (ref 20.0–24.0)
O2 Saturation: 100 %
O2 Saturation: 89 %
O2 Saturation: 89 %
O2 Saturation: 99 %
O2 Saturation: 96 %
Patient temperature: 35.3
Patient temperature: 37
TCO2: 26 mmol/L (ref 0–100)
TCO2: 24 mmol/L (ref 0–100)
TCO2: 27 mmol/L (ref 0–100)
TCO2: 24 mmol/L (ref 0–100)
TCO2: 24 mmol/L (ref 0–100)
pCO2 arterial: 46.9 mmHg — ABNORMAL HIGH (ref 35.0–45.0)
pCO2 arterial: 51.8 mmHg — ABNORMAL HIGH (ref 35.0–45.0)
pCO2 arterial: 55 mmHg — ABNORMAL HIGH (ref 35.0–45.0)
pCO2 arterial: 36.8 mmHg (ref 35.0–45.0)
pCO2 arterial: 39 mmHg (ref 35.0–45.0)
pH, Arterial: 7.334 — ABNORMAL LOW (ref 7.350–7.450)
pH, Arterial: 7.236 — ABNORMAL LOW (ref 7.350–7.450)
pH, Arterial: 7.261 — ABNORMAL LOW (ref 7.350–7.450)
pH, Arterial: 7.405 (ref 7.350–7.450)
pH, Arterial: 7.37 (ref 7.350–7.450)
pO2, Arterial: 346 mmHg — ABNORMAL HIGH (ref 80.0–100.0)
pO2, Arterial: 68 mmHg — ABNORMAL LOW (ref 80.0–100.0)
pO2, Arterial: 59 mmHg — ABNORMAL LOW (ref 80.0–100.0)
pO2, Arterial: 115 mmHg — ABNORMAL HIGH (ref 80.0–100.0)
pO2, Arterial: 82 mmHg (ref 80.0–100.0)

## 2014-02-14 LAB — BASIC METABOLIC PANEL
Anion gap: 6 (ref 5–15)
BUN: 18 mg/dL (ref 6–23)
CO2: 25 mmol/L (ref 19–32)
Calcium: 9.1 mg/dL (ref 8.4–10.5)
Chloride: 105 mmol/L (ref 96–112)
Creatinine, Ser: 1.85 mg/dL — ABNORMAL HIGH (ref 0.50–1.35)
GFR calc Af Amer: 40 mL/min — ABNORMAL LOW (ref >90)
GFR calc non Af Amer: 34 mL/min — ABNORMAL LOW (ref >90)
Glucose, Bld: 105 mg/dL — ABNORMAL HIGH (ref 70–99)
Potassium: 4.2 mmol/L (ref 3.5–5.1)
Sodium: 136 mmol/L (ref 135–145)

## 2014-02-14 LAB — URINALYSIS W MICROSCOPIC (NOT AT ARMC)
Bilirubin Urine: NEGATIVE
Glucose, UA: NEGATIVE mg/dL
Hgb urine dipstick: NEGATIVE
Ketones, ur: NEGATIVE mg/dL
Leukocytes, UA: NEGATIVE
Nitrite: NEGATIVE
Protein, ur: NEGATIVE mg/dL
Specific Gravity, Urine: 1.012 (ref 1.005–1.030)
Urobilinogen, UA: 0.2 mg/dL (ref 0.0–1.0)
pH: 7 (ref 5.0–8.0)

## 2014-02-14 LAB — CREATININE, SERUM
Creatinine, Ser: 1.59 mg/dL — ABNORMAL HIGH (ref 0.50–1.35)
GFR calc Af Amer: 48 mL/min — ABNORMAL LOW (ref >90)
GFR calc non Af Amer: 41 mL/min — ABNORMAL LOW (ref >90)

## 2014-02-14 LAB — CBC
HCT: 39.5 % (ref 39.0–52.0)
HCT: 42.5 % (ref 39.0–52.0)
HCT: 35.4 % — ABNORMAL LOW (ref 39.0–52.0)
Hemoglobin: 13 g/dL (ref 13.0–17.0)
Hemoglobin: 14.1 g/dL (ref 13.0–17.0)
Hemoglobin: 11.9 g/dL — ABNORMAL LOW (ref 13.0–17.0)
MCH: 29.7 pg (ref 26.0–34.0)
MCH: 29.9 pg (ref 26.0–34.0)
MCH: 29 pg (ref 26.0–34.0)
MCHC: 32.9 g/dL (ref 30.0–36.0)
MCHC: 33.2 g/dL (ref 30.0–36.0)
MCHC: 33.6 g/dL (ref 30.0–36.0)
MCV: 90.2 fL (ref 78.0–100.0)
MCV: 90 fL (ref 78.0–100.0)
MCV: 86.3 fL (ref 78.0–100.0)
Platelets: 245 10*3/uL (ref 150–400)
Platelets: 218 10*3/uL (ref 150–400)
Platelets: 204 10*3/uL (ref 150–400)
RBC: 4.38 MIL/uL (ref 4.22–5.81)
RBC: 4.72 MIL/uL (ref 4.22–5.81)
RBC: 4.1 MIL/uL — ABNORMAL LOW (ref 4.22–5.81)
RDW: 14.5 % (ref 11.5–15.5)
RDW: 14.2 % (ref 11.5–15.5)
RDW: 14 % (ref 11.5–15.5)
WBC: 8.8 10*3/uL (ref 4.0–10.5)
WBC: 16.4 10*3/uL — ABNORMAL HIGH (ref 4.0–10.5)
WBC: 17.6 10*3/uL — ABNORMAL HIGH (ref 4.0–10.5)

## 2014-02-14 LAB — PROTIME-INR
INR: 1.1 (ref 0.00–1.49)
INR: 1.34 (ref 0.00–1.49)
Prothrombin Time: 14.3 seconds (ref 11.6–15.2)
Prothrombin Time: 16.8 seconds — ABNORMAL HIGH (ref 11.6–15.2)

## 2014-02-14 LAB — POCT I-STAT 4, (NA,K, GLUC, HGB,HCT)
Glucose, Bld: 205 mg/dL — ABNORMAL HIGH (ref 70–99)
HCT: 43 % (ref 39.0–52.0)
Hemoglobin: 14.6 g/dL (ref 13.0–17.0)
Potassium: 3.6 mmol/L (ref 3.5–5.1)
Sodium: 138 mmol/L (ref 135–145)

## 2014-02-14 LAB — GLUCOSE, CAPILLARY
Glucose-Capillary: 180 mg/dL — ABNORMAL HIGH (ref 70–99)
Glucose-Capillary: 162 mg/dL — ABNORMAL HIGH (ref 70–99)
Glucose-Capillary: 139 mg/dL — ABNORMAL HIGH (ref 70–99)
Glucose-Capillary: 121 mg/dL — ABNORMAL HIGH (ref 70–99)
Glucose-Capillary: 121 mg/dL — ABNORMAL HIGH (ref 70–99)
Glucose-Capillary: 122 mg/dL — ABNORMAL HIGH (ref 70–99)
Glucose-Capillary: 151 mg/dL — ABNORMAL HIGH (ref 70–99)
Glucose-Capillary: 115 mg/dL — ABNORMAL HIGH (ref 70–99)

## 2014-02-14 LAB — HEPARIN LEVEL (UNFRACTIONATED): Heparin Unfractionated: 0.5 IU/mL (ref 0.30–0.70)

## 2014-02-14 LAB — APTT: aPTT: 38 seconds — ABNORMAL HIGH (ref 24–37)

## 2014-02-14 LAB — MAGNESIUM: Magnesium: 2.1 mg/dL (ref 1.5–2.5)

## 2014-02-14 LAB — PLATELET COUNT: Platelets: 171 10*3/uL (ref 150–400)

## 2014-02-14 SURGERY — CORONARY ARTERY BYPASS GRAFTING (CABG)
Anesthesia: General | Site: Chest

## 2014-02-14 MED ORDER — CETYLPYRIDINIUM CHLORIDE 0.05 % MT LIQD
7.0000 mL | Freq: Four times a day (QID) | OROMUCOSAL | Status: DC
  Administered 2014-02-15 – 2014-02-21 (×23): 7 mL via OROMUCOSAL

## 2014-02-14 MED ORDER — FENTANYL CITRATE 0.05 MG/ML IJ SOLN
INTRAMUSCULAR | Status: AC
  Filled 2014-02-14: qty 5

## 2014-02-14 MED ORDER — PANTOPRAZOLE SODIUM 40 MG PO TBEC
40.0000 mg | DELAYED_RELEASE_TABLET | Freq: Every day | ORAL | Status: DC
  Administered 2014-02-16 – 2014-03-05 (×18): 40 mg via ORAL
  Filled 2014-02-14 (×17): qty 1

## 2014-02-14 MED ORDER — VECURONIUM BROMIDE 10 MG IV SOLR
INTRAVENOUS | Status: AC
Start: 2014-02-14 — End: 2014-02-14
  Filled 2014-02-14: qty 10

## 2014-02-14 MED ORDER — ACETAMINOPHEN 160 MG/5ML PO SOLN: 650.0000 mg | Freq: Once | ORAL | Status: AC

## 2014-02-14 MED ORDER — PROTAMINE SULFATE 10 MG/ML IV SOLN
INTRAVENOUS | Status: DC | PRN
  Administered 2014-02-14: 30 mg via INTRAVENOUS
  Administered 2014-02-14: 20 mg via INTRAVENOUS
  Administered 2014-02-14: 50 mg via INTRAVENOUS
  Administered 2014-02-14: 100 mg via INTRAVENOUS
  Administered 2014-02-14: 50 mg via INTRAVENOUS

## 2014-02-14 MED ORDER — MIDAZOLAM HCL 10 MG/2ML IJ SOLN
INTRAMUSCULAR | Status: AC
  Filled 2014-02-14: qty 2

## 2014-02-14 MED ORDER — AMINOCAPROIC ACID 250 MG/ML IV SOLN: INTRAVENOUS | Status: DC | PRN

## 2014-02-14 MED ORDER — SODIUM CHLORIDE 0.9 % IJ SOLN
3.0000 mL | Freq: Two times a day (BID) | INTRAMUSCULAR | Status: DC
  Administered 2014-02-15 – 2014-02-21 (×10): 3 mL via INTRAVENOUS

## 2014-02-14 MED ORDER — AMINOCAPROIC ACID 250 MG/ML IV SOLN
0.5000 g/h | Freq: Once | INTRAVENOUS | Status: DC
  Filled 2014-02-14: qty 20

## 2014-02-14 MED ORDER — STERILE WATER FOR INJECTION IJ SOLN
INTRAMUSCULAR | Status: AC
  Filled 2014-02-14: qty 10

## 2014-02-14 MED ORDER — HEPARIN SODIUM (PORCINE) 1000 UNIT/ML IJ SOLN
INTRAMUSCULAR | Status: AC
  Filled 2014-02-14: qty 1

## 2014-02-14 MED ORDER — 0.9 % SODIUM CHLORIDE (POUR BTL) OPTIME
TOPICAL | Status: DC | PRN
  Administered 2014-02-14: 1000 mL

## 2014-02-14 MED ORDER — DEXTROSE 5 % IV SOLN
1.5000 g | Freq: Two times a day (BID) | INTRAVENOUS | Status: AC
  Administered 2014-02-14 – 2014-02-16 (×4): 1.5 g via INTRAVENOUS
  Filled 2014-02-14 (×4): qty 1.5

## 2014-02-14 MED ORDER — SUCCINYLCHOLINE CHLORIDE 20 MG/ML IJ SOLN
INTRAMUSCULAR | Status: AC
  Filled 2014-02-14: qty 1

## 2014-02-14 MED ORDER — HYDROCORTISONE NA SUCCINATE PF 100 MG IJ SOLR
INTRAMUSCULAR | Status: AC
  Filled 2014-02-14: qty 2

## 2014-02-14 MED ORDER — FENTANYL CITRATE 0.05 MG/ML IJ SOLN
INTRAMUSCULAR | Status: DC | PRN
  Administered 2014-02-14: 50 ug via INTRAVENOUS
  Administered 2014-02-14: 500 ug via INTRAVENOUS
  Administered 2014-02-14 (×2): 250 ug via INTRAVENOUS
  Administered 2014-02-14 (×3): 100 ug via INTRAVENOUS

## 2014-02-14 MED ORDER — BISACODYL 10 MG RE SUPP: 10.0000 mg | Freq: Every day | RECTAL | Status: DC

## 2014-02-14 MED ORDER — SODIUM BICARBONATE 8.4 % IV SOLN
INTRAVENOUS | Status: AC
  Filled 2014-02-14: qty 50

## 2014-02-14 MED ORDER — METOPROLOL TARTRATE 25 MG/10 ML ORAL SUSPENSION
12.5000 mg | Freq: Two times a day (BID) | ORAL | Status: DC
  Filled 2014-02-14 (×7): qty 5

## 2014-02-14 MED ORDER — PROPOFOL 10 MG/ML IV BOLUS
INTRAVENOUS | Status: DC | PRN
  Administered 2014-02-14: 80 mg via INTRAVENOUS

## 2014-02-14 MED ORDER — ROCURONIUM BROMIDE 100 MG/10ML IV SOLN
INTRAVENOUS | Status: DC | PRN
  Administered 2014-02-14: 50 mg via INTRAVENOUS

## 2014-02-14 MED ORDER — HEMOSTATIC AGENTS (NO CHARGE) OPTIME
TOPICAL | Status: DC | PRN
  Administered 2014-02-14: 1 via TOPICAL

## 2014-02-14 MED ORDER — ALBUMIN HUMAN 5 % IV SOLN
250.0000 mL | INTRAVENOUS | Status: AC | PRN
  Administered 2014-02-14 – 2014-02-15 (×3): 250 mL via INTRAVENOUS
  Filled 2014-02-14 (×2): qty 250

## 2014-02-14 MED ORDER — EPINEPHRINE HCL 0.1 MG/ML IJ SOSY
PREFILLED_SYRINGE | INTRAMUSCULAR | Status: DC | PRN
  Administered 2014-02-14: 0.2 mg via INTRAVENOUS
  Administered 2014-02-14: 0.1 mg via INTRAVENOUS
  Administered 2014-02-14 (×3): 0.2 mg via INTRAVENOUS
  Administered 2014-02-14: .05 mg via INTRAVENOUS

## 2014-02-14 MED ORDER — DOPAMINE-DEXTROSE 3.2-5 MG/ML-% IV SOLN: 0.0000 ug/kg/min | INTRAVENOUS | Status: DC

## 2014-02-14 MED ORDER — METOPROLOL TARTRATE 12.5 MG HALF TABLET
12.5000 mg | ORAL_TABLET | Freq: Two times a day (BID) | ORAL | Status: DC
  Administered 2014-02-16 (×2): 12.5 mg via ORAL
  Filled 2014-02-14 (×7): qty 1

## 2014-02-14 MED ORDER — ARTIFICIAL TEARS OP OINT
TOPICAL_OINTMENT | OPHTHALMIC | Status: AC
  Filled 2014-02-14: qty 3.5

## 2014-02-14 MED ORDER — NOREPINEPHRINE BITARTRATE 1 MG/ML IV SOLN
0.0000 ug/min | INTRAVENOUS | Status: AC
  Administered 2014-02-14: 10 ug/min via INTRAVENOUS
  Filled 2014-02-14: qty 4

## 2014-02-14 MED ORDER — EPINEPHRINE HCL 0.1 MG/ML IJ SOSY
PREFILLED_SYRINGE | INTRAMUSCULAR | Status: AC
  Filled 2014-02-14: qty 20

## 2014-02-14 MED ORDER — DIPHENHYDRAMINE HCL 50 MG/ML IJ SOLN
INTRAMUSCULAR | Status: DC | PRN
  Administered 2014-02-14: 25 mg via INTRAVENOUS

## 2014-02-14 MED ORDER — EPINEPHRINE HCL 1 MG/ML IJ SOLN
10.0000 ug/min | INTRAVENOUS | Status: DC
  Administered 2014-02-15: 3 ug/min via INTRAVENOUS
  Filled 2014-02-14: qty 4

## 2014-02-14 MED ORDER — HEPARIN SODIUM (PORCINE) 1000 UNIT/ML IJ SOLN
INTRAMUSCULAR | Status: AC
  Filled 2014-02-14: qty 2

## 2014-02-14 MED ORDER — ASPIRIN 81 MG PO CHEW: 324.0000 mg | CHEWABLE_TABLET | Freq: Every day | ORAL | Status: DC

## 2014-02-14 MED ORDER — METOPROLOL TARTRATE 1 MG/ML IV SOLN
2.5000 mg | INTRAVENOUS | Status: DC | PRN
  Administered 2014-02-15 – 2014-02-16 (×3): 5 mg via INTRAVENOUS
  Administered 2014-02-16: 2.5 mg via INTRAVENOUS
  Administered 2014-02-16: 5 mg via INTRAVENOUS
  Administered 2014-02-16: 2.5 mg via INTRAVENOUS
  Administered 2014-02-17 (×3): 5 mg via INTRAVENOUS
  Filled 2014-02-14 (×8): qty 5

## 2014-02-14 MED ORDER — SODIUM CHLORIDE 0.45 % IV SOLN
INTRAVENOUS | Status: DC
  Administered 2014-02-14: 20 mL/h via INTRAVENOUS

## 2014-02-14 MED ORDER — VECURONIUM BROMIDE 10 MG IV SOLR
INTRAVENOUS | Status: DC | PRN
  Administered 2014-02-14 (×2): 5 mg via INTRAVENOUS
  Administered 2014-02-14: 4 mg via INTRAVENOUS
  Administered 2014-02-14: 6 mg via INTRAVENOUS

## 2014-02-14 MED ORDER — BISACODYL 5 MG PO TBEC
10.0000 mg | DELAYED_RELEASE_TABLET | Freq: Every day | ORAL | Status: DC
  Administered 2014-02-15 – 2014-02-20 (×4): 10 mg via ORAL
  Filled 2014-02-14 (×4): qty 2

## 2014-02-14 MED ORDER — VANCOMYCIN HCL IN DEXTROSE 1-5 GM/200ML-% IV SOLN
1000.0000 mg | Freq: Once | INTRAVENOUS | Status: AC
  Administered 2014-02-14: 1000 mg via INTRAVENOUS
  Filled 2014-02-14: qty 200

## 2014-02-14 MED ORDER — CHLORHEXIDINE GLUCONATE 0.12 % MT SOLN
15.0000 mL | Freq: Two times a day (BID) | OROMUCOSAL | Status: DC
  Administered 2014-02-14 – 2014-02-15 (×2): 15 mL via OROMUCOSAL
  Filled 2014-02-14 (×2): qty 15

## 2014-02-14 MED ORDER — LACTATED RINGERS IV SOLN
INTRAVENOUS | Status: DC | PRN
  Administered 2014-02-14 (×7): via INTRAVENOUS

## 2014-02-14 MED ORDER — LACTATED RINGERS IV SOLN: 500.0000 mL | Freq: Once | INTRAVENOUS | Status: DC | PRN

## 2014-02-14 MED ORDER — SODIUM CHLORIDE 0.9 % IJ SOLN
3.0000 mL | INTRAMUSCULAR | Status: DC | PRN
  Administered 2014-02-16 – 2014-02-17 (×3): 3 mL via INTRAVENOUS
  Filled 2014-02-14 (×3): qty 3

## 2014-02-14 MED ORDER — SODIUM CHLORIDE 0.9 % IV SOLN: 250.0000 mL | INTRAVENOUS | Status: DC

## 2014-02-14 MED ORDER — PHENYLEPHRINE 40 MCG/ML (10ML) SYRINGE FOR IV PUSH (FOR BLOOD PRESSURE SUPPORT)
PREFILLED_SYRINGE | INTRAVENOUS | Status: AC
  Filled 2014-02-14: qty 10

## 2014-02-14 MED ORDER — POTASSIUM CHLORIDE 10 MEQ/50ML IV SOLN
10.0000 meq | INTRAVENOUS | Status: AC
  Administered 2014-02-14 (×3): 10 meq via INTRAVENOUS

## 2014-02-14 MED ORDER — PROTAMINE SULFATE 10 MG/ML IV SOLN
INTRAVENOUS | Status: AC
  Filled 2014-02-14: qty 5

## 2014-02-14 MED ORDER — ACETAMINOPHEN 650 MG RE SUPP
650.0000 mg | Freq: Once | RECTAL | Status: AC
  Administered 2014-02-14: 650 mg via RECTAL

## 2014-02-14 MED ORDER — PHENYLEPHRINE HCL 10 MG/ML IJ SOLN
10.0000 mg | INTRAVENOUS | Status: DC | PRN
  Administered 2014-02-14: 50 ug/min via INTRAVENOUS

## 2014-02-14 MED ORDER — HYDROCORTISONE NA SUCCINATE PF 1000 MG IJ SOLR
INTRAMUSCULAR | Status: DC | PRN
  Administered 2014-02-14: 100 mg via INTRAVENOUS

## 2014-02-14 MED ORDER — ONDANSETRON HCL 4 MG/2ML IJ SOLN
4.0000 mg | Freq: Four times a day (QID) | INTRAMUSCULAR | Status: DC | PRN
  Administered 2014-02-15 – 2014-02-27 (×7): 4 mg via INTRAVENOUS
  Filled 2014-02-14 (×8): qty 2

## 2014-02-14 MED ORDER — DOCUSATE SODIUM 100 MG PO CAPS
200.0000 mg | ORAL_CAPSULE | Freq: Every day | ORAL | Status: DC
  Administered 2014-02-15 – 2014-02-20 (×5): 200 mg via ORAL
  Filled 2014-02-14 (×16): qty 2

## 2014-02-14 MED ORDER — MORPHINE SULFATE 2 MG/ML IJ SOLN
2.0000 mg | INTRAMUSCULAR | Status: DC | PRN
  Administered 2014-02-15 (×4): 2 mg via INTRAVENOUS
  Filled 2014-02-14 (×4): qty 1

## 2014-02-14 MED ORDER — ALBUTEROL SULFATE HFA 108 (90 BASE) MCG/ACT IN AERS
INHALATION_SPRAY | RESPIRATORY_TRACT | Status: DC | PRN
Start: 2014-02-14 — End: 2014-02-14
  Administered 2014-02-14 (×2): 4 via RESPIRATORY_TRACT

## 2014-02-14 MED ORDER — DIPHENHYDRAMINE HCL 50 MG/ML IJ SOLN
INTRAMUSCULAR | Status: AC
  Filled 2014-02-14: qty 1

## 2014-02-14 MED ORDER — NITROGLYCERIN IN D5W 200-5 MCG/ML-% IV SOLN: 0.0000 ug/min | INTRAVENOUS | Status: DC

## 2014-02-14 MED ORDER — CALCIUM CHLORIDE 10 % IV SOLN
INTRAVENOUS | Status: DC | PRN
  Administered 2014-02-14: 1 g via INTRAVENOUS

## 2014-02-14 MED ORDER — FAMOTIDINE IN NACL 20-0.9 MG/50ML-% IV SOLN
20.0000 mg | Freq: Two times a day (BID) | INTRAVENOUS | Status: DC
  Administered 2014-02-14: 20 mg via INTRAVENOUS

## 2014-02-14 MED ORDER — ALBUTEROL SULFATE HFA 108 (90 BASE) MCG/ACT IN AERS
INHALATION_SPRAY | RESPIRATORY_TRACT | Status: AC
  Filled 2014-02-14: qty 6.7

## 2014-02-14 MED ORDER — PROTAMINE SULFATE 10 MG/ML IV SOLN
INTRAVENOUS | Status: AC
  Filled 2014-02-14: qty 25

## 2014-02-14 MED ORDER — ACETAMINOPHEN 160 MG/5ML PO SOLN
1000.0000 mg | Freq: Four times a day (QID) | ORAL | Status: DC
  Administered 2014-02-14: 1000 mg

## 2014-02-14 MED ORDER — SODIUM CHLORIDE 0.9 % IV SOLN
INTRAVENOUS | Status: DC | PRN
  Administered 2014-02-14: 12:00:00 via INTRAVENOUS

## 2014-02-14 MED ORDER — TRAMADOL HCL 50 MG PO TABS
50.0000 mg | ORAL_TABLET | ORAL | Status: DC | PRN
  Administered 2014-02-15: 100 mg via ORAL
  Administered 2014-02-15 – 2014-02-19 (×3): 50 mg via ORAL
  Administered 2014-02-22 – 2014-02-26 (×3): 100 mg via ORAL
  Filled 2014-02-14: qty 2
  Filled 2014-02-14: qty 1
  Filled 2014-02-14: qty 2
  Filled 2014-02-14: qty 1
  Filled 2014-02-14: qty 2
  Filled 2014-02-14: qty 1
  Filled 2014-02-14: qty 2

## 2014-02-14 MED ORDER — CALCIUM CHLORIDE 10 % IV SOLN
INTRAVENOUS | Status: AC
  Filled 2014-02-14: qty 20

## 2014-02-14 MED ORDER — MIDAZOLAM HCL 5 MG/5ML IJ SOLN
INTRAMUSCULAR | Status: DC | PRN
  Administered 2014-02-14: 5 mg via INTRAVENOUS
  Administered 2014-02-14: 1 mg via INTRAVENOUS
  Administered 2014-02-14: 5 mg via INTRAVENOUS
  Administered 2014-02-14 (×2): 2 mg via INTRAVENOUS

## 2014-02-14 MED ORDER — INSULIN REGULAR BOLUS VIA INFUSION
0.0000 [IU] | Freq: Three times a day (TID) | INTRAVENOUS | Status: DC
  Filled 2014-02-14: qty 10

## 2014-02-14 MED ORDER — SODIUM CHLORIDE 0.9 % IJ SOLN
OROMUCOSAL | Status: DC | PRN
  Administered 2014-02-14 (×3): 1000 mL via TOPICAL

## 2014-02-14 MED ORDER — OXYCODONE HCL 5 MG PO TABS
5.0000 mg | ORAL_TABLET | ORAL | Status: DC | PRN
  Administered 2014-02-15: 10 mg via ORAL
  Administered 2014-02-16 – 2014-03-05 (×11): 5 mg via ORAL
  Filled 2014-02-14: qty 2
  Filled 2014-02-14 (×11): qty 1

## 2014-02-14 MED ORDER — ACETAMINOPHEN 500 MG PO TABS
1000.0000 mg | ORAL_TABLET | Freq: Four times a day (QID) | ORAL | Status: DC
  Administered 2014-02-15 – 2014-02-16 (×3): 1000 mg via ORAL
  Filled 2014-02-14 (×16): qty 2

## 2014-02-14 MED ORDER — ALBUMIN HUMAN 5 % IV SOLN
250.0000 mL | INTRAVENOUS | Status: DC | PRN
  Administered 2014-02-14 (×2): 250 mL via INTRAVENOUS

## 2014-02-14 MED ORDER — ALBUTEROL SULFATE (2.5 MG/3ML) 0.083% IN NEBU: 2.5000 mg | INHALATION_SOLUTION | RESPIRATORY_TRACT | Status: DC | PRN

## 2014-02-14 MED ORDER — ASPIRIN EC 325 MG PO TBEC
325.0000 mg | DELAYED_RELEASE_TABLET | Freq: Every day | ORAL | Status: DC
  Administered 2014-02-15 – 2014-02-18 (×4): 325 mg via ORAL
  Filled 2014-02-14 (×4): qty 1

## 2014-02-14 MED ORDER — PROPOFOL 10 MG/ML IV BOLUS
INTRAVENOUS | Status: AC
  Filled 2014-02-14: qty 20

## 2014-02-14 MED ORDER — HEPARIN SODIUM (PORCINE) 1000 UNIT/ML IJ SOLN
INTRAMUSCULAR | Status: DC | PRN
  Administered 2014-02-14 (×2): 2000 [IU] via INTRAVENOUS
  Administered 2014-02-14: 26000 [IU] via INTRAVENOUS

## 2014-02-14 MED ORDER — DEXMEDETOMIDINE HCL IN NACL 200 MCG/50ML IV SOLN
0.0000 ug/kg/h | INTRAVENOUS | Status: DC
  Administered 2014-02-14 (×2): 0.7 ug/kg/h via INTRAVENOUS
  Filled 2014-02-14 (×3): qty 50

## 2014-02-14 MED ORDER — ROCURONIUM BROMIDE 50 MG/5ML IV SOLN
INTRAVENOUS | Status: AC
  Filled 2014-02-14: qty 1

## 2014-02-14 MED ORDER — MAGNESIUM SULFATE 4 GM/100ML IV SOLN
4.0000 g | Freq: Once | INTRAVENOUS | Status: AC
  Administered 2014-02-14: 4 g via INTRAVENOUS
  Filled 2014-02-14: qty 100

## 2014-02-14 MED ORDER — MORPHINE SULFATE 2 MG/ML IJ SOLN
1.0000 mg | INTRAMUSCULAR | Status: AC | PRN
  Administered 2014-02-14 (×2): 2 mg via INTRAVENOUS
  Filled 2014-02-14 (×3): qty 1

## 2014-02-14 MED ORDER — SODIUM CHLORIDE 0.9 % IV SOLN
INTRAVENOUS | Status: DC
  Administered 2014-02-20: 10 mL/h via INTRAVENOUS
  Administered 2014-02-21: 16:00:00 via INTRAVENOUS

## 2014-02-14 MED ORDER — LACTATED RINGERS IV SOLN
INTRAVENOUS | Status: DC
  Administered 2014-02-16: 04:00:00 via INTRAVENOUS

## 2014-02-14 MED ORDER — MIDAZOLAM HCL 2 MG/2ML IJ SOLN: 2.0000 mg | INTRAMUSCULAR | Status: DC | PRN

## 2014-02-14 MED ORDER — INSULIN REGULAR HUMAN 100 UNIT/ML IJ SOLN
INTRAMUSCULAR | Status: DC
  Administered 2014-02-14: 2.5 [IU]/h via INTRAVENOUS
  Filled 2014-02-14 (×2): qty 2.5

## 2014-02-14 MED ORDER — LIDOCAINE HCL (CARDIAC) 20 MG/ML IV SOLN
INTRAVENOUS | Status: AC
  Filled 2014-02-14: qty 5

## 2014-02-14 MED ORDER — LIDOCAINE HCL (CARDIAC) 20 MG/ML IV SOLN
INTRAVENOUS | Status: DC | PRN
  Administered 2014-02-14: 40 mg via INTRAVENOUS

## 2014-02-14 MED ORDER — FUROSEMIDE 10 MG/ML IJ SOLN
INTRAMUSCULAR | Status: DC | PRN
  Administered 2014-02-14: 20 mg via INTRAMUSCULAR

## 2014-02-14 MED ORDER — EPHEDRINE SULFATE 50 MG/ML IJ SOLN
INTRAMUSCULAR | Status: AC
  Filled 2014-02-14: qty 1

## 2014-02-14 MED ORDER — PHENYLEPHRINE HCL 10 MG/ML IJ SOLN
0.0000 ug/min | INTRAVENOUS | Status: DC
  Administered 2014-02-15: 10 ug/min via INTRAVENOUS
  Administered 2014-02-16: 20 ug/min via INTRAVENOUS
  Filled 2014-02-14 (×3): qty 2

## 2014-02-14 MED ORDER — SODIUM BICARBONATE 8.4 % IV SOLN
INTRAVENOUS | Status: DC | PRN
  Administered 2014-02-14: 25 mL via INTRAVENOUS
  Administered 2014-02-14: 50 mL via INTRAVENOUS

## 2014-02-14 MED ORDER — VECURONIUM BROMIDE 10 MG IV SOLR
INTRAVENOUS | Status: AC
  Filled 2014-02-14: qty 10

## 2014-02-14 MED ORDER — ALBUMIN HUMAN 5 % IV SOLN
INTRAVENOUS | Status: DC | PRN
  Administered 2014-02-14 (×2): via INTRAVENOUS

## 2014-02-14 SURGICAL SUPPLY — 96 items
ADAPTER CARDIO PERF ANTE/RETRO (ADAPTER) ×4 IMPLANT
ATTRACTOMAT 16X20 MAGNETIC DRP (DRAPES) ×4 IMPLANT
BAG DECANTER FOR FLEXI CONT (MISCELLANEOUS) ×4 IMPLANT
BANDAGE ELASTIC 4 VELCRO ST LF (GAUZE/BANDAGES/DRESSINGS) ×4 IMPLANT
BANDAGE ELASTIC 6 VELCRO ST LF (GAUZE/BANDAGES/DRESSINGS) ×4 IMPLANT
BASKET HEART  (ORDER IN 25'S) (MISCELLANEOUS) ×1
BASKET HEART (ORDER IN 25'S) (MISCELLANEOUS) ×1
BASKET HEART (ORDER IN 25S) (MISCELLANEOUS) ×2 IMPLANT
BLADE MINI RND TIP GREEN BEAV (BLADE) ×4 IMPLANT
BLADE STERNUM SYSTEM 6 (BLADE) ×4 IMPLANT
BLADE SURG 12 STRL SS (BLADE) ×4 IMPLANT
BLADE SURG ROTATE 9660 (MISCELLANEOUS) IMPLANT
BNDG GAUZE ELAST 4 BULKY (GAUZE/BANDAGES/DRESSINGS) ×4 IMPLANT
CANISTER SUCTION 2500CC (MISCELLANEOUS) ×4 IMPLANT
CANNULA GUNDRY RCSP 15FR (MISCELLANEOUS) ×4 IMPLANT
CATH CPB KIT VANTRIGT (MISCELLANEOUS) ×4 IMPLANT
CATH ROBINSON RED A/P 18FR (CATHETERS) ×12 IMPLANT
CATH THORACIC 36FR RT ANG (CATHETERS) ×4 IMPLANT
CLIP FOGARTY SPRING 6M (CLIP) ×4 IMPLANT
CLIP RETRACTION 3.0MM CORONARY (MISCELLANEOUS) ×4 IMPLANT
CLIP TI MEDIUM 24 (CLIP) ×4 IMPLANT
CLIP TI WIDE RED SMALL 24 (CLIP) ×4 IMPLANT
COVER SURGICAL LIGHT HANDLE (MISCELLANEOUS) ×4 IMPLANT
CRADLE DONUT ADULT HEAD (MISCELLANEOUS) ×4 IMPLANT
DRAIN CHANNEL 32F RND 10.7 FF (WOUND CARE) ×4 IMPLANT
DRAPE CARDIOVASCULAR INCISE (DRAPES) ×2
DRAPE SLUSH/WARMER DISC (DRAPES) ×4 IMPLANT
DRAPE SRG 135X102X78XABS (DRAPES) ×2 IMPLANT
DRSG AQUACEL AG ADV 3.5X14 (GAUZE/BANDAGES/DRESSINGS) ×4 IMPLANT
ELECT BLADE 4.0 EZ CLEAN MEGAD (MISCELLANEOUS) ×4
ELECT BLADE 6.5 EXT (BLADE) ×4 IMPLANT
ELECT CAUTERY BLADE 6.4 (BLADE) ×4 IMPLANT
ELECT REM PT RETURN 9FT ADLT (ELECTROSURGICAL) ×8
ELECTRODE BLDE 4.0 EZ CLN MEGD (MISCELLANEOUS) ×2 IMPLANT
ELECTRODE REM PT RTRN 9FT ADLT (ELECTROSURGICAL) ×4 IMPLANT
GAUZE SPONGE 4X4 12PLY STRL (GAUZE/BANDAGES/DRESSINGS) ×8 IMPLANT
GLOVE BIO SURGEON STRL SZ7.5 (GLOVE) ×12 IMPLANT
GOWN STRL REUS W/ TWL LRG LVL3 (GOWN DISPOSABLE) ×8 IMPLANT
GOWN STRL REUS W/TWL LRG LVL3 (GOWN DISPOSABLE) ×8
HEMOSTAT POWDER SURGIFOAM 1G (HEMOSTASIS) ×12 IMPLANT
HEMOSTAT SURGICEL 2X14 (HEMOSTASIS) ×4 IMPLANT
INSERT FOGARTY XLG (MISCELLANEOUS) IMPLANT
KIT BASIN OR (CUSTOM PROCEDURE TRAY) ×4 IMPLANT
KIT ROOM TURNOVER OR (KITS) ×4 IMPLANT
KIT SUCTION CATH 14FR (SUCTIONS) ×4 IMPLANT
KIT VASOVIEW W/TROCAR VH 2000 (KITS) ×4 IMPLANT
LEAD PACING MYOCARDI (MISCELLANEOUS) ×4 IMPLANT
MARKER GRAFT CORONARY BYPASS (MISCELLANEOUS) ×12 IMPLANT
NS IRRIG 1000ML POUR BTL (IV SOLUTION) ×20 IMPLANT
PACK OPEN HEART (CUSTOM PROCEDURE TRAY) ×4 IMPLANT
PAD ARMBOARD 7.5X6 YLW CONV (MISCELLANEOUS) ×8 IMPLANT
PAD ELECT DEFIB RADIOL ZOLL (MISCELLANEOUS) ×4 IMPLANT
PENCIL BUTTON HOLSTER BLD 10FT (ELECTRODE) ×4 IMPLANT
PUNCH AORTIC ROTATE  4.5MM 8IN (MISCELLANEOUS) ×4 IMPLANT
PUNCH AORTIC ROTATE 4.0MM (MISCELLANEOUS) IMPLANT
PUNCH AORTIC ROTATE 4.5MM 8IN (MISCELLANEOUS) IMPLANT
PUNCH AORTIC ROTATE 5MM 8IN (MISCELLANEOUS) IMPLANT
SET CARDIOPLEGIA MPS 5001102 (MISCELLANEOUS) ×4 IMPLANT
SPONGE GAUZE 4X4 12PLY STER LF (GAUZE/BANDAGES/DRESSINGS) ×8 IMPLANT
SPONGE LAP 18X18 X RAY DECT (DISPOSABLE) ×8 IMPLANT
SPONGE LAP 4X18 X RAY DECT (DISPOSABLE) ×4 IMPLANT
SURGIFLO W/THROMBIN 8M KIT (HEMOSTASIS) ×4 IMPLANT
SUT BONE WAX W31G (SUTURE) ×4 IMPLANT
SUT MNCRL AB 4-0 PS2 18 (SUTURE) IMPLANT
SUT PROLENE 3 0 SH DA (SUTURE) ×12 IMPLANT
SUT PROLENE 3 0 SH1 36 (SUTURE) IMPLANT
SUT PROLENE 4 0 RB 1 (SUTURE) ×2
SUT PROLENE 4 0 SH DA (SUTURE) ×8 IMPLANT
SUT PROLENE 4-0 RB1 .5 CRCL 36 (SUTURE) ×2 IMPLANT
SUT PROLENE 5 0 C 1 36 (SUTURE) IMPLANT
SUT PROLENE 6 0 C 1 30 (SUTURE) ×4 IMPLANT
SUT PROLENE 6 0 CC (SUTURE) ×24 IMPLANT
SUT PROLENE 8 0 BV175 6 (SUTURE) ×4 IMPLANT
SUT PROLENE BLUE 7 0 (SUTURE) ×8 IMPLANT
SUT SILK  1 MH (SUTURE)
SUT SILK 1 MH (SUTURE) IMPLANT
SUT SILK 2 0 SH CR/8 (SUTURE) ×4 IMPLANT
SUT SILK 3 0 SH CR/8 (SUTURE) ×4 IMPLANT
SUT STEEL 6MS V (SUTURE) ×8 IMPLANT
SUT STEEL SZ 6 DBL 3X14 BALL (SUTURE) ×4 IMPLANT
SUT VIC AB 1 CTX 36 (SUTURE) ×8
SUT VIC AB 1 CTX36XBRD ANBCTR (SUTURE) ×8 IMPLANT
SUT VIC AB 2-0 CT1 27 (SUTURE) ×2
SUT VIC AB 2-0 CT1 TAPERPNT 27 (SUTURE) ×2 IMPLANT
SUT VIC AB 2-0 CTX 27 (SUTURE) IMPLANT
SUT VIC AB 3-0 X1 27 (SUTURE) ×4 IMPLANT
SUTURE E-PAK OPEN HEART (SUTURE) ×4 IMPLANT
SYSTEM SAHARA CHEST DRAIN ATS (WOUND CARE) ×4 IMPLANT
TAPE CLOTH SURG 4X10 WHT LF (GAUZE/BANDAGES/DRESSINGS) ×4 IMPLANT
TAPE PAPER 2X10 WHT MICROPORE (GAUZE/BANDAGES/DRESSINGS) ×4 IMPLANT
TOWEL OR 17X24 6PK STRL BLUE (TOWEL DISPOSABLE) ×8 IMPLANT
TOWEL OR 17X26 10 PK STRL BLUE (TOWEL DISPOSABLE) ×8 IMPLANT
TRAY FOLEY IC TEMP SENS 16FR (CATHETERS) ×4 IMPLANT
TUBING INSUFFLATION (TUBING) ×4 IMPLANT
UNDERPAD 30X30 INCONTINENT (UNDERPADS AND DIAPERS) ×4 IMPLANT
WATER STERILE IRR 1000ML POUR (IV SOLUTION) ×8 IMPLANT

## 2014-02-14 NOTE — Anesthesia Preprocedure Evaluation (Addendum)
Anesthesia Evaluation  Patient identified by MRN, date of birth, ID band Patient awake    Reviewed: Allergy & Precautions, NPO status , Patient's Chart, lab work & pertinent test results, reviewed documented beta blocker date and time   History of Anesthesia Complications Negative for: history of anesthetic complications  Airway Mallampati: II  TM Distance: >3 FB Neck ROM: Full    Dental  (+) Missing   Pulmonary shortness of breath, neg COPDformer smoker,  breath sounds clear to auscultation        Cardiovascular hypertension, Pt. on medications + angina with exertion + CAD, + Past MI and +CHF + dysrhythmias Atrial Fibrillation Rhythm:Regular     Neuro/Psych negative neurological ROS  negative psych ROS   GI/Hepatic negative GI ROS, Neg liver ROS,   Endo/Other  Morbid obesity  Renal/GU CRFRenal disease     Musculoskeletal   Abdominal   Peds  Hematology negative hematology ROS (+)   Anesthesia Other Findings   Reproductive/Obstetrics                            Anesthesia Physical Anesthesia Plan  ASA: IV  Anesthesia Plan: General   Post-op Pain Management:    Induction: Intravenous  Airway Management Planned: Oral ETT  Additional Equipment: Arterial line, Ultrasound Guidance Line Placement, CVP, PA Cath and TEE  Intra-op Plan:   Post-operative Plan: Post-operative intubation/ventilation  Informed Consent: I have reviewed the patients History and Physical, chart, labs and discussed the procedure including the risks, benefits and alternatives for the proposed anesthesia with the patient or authorized representative who has indicated his/her understanding and acceptance.   Dental advisory given  Plan Discussed with: CRNA and Surgeon  Anesthesia Plan Comments:         Anesthesia Quick Evaluation

## 2014-02-14 NOTE — Progress Notes (Signed)
Notified Dr. Donata ClayVan Trigt of rash clearing asked about weaning patient. RT aware of changes that Dr. Donata ClayVan Trigt requested to vent. Dr. Zenaida NieceVan Trigt rounded & made changes to vent. Aware of C.I. Readings. New orders written. Will cont. To monitor.

## 2014-02-14 NOTE — Anesthesia Postprocedure Evaluation (Signed)
Anesthesia Post Note  Patient: Gregory MapleRalph D Armato  Procedure(s) Performed: Procedure(s) (LRB): CORONARY ARTERY BYPASS GRAFTING (CABG)X4 LIMA-LAD; SVG-RCA(ENDARDERECTOMY); SVG-OM; SVG-DIAG (N/A) TRANSESOPHAGEAL ECHOCARDIOGRAM (TEE) (N/A)  Anesthesia type: General  Patient location: ICU  Post pain: Pain level controlled  Post assessment: Post-op Vital signs reviewed  Last Vitals:  Filed Vitals:   02/14/14 1530  BP: 122/75  Pulse: 89  Temp: 35.7 C  Resp: 18    Post vital signs: stable  Level of consciousness: Patient remains intubated per anesthesia plan  Complications: No apparent anesthesia complications

## 2014-02-14 NOTE — Progress Notes (Signed)
CT surgery p.m. Rounds  Status post multivessel CABG for preoperative non-ST elevation MI Remains intubated, weaning FiO2 and IMV Body rash from allergic reaction-protamine versus platelets-now almost resolved Weaning pressor worse Good urine output-with preoperative elevated creatinine we'll continue renal dose dopamine and wean epinephrine as tolerated Hope to extubate later tonight as oxygenation improves

## 2014-02-14 NOTE — Progress Notes (Signed)
Per Dr. Donata ClayVan Trigt, decreased FiO2 to 50%, rate 10.  Will get ABG in 1 hour.  Roselie AwkwardShannon Tysheem Accardo, RN

## 2014-02-14 NOTE — Progress Notes (Signed)
  Echocardiogram Echocardiogram Transesophageal has been performed.  Xzaiver Vayda FRANCES 02/14/2014, 10:41 AM

## 2014-02-14 NOTE — OR Nursing (Signed)
13:00 - 1st call to SICU charge nurse 

## 2014-02-14 NOTE — OR Nursing (Signed)
2nd call made to SICU 

## 2014-02-14 NOTE — Progress Notes (Signed)
The patient was examined and preop studies reviewed. There has been no change from the prior exam and the patient is ready for surgery.    Plan CABG on R Kuehnle today

## 2014-02-14 NOTE — Anesthesia Procedure Notes (Addendum)
Procedure Name: Intubation Date/Time: 02/14/2014 7:59 AM Performed by: Margaree MackintoshYACOUB, Josselyne Onofrio B Pre-anesthesia Checklist: Patient identified, Emergency Drugs available, Suction available, Patient being monitored and Timeout performed Patient Re-evaluated:Patient Re-evaluated prior to inductionOxygen Delivery Method: Circle system utilized Preoxygenation: Pre-oxygenation with 100% oxygen Intubation Type: IV induction Ventilation: Mask ventilation without difficulty and Oral airway inserted - appropriate to patient size Laryngoscope Size: Mac and 4 Grade View: Grade II Tube type: Oral Tube size: 7.5 mm Number of attempts: 1 Airway Equipment and Method: Stylet Placement Confirmation: ETT inserted through vocal cords under direct vision,  positive ETCO2 and breath sounds checked- equal and bilateral Secured at: 21 cm Tube secured with: Tape Dental Injury: Teeth and Oropharynx as per pre-operative assessment

## 2014-02-14 NOTE — Transfer of Care (Signed)
Immediate Anesthesia Transfer of Care Note  Patient: Gregory MapleRalph D Koenig  Procedure(s) Performed: Procedure(s): CORONARY ARTERY BYPASS GRAFTING (CABG)X4 LIMA-LAD; SVG-RCA(ENDARDERECTOMY); SVG-OM; SVG-DIAG (N/A) TRANSESOPHAGEAL ECHOCARDIOGRAM (TEE) (N/A)  Patient Location: ICU  Anesthesia Type:General  Level of Consciousness: sedated and unresponsive  Airway & Oxygen Therapy: Patient remains intubated per anesthesia plan and Patient placed on Ventilator (see vital sign flow sheet for setting)  Post-op Assessment: Report given to RN and Post -op Vital signs reviewed and stable  Post vital signs: Reviewed and stable  Last Vitals:  Filed Vitals:   02/14/14 0519  BP: 164/69  Pulse: 70  Temp:   Resp:     Complications: Suspected Reaction to Platelets vs Protamine

## 2014-02-14 NOTE — Brief Op Note (Addendum)
02/04/2014 - 02/14/2014      [address]       [place] [id number]             [phone]     02/04/2014 - 02/14/2014  12:05 PM  PATIENT:  Gregory Davila  74 y.o. male  PRE-OPERATIVE DIAGNOSIS:  CAD  POST-OPERATIVE DIAGNOSIS:  CAD  PROCEDURE:  Procedure(s): Coronary endarterectomy  CORONARY ARTERY BYPASS GRAFTING (CABG)X4 LIMA-LAD; SVG-RCA(ENDARDERECTOMY); SVG-OM; SVG-DIAG TRANSESOPHAGEAL ECHOCARDIOGRAM (TEE) EVH RIGHT LEG  SURGEON:  Surgeon(s): Kerin PernaPeter Van Trigt, MD  PHYSICIAN ASSISTANT: WAYNE GOLD PA-C  ANESTHESIA:   general  PATIENT CONDITION:  ICU - intubated and hemodynamically stable.  PRE-OPERATIVE WEIGHT: 103kg  EBL: SEE ANEST/PERFUSION RECORDS  COMPLICATIONS: NO KNOWN

## 2014-02-14 NOTE — Progress Notes (Signed)
RT note - Rate and VT change per Dr. Maren BeachVanTrigt

## 2014-02-15 ENCOUNTER — Inpatient Hospital Stay (HOSPITAL_COMMUNITY): Payer: Medicare Other

## 2014-02-15 ENCOUNTER — Encounter (HOSPITAL_COMMUNITY): Payer: Self-pay | Admitting: Cardiothoracic Surgery

## 2014-02-15 LAB — TRANSFUSION REACTION
DAT C3: NEGATIVE
Post RXN DAT IgG: NEGATIVE

## 2014-02-15 LAB — CREATININE, SERUM
Creatinine, Ser: 1.66 mg/dL — ABNORMAL HIGH (ref 0.50–1.35)
GFR calc Af Amer: 46 mL/min — ABNORMAL LOW (ref >90)
GFR calc non Af Amer: 39 mL/min — ABNORMAL LOW (ref >90)

## 2014-02-15 LAB — POCT I-STAT 3, ART BLOOD GAS (G3+)
Acid-base deficit: 5 mmol/L — ABNORMAL HIGH (ref 0.0–2.0)
Acid-base deficit: 2 mmol/L (ref 0.0–2.0)
Acid-base deficit: 3 mmol/L — ABNORMAL HIGH (ref 0.0–2.0)
Bicarbonate: 23.5 mEq/L (ref 20.0–24.0)
Bicarbonate: 21.8 mEq/L (ref 20.0–24.0)
Bicarbonate: 22.4 mEq/L (ref 20.0–24.0)
O2 Saturation: 89 %
O2 Saturation: 94 %
O2 Saturation: 94 %
Patient temperature: 35.9
Patient temperature: 37.7
Patient temperature: 37.4
TCO2: 25 mmol/L (ref 0–100)
TCO2: 23 mmol/L (ref 0–100)
TCO2: 24 mmol/L (ref 0–100)
pCO2 arterial: 54.1 mmHg — ABNORMAL HIGH (ref 35.0–45.0)
pCO2 arterial: 35.7 mmHg (ref 35.0–45.0)
pCO2 arterial: 40.2 mmHg (ref 35.0–45.0)
pH, Arterial: 7.239 — ABNORMAL LOW (ref 7.350–7.450)
pH, Arterial: 7.397 (ref 7.350–7.450)
pH, Arterial: 7.356 (ref 7.350–7.450)
pO2, Arterial: 63 mmHg — ABNORMAL LOW (ref 80.0–100.0)
pO2, Arterial: 75 mmHg — ABNORMAL LOW (ref 80.0–100.0)
pO2, Arterial: 75 mmHg — ABNORMAL LOW (ref 80.0–100.0)

## 2014-02-15 LAB — CBC
HCT: 32.9 % — ABNORMAL LOW (ref 39.0–52.0)
HCT: 31.8 % — ABNORMAL LOW (ref 39.0–52.0)
Hemoglobin: 11.2 g/dL — ABNORMAL LOW (ref 13.0–17.0)
Hemoglobin: 10.6 g/dL — ABNORMAL LOW (ref 13.0–17.0)
MCH: 29.9 pg (ref 26.0–34.0)
MCH: 29.5 pg (ref 26.0–34.0)
MCHC: 34 g/dL (ref 30.0–36.0)
MCHC: 33.3 g/dL (ref 30.0–36.0)
MCV: 88 fL (ref 78.0–100.0)
MCV: 88.6 fL (ref 78.0–100.0)
Platelets: 170 10*3/uL (ref 150–400)
Platelets: 150 10*3/uL (ref 150–400)
RBC: 3.74 MIL/uL — ABNORMAL LOW (ref 4.22–5.81)
RBC: 3.59 MIL/uL — ABNORMAL LOW (ref 4.22–5.81)
RDW: 14 % (ref 11.5–15.5)
RDW: 14.6 % (ref 11.5–15.5)
WBC: 16.8 10*3/uL — ABNORMAL HIGH (ref 4.0–10.5)
WBC: 15.3 10*3/uL — ABNORMAL HIGH (ref 4.0–10.5)

## 2014-02-15 LAB — BASIC METABOLIC PANEL WITH GFR
Anion gap: 8 (ref 5–15)
BUN: 14 mg/dL (ref 6–23)
CO2: 26 mmol/L (ref 19–32)
Calcium: 8.6 mg/dL (ref 8.4–10.5)
Chloride: 103 mmol/L (ref 96–112)
Creatinine, Ser: 1.63 mg/dL — ABNORMAL HIGH (ref 0.50–1.35)
GFR calc Af Amer: 47 mL/min — ABNORMAL LOW (ref >90)
GFR calc non Af Amer: 40 mL/min — ABNORMAL LOW (ref >90)
Glucose, Bld: 127 mg/dL — ABNORMAL HIGH (ref 70–99)
Potassium: 4.8 mmol/L (ref 3.5–5.1)
Sodium: 137 mmol/L (ref 135–145)

## 2014-02-15 LAB — GLUCOSE, CAPILLARY
Glucose-Capillary: 102 mg/dL — ABNORMAL HIGH (ref 70–99)
Glucose-Capillary: 102 mg/dL — ABNORMAL HIGH (ref 70–99)
Glucose-Capillary: 110 mg/dL — ABNORMAL HIGH (ref 70–99)
Glucose-Capillary: 114 mg/dL — ABNORMAL HIGH (ref 70–99)
Glucose-Capillary: 115 mg/dL — ABNORMAL HIGH (ref 70–99)
Glucose-Capillary: 117 mg/dL — ABNORMAL HIGH (ref 70–99)
Glucose-Capillary: 118 mg/dL — ABNORMAL HIGH (ref 70–99)
Glucose-Capillary: 119 mg/dL — ABNORMAL HIGH (ref 70–99)
Glucose-Capillary: 120 mg/dL — ABNORMAL HIGH (ref 70–99)
Glucose-Capillary: 121 mg/dL — ABNORMAL HIGH (ref 70–99)
Glucose-Capillary: 121 mg/dL — ABNORMAL HIGH (ref 70–99)
Glucose-Capillary: 122 mg/dL — ABNORMAL HIGH (ref 70–99)
Glucose-Capillary: 123 mg/dL — ABNORMAL HIGH (ref 70–99)
Glucose-Capillary: 125 mg/dL — ABNORMAL HIGH (ref 70–99)
Glucose-Capillary: 126 mg/dL — ABNORMAL HIGH (ref 70–99)
Glucose-Capillary: 130 mg/dL — ABNORMAL HIGH (ref 70–99)
Glucose-Capillary: 134 mg/dL — ABNORMAL HIGH (ref 70–99)
Glucose-Capillary: 87 mg/dL (ref 70–99)

## 2014-02-15 LAB — PROTIME-INR
INR: 1.32 (ref 0.00–1.49)
Prothrombin Time: 16.5 seconds — ABNORMAL HIGH (ref 11.6–15.2)

## 2014-02-15 LAB — PREPARE FRESH FROZEN PLASMA
Unit division: 0
Unit division: 0

## 2014-02-15 LAB — PREPARE PLATELET PHERESIS: Unit division: 0

## 2014-02-15 LAB — MAGNESIUM
Magnesium: 2 mg/dL (ref 1.5–2.5)
Magnesium: 2 mg/dL (ref 1.5–2.5)

## 2014-02-15 LAB — BASIC METABOLIC PANEL
Anion gap: 6 (ref 5–15)
BUN: 12 mg/dL (ref 6–23)
CO2: 25 mmol/L (ref 19–32)
Calcium: 8.5 mg/dL (ref 8.4–10.5)
Chloride: 106 mmol/L (ref 96–112)
Creatinine, Ser: 1.55 mg/dL — ABNORMAL HIGH (ref 0.50–1.35)
GFR calc Af Amer: 50 mL/min — ABNORMAL LOW (ref >90)
GFR calc non Af Amer: 43 mL/min — ABNORMAL LOW (ref >90)
Glucose, Bld: 127 mg/dL — ABNORMAL HIGH (ref 70–99)
Potassium: 4.5 mmol/L (ref 3.5–5.1)
Sodium: 137 mmol/L (ref 135–145)

## 2014-02-15 MED ORDER — AMIODARONE HCL IN DEXTROSE 360-4.14 MG/200ML-% IV SOLN
30.0000 mg/h | INTRAVENOUS | Status: DC
  Administered 2014-02-15 – 2014-02-20 (×11): 30 mg/h via INTRAVENOUS
  Filled 2014-02-15 (×23): qty 200

## 2014-02-15 MED ORDER — INSULIN ASPART 100 UNIT/ML ~~LOC~~ SOLN
0.0000 [IU] | SUBCUTANEOUS | Status: DC
  Administered 2014-02-15 – 2014-02-16 (×2): 2 [IU] via SUBCUTANEOUS

## 2014-02-15 MED ORDER — METOCLOPRAMIDE HCL 5 MG/ML IJ SOLN
10.0000 mg | Freq: Four times a day (QID) | INTRAMUSCULAR | Status: DC
  Administered 2014-02-15 – 2014-02-17 (×7): 10 mg via INTRAVENOUS
  Filled 2014-02-15 (×10): qty 2

## 2014-02-15 MED ORDER — WARFARIN SODIUM 2.5 MG PO TABS
2.5000 mg | ORAL_TABLET | Freq: Every day | ORAL | Status: DC
  Administered 2014-02-15 – 2014-02-17 (×3): 2.5 mg via ORAL
  Filled 2014-02-15 (×4): qty 1

## 2014-02-15 MED ORDER — INSULIN GLARGINE 100 UNIT/ML ~~LOC~~ SOLN
12.0000 [IU] | Freq: Two times a day (BID) | SUBCUTANEOUS | Status: DC
  Administered 2014-02-15 – 2014-02-20 (×12): 12 [IU] via SUBCUTANEOUS
  Filled 2014-02-15 (×14): qty 0.12

## 2014-02-15 MED ORDER — AMIODARONE HCL IN DEXTROSE 360-4.14 MG/200ML-% IV SOLN
INTRAVENOUS | Status: AC
  Filled 2014-02-15: qty 200

## 2014-02-15 MED ORDER — AMIODARONE LOAD VIA INFUSION
150.0000 mg | Freq: Once | INTRAVENOUS | Status: AC
  Administered 2014-02-15: 150 mg via INTRAVENOUS

## 2014-02-15 MED ORDER — FUROSEMIDE 10 MG/ML IJ SOLN
20.0000 mg | Freq: Every day | INTRAMUSCULAR | Status: DC
  Administered 2014-02-15: 20 mg via INTRAVENOUS
  Filled 2014-02-15: qty 2

## 2014-02-15 MED ORDER — WARFARIN - PHYSICIAN DOSING INPATIENT
Freq: Every day | Status: DC
  Administered 2014-02-16 – 2014-02-25 (×5)

## 2014-02-15 MED ORDER — ONDANSETRON HCL 4 MG/2ML IJ SOLN
INTRAMUSCULAR | Status: AC
  Administered 2014-02-15: 4 mg
  Filled 2014-02-15: qty 2

## 2014-02-15 MED ORDER — AMIODARONE HCL IN DEXTROSE 360-4.14 MG/200ML-% IV SOLN
60.0000 mg/h | INTRAVENOUS | Status: AC
  Administered 2014-02-15: 60 mg/h via INTRAVENOUS
  Filled 2014-02-15: qty 200

## 2014-02-15 MED FILL — Electrolyte-R (PH 7.4) Solution: INTRAVENOUS | Qty: 1000 | Status: AC

## 2014-02-15 MED FILL — Magnesium Sulfate Inj 50%: INTRAMUSCULAR | Qty: 10 | Status: AC

## 2014-02-15 MED FILL — Sodium Chloride IV Soln 0.9%: INTRAVENOUS | Qty: 2000 | Status: AC

## 2014-02-15 MED FILL — Heparin Sodium (Porcine) Inj 1000 Unit/ML: INTRAMUSCULAR | Qty: 20 | Status: AC

## 2014-02-15 MED FILL — Mannitol IV Soln 20%: INTRAVENOUS | Qty: 500 | Status: AC

## 2014-02-15 MED FILL — Potassium Chloride Inj 2 mEq/ML: INTRAVENOUS | Qty: 40 | Status: AC

## 2014-02-15 MED FILL — Sodium Bicarbonate IV Soln 8.4%: INTRAVENOUS | Qty: 50 | Status: AC

## 2014-02-15 MED FILL — Lidocaine HCl IV Inj 20 MG/ML: INTRAVENOUS | Qty: 5 | Status: AC

## 2014-02-15 MED FILL — Heparin Sodium (Porcine) Inj 1000 Unit/ML: INTRAMUSCULAR | Qty: 30 | Status: AC

## 2014-02-15 NOTE — Op Note (Signed)
Gregory Davila, MICHAUX NO.:  [id number]  MEDICAL RECORD NO.:  [id number]  LOCATION:  2S01C                        FACILITY:  [facility]  PHYSICIAN:  Kerin Perna, M.D.  DATE OF BIRTH:  [birth year]  DATE OF PROCEDURE:  02/15/2014 DATE OF DISCHARGE:                              OPERATIVE REPORT   OPERATION: 1. Coronary artery bypass grafting x4 (left internal mammary artery to     LAD, saphenous vein graft to first diagonal, saphenous vein graft     to obtuse marginal, saphenous vein graft to right coronary artery). 2. Coronary endarterectomy of the right coronary artery. 3. Endoscopic harvest of right leg greater saphenous vein.  SURGEON:  Kerin Perna, MD  ASSISTANT:  Gershon Crane, PA-C  ANESTHESIA:  General by Dr. Arta Bruce.  PREOPERATIVE DIAGNOSES: 1. Unstable angina with severe three-vessel coronary artery disease. 2. History of atrial fibrillation on chronic Coumadin therapy.  POSTOPERATIVE DIAGNOSES: 1. Unstable angina with severe three-vessel coronary artery disease. 2. History of atrial fibrillation on chronic Coumadin therapy.  CLINICAL NOTE:  The patient is a 74 year old, obese Caucasian nondiabetic male who presented to the hospital with symptoms of unstable angina without EKG changes.  However, his cardiac enzymes were positive, and he was admitted to Cardiology with a non-ST elevation MI.  The patient was allowed to normalize his INR and underwent cardiac catheterization.  He was found to have severe 3-vessel coronary artery disease with chronic occluded LAD, severe disease of a large dominant circumflex, and a nondominant right coronary with moderate stenosis.  EF was 40-45% with some anterior wall hypokinesia.  The patient was felt to be candidate for surgical coronary revascularization and was placed on IV heparin and nitroglycerin.  His preoperative studies were completed.  He had no evidence of DVT. Carotid study showed no significant  carotid stenosis.  Pulmonary function testing was satisfactory.  I reviewed the results of cardiac catheterization and his echocardiogram with the patient and family.  I discussed the role of multivessel CABG for treatment of his severe coronary artery disease.  I reviewed the details of the surgery including the location of the surgical incisions, the use of general anesthesia and cardiopulmonary bypass, and the expected postoperative hospital recovery.  I discussed with the patient the alternatives to cardiac surgery for treatment of his severe CAD.  I discussed with the patient the risks to him of CABG including risks of stroke, bleeding, MI, postoperative pulmonary problems including pneumonia and pleural effusions, postoperative arrhythmias and pacemaker requirement, wound infection, and death.  After reviewing these issues, he demonstrated his understanding and agreed to proceed with surgery under what I felt was an informed consent.  OPERATIVE FINDINGS: 1. Adequate conduit. 2. Severe CAD of his LAD diagonal system, small but graftable RCA     which required endarterectomy due to a heavy plaque and post     cardiopulmonary bypass coagulopathy, and then a possible     transfusion reaction and/or reaction to protamine with rash,     increased airway pressure, decreased peripheral vascular resistance     and blood pressure which responded to steroids, Benadryl, and     epinephrine.  OPERATIVE PROCEDURE:  The patient was brought to the operating room and placed supine on the operating table where general anesthesia was induced on invasive hemodynamic monitoring.  A transesophageal echo probe was placed by the anesthesiologist.  This showed mild LV dysfunction and mild aortic insufficiency.  The patient was prepped and draped as a sterile field.  A proper time-out was performed.  A sternal incision was made as the saphenous vein was harvested endoscopically from the right leg.  The  left internal mammary artery was harvested as a pedicle graft from its origin at the subclavian vessels.  It was a 1.5- mm vessel with excellent flow.  A sternal retractor was placed using the deep blades due to the patient's obese body habitus.  The pericardium was opened and suspended.  Heparin was administered and the ACT was documented as being therapeutic.  Pursestrings were placed in the ascending aorta and right atrium and the patient was cannulated and placed on cardiopulmonary bypass.  The coronary arteries were identified for grafting.  The vein and mammary artery were prepared for the distal anastomoses.  Cardioplegia cannulas were placed for both antegrade and retrograde cold blood cardioplegia.  The patient was cooled to 32 degrees and aortic crossclamp was applied.  The distal coronary anastomoses were performed.  The first distal anastomosis was to the right coronary.  This was a small but graftable vessel proximal 80% stenosis.  The anastomosis was placed beyond the acute margin of the right ventricle.  There was a heavy plaque in the wall of the vessel.  This necessitated a coronary endarterectomy.  The plaque was dissected from the adventitia and a proper plane was created. A small right angle was placed around the plaque and the plaque was divided.  The proximal portion of the plaque was teased from the proximal vessel using the microscopic spatula.  The plaque was submitted for pathology.  Next, the distal plaque was dissected from the adventitia of the distal vessel and a nice feathered tip was removed and submitted to pathology.  The vessel was irrigated with cold saline.  The vein was then sewn end-to-side with running 7-0 Prolene to the endarterectomized vessel with excellent flow through the graft. Cardioplegia was redosed.  The second distal anastomosis was to the OM branch of the left circumflex.  This was a large vessel with proximal 80% to 90% stenosis. A  reverse saphenous vein was sewn end-to-side with running 7-0 Prolene with excellent flow through the graft.  The cardioplegia was redosed.  The third distal anastomosis was to the diagonal branch to LAD.  This was a small 1.2-mm vessel with proximal 80% stenosis.  A reverse saphenous vein was sewn end-to-side with running 7-0 Prolene with good flow through the graft.  Cardioplegia was redosed.  The fourth distal anastomosis was to the mid portion of the LAD.  It was totally occluded proximally.  The left IMA pedicle was brought through an opening in the left lateral pericardium.  It was brought down onto the LAD and sewn end-to-side with running 8-0 Prolene.  There was excellent flow through the anastomosis after briefly releasing the pedicle bulldog and the mammary artery probe.  The bulldog was reapplied and the pedicle was secured to the epicardium with 6-0 Prolenes. Cardioplegia was redosed.  While the crossclamp was still in place, 3 proximal anastomoses were placed on the ascending aorta with a 4.5-mm punch and running 6-0 Prolene.  Prior to removing the crossclamp, air was vented from the coronaries with a dose  of retrograde warm blood cardioplegia.  The crossclamp was removed.  The heart resumed a spontaneous rhythm.  The vein grafts were de-aired and opened and each had good flow and hemostasis was documented at the proximal and distal sites.  The cardioplegia cannula was removed. Temporary pacing wires were applied.  The patient was rewarmed and reperfused.  The lungs re-expanded.  The patient was then weaned from cardiopulmonary bypass using the standard volume filling measures without difficulty.  The echo showed good global LV function. Hemodynamics were stable.  The cannulas were removed.  The patient received a dose of protamine test dose and then the full dose.  After the full dose of protamine, there was a transient drop in blood pressure which was treated with  volume.  The patient received a single dose of steroids and also received a dose of epinephrine.  This stabilized the hemodynamics.  The mediastinum was irrigated with sterile saline.  The superior pericardial fat was closed over the aorta.  The anterior mediastinal left pleural chest tubes were placed and brought out through separate incisions.  The sternum was closed with interrupted steel wire.  The patient remained stable.  The pectoralis fascia was closed with a running #1 Vicryl.  The subcutaneous and skin layers were closed with running Vicryl and sterile dressings were applied.  The patient returned to the ICU in critical but stable condition.     Kerin Perna, M.D.     PV/MEDQ  D:  02/14/2014  T:  02/15/2014  Job:  [id number]  cc:   Wyvonnia Lora, MD Thurmon Fair, MD

## 2014-02-15 NOTE — Progress Notes (Signed)
NIF -28cmH20, and VC 1.25L (>10cc/kg).

## 2014-02-15 NOTE — Procedures (Signed)
Extubation Procedure Note  Patient Details:   Name: Gregory Davila DOB: 1940-10-22 MRN: [id number]   Airway Documentation:     Evaluation  O2 sats: stable throughout Complications: No apparent complications Patient did tolerate procedure well. Bilateral Breath Sounds: Diminished   Yes   Extubated pt to 4LPM [place]. Cuff leak present. Pt able to speak. VS stable. No stridor noted. No complications.   Shonna ChockBurleson, Mariangel Ringley C 02/15/2014, 2:28 AM

## 2014-02-15 NOTE — Progress Notes (Signed)
1 Day Post-Op Procedure(s) (LRB): CORONARY ARTERY BYPASS GRAFTING (CABG)X4 LIMA-LAD; SVG-RCA(ENDARDERECTOMY); SVG-OM; SVG-DIAG (N/A) TRANSESOPHAGEAL ECHOCARDIOGRAM (TEE) (N/A) Subjective: Extubated, hemodynamics stable intraop protamine reaction resolved Will DC lies and diurese Renal fx stable- baseline creat is 1.6  Objective: Vital signs in last 24 hours: Temp:  [95.5 F (35.3 C)-100 F (37.8 C)] 98.8 F (37.1 C) (02/12 0700) Pulse Rate:  [80-90] 88 (02/12 0700) Cardiac Rhythm:  [-] Atrial paced (02/12 0000) Resp:  [10-23] 17 (02/12 0700) BP: (93-135)/(54-76) 101/54 mmHg (02/12 0700) SpO2:  [93 %-100 %] 95 % (02/12 0700) Arterial Line BP: (108-179)/(42-92) 109/49 mmHg (02/12 0700) FiO2 (%):  [40 %-100 %] 40 % (02/12 0140) Weight:  [240 lb 8.4 oz (109.1 kg)] 240 lb 8.4 oz (109.1 kg) (02/12 0400)  Hemodynamic parameters for last 24 hours: PAP: (20-34)/(8-18) 20/10 mmHg CO:  [0.5 L/min-8.5 L/min] 6.2 L/min CI:  [1.5 L/min/m2-3.8 L/min/m2] 2.8 L/min/m2  Intake/Output from previous day: 02/11 0701 - 02/12 0700 In: 16109.610948.2 [I.V.:7242.2; Blood:1276; IV Piggyback:2430] Out: 7770 [Urine:6000; Blood:1200; Chest Tube:570] Intake/Output this shift:    Neuro intact extrem warm nsr  Lab Results:  Recent Labs  02/14/14 2115 02/14/14 2126 02/15/14 0400  WBC 17.6*  --  16.8*  HGB 11.9* 12.6* 11.2*  HCT 35.4* 37.0* 32.9*  PLT 204  --  170   BMET:  Recent Labs  02/14/14 0352  02/14/14 2126 02/15/14 0400  NA 136  < > 137 137  K 4.2  < > 4.0 4.5  CL 105  < > 101 106  CO2 25  --   --  25  GLUCOSE 105*  < > 157* 127*  BUN 18  < > 15 12  CREATININE 1.85*  < > 1.40* 1.55*  CALCIUM 9.1  --   --  8.5  < > = values in this interval not displayed.  PT/INR:  Recent Labs  02/15/14 0400  LABPROT 16.5*  INR 1.32   ABG    Component Value Date/Time   PHART 7.356 02/15/2014 0402   HCO3 22.4 02/15/2014 0402   TCO2 24 02/15/2014 0402   ACIDBASEDEF 3.0* 02/15/2014 0402   O2SAT 94.0 02/15/2014 0402   CBG (last 3)   Recent Labs  02/14/14 2211 02/14/14 2304 02/15/14 0003  GLUCAP 115* 102* 126*    Assessment/Plan: S/P Procedure(s) (LRB): CORONARY ARTERY BYPASS GRAFTING (CABG)X4 LIMA-LAD; SVG-RCA(ENDARDERECTOMY); SVG-OM; SVG-DIAG (N/A) TRANSESOPHAGEAL ECHOCARDIOGRAM (TEE) (N/A) Mobilize Diuresis Diabetes control d/c tubes/lines See progression orders   LOS: 11 days    VAN TRIGT III,PETER 02/15/2014

## 2014-02-15 NOTE — Progress Notes (Signed)
Patient ID: Gregory Davila, male   DOB: 23-Feb-1940, 74 y.o.   MRN: [id number]  SICU Evening rounds:  Started on amio this am for A-fib with RVR. Converted to sinus but went back into A-fib with RVR this afternoon and converted again with IV lopressor. He is now sinus 80's. Dopamine weaned off and now on neo after the lopressor.   Urine ouput ok Creat up a little further this pm.  BMET    Component Value Date/Time   NA 137 02/15/2014 0400   K 4.5 02/15/2014 0400   CL 106 02/15/2014 0400   CO2 25 02/15/2014 0400   GLUCOSE 127* 02/15/2014 0400   BUN 12 02/15/2014 0400   CREATININE 1.66* 02/15/2014 1702   CALCIUM 8.5 02/15/2014 0400   GFRNONAA 39* 02/15/2014 1702   GFRAA 46* 02/15/2014 1702    Continue IV amio and follow renal function. Coumadin for A-fib.

## 2014-02-15 NOTE — Progress Notes (Signed)
Called the OR and relayed to Dr. Maren BeachVanTrigt that the pt had converted into afib. In the 150-160s and had already taken his oral dose of amiodarone.  Dr. Maren BeachVanTrigt ordered a bolus of amiodarone, amiodarone by protocol and 5mg  lopressor IV.

## 2014-02-16 ENCOUNTER — Inpatient Hospital Stay (HOSPITAL_COMMUNITY): Payer: Medicare Other

## 2014-02-16 LAB — CBC
HCT: 32.2 % — ABNORMAL LOW (ref 39.0–52.0)
Hemoglobin: 10.4 g/dL — ABNORMAL LOW (ref 13.0–17.0)
MCH: 29.5 pg (ref 26.0–34.0)
MCHC: 32.3 g/dL (ref 30.0–36.0)
MCV: 91.5 fL (ref 78.0–100.0)
Platelets: 136 10*3/uL — ABNORMAL LOW (ref 150–400)
RBC: 3.52 MIL/uL — ABNORMAL LOW (ref 4.22–5.81)
RDW: 15 % (ref 11.5–15.5)
WBC: 16 10*3/uL — ABNORMAL HIGH (ref 4.0–10.5)

## 2014-02-16 LAB — GLUCOSE, CAPILLARY
Glucose-Capillary: 100 mg/dL — ABNORMAL HIGH (ref 70–99)
Glucose-Capillary: 100 mg/dL — ABNORMAL HIGH (ref 70–99)
Glucose-Capillary: 102 mg/dL — ABNORMAL HIGH (ref 70–99)
Glucose-Capillary: 110 mg/dL — ABNORMAL HIGH (ref 70–99)
Glucose-Capillary: 111 mg/dL — ABNORMAL HIGH (ref 70–99)
Glucose-Capillary: 128 mg/dL — ABNORMAL HIGH (ref 70–99)

## 2014-02-16 LAB — BASIC METABOLIC PANEL
Anion gap: 3 — ABNORMAL LOW (ref 5–15)
BUN: 18 mg/dL (ref 6–23)
CO2: 31 mmol/L (ref 19–32)
Calcium: 8.4 mg/dL (ref 8.4–10.5)
Chloride: 103 mmol/L (ref 96–112)
Creatinine, Ser: 1.93 mg/dL — ABNORMAL HIGH (ref 0.50–1.35)
GFR calc Af Amer: 38 mL/min — ABNORMAL LOW (ref >90)
GFR calc non Af Amer: 33 mL/min — ABNORMAL LOW (ref >90)
Glucose, Bld: 132 mg/dL — ABNORMAL HIGH (ref 70–99)
Potassium: 4.4 mmol/L (ref 3.5–5.1)
Sodium: 137 mmol/L (ref 135–145)

## 2014-02-16 LAB — PROTIME-INR
INR: 1.26 (ref 0.00–1.49)
Prothrombin Time: 15.9 seconds — ABNORMAL HIGH (ref 11.6–15.2)

## 2014-02-16 MED ORDER — AMIODARONE LOAD VIA INFUSION
150.0000 mg | Freq: Once | INTRAVENOUS | Status: AC
  Administered 2014-02-16: 150 mg via INTRAVENOUS
  Filled 2014-02-16: qty 83.34

## 2014-02-16 MED ORDER — AMIODARONE IV BOLUS ONLY 150 MG/100ML
150.0000 mg | Freq: Once | INTRAVENOUS | Status: AC
  Administered 2014-02-16: 150 mg via INTRAVENOUS
  Filled 2014-02-16: qty 100

## 2014-02-16 MED ORDER — AMIODARONE IV BOLUS ONLY 150 MG/100ML
150.0000 mg | Freq: Once | INTRAVENOUS | Status: AC
  Administered 2014-02-16: 150 mg via INTRAVENOUS

## 2014-02-16 NOTE — Progress Notes (Signed)
Amio bolus being given through sleeve, there appears to be some leakage, MD notifed and orders received for 2nd bolus through PIV

## 2014-02-16 NOTE — Progress Notes (Signed)
   02/16/14 1846  Vitals  BP 113/65 mmHg  MAP (mmHg) 77  Pulse Rate (!) 124  ECG Heart Rate (!) 125  Resp 16  Oxygen Therapy  SpO2 95 %  additional metoprolol 2.5 mg IV given for continued afib with RVR

## 2014-02-16 NOTE — Progress Notes (Signed)
   02/16/14 1830  Vitals  BP 121/71 mmHg  MAP (mmHg) 84  Pulse Rate (!) 124  ECG Heart Rate (!) 133  Resp (!) 21  Oxygen Therapy  SpO2 95 %  Monitor shows Afib with RVR. Patient sitting on bedside commode and oxygen was off. Ambulated 100 ft at 1700 and tolerated well. Given metoprolol 2.5 mg IV at [id number]

## 2014-02-16 NOTE — Progress Notes (Signed)
Pt converted back to NSR with pacer backup at 2140 per monitor, strip saved

## 2014-02-16 NOTE — Progress Notes (Signed)
Amio bolus being given through PIV

## 2014-02-16 NOTE — Progress Notes (Signed)
Patient ID: Gregory MapleRalph D Davila, male   DOB: 1940-02-27, 74 y.o.   MRN: [id number]  SICU Evening Rounds:  Hemodynamically stable but remains in atrial fib with RVR 120's. He received a 150 bolus of amio through the sleeve but it may be leaking so not sure if he received it all. Will give him another 150 mg peripherally since that is his only other access.  Urine output is ok  Awake and alert watching a basketball game.

## 2014-02-16 NOTE — Progress Notes (Signed)
Patient ID: Gregory Davila, male   DOB: [birth year], 74 y.o.   MRN: [id number]    Subjective:  Post op pain mild cough/congestion Using pillow   Objective:  Vital Signs in the last 24 hours: Temp:  [97.8 F (36.6 C)-98.5 F (36.9 C)] 97.8 F (36.6 C) (02/13 0800) Pulse Rate:  [53-131] 76 (02/13 1145) Resp:  [11-23] 16 (02/13 1145) BP: (76-142)/(53-84) 113/60 mmHg (02/13 1145) SpO2:  [89 %-100 %] 93 % (02/13 1145) Arterial Line BP: (98-120)/(48-61) 98/48 mmHg (02/12 1415) Weight:  [107.9 kg (237 lb 14 oz)] 107.9 kg (237 lb 14 oz) (02/13 0500)  Intake/Output from previous day: 02/12 0701 - 02/13 0700 In: 1280.7 [P.O.:120; I.V.:1110.7; IV Piggyback:50] Out: 2680 [Urine:2520; Chest Tube:160]  Physical Exam: Affect appropriate Elderly white male  HEENT: normal Neck supple with no adenopathy JVP normal no bruits no thyromegaly Lungs basilar atelectasis  Heart:  S1/S2 no murmur, no rub, gallop or click post sternotomy  PMI normal Abdomen: benighn, BS positve, no tenderness, no AAA no bruit.  No HSM or HJR Distal pulses intact with no bruits No edema Neuro non-focal Skin warm and dry No muscular weakness   Lab Results:  Recent Labs  02/15/14 1702 02/16/14 0300  WBC 15.3* 16.0*  HGB 10.6* 10.4*  PLT 150 136*    Recent Labs  02/15/14 1715 02/16/14 0300  NA 137 137  K 4.8 4.4  CL 103 103  CO2 26 31  GLUCOSE 127* 132*  BUN 14 18  CREATININE 1.63* 1.93*     Current facility-administered medications:  .  0.45 % sodium chloride infusion, , Intravenous, Continuous, Wayne E Gold, PA-C, Last Rate: 20 mL/hr at 02/15/14 0300 .  0.9 %  sodium chloride infusion, 250 mL, Intravenous, Continuous, Wayne E Gold, PA-C, 0 mL at 02/15/14 0600 .  0.9 %  sodium chloride infusion, , Intravenous, Continuous, Wayne E Gold, PA-C, Stopped at 02/14/14 1700 .  acetaminophen (TYLENOL) tablet 1,000 mg, 1,000 mg, Oral, 4 times per day, 1,000 mg at 02/16/14 0616 **OR** acetaminophen  (TYLENOL) solution 1,000 mg, 1,000 mg, Per Tube, 4 times per day, Rowe Clack, PA-C, 1,000 mg at 02/14/14 2301 .  albuterol (PROVENTIL) (2.5 MG/3ML) 0.083% nebulizer solution 2.5 mg, 2.5 mg, Nebulization, Q3H PRN, Kerin Perna, MD .  [address] amiodarone (NEXTERONE) 1.8 mg/mL load via infusion 150 mg, 150 mg, Intravenous, Once, 150 mg at 02/15/14 1130 **FOLLOWED BY** [EXPIRED] amiodarone (NEXTERONE PREMIX) 360 MG/200ML (1.8 mg/mL) IV infusion, 60 mg/hr, Intravenous, Continuous, Last Rate: 33.3 mL/hr at 02/15/14 1200, 60 mg/hr at 02/15/14 1200 **FOLLOWED BY** amiodarone (NEXTERONE PREMIX) 360 MG/200ML (1.8 mg/mL) IV infusion, 30 mg/hr, Intravenous, Continuous, Kerin Perna, MD, Last Rate: 16.7 mL/hr at 02/16/14 0616, 30 mg/hr at 02/16/14 0616 .  antiseptic oral rinse (CPC / CETYLPYRIDINIUM CHLORIDE 0.05%) solution 7 mL, 7 mL, Mouth Rinse, QID, Kerin Perna, MD, 7 mL at 02/16/14 0400 .  aspirin EC tablet 325 mg, 325 mg, Oral, Daily, 325 mg at 02/16/14 1040 **OR** aspirin chewable tablet 324 mg, 324 mg, Per Tube, Daily, Wayne E Gold, PA-C .  atorvastatin (LIPITOR) tablet 40 mg, 40 mg, Oral, q1800, Laurey Morale, MD, 40 mg at 02/15/14 1731 .  bisacodyl (DULCOLAX) EC tablet 10 mg, 10 mg, Oral, Daily, 10 mg at 02/16/14 1041 **OR** bisacodyl (DULCOLAX) suppository 10 mg, 10 mg, Rectal, Daily, Wayne E Gold, PA-C .  docusate sodium (COLACE) capsule 200 mg, 200 mg, Oral, Daily, Wayne E Gold, PA-C, 200  mg at 02/16/14 1042 .  CBG monitoring, , , Q4H **AND** insulin aspart (novoLOG) injection 0-24 Units, 0-24 Units, Subcutaneous, 6 times per day, Kerin PernaPeter Van Trigt, MD, 2 Units at 02/15/14 2000 .  insulin glargine (LANTUS) injection 12 Units, 12 Units, Subcutaneous, BID, Kerin PernaPeter Van Trigt, MD, 12 Units at 02/16/14 1042 .  lactated ringers infusion, , Intravenous, Continuous, Wayne E Gold, PA-C, Last Rate: 10 mL/hr at 02/16/14 0334 .  metoCLOPramide (REGLAN) injection 10 mg, 10 mg, Intravenous, 4 times per  day, Kerin PernaPeter Van Trigt, MD, 10 mg at 02/16/14 0616 .  metoprolol (LOPRESSOR) injection 2.5-5 mg, 2.5-5 mg, Intravenous, Q2H PRN, Wayne E Gold, PA-C, 5 mg at 02/16/14 0636 .  metoprolol tartrate (LOPRESSOR) tablet 12.5 mg, 12.5 mg, Oral, BID, 12.5 mg at 02/16/14 1040 **OR** metoprolol tartrate (LOPRESSOR) 25 mg/10 mL oral suspension 12.5 mg, 12.5 mg, Per Tube, BID, Wayne E Gold, PA-C .  morphine 2 MG/ML injection 2-5 mg, 2-5 mg, Intravenous, Q1H PRN, Wayne E Gold, PA-C, 2 mg at 02/15/14 1301 .  mupirocin ointment (BACTROBAN) 2 % 1 application, 1 application, Nasal, BID, Thurmon FairMihai Croitoru, MD, 1 application at 02/16/14 1038 .  ondansetron (ZOFRAN) injection 4 mg, 4 mg, Intravenous, Q6H PRN, Wayne E Gold, PA-C, 4 mg at 02/15/14 0422 .  oxyCODONE (Oxy IR/ROXICODONE) immediate release tablet 5-10 mg, 5-10 mg, Oral, Q3H PRN, Wayne E Gold, PA-C, 10 mg at 02/15/14 1426 .  pantoprazole (PROTONIX) EC tablet 40 mg, 40 mg, Oral, Daily, Wayne E Gold, PA-C, 40 mg at 02/16/14 1042 .  phenylephrine (NEO-SYNEPHRINE) 20 mg in dextrose 5 % 250 mL (0.08 mg/mL) infusion, 0-100 mcg/min, Intravenous, Titrated, Wayne E Gold, PA-C, Stopped at 02/16/14 1115 .  sodium chloride 0.9 % injection 3 mL, 3 mL, Intravenous, Q12H, Wayne E Gold, PA-C, 3 mL at 02/16/14 1039 .  sodium chloride 0.9 % injection 3 mL, 3 mL, Intravenous, PRN, Wayne E Gold, PA-C, 3 mL at 02/16/14 1040 .  traMADol (ULTRAM) tablet 50-100 mg, 50-100 mg, Oral, Q4H PRN, Wayne E Gold, PA-C, 100 mg at 02/15/14 1758 .  warfarin (COUMADIN) tablet 2.5 mg, 2.5 mg, Oral, q1800, Kerin PernaPeter Van Trigt, MD, 2.5 mg at 02/15/14 1731 .  Warfarin - Physician Dosing Inpatient, , Does not apply, q1800, Colleen CanVeronda Pauline Bryk, [facility]PH  Tele: Sinus rhythm  Assessment/Plan:  PAF:  Chronic issue that ppt angina prior to redo.  This am NSR rates 70-80  Continue iv amiodarone  On coumadin prior to admission  2.5 mg given last night follow INR Continue beta blocker.   CAD:  Post redo routine post  op care slow progress   Charlton HawsPeter Julanne Schlueter, M.D. 02/16/2014, 12:04 PM

## 2014-02-16 NOTE — Plan of Care (Signed)
Problem: Problem: Cardiovascular Progression Goal: NO ARRHYTHMIAS Outcome: Not Progressing Pt going in and out of afib today

## 2014-02-16 NOTE — Progress Notes (Signed)
2 Days Post-Op Procedure(s) (LRB): CORONARY ARTERY BYPASS GRAFTING (CABG)X4 LIMA-LAD; SVG-RCA(ENDARDERECTOMY); SVG-OM; SVG-DIAG (N/A) TRANSESOPHAGEAL ECHOCARDIOGRAM (TEE) (N/A) Subjective:  No complaints  Objective: Vital signs in last 24 hours: Temp:  [97.8 F (36.6 C)-98.5 F (36.9 C)] 97.8 F (36.6 C) (02/13 0800) Pulse Rate:  [53-131] 77 (02/13 1315) Cardiac Rhythm:  [-] Normal sinus rhythm (02/13 1302) Resp:  [11-23] 17 (02/13 1315) BP: (76-142)/(53-84) 111/63 mmHg (02/13 1315) SpO2:  [83 %-100 %] 83 % (02/13 1315) Arterial Line BP: (98-120)/(48-61) 98/48 mmHg (02/12 1415) Weight:  [107.9 kg (237 lb 14 oz)] 107.9 kg (237 lb 14 oz) (02/13 0500)  Hemodynamic parameters for last 24 hours:    Intake/Output from previous day: 02/12 0701 - 02/13 0700 In: 1280.7 [P.O.:120; I.V.:1110.7; IV Piggyback:50] Out: 2680 [Urine:2520; Chest Tube:160] Intake/Output this shift: Total I/O In: 312.2 [I.V.:262.2; IV Piggyback:50] Out: 390 [Urine:360; Chest Tube:30]  General appearance: alert and cooperative Heart: regular rate and rhythm, S1, S2 normal, no murmur, click, rub or gallop Lungs: clear to auscultation bilaterally Extremities: edema moderate Wound: aquacell dressing on  Lab Results:  Recent Labs  02/15/14 1702 02/16/14 0300  WBC 15.3* 16.0*  HGB 10.6* 10.4*  HCT 31.8* 32.2*  PLT 150 136*   BMET:  Recent Labs  02/15/14 1715 02/16/14 0300  NA 137 137  K 4.8 4.4  CL 103 103  CO2 26 31  GLUCOSE 127* 132*  BUN 14 18  CREATININE 1.63* 1.93*  CALCIUM 8.6 8.4    PT/INR:  Recent Labs  02/16/14 0300  LABPROT 15.9*  INR 1.26   ABG    Component Value Date/Time   PHART 7.356 02/15/2014 0402   HCO3 22.4 02/15/2014 0402   TCO2 24 02/15/2014 0402   ACIDBASEDEF 3.0* 02/15/2014 0402   O2SAT 94.0 02/15/2014 0402   CBG (last 3)   Recent Labs  02/16/14 0345 02/16/14 0852 02/16/14 1231  GLUCAP 100* 111* 100*    Assessment/Plan: S/P Procedure(s)  (LRB): CORONARY ARTERY BYPASS GRAFTING (CABG)X4 LIMA-LAD; SVG-RCA(ENDARDERECTOMY); SVG-OM; SVG-DIAG (N/A) TRANSESOPHAGEAL ECHOCARDIOGRAM (TEE) (N/A)  Hemodynamically stable off neo  Maintaining sinus rhythm on amio. Will continue IV today. Coumadin for recurrent A-fib.  Stage III CKD: creat up some today after diuresis yesterday. Will hold off on further diuresis today and repeat in the am.  Diabetes: glucose under good control.  Remove chest tube.  Mobilize    LOS: 12 days    Aviella Disbrow K 02/16/2014

## 2014-02-16 NOTE — Progress Notes (Signed)
Dr. Laneta SimmersBartle notified patient back in afib with RVR at around 1815. Patient was given 5 mg IV lopressor at 1825. Patient continued  120-150 afib. Notified Dr. Laneta SimmersBartle and amiodarone bolus 150 mg IV given.

## 2014-02-17 ENCOUNTER — Inpatient Hospital Stay (HOSPITAL_COMMUNITY): Payer: Medicare Other

## 2014-02-17 DIAGNOSIS — I10 Essential (primary) hypertension: Secondary | ICD-10-CM

## 2014-02-17 LAB — TYPE AND SCREEN
ABO/RH(D): O POS
Antibody Screen: NEGATIVE
Unit division: 0
Unit division: 0

## 2014-02-17 LAB — GLUCOSE, CAPILLARY
Glucose-Capillary: 100 mg/dL — ABNORMAL HIGH (ref 70–99)
Glucose-Capillary: 115 mg/dL — ABNORMAL HIGH (ref 70–99)
Glucose-Capillary: 80 mg/dL (ref 70–99)
Glucose-Capillary: 89 mg/dL (ref 70–99)
Glucose-Capillary: 96 mg/dL (ref 70–99)
Glucose-Capillary: 99 mg/dL (ref 70–99)

## 2014-02-17 LAB — BASIC METABOLIC PANEL
Anion gap: 7 (ref 5–15)
BUN: 19 mg/dL (ref 6–23)
CO2: 26 mmol/L (ref 19–32)
Calcium: 8.4 mg/dL (ref 8.4–10.5)
Chloride: 102 mmol/L (ref 96–112)
Creatinine, Ser: 1.66 mg/dL — ABNORMAL HIGH (ref 0.50–1.35)
GFR calc Af Amer: 46 mL/min — ABNORMAL LOW (ref >90)
GFR calc non Af Amer: 39 mL/min — ABNORMAL LOW (ref >90)
Glucose, Bld: 104 mg/dL — ABNORMAL HIGH (ref 70–99)
Potassium: 3.9 mmol/L (ref 3.5–5.1)
Sodium: 135 mmol/L (ref 135–145)

## 2014-02-17 LAB — PROTIME-INR
INR: 1.29 (ref 0.00–1.49)
Prothrombin Time: 16.2 seconds — ABNORMAL HIGH (ref 11.6–15.2)

## 2014-02-17 MED ORDER — POTASSIUM CHLORIDE 10 MEQ/50ML IV SOLN
10.0000 meq | INTRAVENOUS | Status: AC
  Administered 2014-02-17: 10 meq via INTRAVENOUS
  Filled 2014-02-17: qty 50

## 2014-02-17 MED ORDER — METOPROLOL TARTRATE 25 MG PO TABS
25.0000 mg | ORAL_TABLET | Freq: Two times a day (BID) | ORAL | Status: DC
  Administered 2014-02-17 – 2014-02-19 (×6): 25 mg via ORAL
  Filled 2014-02-17 (×8): qty 1

## 2014-02-17 MED ORDER — FUROSEMIDE 10 MG/ML IJ SOLN
80.0000 mg | Freq: Once | INTRAMUSCULAR | Status: AC
  Administered 2014-02-17: 80 mg via INTRAVENOUS
  Filled 2014-02-17: qty 8

## 2014-02-17 MED ORDER — DIGOXIN 0.25 MG/ML IJ SOLN
0.5000 mg | Freq: Once | INTRAMUSCULAR | Status: AC
  Administered 2014-02-18: 0.5 mg via INTRAVENOUS
  Filled 2014-02-17: qty 2

## 2014-02-17 MED ORDER — AMIODARONE LOAD VIA INFUSION
150.0000 mg | Freq: Once | INTRAVENOUS | Status: DC
  Filled 2014-02-17: qty 83.34

## 2014-02-17 MED ORDER — POTASSIUM CHLORIDE 10 MEQ/50ML IV SOLN
INTRAVENOUS | Status: AC
  Administered 2014-02-17: 10 meq
  Filled 2014-02-17: qty 50

## 2014-02-17 MED ORDER — AMIODARONE IV BOLUS ONLY 150 MG/100ML: 150.0000 mg | Freq: Once | INTRAVENOUS | Status: DC

## 2014-02-17 MED ORDER — AMIODARONE IV BOLUS ONLY 150 MG/100ML
150.0000 mg | Freq: Once | INTRAVENOUS | Status: AC
  Administered 2014-02-17: 150 mg via INTRAVENOUS
  Filled 2014-02-17 (×2): qty 100

## 2014-02-17 NOTE — Progress Notes (Signed)
PROGRESS NOTE  Subjective:    Gregory Davila is a 74 y.o. male with a history of CAD treated medically, PAF on coumadin, HTN, CKD, remote PE, last evaluated for chest pain in 2013,    Was found to have severe 3 V CAD with mild LV dysfunction. Had CABG. Has had A-fib post op, getting amiodarone.  Has been restarted on coumadin.  Gradual progress.       Objective:    Vital Signs:   Temp:  [97.8 F (36.6 C)-99.2 F (37.3 C)] 98.1 F (36.7 C) (02/14 0900) Pulse Rate:  [73-136] 111 (02/14 0730) Resp:  [13-22] 16 (02/14 0730) BP: (95-135)/(54-109) 126/109 mmHg (02/14 0730) SpO2:  [83 %-97 %] 97 % (02/14 0730) Weight:  [236 lb 15.9 oz (107.5 kg)] 236 lb 15.9 oz (107.5 kg) (02/14 0600)  Last BM Date: 02/17/14   24-hour weight change: Weight change: -14.1 oz (-0.4 kg)  Weight trends: Filed Weights   02/15/14 0400 02/16/14 0500 02/17/14 0600  Weight: 240 lb 8.4 oz (109.1 kg) 237 lb 14 oz (107.9 kg) 236 lb 15.9 oz (107.5 kg)    Intake/Output:  02/13 0701 - 02/14 0700 In: 1964.8 [P.O.:1040; I.V.:874.8; IV Piggyback:50] Out: 1490 [Urine:1460; Chest Tube:30] Total I/O In: -  Out: 125 [Urine:125]   Physical Exam: BP 126/109 mmHg  Pulse 111  Temp(Src) 98.1 F (36.7 C) (Oral)  Resp 16  Ht 5\' 11"  (1.803 m)  Wt 236 lb 15.9 oz (107.5 kg)  BMI 33.07 kg/m2  SpO2 97%  Wt Readings from Last 3 Encounters:  02/17/14 236 lb 15.9 oz (107.5 kg)  04/30/13 224 lb 6.4 oz (101.787 kg)  04/03/12 211 lb (95.709 kg)    General: Vital signs reviewed and noted. Seems comfortable. Sitting up in chair  Head: Normocephalic, atraumatic.  Eyes: conjunctivae/corneas clear.  EOM's intact.   Throat: normal  Neck:  normal   Lungs:    clear   Heart:  Irreg. Irreg. Tachycardic   Abdomen:  Soft, non-tender, non-distended    Extremities: Trace edema    Neurologic: A&O X3, CN II - XII are grossly intact.   Psych: Normal     Labs: BMET:  Recent Labs  02/15/14 0400  02/15/14 1702  02/16/14 0300 02/17/14 0430  NA 137  --   < > 137 135  K 4.5  --   < > 4.4 3.9  CL 106  --   < > 103 102  CO2 25  --   < > 31 26  GLUCOSE 127*  --   < > 132* 104*  BUN 12  --   < > 18 19  CREATININE 1.55* 1.66*  < > 1.93* 1.66*  CALCIUM 8.5  --   < > 8.4 8.4  MG 2.0 2.0  --   --   --   < > = values in this interval not displayed.  Liver function tests: No results for input(s): AST, ALT, ALKPHOS, BILITOT, PROT, ALBUMIN in the last 72 hours. No results for input(s): LIPASE, AMYLASE in the last 72 hours.  CBC:  Recent Labs  02/15/14 1702 02/16/14 0300  WBC 15.3* 16.0*  HGB 10.6* 10.4*  HCT 31.8* 32.2*  MCV 88.6 91.5  PLT 150 136*    Cardiac Enzymes: No results for input(s): CKTOTAL, CKMB, TROPONINI in the last 72 hours.  Coagulation Studies:  Recent Labs  02/14/14 1435 02/15/14 0400 02/16/14 0300 02/17/14 0430  LABPROT 16.8* 16.5* 15.9* 16.2*  INR 1.34 1.32 1.26 1.29    Other: Invalid input(s): POCBNP No results for input(s): DDIMER in the last 72 hours. No results for input(s): HGBA1C in the last 72 hours. No results for input(s): CHOL, HDL, LDLCALC, TRIG, CHOLHDL in the last 72 hours. No results for input(s): TSH, T4TOTAL, T3FREE, THYROIDAB in the last 72 hours.  Invalid input(s): FREET3 No results for input(s): VITAMINB12, FOLATE, FERRITIN, TIBC, IRON, RETICCTPCT in the last 72 hours.   Other results:  Tele   ( personally reviewed )  -  Atrial fib with RVR 130s.    Medications:    Infusions: . sodium chloride 20 mL/hr at 02/15/14 0300  . sodium chloride    . sodium chloride Stopped (02/14/14 1700)  . amiodarone 30 mg/hr (02/17/14 0300)  . lactated ringers 10 mL/hr at 02/16/14 1800  . phenylephrine (NEO-SYNEPHRINE) Adult infusion Stopped (02/16/14 1115)    Scheduled Medications: . acetaminophen  1,000 mg Oral 4 times per day   Or  . acetaminophen (TYLENOL) oral liquid 160 mg/5 mL  1,000 mg Per Tube 4 times per day  .  antiseptic oral rinse  7 mL Mouth Rinse QID  . aspirin EC  325 mg Oral Daily   Or  . aspirin  324 mg Per Tube Daily  . atorvastatin  40 mg Oral q1800  . bisacodyl  10 mg Oral Daily   Or  . bisacodyl  10 mg Rectal Daily  . docusate sodium  200 mg Oral Daily  . insulin aspart  0-24 Units Subcutaneous 6 times per day  . insulin glargine  12 Units Subcutaneous BID  . metoCLOPramide (REGLAN) injection  10 mg Intravenous 4 times per day  . metoprolol tartrate  12.5 mg Oral BID   Or  . metoprolol tartrate  12.5 mg Per Tube BID  . mupirocin ointment  1 application Nasal BID  . pantoprazole  40 mg Oral Daily  . sodium chloride  3 mL Intravenous Q12H  . warfarin  2.5 mg Oral q1800  . Warfarin - Physician Dosing Inpatient   Does not apply q1800    Assessment/ Plan:   Principal Problem:   Coronary atherosclerosis of native coronary artery Active Problems:   Paroxysmal atrial fibrillation   Hypertension   Renal insufficiency   Atrial fibrillation with RVR   Unstable angina   S/P CABG x 4  1. Atrial fib with RVR.  HR is still very fast. Will continue amiodarone.  Increase metoprolol.   INR is 1.29 - just started comadin back  2.   CAD:  S/p CABG  3. Mild chronic systolic CHF:  S/p CABG.  Continue metoprolol Will add additional CHF med soon as tolerated.     Disposition:  Length of Stay: 49  Vesta Mixer, Montez Hageman., MD, [facility] 02/17/2014, 10:35 AM Office [phone] Pager [phone]

## 2014-02-17 NOTE — Progress Notes (Signed)
3 Days Post-Op Procedure(s) (LRB): CORONARY ARTERY BYPASS GRAFTING (CABG)X4 LIMA-LAD; SVG-RCA(ENDARDERECTOMY); SVG-OM; SVG-DIAG (N/A) TRANSESOPHAGEAL ECHOCARDIOGRAM (TEE) (N/A) Subjective:  No complaints  Objective: Vital signs in last 24 hours: Temp:  [97.8 F (36.6 C)-99.4 F (37.4 C)] 99.4 F (37.4 C) (02/14 1230) Pulse Rate:  [71-136] 71 (02/14 1230) Cardiac Rhythm:  [-] Normal sinus rhythm (02/14 1215) Resp:  [12-22] 16 (02/14 1230) BP: (93-135)/(54-109) 95/56 mmHg (02/14 1230) SpO2:  [90 %-97 %] 97 % (02/14 1230) Weight:  [107.5 kg (236 lb 15.9 oz)] 107.5 kg (236 lb 15.9 oz) (02/14 0600)  Hemodynamic parameters for last 24 hours:    Intake/Output from previous day: 02/13 0701 - 02/14 0700 In: 1964.8 [P.O.:1040; I.V.:874.8; IV Piggyback:50] Out: 1490 [Urine:1460; Chest Tube:30] Intake/Output this shift: Total I/O In: 742.7 [P.O.:360; I.V.:332.7; IV Piggyback:50] Out: 660 [Urine:660]  General appearance: alert and cooperative Heart: regular rate and rhythm, S1, S2 normal, no murmur, click, rub or gallop Lungs: clear to auscultation bilaterally Abdomen: soft, non-tender; bowel sounds normal; no masses,  no organomegaly Extremities: edema mild Wound: aquacel dressing in place.  Lab Results:  Recent Labs  02/15/14 1702 02/16/14 0300  WBC 15.3* 16.0*  HGB 10.6* 10.4*  HCT 31.8* 32.2*  PLT 150 136*   BMET:  Recent Labs  02/16/14 0300 02/17/14 0430  NA 137 135  K 4.4 3.9  CL 103 102  CO2 31 26  GLUCOSE 132* 104*  BUN 18 19  CREATININE 1.93* 1.66*  CALCIUM 8.4 8.4    PT/INR:  Recent Labs  02/17/14 0430  LABPROT 16.2*  INR 1.29   ABG    Component Value Date/Time   PHART 7.356 02/15/2014 0402   HCO3 22.4 02/15/2014 0402   TCO2 24 02/15/2014 0402   ACIDBASEDEF 3.0* 02/15/2014 0402   O2SAT 94.0 02/15/2014 0402   CBG (last 3)   Recent Labs  02/17/14 0435 02/17/14 0916 02/17/14 1240  GLUCAP 89 96 80    Assessment/Plan: S/P Procedure(s)  (LRB): CORONARY ARTERY BYPASS GRAFTING (CABG)X4 LIMA-LAD; SVG-RCA(ENDARDERECTOMY); SVG-OM; SVG-DIAG (N/A) TRANSESOPHAGEAL ECHOCARDIOGRAM (TEE) (N/A)  He has been hemodynamically stable  Recurrent atrial fibrillation: He received another bolus of amio this am and has been maintained on the drip. Currently in sinus 74. Lopressor increased.  Stage 3 CKD: creat down a little from yesterday. He is probably still 7 lbs over preop wt and has peripheral edema. Will diurese further.  Continue mobilization, IS.   LOS: 13 days    BARTLE,BRYAN K 02/17/2014

## 2014-02-17 NOTE — Plan of Care (Signed)
Problem: Phase II - Intermediate Post-Op Goal: Activity Progressed Outcome: Progressing Progressing. Ambulated 100 feet early am and 200 feet in the early am.

## 2014-02-17 NOTE — Progress Notes (Signed)
UR Completed.  [phone] 02/17/2014

## 2014-02-17 NOTE — Progress Notes (Signed)
   02/17/14 1750  Vitals  BP 116/75 mmHg  MAP (mmHg) 85  Pulse Rate (!) 127  ECG Heart Rate (!) 123  Resp 18  Oxygen Therapy  SpO2 95 %  1738- slowly given metoprolol 5 mg IV slowly for afib with RVR 20-140's.

## 2014-02-18 DIAGNOSIS — Z951 Presence of aortocoronary bypass graft: Secondary | ICD-10-CM

## 2014-02-18 LAB — GLUCOSE, CAPILLARY
Glucose-Capillary: 106 mg/dL — ABNORMAL HIGH (ref 70–99)
Glucose-Capillary: 106 mg/dL — ABNORMAL HIGH (ref 70–99)
Glucose-Capillary: 108 mg/dL — ABNORMAL HIGH (ref 70–99)
Glucose-Capillary: 109 mg/dL — ABNORMAL HIGH (ref 70–99)
Glucose-Capillary: 114 mg/dL — ABNORMAL HIGH (ref 70–99)
Glucose-Capillary: 136 mg/dL — ABNORMAL HIGH (ref 70–99)
Glucose-Capillary: 94 mg/dL (ref 70–99)

## 2014-02-18 LAB — BASIC METABOLIC PANEL
Anion gap: 6 (ref 5–15)
BUN: 25 mg/dL — ABNORMAL HIGH (ref 6–23)
CO2: 31 mmol/L (ref 19–32)
Calcium: 8.4 mg/dL (ref 8.4–10.5)
Chloride: 98 mmol/L (ref 96–112)
Creatinine, Ser: 1.73 mg/dL — ABNORMAL HIGH (ref 0.50–1.35)
GFR calc Af Amer: 43 mL/min — ABNORMAL LOW (ref >90)
GFR calc non Af Amer: 37 mL/min — ABNORMAL LOW (ref >90)
Glucose, Bld: 118 mg/dL — ABNORMAL HIGH (ref 70–99)
Potassium: 4 mmol/L (ref 3.5–5.1)
Sodium: 135 mmol/L (ref 135–145)

## 2014-02-18 LAB — PROTIME-INR
INR: 1.12 (ref 0.00–1.49)
Prothrombin Time: 14.5 seconds (ref 11.6–15.2)

## 2014-02-18 LAB — CLOSTRIDIUM DIFFICILE BY PCR: Toxigenic C. Difficile by PCR: POSITIVE — AB

## 2014-02-18 MED ORDER — ASPIRIN EC 81 MG PO TBEC
81.0000 mg | DELAYED_RELEASE_TABLET | Freq: Every day | ORAL | Status: DC
  Administered 2014-02-19: 81 mg via ORAL
  Filled 2014-02-18 (×2): qty 1

## 2014-02-18 MED ORDER — DIGOXIN 0.25 MG/ML IJ SOLN
0.2500 mg | Freq: Once | INTRAMUSCULAR | Status: AC
  Administered 2014-02-18: 0.25 mg via INTRAVENOUS
  Filled 2014-02-18: qty 1

## 2014-02-18 MED ORDER — ASPIRIN 81 MG PO CHEW: 81.0000 mg | CHEWABLE_TABLET | Freq: Every day | ORAL | Status: DC

## 2014-02-18 MED ORDER — INSULIN ASPART 100 UNIT/ML ~~LOC~~ SOLN
0.0000 [IU] | Freq: Three times a day (TID) | SUBCUTANEOUS | Status: DC
  Administered 2014-02-19 – 2014-02-23 (×5): 2 [IU] via SUBCUTANEOUS

## 2014-02-18 MED ORDER — VANCOMYCIN 50 MG/ML ORAL SOLUTION
125.0000 mg | Freq: Four times a day (QID) | ORAL | Status: DC
  Administered 2014-02-18 – 2014-02-24 (×27): 125 mg via ORAL
  Filled 2014-02-18 (×32): qty 2.5

## 2014-02-18 MED ORDER — WARFARIN SODIUM 5 MG PO TABS
5.0000 mg | ORAL_TABLET | Freq: Every day | ORAL | Status: DC
  Administered 2014-02-18 – 2014-02-26 (×9): 5 mg via ORAL
  Filled 2014-02-18 (×10): qty 1

## 2014-02-18 MED ORDER — MUPIROCIN 2 % EX OINT
1.0000 "application " | TOPICAL_OINTMENT | Freq: Two times a day (BID) | CUTANEOUS | Status: AC
  Administered 2014-02-18 – 2014-02-22 (×9): 1 via NASAL
  Filled 2014-02-18: qty 22

## 2014-02-18 NOTE — Progress Notes (Signed)
       Patient Name: Gregory MapleRalph D Dungan Date of Encounter: 02/18/2014    SUBJECTIVE:Sitting in chair. No dyspnea. Mild incisional pain. He is having multiple BM. He notices abdominal bloating.  Now C. Dif positive.  TELEMETRY: A fib with variable VR poor rate control. Filed Vitals:   02/18/14 0730 02/18/14 0800 02/18/14 0900 02/18/14 1000  BP: 122/67 138/80 126/77 122/81  Pulse: 113 113 120 89  Temp:  98.1 F (36.7 C)    TempSrc:  Oral    Resp: 16 15 20 25   Height:      Weight:      SpO2: 94% 94% 93% 96%    Intake/Output Summary (Last 24 hours) at 02/18/14 1100 Last data filed at 02/18/14 1000  Gross per 24 hour  Intake 1244.1 ml  Output   3185 ml  Net -1940.9 ml   LABS: Basic Metabolic Panel:  Recent Labs  16/10/9600/12/16 1702  02/17/14 0430 02/18/14 0242  NA  --   < > 135 135  K  --   < > 3.9 4.0  CL  --   < > 102 98  CO2  --   < > 26 31  GLUCOSE  --   < > 104* 118*  BUN  --   < > 19 25*  CREATININE 1.66*  < > 1.66* 1.73*  CALCIUM  --   < > 8.4 8.4  MG 2.0  --   --   --   < > = values in this interval not displayed. CBC:  Recent Labs  02/15/14 1702 02/16/14 0300  WBC 15.3* 16.0*  HGB 10.6* 10.4*  HCT 31.8* 32.2*  MCV 88.6 91.5  PLT 150 136*     Radiology/Studies:  No new data  Physical Exam: Blood pressure 122/81, pulse 89, temperature 98.1 F (36.7 C), temperature source Oral, resp. rate 25, height 5\' 11"  (1.803 m), weight 233 lb 11 oz (106 kg), SpO2 96 %. Weight change: -3 lb 4.9 oz (-1.5 kg)  Wt Readings from Last 3 Encounters:  02/18/14 233 lb 11 oz (106 kg)  04/30/13 224 lb 6.4 oz (101.787 kg)  04/03/12 211 lb (95.709 kg)    Bloated / distended abdomen. Cardiac with rapid IIRR Peripheral edema.  ASSESSMENT:  1. Atrial fibrillation with RVR at ~ 105 bpm  2. Abdominal distension and diarrhea and now known positive C Dif.  3. Anticoagulate with coumadin  Plan:  Continue AMIO load.  Diuresis as tolerated.  Selinda EonSigned, Arnella Pralle III,Zeb Rawl  W 02/18/2014, 11:00 AM

## 2014-02-18 NOTE — Progress Notes (Signed)
4 Days Post-Op Procedure(s) (LRB): CORONARY ARTERY BYPASS GRAFTING (CABG)X4 LIMA-LAD; SVG-RCA(ENDARDERECTOMY); SVG-OM; SVG-DIAG (N/A) TRANSESOPHAGEAL ECHOCARDIOGRAM (TEE) (N/A) Subjective:  Developed diarrhea overnight with multiple loose stools and C. Diff pcr is positive. Placed on isolation and oral vancomycin started today.   Has also had nausea overnight and vomited.   Did not sleep much  Objective: Vital signs in last 24 hours: Temp:  [97.9 F (36.6 C)-99.3 F (37.4 C)] 98.1 F (36.7 C) (02/15 0800) Pulse Rate:  [61-136] 103 (02/15 1300) Cardiac Rhythm:  [-] Atrial fibrillation (02/15 1200) Resp:  [15-29] 20 (02/15 1300) BP: (90-138)/(54-103) 117/75 mmHg (02/15 1300) SpO2:  [76 %-98 %] 97 % (02/15 1300) Weight:  [106 kg (233 lb 11 oz)] 106 kg (233 lb 11 oz) (02/15 0312)  Hemodynamic parameters for last 24 hours:    Intake/Output from previous day: 02/14 0701 - 02/15 0700 In: 1776.6 [P.O.:940; I.V.:786.6; IV Piggyback:50] Out: 3270 [Urine:3170; Emesis/NG output:100] Intake/Output this shift: Total I/O In: 160.2 [I.V.:160.2] Out: 445 [Urine:445]  General appearance: alert and cooperative Heart: irregularly irregular rhythm Lungs: clear to auscultation bilaterally Abdomen: soft, non-tender; mildly distended, bowel sounds normal; no masses,  no organomegaly Extremities: edema mild Wound: incisions ok  Lab Results:  Recent Labs  02/15/14 1702 02/16/14 0300  WBC 15.3* 16.0*  HGB 10.6* 10.4*  HCT 31.8* 32.2*  PLT 150 136*   BMET:  Recent Labs  02/17/14 0430 02/18/14 0242  NA 135 135  K 3.9 4.0  CL 102 98  CO2 26 31  GLUCOSE 104* 118*  BUN 19 25*  CREATININE 1.66* 1.73*  CALCIUM 8.4 8.4    PT/INR:  Recent Labs  02/18/14 0242  LABPROT 14.5  INR 1.12   ABG    Component Value Date/Time   PHART 7.356 02/15/2014 0402   HCO3 22.4 02/15/2014 0402   TCO2 24 02/15/2014 0402   ACIDBASEDEF 3.0* 02/15/2014 0402   O2SAT 94.0 02/15/2014 0402   CBG  (last 3)   Recent Labs  02/18/14 0322 02/18/14 1138 02/18/14 1300  GLUCAP 106* 136* 94    Assessment/Plan: S/P Procedure(s) (LRB): CORONARY ARTERY BYPASS GRAFTING (CABG)X4 LIMA-LAD; SVG-RCA(ENDARDERECTOMY); SVG-OM; SVG-DIAG (N/A) TRANSESOPHAGEAL ECHOCARDIOGRAM (TEE) (N/A)  He is hemodynamically stable with low normal BP.  He remains in atrial fibrillation with uncontrolled rate 100-120. On IV amio and I gave him 0.5 mg digoxin last night. He has a preop history of atrial fibrillation and was on amio, cardizem and lopressor preop. I don't think his BP is going to tolerate cardizem at this time. Will give him 0.25 mg digoxin now and check level in the am. He has stage 3 CKD which complicates using digoxin.  Coumadin started: He was on 5 mg daily previously.  C. Diff colitis. Continue oral vancomycin for 14 days.   DM: under good control.  I think he is probably euvolemic. His weight is 7 lbs under preop wt.  Continue mobilization and IS.   LOS: 14 days    Laurine Kuyper K 02/18/2014

## 2014-02-18 NOTE — Progress Notes (Signed)
CRITICAL VALUE ALERT  Critical value received:  Positive  C. Difficile PCR  Date of notification:  02/18/2014  Time of notification:  0944  Critical value read back:Yes.    Nurse who received alert:  Wilmon ArmsLaura Nairobi Gustafson, RN  MD notified (1st page):  Gershon CraneWayne Gold, [place]  Time of first page:  1044  Responding MD:  Gershon CraneWayne Gold, PA  Time MD responded:  [phone]

## 2014-02-18 NOTE — Progress Notes (Signed)
Patient ID: [name], male   DOB: Jan 30, 1940, 74 y.o.   MRN: [id number] EVENING ROUNDS NOTE :     [address]       [place] [id number]             [phone]                 4 Days Post-Op Procedure(s) (LRB): CORONARY ARTERY BYPASS GRAFTING (CABG)X4 LIMA-LAD; SVG-RCA(ENDARDERECTOMY); SVG-OM; SVG-DIAG (N/A) TRANSESOPHAGEAL ECHOCARDIOGRAM (TEE) (N/A)  Total Length of Stay:  LOS: 14 days  BP 112/75 mmHg  Pulse 101  Temp(Src) 98.8 F (37.1 C) (Oral)  Resp 19  Ht 5\' 11"  (1.803 m)  Wt 233 lb 11 oz (106 kg)  BMI 32.61 kg/m2  SpO2 95%  .Intake/Output      02/14 0701 - 02/15 0700 02/15 0701 - 02/16 0700   P.O. 940    I.V. (mL/kg) 786.6 (7.4) 267 (2.5)   IV Piggyback 50    Total Intake(mL/kg) 1776.6 (16.8) 267 (2.5)   Urine (mL/kg/hr) 3170 (1.2) 670 (0.6)   Emesis/NG output 100 (0)    Stool 0 (0) 0 (0)   Chest Tube     Total Output 3270 670   Net -1493.4 -403        Urine Occurrence  1 x   Stool Occurrence 6 x 1 x   Emesis Occurrence 1 x      . sodium chloride 20 mL/hr at 02/15/14 0300  . sodium chloride    . sodium chloride Stopped (02/14/14 1700)  . amiodarone 30 mg/hr (02/18/14 1720)  . lactated ringers 10 mL/hr at 02/16/14 1800     Lab Results  Component Value Date   WBC 16.0* 02/16/2014   HGB 10.4* 02/16/2014   HCT 32.2* 02/16/2014   PLT 136* 02/16/2014   GLUCOSE 118* 02/18/2014   CHOL 111 02/13/2014   TRIG 92 02/13/2014   HDL 34* 02/13/2014   LDLCALC 59 02/13/2014   ALT 59* 02/05/2014   AST 72* 02/05/2014   NA 135 02/18/2014   K 4.0 02/18/2014   CL 98 02/18/2014   CREATININE 1.73* 02/18/2014   BUN 25* 02/18/2014   CO2 31 02/18/2014   TSH 2.884 02/04/2014   INR 1.12 02/18/2014   HGBA1C 5.8* 02/13/2014   Remains in afib on Cordarone   Drip Abdomen feels  better    Delight OvensEdward B Riti Rollyson MD  Beeper [phone] Office [phone] 02/18/2014 5:27 PM

## 2014-02-19 ENCOUNTER — Inpatient Hospital Stay (HOSPITAL_COMMUNITY): Payer: Medicare Other

## 2014-02-19 DIAGNOSIS — N289 Disorder of kidney and ureter, unspecified: Secondary | ICD-10-CM

## 2014-02-19 LAB — CBC
HCT: 26.9 % — ABNORMAL LOW (ref 39.0–52.0)
Hemoglobin: 8.9 g/dL — ABNORMAL LOW (ref 13.0–17.0)
MCH: 29.6 pg (ref 26.0–34.0)
MCHC: 33.1 g/dL (ref 30.0–36.0)
MCV: 89.4 fL (ref 78.0–100.0)
Platelets: 283 10*3/uL (ref 150–400)
RBC: 3.01 MIL/uL — ABNORMAL LOW (ref 4.22–5.81)
RDW: 14.8 % (ref 11.5–15.5)
WBC: 13.7 10*3/uL — ABNORMAL HIGH (ref 4.0–10.5)

## 2014-02-19 LAB — DIGOXIN LEVEL: Digoxin Level: 0.8 ng/mL (ref 0.8–2.0)

## 2014-02-19 LAB — GLUCOSE, CAPILLARY
Glucose-Capillary: 118 mg/dL — ABNORMAL HIGH (ref 70–99)
Glucose-Capillary: 120 mg/dL — ABNORMAL HIGH (ref 70–99)
Glucose-Capillary: 126 mg/dL — ABNORMAL HIGH (ref 70–99)
Glucose-Capillary: 111 mg/dL — ABNORMAL HIGH (ref 70–99)

## 2014-02-19 LAB — BASIC METABOLIC PANEL
Anion gap: 12 (ref 5–15)
BUN: 25 mg/dL — ABNORMAL HIGH (ref 6–23)
CO2: 26 mmol/L (ref 19–32)
Calcium: 8.4 mg/dL (ref 8.4–10.5)
Chloride: 99 mmol/L (ref 96–112)
Creatinine, Ser: 1.54 mg/dL — ABNORMAL HIGH (ref 0.50–1.35)
GFR calc Af Amer: 50 mL/min — ABNORMAL LOW (ref >90)
GFR calc non Af Amer: 43 mL/min — ABNORMAL LOW (ref >90)
Glucose, Bld: 113 mg/dL — ABNORMAL HIGH (ref 70–99)
Potassium: 3.2 mmol/L — ABNORMAL LOW (ref 3.5–5.1)
Sodium: 137 mmol/L (ref 135–145)

## 2014-02-19 LAB — PROTIME-INR
INR: 1.26 (ref 0.00–1.49)
Prothrombin Time: 15.9 seconds — ABNORMAL HIGH (ref 11.6–15.2)

## 2014-02-19 MED ORDER — POTASSIUM CHLORIDE 10 MEQ/50ML IV SOLN
10.0000 meq | INTRAVENOUS | Status: AC | PRN
  Administered 2014-02-19 (×3): 10 meq via INTRAVENOUS
  Filled 2014-02-19 (×3): qty 50

## 2014-02-19 MED ORDER — SIMETHICONE 80 MG PO CHEW
80.0000 mg | CHEWABLE_TABLET | Freq: Four times a day (QID) | ORAL | Status: DC
  Administered 2014-02-19 – 2014-03-05 (×55): 80 mg via ORAL
  Filled 2014-02-19 (×60): qty 1

## 2014-02-19 MED ORDER — COLCHICINE 0.6 MG PO TABS
0.6000 mg | ORAL_TABLET | Freq: Every day | ORAL | Status: DC
  Administered 2014-02-19 – 2014-03-05 (×15): 0.6 mg via ORAL
  Filled 2014-02-19 (×15): qty 1

## 2014-02-19 MED ORDER — CLOPIDOGREL BISULFATE 75 MG PO TABS
75.0000 mg | ORAL_TABLET | Freq: Every day | ORAL | Status: DC
  Administered 2014-02-19 – 2014-03-05 (×14): 75 mg via ORAL
  Filled 2014-02-19 (×15): qty 1

## 2014-02-19 MED ORDER — DILTIAZEM HCL 100 MG IV SOLR
5.0000 mg/h | INTRAVENOUS | Status: DC
  Administered 2014-02-19: 5 mg/h via INTRAVENOUS
  Administered 2014-02-19 – 2014-02-20 (×2): 10 mg/h via INTRAVENOUS
  Filled 2014-02-19: qty 100

## 2014-02-19 MED ORDER — ALLOPURINOL 150 MG HALF TABLET
150.0000 mg | ORAL_TABLET | Freq: Every day | ORAL | Status: DC
  Administered 2014-02-19 – 2014-03-05 (×15): 150 mg via ORAL
  Filled 2014-02-19 (×15): qty 1

## 2014-02-19 MED ORDER — SODIUM CHLORIDE 0.9 % IJ SOLN
10.0000 mL | INTRAMUSCULAR | Status: DC | PRN
  Administered 2014-02-23: 20 mL
  Administered 2014-02-23 – 2014-02-24 (×5): 10 mL
  Administered 2014-02-25 – 2014-02-26 (×2): 20 mL
  Administered 2014-03-02 – 2014-03-04 (×5): 10 mL
  Filled 2014-02-19 (×13): qty 40

## 2014-02-19 MED ORDER — ENOXAPARIN SODIUM 40 MG/0.4ML ~~LOC~~ SOLN
40.0000 mg | SUBCUTANEOUS | Status: DC
  Filled 2014-02-19 (×2): qty 0.4

## 2014-02-19 MED ORDER — SODIUM CHLORIDE 0.9 % IJ SOLN
10.0000 mL | Freq: Two times a day (BID) | INTRAMUSCULAR | Status: DC
  Administered 2014-02-19 – 2014-03-01 (×9): 10 mL

## 2014-02-19 NOTE — Plan of Care (Addendum)
Problem: Phase II - Intermediate Post-Op Goal: Advance Diet Outcome: Not Progressing Pt with some on-going nausea. Changed from full liquid to heart healthy diet.    Goal: Activity Progressed Outcome: progressing.  Ambulated 200 ft earlier this am.

## 2014-02-19 NOTE — Plan of Care (Signed)
Problem: Phase II - Intermediate Post-Op Goal: Activity Progressed Outcome: Progressing Ambulated 200 ft this am and will attempt again this afternoon. Patient is possibly having a gout flare up in right leg and shoulder is also hurting. Dr. Maren BeachVanTrigt has adjusted his allopurinol and started cholcechine.

## 2014-02-19 NOTE — Progress Notes (Signed)
Patient Name: Loraine MapleRalph D Hults Date of Encounter: 02/19/2014     Principal Problem:   Coronary atherosclerosis of native coronary artery Active Problems:   Paroxysmal atrial fibrillation   Hypertension   Renal insufficiency   Atrial fibrillation with RVR   Unstable angina   S/P CABG x 4    SUBJECTIVE  NO CP or SOB. GEtting PICC line placed.   CURRENT MEDS . allopurinol  150 mg Oral Daily  . antiseptic oral rinse  7 mL Mouth Rinse QID  . aspirin EC  81 mg Oral Daily  . atorvastatin  40 mg Oral q1800  . bisacodyl  10 mg Oral Daily   Or  . bisacodyl  10 mg Rectal Daily  . clopidogrel  75 mg Oral Daily  . colchicine  0.6 mg Oral Daily  . docusate sodium  200 mg Oral Daily  . insulin aspart  0-24 Units Subcutaneous TID WC  . insulin glargine  12 Units Subcutaneous BID  . metoprolol tartrate  25 mg Oral BID  . mupirocin ointment  1 application Nasal BID  . pantoprazole  40 mg Oral Daily  . simethicone  80 mg Oral QID  . sodium chloride  3 mL Intravenous Q12H  . vancomycin  125 mg Oral QID  . warfarin  5 mg Oral q1800  . Warfarin - Physician Dosing Inpatient   Does not apply q1800    OBJECTIVE  Filed Vitals:   02/19/14 0900 02/19/14 1000 02/19/14 1047 02/19/14 1100  BP: 120/73 122/67 122/67 112/65  Pulse: 108 103 111 124  Temp:      TempSrc:      Resp: 20 16  20   Height:      Weight:      SpO2: 96% 95%  97%    Intake/Output Summary (Last 24 hours) at 02/19/14 1129 Last data filed at 02/19/14 1100  Gross per 24 hour  Intake 944.97 ml  Output    505 ml  Net 439.97 ml   Filed Weights   02/17/14 0600 02/18/14 0312 02/19/14 0500  Weight: 236 lb 15.9 oz (107.5 kg) 233 lb 11 oz (106 kg) 231 lb 1.6 oz (104.826 kg)    PHYSICAL EXAM  General: Pleasant, NAD. Neuro: Alert and oriented X 3. Moves all extremities spontaneously. Psych: Normal affect. HEENT:  Normal  Neck: Supple without bruits or JVD. Lungs:  Resp regular and unlabored, CTA. Heart: irreg irreg.  no s3, s4, or murmurs. Abdomen: Soft, non-tender, distended, BS + x 4.  Extremities: No clubbing, cyanosis or edema. DP/PT/Radials 2+ and equal bilaterally.  Accessory Clinical Findings  CBC  Recent Labs  02/19/14 0227  WBC 13.7*  HGB 8.9*  HCT 26.9*  MCV 89.4  PLT 283   Basic Metabolic Panel  Recent Labs  02/18/14 0242 02/19/14 0227  NA 135 137  K 4.0 3.2*  CL 98 99  CO2 31 26  GLUCOSE 118* 113*  BUN 25* 25*  CREATININE 1.73* 1.54*  CALCIUM 8.4 8.4     TELE afib with HR in 100s-110s.   Radiology/Studies  Dg Chest 2 View  02/13/2014   CLINICAL DATA:  Preop for Coronary artery bypass graft.  EXAM: CHEST  2 VIEW  COMPARISON:  February 06, 2014.  FINDINGS: The heart size and mediastinal contours are within normal limits. No pneumothorax or pleural effusion is noted. Stable bibasilar interstitial densities are noted most consistent with subsegmental atelectasis or scar. No acute pulmonary disease is noted. Anterior osteophyte formation is  noted in the lower thoracic spine.  IMPRESSION: Stable bibasilar subsegmental atelectasis or scarring.   Electronically Signed   By: Lupita Raider, M.D.   On: 02/13/2014 17:23   Dg Chest 2 View  02/06/2014   CLINICAL DATA:  Shortness of breath. Atrial fibrillation. Initial encounter.  EXAM: CHEST  2 VIEW  COMPARISON:  02/03/2014 radiographs.  FINDINGS: 0922 hr. The heart size and mediastinal contours are stable. There is stable linear bibasilar atelectasis or scarring. No edema, confluent airspace opacity or significant pleural effusion demonstrated. The bones appear unchanged.  IMPRESSION: Stable linear bibasilar atelectasis or scarring. No evidence of edema or acute process.   Electronically Signed   By: Roxy Horseman M.D.   On: 02/06/2014 10:10   Ct Chest Wo Contrast  02/10/2014   CLINICAL DATA:  Chest pain, preop CABG  EXAM: CT CHEST WITHOUT CONTRAST  TECHNIQUE: Multidetector CT imaging of the chest was performed following the standard  protocol without IV contrast.  COMPARISON:  Chest radiographs dated 02/06/2014  FINDINGS: Mediastinum/Nodes: The heart is normal in size. No pericardial effusion.  Coronary atherosclerosis in the left main coronary artery and LAD.  Mild atherosclerotic calcifications of the aortic arch.  No suspicious mediastinal or axillary lymphadenopathy.  Visualized thyroid is unremarkable.  Lungs/Pleura: Evaluation of the lung parenchyma is constrained by respiratory motion.  No suspicious pulmonary nodules, noting motion degradation.  Atelectasis/scarring in the bilateral lower lobes. Suspected emphysematous changes.  No pleural effusion or pneumothorax.  Upper abdomen: Visualized upper abdomen is notable for a stable 2.0 x 1.5 cm left adrenal adenoma (series 201/image 58), multiple nonobstructing bilateral renal calculi measuring up to 5 mm, and two dominant left renal cysts measuring up to 2.3 cm.  Musculoskeletal: Degenerative changes of the visualized thoracolumbar spine.  IMPRESSION: No evidence of acute cardiopulmonary disease.  Coronary atherosclerosis in the left main coronary artery and LAD.  Ancillary findings as above.   Electronically Signed   By: Charline Bills M.D.   On: 02/10/2014 13:44   Dg Chest Port 1 View  02/19/2014   CLINICAL DATA:  CABG.  EXAM: PORTABLE CHEST - 1 VIEW  COMPARISON:  02/17/2014.  FINDINGS: Left IJ sheath in good anatomic position. Mediastinum and hilar structures are normal. Prior CABG. Cardiomegaly with normal pulmonary vascularity. Bibasilar atelectasis with small left pleural effusion again noted. No acute bony abnormality identified.  IMPRESSION: 1. Left IJ sheath in stable position. 2. Bibasilar atelectasis and small left pleural effusion. No interim change. 3. Cardiomegaly with normal pulmonary vascularity.  Prior CABG.   Electronically Signed   By: Maisie Fus  Register   On: 02/19/2014 07:20   Dg Chest Port 1 View  02/17/2014   CLINICAL DATA:  Status post CABG.  EXAM: PORTABLE  CHEST - 1 VIEW  COMPARISON:  02/16/2014  FINDINGS: Left IJ central line sheath remains in place. Chest tube has been removed. Status post median sternotomy and CABG.  The heart is enlarged. There is opacity at the left lung base consistent with atelectasis and pleural effusion. No pneumothorax following removal of chest tube.  IMPRESSION: 1. Status post removal of left-sided chest tube.  No pneumothorax. 2. Cardiomegaly and left lower lobe atelectasis or infiltrate, pleural effusion.   Electronically Signed   By: Norva Pavlov M.D.   On: 02/17/2014 07:36   Dg Chest Port 1 View  02/16/2014   CLINICAL DATA:  Status post CABG  EXAM: PORTABLE CHEST - 1 VIEW  COMPARISON:  02/15/2014  FINDINGS: Interval removal of  Swan-Ganz catheter. The sheath has likely been clamped in the neck. Left-sided thoracic drain is in stable position.  There is no definite or enlarging left pneumothorax. Pulmonary venous congestion appears decreased.  Unchanged cardiomegaly and upper mediastinal widening post CABG. Bibasilar atelectasis, greater on the left, and left pleural effusion is unchanged.  IMPRESSION: 1. No definite or increasing left pneumothorax. 2. Unchanged atelectasis and small pleural effusion on the left.   Electronically Signed   By: Marnee Spring M.D.   On: 02/16/2014 07:11   Dg Chest Port 1 View  02/15/2014   CLINICAL DATA:  Status post Coronary artery bypass graft x4.  EXAM: PORTABLE CHEST - 1 VIEW  COMPARISON:  February 14, 2014.  FINDINGS: Stable cardiomediastinal silhouette. Endotracheal tube has been removed. Left internal jugular Swan-Ganz catheter is noted with tip directed toward right pulmonary artery. Left-sided chest tube is noted with minimal left apical pneumothorax. Right lung is clear. Stable left basilar opacity is noted most consistent with atelectasis with associated pleural effusion.  IMPRESSION: Stable left basilar subsegmental atelectasis with associated pleural effusion. Endotracheal tube has  been removed. Left-sided chest tube is again noted with minimal left apical pneumothorax now seen.   Electronically Signed   By: Lupita Raider, M.D.   On: 02/15/2014 07:56   Dg Chest Portable 1 View  02/14/2014   CLINICAL DATA:  Hypoxia status post coronary artery bypass graft.  EXAM: PORTABLE CHEST - 1 VIEW  COMPARISON:  February 13, 2014.  FINDINGS: Endotracheal tube is in grossly good position with distal tip 3.6 cm above the carina. Left internal jugular Swan-Ganz catheter is noted with distal tip directed toward right pulmonary artery. Left-sided chest tube is noted without pneumothorax. Minimal right basilar subsegmental atelectasis is noted. Left basilar opacity is noted concerning for atelectasis with associated pleural effusion.  IMPRESSION: No pneumothorax is seen status post coronary artery bypass graft. Left-sided chest tube is in grossly good position. Minimal right basilar subsegmental atelectasis is noted. Left lower lobe opacity is noted concerning for atelectasis with possible associated pleural effusion.   Electronically Signed   By: Lupita Raider, M.D.   On: 02/14/2014 14:22    ASSESSMENT AND PLAN  ANDRIK SANDT is a 74 y.o. male with a history of CAD treated medically, PAF on coumadin, HTN, CKD, remote PE who presented to [facility] on 02/04/14 with CP. He undwent LHC on 02/08/14 and was found to have severe 3 V CAD with mild LV dysfunction. He was referred for CABG x 4on 02/14/14. His course was further complicated by post op A-fib and he has been started on amiodarone load and restarted on coumadin.  CAD- s/p CABG x4. LIMA-LAD; SVG-RCA(ENDARDERECTOMY); SVG-OM; SVG-DIAG  -- Continue asa/plavix, bb and statin   PAF- afib with HR in 100s-110s.  -- Placed on cardizem gtt and continued on amio load.  -- Continue coumadin. INR subtheraputic. 1.26  Cdif- continue abx.   Hypokalemia- K 3.2, repleted this AM. Follow BMET   Signed, Janetta Hora PA-C  Pager [phone]

## 2014-02-19 NOTE — Progress Notes (Addendum)
5 Days Post-Op Procedure(s) (LRB): CORONARY ARTERY BYPASS GRAFTING (CABG)X4 LIMA-LAD; SVG-RCA(ENDARDERECTOMY); SVG-OM; SVG-DIAG (N/A) TRANSESOPHAGEAL ECHOCARDIOGRAM (TEE) (N/A) Subjective: Postop afib- add iv cardizem to iv amio c diff on oral vanco Stage 2 CKD stable Objective: Vital signs in last 24 hours: Temp:  [98.3 F (36.8 C)-98.8 F (37.1 C)] 98.3 F (36.8 C) (02/16 0343) Pulse Rate:  [89-120] 102 (02/16 0700) Cardiac Rhythm:  [-] Atrial fibrillation (02/16 0700) Resp:  [11-25] 18 (02/16 0700) BP: (89-128)/(58-103) 103/63 mmHg (02/16 0700) SpO2:  [92 %-100 %] 94 % (02/16 0700) Weight:  [231 lb 1.6 oz (104.826 kg)] 231 lb 1.6 oz (104.826 kg) (02/16 0500)  Hemodynamic parameters for last 24 hours:  stable BP  Intake/Output from previous day: 02/15 0701 - 02/16 0700 In: 710.8 [I.V.:610.8; IV Piggyback:100] Out: 820 [Urine:820] Intake/Output this shift:    Lungs clear abd soft, distended mildly  Lab Results:  Recent Labs  02/19/14 0227  WBC 13.7*  HGB 8.9*  HCT 26.9*  PLT 283   BMET:  Recent Labs  02/18/14 0242 02/19/14 0227  NA 135 137  K 4.0 3.2*  CL 98 99  CO2 31 26  GLUCOSE 118* 113*  BUN 25* 25*  CREATININE 1.73* 1.54*  CALCIUM 8.4 8.4    PT/INR:  Recent Labs  02/19/14 0227  LABPROT 15.9*  INR 1.26   ABG    Component Value Date/Time   PHART 7.356 02/15/2014 0402   HCO3 22.4 02/15/2014 0402   TCO2 24 02/15/2014 0402   ACIDBASEDEF 3.0* 02/15/2014 0402   O2SAT 94.0 02/15/2014 0402   CBG (last 3)   Recent Labs  02/18/14 1300 02/18/14 1547 02/18/14 2022  GLUCAP 94 108* 106*    Assessment/Plan: S/P Procedure(s) (LRB): CORONARY ARTERY BYPASS GRAFTING (CABG)X4 LIMA-LAD; SVG-RCA(ENDARDERECTOMY); SVG-OM; SVG-DIAG (N/A) TRANSESOPHAGEAL ECHOCARDIOGRAM (TEE) (N/A) picc line Leave in ICU for chemical cardioversion of postop afib Add plavix  To ASA 81 for coronary endarterectomy  LOS: 15 days    VAN TRIGT  III,PETER 02/19/2014

## 2014-02-19 NOTE — Progress Notes (Signed)
[facility] BRIEF SICU PROGRESS NOTE  5 Days Post-Op  S/P Procedure(s) (LRB): CORONARY ARTERY BYPASS GRAFTING (CABG)X4 LIMA-LAD; SVG-RCA(ENDARDERECTOMY); SVG-OM; SVG-DIAG (N/A) TRANSESOPHAGEAL ECHOCARDIOGRAM (TEE) (N/A)   Overall stable day Remains in Afib w/ controlled rate BP stable on Cardizem drip O2 sats 96% on 5 L/min Adequate UOP Continues to have diarrhea but reportedly improved  Plan: Continue current plan  Gregory Davila H 02/19/2014 4:52 PM

## 2014-02-19 NOTE — Plan of Care (Signed)
Problem: Phase I - Pre-Op Goal: Point person for discharge identified Outcome: Completed/Met Date Met:  02/16/14 Wife will be able to stay with patient 24/7 upon discharge. Sister and another family member will be able to assist.  Problem: Phase II - Intermediate Post-Op Goal: Advance Diet Outcome: Progressing Patient still requesting full liquid diet and having ocassional nausea.     

## 2014-02-20 LAB — GLUCOSE, CAPILLARY
Glucose-Capillary: 130 mg/dL — ABNORMAL HIGH (ref 70–99)
Glucose-Capillary: 112 mg/dL — ABNORMAL HIGH (ref 70–99)
Glucose-Capillary: 118 mg/dL — ABNORMAL HIGH (ref 70–99)
Glucose-Capillary: 94 mg/dL (ref 70–99)

## 2014-02-20 LAB — CBC
HCT: 26.3 % — ABNORMAL LOW (ref 39.0–52.0)
Hemoglobin: 8.6 g/dL — ABNORMAL LOW (ref 13.0–17.0)
MCH: 29.7 pg (ref 26.0–34.0)
MCHC: 32.7 g/dL (ref 30.0–36.0)
MCV: 90.7 fL (ref 78.0–100.0)
Platelets: 314 10*3/uL (ref 150–400)
RBC: 2.9 MIL/uL — ABNORMAL LOW (ref 4.22–5.81)
RDW: 14.9 % (ref 11.5–15.5)
WBC: 12.9 10*3/uL — ABNORMAL HIGH (ref 4.0–10.5)

## 2014-02-20 LAB — BASIC METABOLIC PANEL
Anion gap: 4 — ABNORMAL LOW (ref 5–15)
BUN: 22 mg/dL (ref 6–23)
CO2: 31 mmol/L (ref 19–32)
Calcium: 8.1 mg/dL — ABNORMAL LOW (ref 8.4–10.5)
Chloride: 99 mmol/L (ref 96–112)
Creatinine, Ser: 1.47 mg/dL — ABNORMAL HIGH (ref 0.50–1.35)
GFR calc Af Amer: 53 mL/min — ABNORMAL LOW (ref >90)
GFR calc non Af Amer: 46 mL/min — ABNORMAL LOW (ref >90)
Glucose, Bld: 177 mg/dL — ABNORMAL HIGH (ref 70–99)
Potassium: 3.2 mmol/L — ABNORMAL LOW (ref 3.5–5.1)
Sodium: 134 mmol/L — ABNORMAL LOW (ref 135–145)

## 2014-02-20 LAB — PROTIME-INR
INR: 1.28 (ref 0.00–1.49)
Prothrombin Time: 16.1 seconds — ABNORMAL HIGH (ref 11.6–15.2)

## 2014-02-20 MED ORDER — DILTIAZEM HCL ER COATED BEADS 300 MG PO CP24
300.0000 mg | ORAL_CAPSULE | Freq: Every day | ORAL | Status: DC
  Filled 2014-02-20: qty 1

## 2014-02-20 MED ORDER — AMIODARONE HCL 200 MG PO TABS
200.0000 mg | ORAL_TABLET | Freq: Every day | ORAL | Status: DC
  Administered 2014-02-20: 200 mg via ORAL
  Filled 2014-02-20 (×2): qty 1

## 2014-02-20 MED ORDER — METOPROLOL TARTRATE 12.5 MG HALF TABLET
12.5000 mg | ORAL_TABLET | Freq: Two times a day (BID) | ORAL | Status: DC
  Administered 2014-02-20 (×2): 12.5 mg via ORAL
  Filled 2014-02-20 (×3): qty 1

## 2014-02-20 MED ORDER — FUROSEMIDE 10 MG/ML IJ SOLN
40.0000 mg | Freq: Every day | INTRAMUSCULAR | Status: DC
  Administered 2014-02-20: 40 mg via INTRAVENOUS
  Filled 2014-02-20 (×2): qty 4

## 2014-02-20 MED ORDER — POTASSIUM CHLORIDE 10 MEQ/50ML IV SOLN
10.0000 meq | INTRAVENOUS | Status: AC
  Administered 2014-02-20 (×3): 10 meq via INTRAVENOUS
  Filled 2014-02-20 (×3): qty 50

## 2014-02-20 MED ORDER — DILTIAZEM HCL ER COATED BEADS 240 MG PO CP24
240.0000 mg | ORAL_CAPSULE | Freq: Every day | ORAL | Status: DC
  Administered 2014-02-20: 240 mg via ORAL
  Filled 2014-02-20: qty 1

## 2014-02-20 MED ORDER — DILTIAZEM HCL 100 MG IV SOLR
5.0000 mg/h | INTRAVENOUS | Status: AC
  Administered 2014-02-20: 5 mg/h via INTRAVENOUS
  Filled 2014-02-20: qty 100

## 2014-02-20 NOTE — Progress Notes (Signed)
EKG CRITICAL VALUE     12 lead EKG performed.  Critical value noted. Domenica Failebekah Teague, RN notified.   Ceaser Ebeling L, CCT 02/20/2014 10:23 AM

## 2014-02-20 NOTE — Progress Notes (Signed)
6 Days Post-Op Procedure(s) (LRB): CORONARY ARTERY BYPASS GRAFTING (CABG)X4 LIMA-LAD; SVG-RCA(ENDARDERECTOMY); SVG-OM; SVG-DIAG (N/A) TRANSESOPHAGEAL ECHOCARDIOGRAM (TEE) (N/A) Subjective: Postop CABG, Afib resolved w/ cardizem drip C diff colitis better Renal fx improved Objective: Vital signs in last 24 hours: Temp:  [97.2 F (36.2 C)-99.2 F (37.3 C)] 97.2 F (36.2 C) (02/17 0815) Pulse Rate:  [64-124] 98 (02/17 0700) Cardiac Rhythm:  [-] Normal sinus rhythm (02/16 2000) Resp:  [14-24] 18 (02/17 0700) BP: (84-130)/(52-78) 121/72 mmHg (02/17 0700) SpO2:  [91 %-97 %] 97 % (02/17 0700) Weight:  [230 lb 2.6 oz (104.4 kg)] 230 lb 2.6 oz (104.4 kg) (02/17 0500)  Hemodynamic parameters for last 24 hours:  stable  Intake/Output from previous day: 02/16 0701 - 02/17 0700 In: 1065.8 [P.O.:430; I.V.:585.8; IV Piggyback:50] Out: 225 [Urine:225] Intake/Output this shift:    Mild edema Incisions clean  Lab Results:  Recent Labs  02/19/14 0227 02/20/14 0500  WBC 13.7* 12.9*  HGB 8.9* 8.6*  HCT 26.9* 26.3*  PLT 283 314   BMET:  Recent Labs  02/19/14 0227 02/20/14 0500  NA 137 134*  K 3.2* 3.2*  CL 99 99  CO2 26 31  GLUCOSE 113* 177*  BUN 25* 22  CREATININE 1.54* 1.47*  CALCIUM 8.4 8.1*    PT/INR:  Recent Labs  02/20/14 0500  LABPROT 16.1*  INR 1.28   ABG    Component Value Date/Time   PHART 7.356 02/15/2014 0402   HCO3 22.4 02/15/2014 0402   TCO2 24 02/15/2014 0402   ACIDBASEDEF 3.0* 02/15/2014 0402   O2SAT 94.0 02/15/2014 0402   CBG (last 3)   Recent Labs  02/19/14 1628 02/19/14 2145 02/20/14 0812  GLUCAP 126* 111* 130*    Assessment/Plan: S/P Procedure(s) (LRB): CORONARY ARTERY BYPASS GRAFTING (CABG)X4 LIMA-LAD; SVG-RCA(ENDARDERECTOMY); SVG-OM; SVG-DIAG (N/A) TRANSESOPHAGEAL ECHOCARDIOGRAM (TEE) (N/A) Continue coumadin for hx chronic afib 30 days plavix for coronary endarterectomy Wean off iv cardizem   LOS: 16 days    VAN TRIGT  III,PETER 02/20/2014

## 2014-02-20 NOTE — Progress Notes (Signed)
       Patient Name: Gregory Davila Date of Encounter: 02/20/2014    SUBJECTIVE:Feels better. Bowel movements are decreasing.  TELEMETRY:  A fib with better rate control. Filed Vitals:   02/20/14 0700 02/20/14 0800 02/20/14 0815 02/20/14 0900  BP: 121/72 115/68  115/69  Pulse: 98 88  99  Temp:   97.2 F (36.2 C)   TempSrc:   Oral   Resp: 18 17  15   Height:      Weight:      SpO2: 97% 96%  95%    Intake/Output Summary (Last 24 hours) at 02/20/14 0951 Last data filed at 02/20/14 0900  Gross per 24 hour  Intake 957.47 ml  Output    225 ml  Net 732.47 ml   LABS: Basic Metabolic Panel:  Recent Labs  09/81/1902/16/16 0227 02/20/14 0500  NA 137 134*  K 3.2* 3.2*  CL 99 99  CO2 26 31  GLUCOSE 113* 177*  BUN 25* 22  CREATININE 1.54* 1.47*  CALCIUM 8.4 8.1*   CBC:  Recent Labs  02/19/14 0227 02/20/14 0500  WBC 13.7* 12.9*  HGB 8.9* 8.6*  HCT 26.9* 26.3*  MCV 89.4 90.7  PLT 283 314     Radiology/Studies:  No new data  Physical Exam: Blood pressure 115/69, pulse 99, temperature 97.2 F (36.2 C), temperature source Oral, resp. rate 15, height 5\' 11"  (1.803 m), weight 230 lb 2.6 oz (104.4 kg), SpO2 95 %. Weight change: -15 oz (-0.426 kg)  Wt Readings from Last 3 Encounters:  02/20/14 230 lb 2.6 oz (104.4 kg)  04/30/13 224 lb 6.4 oz (101.787 kg)  04/03/12 211 lb (95.709 kg)    Chest is clear Cor without rub  ASSESSMENT:  1. Atrial fib with moderate rate control 2. CAD s/p CABG  Plan:  1. Continue amio orally 2. Wean diltaizem to oral.  Signed, Gregory Davila,Gregory Davila 02/20/2014, 9:51 AM

## 2014-02-20 NOTE — Progress Notes (Signed)
CT surgery p.m. Rounds  Patient resting comfortably Back in atrial fibrillation heart rate 110 Amiodarone has not been effective Resuming preoperative Cardizem, continuing with Coumadin Prolonged Q-T interval, repeat ECG in a.m.

## 2014-02-21 ENCOUNTER — Inpatient Hospital Stay (HOSPITAL_COMMUNITY): Payer: Medicare Other

## 2014-02-21 LAB — GLUCOSE, CAPILLARY
Glucose-Capillary: 99 mg/dL (ref 70–99)
Glucose-Capillary: 122 mg/dL — ABNORMAL HIGH (ref 70–99)
Glucose-Capillary: 119 mg/dL — ABNORMAL HIGH (ref 70–99)
Glucose-Capillary: 113 mg/dL — ABNORMAL HIGH (ref 70–99)
Glucose-Capillary: 91 mg/dL (ref 70–99)

## 2014-02-21 LAB — BASIC METABOLIC PANEL
Anion gap: 5 (ref 5–15)
BUN: 22 mg/dL (ref 6–23)
CO2: 32 mmol/L (ref 19–32)
Calcium: 8.2 mg/dL — ABNORMAL LOW (ref 8.4–10.5)
Chloride: 101 mmol/L (ref 96–112)
Creatinine, Ser: 1.55 mg/dL — ABNORMAL HIGH (ref 0.50–1.35)
GFR calc Af Amer: 50 mL/min — ABNORMAL LOW (ref >90)
GFR calc non Af Amer: 43 mL/min — ABNORMAL LOW (ref >90)
Glucose, Bld: 107 mg/dL — ABNORMAL HIGH (ref 70–99)
Potassium: 3 mmol/L — ABNORMAL LOW (ref 3.5–5.1)
Sodium: 138 mmol/L (ref 135–145)

## 2014-02-21 LAB — CBC
HCT: 26.3 % — ABNORMAL LOW (ref 39.0–52.0)
Hemoglobin: 8.5 g/dL — ABNORMAL LOW (ref 13.0–17.0)
MCH: 29.3 pg (ref 26.0–34.0)
MCHC: 32.3 g/dL (ref 30.0–36.0)
MCV: 90.7 fL (ref 78.0–100.0)
Platelets: 353 10*3/uL (ref 150–400)
RBC: 2.9 MIL/uL — ABNORMAL LOW (ref 4.22–5.81)
RDW: 15.2 % (ref 11.5–15.5)
WBC: 15.3 10*3/uL — ABNORMAL HIGH (ref 4.0–10.5)

## 2014-02-21 LAB — PROTIME-INR
INR: 1.48 (ref 0.00–1.49)
Prothrombin Time: 18 seconds — ABNORMAL HIGH (ref 11.6–15.2)

## 2014-02-21 MED ORDER — POTASSIUM CHLORIDE 10 MEQ/50ML IV SOLN
10.0000 meq | INTRAVENOUS | Status: AC
  Administered 2014-02-21 (×2): 10 meq via INTRAVENOUS

## 2014-02-21 MED ORDER — FUROSEMIDE 40 MG PO TABS
40.0000 mg | ORAL_TABLET | Freq: Every day | ORAL | Status: DC
Start: 2014-02-21 — End: 2014-02-27
  Administered 2014-02-21 – 2014-02-26 (×6): 40 mg via ORAL
  Filled 2014-02-21 (×7): qty 1

## 2014-02-21 MED ORDER — AMIODARONE HCL 200 MG PO TABS
200.0000 mg | ORAL_TABLET | Freq: Every day | ORAL | Status: DC
  Administered 2014-02-21: 200 mg via ORAL
  Filled 2014-02-21 (×2): qty 1

## 2014-02-21 MED ORDER — DILTIAZEM HCL ER COATED BEADS 120 MG PO CP24
120.0000 mg | ORAL_CAPSULE | Freq: Every day | ORAL | Status: DC
  Administered 2014-02-21: 120 mg via ORAL
  Filled 2014-02-21 (×2): qty 1

## 2014-02-21 MED ORDER — INSULIN GLARGINE 100 UNIT/ML ~~LOC~~ SOLN
10.0000 [IU] | Freq: Two times a day (BID) | SUBCUTANEOUS | Status: DC
  Administered 2014-02-21 (×2): 10 [IU] via SUBCUTANEOUS
  Filled 2014-02-21 (×4): qty 0.1

## 2014-02-21 MED ORDER — POTASSIUM CHLORIDE 10 MEQ/50ML IV SOLN
10.0000 meq | INTRAVENOUS | Status: DC
  Administered 2014-02-21: 10 meq via INTRAVENOUS
  Filled 2014-02-21 (×2): qty 50

## 2014-02-21 MED ORDER — DILTIAZEM HCL 100 MG IV SOLR
3.0000 mg/h | INTRAVENOUS | Status: AC
  Administered 2014-02-21 (×2): 10 mg/h via INTRAVENOUS
  Filled 2014-02-21: qty 100

## 2014-02-21 MED ORDER — LACTULOSE 10 GM/15ML PO SOLN
20.0000 g | Freq: Once | ORAL | Status: DC
  Filled 2014-02-21: qty 30

## 2014-02-21 MED ORDER — METOPROLOL TARTRATE 25 MG PO TABS
25.0000 mg | ORAL_TABLET | Freq: Two times a day (BID) | ORAL | Status: DC
  Administered 2014-02-21 – 2014-02-26 (×11): 25 mg via ORAL
  Filled 2014-02-21 (×12): qty 1

## 2014-02-21 MED ORDER — POTASSIUM CHLORIDE ER 10 MEQ PO TBCR
20.0000 meq | EXTENDED_RELEASE_TABLET | Freq: Two times a day (BID) | ORAL | Status: DC
  Administered 2014-02-21 – 2014-02-24 (×7): 20 meq via ORAL
  Filled 2014-02-21 (×9): qty 2

## 2014-02-21 NOTE — Progress Notes (Addendum)
T[facility] DAILY ICU PROGRESS NOTE                   [address]            [place] [address]          [phone]   7 Days Post-Op Procedure(s) (LRB): CORONARY ARTERY BYPASS GRAFTING (CABG)X4 LIMA-LAD; SVG-RCA(ENDARDERECTOMY); SVG-OM; SVG-DIAG (N/A) TRANSESOPHAGEAL ECHOCARDIOGRAM (TEE) (N/A)  Total Length of Stay:  LOS: 17 days   Subjective: Patient with not much appetite. Occasional nausea but no abdominal pain or emesis.  Objective: Vital signs in last 24 hours: Temp:  [97.6 F (36.4 C)-98.8 F (37.1 C)] 98.2 F (36.8 C) (02/18 0723) Pulse Rate:  [71-129] 105 (02/18 0830) Cardiac Rhythm:  [-] Atrial flutter (02/18 0700) Resp:  [14-21] 20 (02/18 0830) BP: (86-127)/(51-78) 116/65 mmHg (02/18 0830) SpO2:  [92 %-98 %] 94 % (02/18 0830) FiO2 (%):  [5 %] 5 % (02/17 2000) Weight:  [229 lb 4.5 oz (104 kg)] 229 lb 4.5 oz (104 kg) (02/18 0500)  Filed Weights   02/19/14 0500 02/20/14 0500 02/21/14 0500  Weight: 231 lb 1.6 oz (104.826 kg) 230 lb 2.6 oz (104.4 kg) 229 lb 4.5 oz (104 kg)    Weight change: -14.1 oz (-0.4 kg)      Intake/Output from previous day: 02/17 0701 - 02/18 0700 In: 367.6 [I.V.:217.6; IV Piggyback:150] Out: 1600 [Urine:1600]  Intake/Output this shift: Total I/O In: 110 [I.V.:10; IV Piggyback:100] Out: -   Current Meds: Scheduled Meds: . allopurinol  150 mg Oral Daily  . antiseptic oral rinse  7 mL Mouth Rinse QID  . atorvastatin  40 mg Oral q1800  . bisacodyl  10 mg Oral Daily   Or  . bisacodyl  10 mg Rectal Daily  . clopidogrel  75 mg Oral Daily  . colchicine  0.6 mg Oral Daily  . diltiazem  120 mg Oral Daily  . docusate sodium  200 mg Oral Daily  . furosemide  40 mg Oral Daily  . insulin aspart  0-24 Units Subcutaneous TID WC  . insulin glargine  12 Units Subcutaneous BID  . metoprolol tartrate  25 mg Oral BID  . mupirocin ointment  1 application Nasal BID  . pantoprazole  40 mg Oral Daily  . potassium chloride  20 mEq  Oral BID  . potassium chloride  10 mEq Intravenous Q1 Hr x 2  . simethicone  80 mg Oral QID  . sodium chloride  10-40 mL Intracatheter Q12H  . sodium chloride  3 mL Intravenous Q12H  . vancomycin  125 mg Oral QID  . warfarin  5 mg Oral q1800  . Warfarin - Physician Dosing Inpatient   Does not apply q1800   Continuous Infusions: . sodium chloride 20 mL/hr at 02/15/14 0300  . sodium chloride    . sodium chloride 10 mL/hr at 02/20/14 2200  . diltiazem (CARDIZEM) infusion 10 mg/hr (02/21/14 0826)  . lactated ringers 10 mL/hr at 02/16/14 1800   PRN Meds:.albuterol, metoprolol, ondansetron (ZOFRAN) IV, oxyCODONE, sodium chloride, sodium chloride, traMADol  Neurologic: intact Heart: IRRR IRRR Lungs: Decreased at bases Abdomen: soft, non-tender; hyperactive bowel sounds; no masses,  no organomegaly Extremities: Mild LE edema Wound: Lower sternal wound with minor sero sanguinous drainage;RLE wound is clean and dry  Lab Results: CBC: Recent Labs  02/20/14 0500 02/21/14 0400  WBC 12.9* 15.3*  HGB 8.6* 8.5*  HCT 26.3* 26.3*  PLT 314 353   BMET:  Recent  Labs  02/20/14 0500 02/21/14 0400  NA 134* 138  K 3.2* 3.0*  CL 99 101  CO2 31 32  GLUCOSE 177* 107*  BUN 22 22  CREATININE 1.47* 1.55*  CALCIUM 8.1* 8.2*    PT/INR:  Recent Labs  02/21/14 0400  LABPROT 18.0*  INR 1.48   Radiology: No results found.   Assessment/Plan: S/P Procedure(s) (LRB): CORONARY ARTERY BYPASS GRAFTING (CABG)X4 LIMA-LAD; SVG-RCA(ENDARDERECTOMY); SVG-OM; SVG-DIAG (N/A) TRANSESOPHAGEAL ECHOCARDIOGRAM (TEE) (N/A)  1. CV-A fib with HR in the low 100's. On Cardizem CD 120 daily, Lopressor 25 bid, Plavix 75 daily, and Coumadin. INR up from 1.28 to 1.48.  2. Pulmonary-CXR this am shows no pneumothorax, bibasilar atelectasis, improvement in vascular congestion. Encourage incentive spirometer. 3. ABL anemia-H and H remains 8.5 and 26.3 4. Gently supplement potassium 5. Creatinine slightly increased  from 1.47 to 1.55. Re check in am 6. Volume overload-on Lasix 40 daily 7. CBGs 94/99/122. On Insulin but will decrease as oral intake marginal and want to avoid hypoglycemia. Pre op HGA1C 5.8. 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 8. LOC constipation  ZIMMERMAN,DONIELLE M PA-C 02/21/2014 8:32 AM Restart iv cardizem Cont coumadin Attempt at rapid atrial Pacing w/o success C diff, ATN, gout attack all improving Keep in ICU  patient examined and medical record reviewed,agree with above note. VAN TRIGT III,PETER 02/21/2014

## 2014-02-21 NOTE — Progress Notes (Signed)
Progress and events noted. May need electrical cardioversion at end of hospital stay. Continue Amio. Back on IV dilt.

## 2014-02-21 NOTE — Progress Notes (Signed)
Patient ID: Gregory MapleRalph D Davila, male   DOB: 27-Oct-1940, 74 y.o.   MRN: [id number]  SICU Evening Rounds:  Hemodynamics stable this pm  Had a-fib with RVR earlier today and put on IV cardizem. Now back in sinus 78  Urine output good.   Ambulated today Up in chair now

## 2014-02-22 LAB — BASIC METABOLIC PANEL
Anion gap: 6 (ref 5–15)
BUN: 23 mg/dL (ref 6–23)
CO2: 31 mmol/L (ref 19–32)
Calcium: 8.4 mg/dL (ref 8.4–10.5)
Chloride: 102 mmol/L (ref 96–112)
Creatinine, Ser: 1.59 mg/dL — ABNORMAL HIGH (ref 0.50–1.35)
GFR calc Af Amer: 48 mL/min — ABNORMAL LOW (ref >90)
GFR calc non Af Amer: 41 mL/min — ABNORMAL LOW (ref >90)
Glucose, Bld: 128 mg/dL — ABNORMAL HIGH (ref 70–99)
Potassium: 3.4 mmol/L — ABNORMAL LOW (ref 3.5–5.1)
Sodium: 139 mmol/L (ref 135–145)

## 2014-02-22 LAB — CBC
HCT: 26.7 % — ABNORMAL LOW (ref 39.0–52.0)
Hemoglobin: 8.5 g/dL — ABNORMAL LOW (ref 13.0–17.0)
MCH: 29 pg (ref 26.0–34.0)
MCHC: 31.8 g/dL (ref 30.0–36.0)
MCV: 91.1 fL (ref 78.0–100.0)
Platelets: 414 10*3/uL — ABNORMAL HIGH (ref 150–400)
RBC: 2.93 MIL/uL — ABNORMAL LOW (ref 4.22–5.81)
RDW: 15.3 % (ref 11.5–15.5)
WBC: 17.5 10*3/uL — ABNORMAL HIGH (ref 4.0–10.5)

## 2014-02-22 LAB — GLUCOSE, CAPILLARY
Glucose-Capillary: 94 mg/dL (ref 70–99)
Glucose-Capillary: 94 mg/dL (ref 70–99)
Glucose-Capillary: 98 mg/dL (ref 70–99)
Glucose-Capillary: 96 mg/dL (ref 70–99)

## 2014-02-22 LAB — PROTIME-INR
INR: 1.53 — ABNORMAL HIGH (ref 0.00–1.49)
Prothrombin Time: 18.6 seconds — ABNORMAL HIGH (ref 11.6–15.2)

## 2014-02-22 MED ORDER — POTASSIUM CHLORIDE 10 MEQ/50ML IV SOLN
10.0000 meq | INTRAVENOUS | Status: AC
  Administered 2014-02-22 (×2): 10 meq via INTRAVENOUS
  Filled 2014-02-22: qty 50

## 2014-02-22 MED ORDER — DILTIAZEM HCL ER COATED BEADS 240 MG PO CP24
240.0000 mg | ORAL_CAPSULE | Freq: Every day | ORAL | Status: DC
  Administered 2014-02-22: 240 mg via ORAL
  Filled 2014-02-22: qty 1

## 2014-02-22 MED ORDER — INSULIN GLARGINE 100 UNIT/ML ~~LOC~~ SOLN
18.0000 [IU] | Freq: Every day | SUBCUTANEOUS | Status: DC
  Administered 2014-02-22: 18 [IU] via SUBCUTANEOUS
  Filled 2014-02-22 (×2): qty 0.18

## 2014-02-22 MED ORDER — LEVALBUTEROL HCL 1.25 MG/0.5ML IN NEBU
1.2500 mg | INHALATION_SOLUTION | Freq: Three times a day (TID) | RESPIRATORY_TRACT | Status: DC
  Administered 2014-02-22 – 2014-02-26 (×13): 1.25 mg via RESPIRATORY_TRACT
  Filled 2014-02-22 (×16): qty 0.5

## 2014-02-22 MED ORDER — AMIODARONE HCL 100 MG PO TABS
100.0000 mg | ORAL_TABLET | Freq: Every day | ORAL | Status: DC
  Administered 2014-02-22 – 2014-03-03 (×10): 100 mg via ORAL
  Filled 2014-02-22 (×11): qty 1

## 2014-02-22 MED ORDER — DILTIAZEM HCL ER COATED BEADS 300 MG PO CP24
300.0000 mg | ORAL_CAPSULE | Freq: Every day | ORAL | Status: DC
  Administered 2014-02-23 – 2014-02-26 (×4): 300 mg via ORAL
  Filled 2014-02-22 (×4): qty 1

## 2014-02-22 NOTE — Progress Notes (Signed)
Attempted to call report to 2W RN. 

## 2014-02-22 NOTE — Discharge Instructions (Signed)
Coronary Artery Bypass Grafting, Care After °Refer to this sheet in the next few weeks. These instructions provide you with information on caring for yourself after your procedure. Your health care provider may also give you more specific instructions. Your treatment has been planned according to current medical practices, but problems sometimes occur. Call your health care provider if you have any problems or questions after your procedure. °WHAT TO EXPECT AFTER THE PROCEDURE °Recovery from surgery will be different for everyone. Some people feel well after 3 or 4 weeks, while for others it takes longer. After your procedure, it is typical to have the following: °· Nausea and a lack of appetite.   °· Constipation. °· Weakness and fatigue.   °· Depression or irritability.   °· Pain or discomfort at your incision site. °HOME CARE INSTRUCTIONS °· Take medicines only as directed by your health care provider. Do not stop taking medicines or start any new medicines without first checking with your health care provider. °· Take your pulse as directed by your health care provider. °· Perform deep breathing as directed by your health care provider. If you were given a device called an incentive spirometer, use it to practice deep breathing several times a day. Support your chest with a pillow or your arms when you take deep breaths or cough. °· Keep incision areas clean, dry, and protected. Remove or change any bandages (dressings) only as directed by your health care provider. You may have skin adhesive strips over the incision areas. Do not take the strips off. They will fall off on their own. °· Check incision areas daily for any swelling, redness, or drainage. °· If incisions were made in your legs, do the following: °¨ Avoid crossing your legs.   °¨ Avoid sitting for long periods of time. Change positions every 30 minutes.   °¨ Elevate your legs when you are sitting. °· Wear compression stockings as directed by your  health care provider. These stockings help keep blood clots from forming in your legs. °· Take showers once your health care provider approves. Until then, only take sponge baths. Pat incisions dry. Do not rub incisions with a washcloth or towel. Do not take baths, swim, or use a hot tub until your health care provider approves. °· Eat foods that are high in fiber, such as raw fruits and vegetables, whole grains, beans, and nuts. Meats should be lean cut. Avoid canned, processed, and fried foods. °· Drink enough fluid to keep your urine clear or pale yellow. °· Weigh yourself every day. This helps identify if you are retaining fluid that may make your heart and lungs work harder. °· Rest and limit activity as directed by your health care provider. You may be instructed to: °¨ Stop any activity at once if you have chest pain, shortness of breath, irregular heartbeats, or dizziness. Get help right away if you have any of these symptoms. °¨ Move around frequently for short periods or take short walks as directed by your health care provider. Increase your activities gradually. You may need physical therapy or cardiac rehabilitation to help strengthen your muscles and build your endurance. °¨ Avoid lifting, pushing, or pulling anything heavier than 10 lb (4.5 kg) for at least 6 weeks after surgery. °· Do not drive until your health care provider approves.  °· Ask your health care provider when you may return to work. °· Ask your health care provider when you may resume sexual activity. °· Keep all follow-up visits as directed by your health care   provider. This is important. °SEEK MEDICAL CARE IF: °· You have swelling, redness, increasing pain, or drainage at the site of an incision. °· You have a fever. °· You have swelling in your ankles or legs. °· You have pain in your legs.   °· You gain 2 or more pounds (0.9 kg) a day. °· You are nauseous or vomit. °· You have diarrhea.  °SEEK IMMEDIATE MEDICAL CARE IF: °· You have  chest pain that goes to your jaw or arms. °· You have shortness of breath.   °· You have a fast or irregular heartbeat.   °· You notice a "clicking" in your breastbone (sternum) when you move.   °· You have numbness or weakness in your arms or legs. °· You feel dizzy or light-headed.   °MAKE SURE YOU: °· Understand these instructions. °· Will watch your condition. °· Will get help right away if you are not doing well or get worse. °Document Released: 07/10/2004 Document Revised: 05/07/2013 Document Reviewed: 05/30/2012 °ExitCare® Patient Information ©2015 [employer]. This information is not intended to replace advice given to you by your health care provider. Make sure you discuss any questions you have with your health care provider. ° °Endoscopic Saphenous Vein Harvesting °Care After °Refer to this sheet in the next few weeks. These instructions provide you with information on caring for yourself after your procedure. Your health care provider may also give you more specific instructions. Your treatment has been planned according to current medical practices, but problems sometimes occur. Call your health care provider if you have any problems or questions after your procedure. °HOME CARE INSTRUCTIONS °Medicine °· Take whatever pain medicine your surgeon prescribes. Follow the directions carefully. Do not take over-the-counter pain medicine unless your surgeon says it is okay. Some pain medicine can cause bleeding problems for several weeks after surgery. °· Follow your surgeon's instructions about driving. You will probably not be permitted to drive after heart surgery. °· Take any medicines your surgeon prescribes. Any medicines you took before your heart surgery should be checked with your health care provider before you start taking them again. °Wound care °· If your surgeon has prescribed an elastic bandage or stocking, ask how long you should wear it. °· Check the area around your surgical cuts  (incisions) whenever your bandages (dressings) are changed. Look for any redness or swelling. °· You will need to return to have the stitches (sutures) or staples taken out. Ask your surgeon when to do that. °· Ask your surgeon when you can shower or bathe. °Activity °· Try to keep your legs raised when you are sitting. °· Do any exercises your health care providers have given you. These may include deep breathing exercises, coughing, walking, or other exercises. °SEEK MEDICAL CARE IF: °· You have any questions about your medicines. °· You have more leg pain, especially if your pain medicine stops working. °· New or growing bruises develop on your leg. °· Your leg swells, feels tight, or becomes red. °· You have numbness in your leg. °SEEK IMMEDIATE MEDICAL CARE IF: °· Your pain gets much worse. °· Blood or fluid leaks from any of the incisions. °· Your incisions become warm, swollen, or red. °· You have chest pain. °· You have trouble breathing. °· You have a fever. °· You have more pain near your leg incision. °MAKE SURE YOU: °· Understand these instructions. °· Will watch your condition. °· Will get help right away if you are not doing well or get worse. °Document Released: 09/02/2010   Document Revised: 12/26/2012 Document Reviewed: 09/02/2010 °ExitCare® Patient Information ©2015 [employer]. This information is not intended to replace advice given to you by your health care provider. Make sure you discuss any questions you have with your health care provider. ° ° °

## 2014-02-22 NOTE — Progress Notes (Signed)
Transferred to 2W21 via WC, monitor and O2, pt placed in bed. NT in to see pt.

## 2014-02-22 NOTE — Discharge Summary (Signed)
Physician Discharge Summary  Patient ID: Gregory Davila MRN: [id number] DOB/AGE: [birth year] 74 y.o.  Admit date: 02/04/2014 Discharge date: 03/06/2014   Admission Diagnoses:  Patient Active Problem List   Diagnosis Date Noted  . Unstable angina 02/04/2014  . Encounter for therapeutic drug monitoring 02/13/2013  . NSTEMI (non-ST elevated myocardial infarction) 07/13/2011  . Atrial fibrillation with RVR 07/12/2011  . Paroxysmal atrial fibrillation   . Coronary atherosclerosis of native coronary artery   . Hypertension   . Renal insufficiency   . Pulmonary embolism   . History of amiodarone therapy 07/27/2010  . Encounter for long-term (current) use of anticoagulants 04/13/2010  . CORONARY ARTERY DISEASE, S/P PTCA 11/20/2009  . EDEMA 11/20/2009  . CHRONIC RHINITIS 02/04/2009     Discharge Diagnoses:   Patient Active Problem List   Diagnosis Date Noted  . 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 Unstable angina 02/04/2014  . Encounter for therapeutic drug monitoring 02/13/2013  . NSTEMI (non-ST elevated myocardial infarction) 07/13/2011  . Atrial fibrillation with RVR 07/12/2011  . Paroxysmal atrial fibrillation   . Coronary atherosclerosis of native coronary artery   . Hypertension   . Renal insufficiency   . Pulmonary embolism   . History of amiodarone therapy 07/27/2010  . Encounter for long-term (current) use of anticoagulants 04/13/2010  . CORONARY ARTERY DISEASE, S/P PTCA 11/20/2009  . EDEMA 11/20/2009  . 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 Paroxysmal atrial fibrillation     a. On Coumadin therapy/amidarone. b. 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  [name]. CAD (coronary artery disease)     a, 2011: occluded LAD  with left to right collaterals and moderate disease in the right coronary artery and circumflex, EF 50%. b. NSTEMI 07/2011 in setting of AF-RVR, MV abnl, cath w/ LAD 100%, RCA mod-severe dz, small vessel, med rx  . Hypertension     long-standing  . Chronic renal insufficiency     followed by primary care physician  . Pulmonary embolism   . Gout   . Anginal pain   . Myocardial infarction      Discharged Condition: good   History of Present Illness:   Gregory Davila is a 74 yo white male who is fairly active. He has a known history of CAD diagnosed originally in 2013 which was being treated with medical therapy. He also has a history of Atrial Fibrillation for which he takes Coumadin. He also has a history of CKD, HTN, PE, and smoking which he quit a long time ago. The patient has been in his usual state of health. However on 02/04/2014 the patient was out playing golf without difficulty. He went to eat after the round was completed at which time he developed 9/10 burning sensation in his chest and epigastric discomfort. He felt it was heart burn and later became nauseated and vomited once. He states his pain decreased to 5/10 at that point. He felt these were most likely symptoms having to due with his heart so he had his wife take him to [facility] hospital. Upon arrival to the ED EKG showed the patient to be in Atrial Fibrillation with RVR. He was treated with Cardizem and Lopressor with successful conversion to NSR. Initial troponin was negative. He was evaluated  by a [facility] provider who recommended admission and further observation. The patient requested transfer to [facility] Course:  Upon transfer, the patient was admitted by Cardiology. The patient was chest pain free and it was felt his chest pain was likely due to PAF. His cardiac enzymes remained negative during his hospitalization. He again developed a dull ache in his chest and it was felt  that due to his known history of CAD, cardiac catheterization was indicated. He was therefore taken off Coumadin and placed on Heparin and treated with Vitamin K to bring his INR down. Catheterization was done 02/08/2014 and revealed severe 3 vessel CAD. A cardiac surgery consult was requested and Dr. Donata Clay saw the patient. It was felt that CABG was the best option for treatment.  All risks, benefits and alternatives of surgery were explained in detail to the patient and he agreed to proceed,  He was taken to the operating room on 02/14/2014.  He underwent CABG x 4 utilizing LIMA to LAD, SVG to OM, SVG to Diagonal, SVG to RCA with endarterectomy.  He also underwent endoscopic saphenous vein harvest from his right leg.  He tolerated the procedure without difficulty and was taken to the SICU in stable condition.  The patient developed an allergic rash intraoperatively, which was felt to be related to platelet transfusion or use of protamine. This resolved with steroids and conservative management.    During his stay in the SICU, the patient was extubated without difficulty.  He was weaned off Dopamine, Neo-Synephrine and Epinephrine as tolerated.  The patient developed rapid atrial fibrillation with RVR.  He was treated with IV Amiodarone and Lopressor.  He initially converted to NSR, but the patient continued to have several episodes and was ultimately started on Coumadin for stroke prophylaxis.  After continued episodes of atrial fibrillation, he was also placed on Cardizem drip in addition to the Amiodarone and Lopressor.  The patient developed diarrhea and was found to be positive for C. Difficile colitis and was started on Vancomycin.  The patient developed a gout flare and was treated with Allopurinol. He was weaned off Cardizem drip as tolerated and was placed on an oral regimen.  His chest tubes and arterial lines were removed without difficulty.  He again converted to NSR and was felt medically stable  for transfer to the step down unit on POD #8.  Since transfer, he has continued to have intermittent a-fib despite multiple medications.  Amiodarone dose was decreased due to prolonged QT interval. Electrophysiology was consulted for assistance with rhythm management. Dr. Johney Frame saw the patient and felt that since his INR was therapeutic and his a-fib was rate controlled, that he should continue his present medication regimen. It was felt that because his his CHADS2VASC was at least 4, he should be anticoagulated long term.  The patient also developed worsening leukocytosis. Urine culture was positive for Klebsiella pneumoniae and Pseudomonas aeruginosa, and he was started on Cipro. Flagyl was also added to his C diff regimen.   He then was noted to have serous drainage from the lower aspect of his sternal wound with instability of the bone. Wound cultures were negative, but the lower sternal wire was noted to have cut through the bone with separation of the sternal edges.  The patient was returned to the operating room on 02/28/2014 for sternal rewiring.  Postoperatively, he has continued to progress well.  His sternum is stable and all drains  have been removed. White blood count has trended back down to baseline.   Stools are normalizing and Vancomycin has been discontinued. He continues to have intermittent atrial fibrillation, but this is improving.  He is ambulating in the halls and tolerating a regular diet. The patient was evaluated on today's date and is medically stable for discharge home.   Significant Diagnostic Studies: angiography:   Hemodynamics: AO 107/66 LV 110/14  Coronary angiography: Coronary dominance: right  Left mainstem: The left main is patent with irregularity but no significant stenosis.  Left anterior descending (LAD): The LAD is totally occluded proximally. The mid and distal vessel filled from left to left collaterals. The diagonal branch also fills via  collaterals.  Left circumflex (LCx): The left circumflex is large in caliber. The vessel is diffusely diseased. The proximal vessel has diffuse nonobstructive stenosis. The mid circumflex has an ulcerated plaque with associated 90% stenosis leading into a large second obtuse marginal branch.  Right coronary artery (RCA): The RCA is diffusely diseased. The mid vessel has diffuse 80% stenosis. Distal vessel is occluded. The acute marginal branch is patent. There is a collateral supply to the most apical portion of the LAD. There is also collateralization of the septal perforators of the LAD.  Left ventriculography: The anterolateral wall is hypokinetic. The true apex has only mild hypokinesis. The other LV wall segments contract normally. The LVEF is estimated at 45%.   Treatments: surgery:   1. Coronary artery bypass grafting x4 (left internal mammary artery to  LAD, saphenous vein graft to first diagonal, saphenous vein graft  to obtuse marginal, saphenous vein graft to right coronary artery). - 02/15/2014 2. Coronary endarterectomy of the right coronary artery. 3. Endoscopic harvest of right leg greater saphenous vein.  4. Debridement of lower sternal incision with sternal rewiring - 02/28/2014    Disposition: 01-Home or Self Care     Discharge Medications:    Medication List    STOP taking these medications        aspirin 81 MG EC tablet     diltiazem 300 MG 24 hr capsule  Commonly known as:  CARDIZEM CD     isosorbide mononitrate 30 MG 24 hr tablet  Commonly known as:  IMDUR     metoprolol tartrate 25 MG tablet  Commonly known as:  LOPRESSOR     pravastatin 20 MG tablet  Commonly known as:  PRAVACHOL      TAKE these medications        allopurinol 300 MG tablet  Commonly known as:  ZYLOPRIM  Take 0.5 tablets (150 mg total) by mouth daily.     amiodarone 200 MG tablet  Commonly known as:  PACERONE  Take 1 tablet (200 mg total) by mouth 2 (two) times daily.      atorvastatin 40 MG tablet  Commonly known as:  LIPITOR  Take 1 tablet (40 mg total) by mouth daily at 6 PM.     carvedilol 3.125 MG tablet  Commonly known as:  COREG  Take 1 tablet (3.125 mg total) by mouth 2 (two) times daily with a meal.     clopidogrel 75 MG tablet  Commonly known as:  PLAVIX  Take 1 tablet (75 mg total) by mouth daily.     COLCRYS 0.6 MG tablet  Generic drug:  colchicine  Take 1 tablet by mouth 2 (two) times daily as needed. For gout     loratadine 10 MG tablet  Commonly known as:  CLARITIN  Take 10 mg by mouth daily.     metroNIDAZOLE 500 MG tablet  Commonly known as:  FLAGYL  Take 1 tablet (500 mg total) by mouth every 8 (eight) hours. X 5 days     multivitamin per tablet  Take 1 tablet by mouth daily.     nitroGLYCERIN 0.4 MG SL tablet  Commonly known as:  NITROSTAT  Place 1 tablet (0.4 mg total) under the tongue every 5 (five) minutes x 3 doses as needed for chest pain.     oxyCODONE 5 MG immediate release tablet  Commonly known as:  Oxy IR/ROXICODONE  Take 1-2 tablets (5-10 mg total) by mouth every 3 (three) hours as needed for severe pain.     saccharomyces boulardii 250 MG capsule  Commonly known as:  FLORASTOR  Take 1 capsule (250 mg total) by mouth 2 (two) times daily.     warfarin 5 MG tablet  Commonly known as:  COUMADIN  Take 2.5 mg daily or as directed by Coumadin Clinic        The patient has been discharged on:   1.Beta Blocker:  Yes [  x ]                              No   [   ]                              If No, reason:  2.Ace Inhibitor/ARB: Yes [   ]                                     No  [  x ]                                     If No, reason: renal insufficiency  3.Statin:   Yes [ x  ]                  No  [   ]                  If No, reason:  4.Ecasa:  Yes  [   ]                  No   [ x  ]                  If No, reason: on Plavix and Coumadin    Follow Up:  Follow-up Information    Follow up with  VAN Dinah Beers, MD On 03/13/2014.   Specialty:  Cardiothoracic Surgery   Why:  Office will contact you with an appointment   Contact information:   [address] [place] [address] [phone]       Follow up with [facility] On 03/07/2014.   Specialty:  Cardiology   Why:  Please have bloodwork for Coumadin (PT/INR) drawn at the [facility] cardiology office    Contact information:   [address] [place] [address] [phone]      Follow up with Tonny Bollman, MD On 03/21/2014.   Specialty:  Cardiology   Why:  Appointment is  at 2:45   Contact information:   [address] [place] [address] [phone]       Follow up with [facility].   Specialty:  Home Health Services   Why:  Home Health RN arranged   Contact information:   [address] [place] [address] [phone]       Signed: Adella Hare 03/06/2014, 8:27 AM

## 2014-02-22 NOTE — Progress Notes (Signed)
Back in NSR. Hopefully amio will prevent recurrence.

## 2014-02-22 NOTE — Progress Notes (Signed)
Pt transferred to 2w21 from 2s; enteric isolation measures in place; pt with positive c-diff; pt education given regarding isolation policies; pt awake and alert; call bell w/i reach; no complaints at this time; will cont. To monitor.

## 2014-02-22 NOTE — Progress Notes (Signed)
Report to 2 W RN 

## 2014-02-22 NOTE — Progress Notes (Signed)
8 Days Post-Op Procedure(s) (LRB): CORONARY ARTERY BYPASS GRAFTING (CABG)X4 LIMA-LAD; SVG-RCA(ENDARDERECTOMY); SVG-OM; SVG-DIAG (N/A) TRANSESOPHAGEAL ECHOCARDIOGRAM (TEE) (N/A) Subj  Feels well  Converted to nsr Gout better  Objective: Vital signs in last 24 hours: Temp:  [97.4 F (36.3 C)-99.1 F (37.3 C)] 97.8 F (36.6 C) (02/19 0354) Pulse Rate:  [62-115] 69 (02/19 0800) Cardiac Rhythm:  [-] Normal sinus rhythm (02/19 0800) Resp:  [10-22] 18 (02/19 0800) BP: (95-129)/(54-74) 128/63 mmHg (02/19 0800) SpO2:  [91 %-97 %] 95 % (02/19 0800) Weight:  [227 lb 11.8 oz (103.3 kg)] 227 lb 11.8 oz (103.3 kg) (02/19 0500)  Hemodynamic parameters for last 24 hours:  stable  Intake/Output from previous day: 02/18 0701 - 02/19 0700 In: 1450 [P.O.:840; I.V.:460; IV Piggyback:150] Out: 1427 [Urine:1426; Stool:1] Intake/Output this shift: Total I/O In: 20 [I.V.:20] Out: -   Min edema abd non tender extrem warm  Lab Results:  Recent Labs  02/21/14 0400 02/22/14 0423  WBC 15.3* 17.5*  HGB 8.5* 8.5*  HCT 26.3* 26.7*  PLT 353 414*   BMET:  Recent Labs  02/21/14 0400 02/22/14 0423  NA 138 139  K 3.0* 3.4*  CL 101 102  CO2 32 31  GLUCOSE 107* 128*  BUN 22 23  CREATININE 1.55* 1.59*  CALCIUM 8.2* 8.4    PT/INR:  Recent Labs  02/22/14 0423  LABPROT 18.6*  INR 1.53*   ABG    Component Value Date/Time   PHART 7.356 02/15/2014 0402   HCO3 22.4 02/15/2014 0402   TCO2 24 02/15/2014 0402   ACIDBASEDEF 3.0* 02/15/2014 0402   O2SAT 94.0 02/15/2014 0402   CBG (last 3)   Recent Labs  02/21/14 1116 02/21/14 1523 02/21/14 2142  GLUCAP 119* 113* 91    Assessment/Plan: S/P Procedure(s) (LRB): CORONARY ARTERY BYPASS GRAFTING (CABG)X4 LIMA-LAD; SVG-RCA(ENDARDERECTOMY); SVG-OM; SVG-DIAG (N/A) TRANSESOPHAGEAL ECHOCARDIOGRAM (TEE) (N/A) Plan for transfer to step-down: see transfer orders Decrease iv cardizem to 5/hr and increase po cardizem to 240mg  QT prolonged  so drop amiodarone to 100 mg daily supp K+ Cont coumadin  Stop iv cardizem tomorrow if NSR maintained  LOS: 18 days    VAN TRIGT III,PETER 02/22/2014

## 2014-02-23 LAB — PROTIME-INR
INR: 1.7 — ABNORMAL HIGH (ref 0.00–1.49)
Prothrombin Time: 20.1 seconds — ABNORMAL HIGH (ref 11.6–15.2)

## 2014-02-23 LAB — BASIC METABOLIC PANEL
Anion gap: 5 (ref 5–15)
BUN: 23 mg/dL (ref 6–23)
CO2: 32 mmol/L (ref 19–32)
Calcium: 8.2 mg/dL — ABNORMAL LOW (ref 8.4–10.5)
Chloride: 101 mmol/L (ref 96–112)
Creatinine, Ser: 1.66 mg/dL — ABNORMAL HIGH (ref 0.50–1.35)
GFR calc Af Amer: 46 mL/min — ABNORMAL LOW (ref >90)
GFR calc non Af Amer: 39 mL/min — ABNORMAL LOW (ref >90)
Glucose, Bld: 84 mg/dL (ref 70–99)
Potassium: 3.7 mmol/L (ref 3.5–5.1)
Sodium: 138 mmol/L (ref 135–145)

## 2014-02-23 LAB — GLUCOSE, CAPILLARY
Glucose-Capillary: 94 mg/dL (ref 70–99)
Glucose-Capillary: 114 mg/dL — ABNORMAL HIGH (ref 70–99)
Glucose-Capillary: 129 mg/dL — ABNORMAL HIGH (ref 70–99)
Glucose-Capillary: 86 mg/dL (ref 70–99)

## 2014-02-23 NOTE — Progress Notes (Signed)
CARDIAC REHAB PHASE I   PRE:  Rate/Rhythm: 73 nsr  BP:  Sitting: 118/71      SaO2: 95 1L, 88 RA while sitting, walked on 1L O2  MODE:  Ambulation: 350 ft   POST:  Rate/Rhythm: 83 sr  BP:  Sitting: 105/70     SaO2: 92 1L  1355-1450 Patient ambulated in hallway with RWx1 assist. Slow steady gait noted. Rest break taken x 1. Patient complained of pain in feet from gout but stated it is much better than yesterday. Denied all other complaints.Patient stated he would have been able to walk farther but had to quickly return to room to use bathroom. Patient continues to have loose stools from cdiff. Post bathroom, patient back to chair with call bell in reach and wife at bedside.  Maude LerichePayne, Sricharan Lacomb EnglishRN, BSN 02/23/2014 2:42 PM

## 2014-02-23 NOTE — Progress Notes (Signed)
Pt ambulated 350 feet with rolling walker and oxygen at 1L. His heart rate elevated to 120's-140 in atrial fib while walking. He denied any symptoms. He took one brief standing rest break. His heart rate came down and he converted back to NSR over about 10 minutes while seated on side of bed.

## 2014-02-23 NOTE — Progress Notes (Addendum)
[address]       [place] [address]             [phone]          9 Days Post-Op Procedure(s) (LRB): CORONARY ARTERY BYPASS GRAFTING (CABG)X4 LIMA-LAD; SVG-RCA(ENDARDERECTOMY); SVG-OM; SVG-DIAG (N/A) TRANSESOPHAGEAL ECHOCARDIOGRAM (TEE) (N/A)  Subjective: "I feel the best today that I have."  No loose stools since yesterday. Appetite improved. Gout symptoms resolved.  Still having runs of RAF.   Objective: Vital signs in last 24 hours: Patient Vitals for the past 24 hrs:  BP Temp Temp src Pulse Resp SpO2 Weight  02/23/14 0448 104/69 mmHg 97.7 F (36.5 C) Oral (!) 105 20 92 % 227 lb 15.3 oz (103.4 kg)  02/22/14 1947 106/65 mmHg 98.8 F (37.1 C) Oral 99 (!) 22 90 % -  02/22/14 1928 - - - - - 93 % -  02/22/14 1453 124/65 mmHg 97.7 F (36.5 C) Oral 72 20 96 % -  02/22/14 1400 130/74 mmHg - - - (!) 30 95 % -  02/22/14 1300 (!) 106/57 mmHg - - 71 17 95 % -  02/22/14 1200 108/65 mmHg 97.1 F (36.2 C) Axillary 65 16 94 % -  02/22/14 1100 109/69 mmHg - - 67 18 95 % -  02/22/14 1045 108/60 mmHg - - - - - -  02/22/14 1000 - - - 77 (!) 22 96 % -  02/22/14 0938 - - - - - 96 % -  02/22/14 0926 114/67 mmHg - - 76 - - -  02/22/14 0900 114/67 mmHg 97.8 F (36.6 C) Oral 69 15 97 % -   Current Weight  02/23/14 227 lb 15.3 oz (103.4 kg)  PRE-OPERATIVE WEIGHT: 103kg    Intake/Output from previous day: 02/19 0701 - 02/20 0700 In: 800.4 [P.O.:600; I.V.:100.4; IV Piggyback:100] Out: 1077 [Urine:1076; Stool:1]  CBGs 21-30-86   PHYSICAL EXAM:  Heart: Irr irr Lungs: Clear Wound: Clean and dry Extremities: +LE edema    Lab Results: CBC: Recent Labs  02/21/14 0400 02/22/14 0423  WBC 15.3* 17.5*  HGB 8.5* 8.5*  HCT 26.3* 26.7*  PLT 353 414*   BMET:  Recent Labs  02/22/14 0423 02/23/14 0400  NA 139 138  K 3.4* 3.7  CL 102 101  CO2 31 32  GLUCOSE 128* 84  BUN 23 23  CREATININE 1.59* 1.66*  CALCIUM 8.4 8.2*    PT/INR:  Recent  Labs  02/23/14 0400  LABPROT 20.1*  INR 1.70*      Assessment/Plan: S/P Procedure(s) (LRB): CORONARY ARTERY BYPASS GRAFTING (CABG)X4 LIMA-LAD; SVG-RCA(ENDARDERECTOMY); SVG-OM; SVG-DIAG (N/A) TRANSESOPHAGEAL ECHOCARDIOGRAM (TEE) (N/A)  CV- Intermittent AF this am, presently rates 100-110s. Continue IV/po Dilt, low dose Amio (decreased due to prolonged QT), po Lopressor, Coumadin.  Plavix for coronary endarterectomy.  Vol overload- diurese.  A/CRF- Cr up further this am. Continue to monitor.  C diff colitis- symptomatically improved.  Continue po Vanc.    Leukocytosis- WBC increased today, no fever. Denies cough or dysuria. Continue pulm toilet and watch.  CBGs stable, pt not diabetic.  Will d/c Lantus, continue SSI for now.  Expected postop blood loss anemia- H/H generally stable.  CRPI, pulm toilet.   LOS: 19 days    [name],[name] 02/23/2014  Follow up cbc in am Small amount drainage lower sternal wound I have seen and examined Loraine Maple and agree with the above assessment  and plan.  Delight Ovens MD Beeper  [phone] Office [phone] 02/23/2014 1:40 PM

## 2014-02-24 LAB — BASIC METABOLIC PANEL
Anion gap: 4 — ABNORMAL LOW (ref 5–15)
BUN: 23 mg/dL (ref 6–23)
CO2: 33 mmol/L — ABNORMAL HIGH (ref 19–32)
Calcium: 8.2 mg/dL — ABNORMAL LOW (ref 8.4–10.5)
Chloride: 99 mmol/L (ref 96–112)
Creatinine, Ser: 1.66 mg/dL — ABNORMAL HIGH (ref 0.50–1.35)
GFR calc Af Amer: 46 mL/min — ABNORMAL LOW (ref >90)
GFR calc non Af Amer: 39 mL/min — ABNORMAL LOW (ref >90)
Glucose, Bld: 90 mg/dL (ref 70–99)
Potassium: 3.4 mmol/L — ABNORMAL LOW (ref 3.5–5.1)
Sodium: 136 mmol/L (ref 135–145)

## 2014-02-24 LAB — PROTIME-INR
INR: 1.79 — ABNORMAL HIGH (ref 0.00–1.49)
Prothrombin Time: 21 seconds — ABNORMAL HIGH (ref 11.6–15.2)

## 2014-02-24 LAB — GLUCOSE, CAPILLARY: Glucose-Capillary: 97 mg/dL (ref 70–99)

## 2014-02-24 LAB — URINE MICROSCOPIC-ADD ON

## 2014-02-24 LAB — CBC
HCT: 29 % — ABNORMAL LOW (ref 39.0–52.0)
Hemoglobin: 9.2 g/dL — ABNORMAL LOW (ref 13.0–17.0)
MCH: 28.8 pg (ref 26.0–34.0)
MCHC: 31.7 g/dL (ref 30.0–36.0)
MCV: 90.6 fL (ref 78.0–100.0)
Platelets: 527 10*3/uL — ABNORMAL HIGH (ref 150–400)
RBC: 3.2 MIL/uL — ABNORMAL LOW (ref 4.22–5.81)
RDW: 15.3 % (ref 11.5–15.5)
WBC: 18.9 10*3/uL — ABNORMAL HIGH (ref 4.0–10.5)

## 2014-02-24 LAB — URINALYSIS, ROUTINE W REFLEX MICROSCOPIC
Bilirubin Urine: NEGATIVE
Glucose, UA: NEGATIVE mg/dL
Hgb urine dipstick: NEGATIVE
Ketones, ur: NEGATIVE mg/dL
Nitrite: NEGATIVE
Protein, ur: NEGATIVE mg/dL
Specific Gravity, Urine: 1.009 (ref 1.005–1.030)
Urobilinogen, UA: 0.2 mg/dL (ref 0.0–1.0)
pH: 6.5 (ref 5.0–8.0)

## 2014-02-24 MED ORDER — DIGOXIN 125 MCG PO TABS
0.1250 mg | ORAL_TABLET | Freq: Every day | ORAL | Status: DC
  Administered 2014-02-24 – 2014-02-26 (×3): 0.125 mg via ORAL
  Filled 2014-02-24 (×3): qty 1

## 2014-02-24 MED ORDER — SACCHAROMYCES BOULARDII 250 MG PO CAPS
250.0000 mg | ORAL_CAPSULE | Freq: Two times a day (BID) | ORAL | Status: DC
  Administered 2014-02-24 – 2014-03-05 (×19): 250 mg via ORAL
  Filled 2014-02-24 (×20): qty 1

## 2014-02-24 MED ORDER — POTASSIUM CHLORIDE ER 10 MEQ PO TBCR
20.0000 meq | EXTENDED_RELEASE_TABLET | Freq: Three times a day (TID) | ORAL | Status: DC
Start: 2014-02-24 — End: 2014-02-25
  Administered 2014-02-24 – 2014-02-25 (×3): 20 meq via ORAL
  Filled 2014-02-24 (×5): qty 2

## 2014-02-24 NOTE — Progress Notes (Signed)
Pt ambulated x1 assist for 36500ft on room air using a walker. Pt did not complain of SOB and did not have difficulty while walking. Pt did experience a HR in the 120s while walking but when walking was completed pt went back into Sinus Rhythm with HR in the 90s.

## 2014-02-24 NOTE — Progress Notes (Addendum)
       [address]       [place] [id number]             [phone]          10 Days Post-Op Procedure(s) (LRB): CORONARY ARTERY BYPASS GRAFTING (CABG)X4 LIMA-LAD; SVG-RCA(ENDARDERECTOMY); SVG-OM; SVG-DIAG (N/A) TRANSESOPHAGEAL ECHOCARDIOGRAM (TEE) (N/A)  Subjective: Feels a little better today. Still having several loose stools daily. Continues to have runs of AF.    Objective: Vital signs in last 24 hours: Patient Vitals for the past 24 hrs:  BP Temp Temp src Pulse Resp SpO2 Weight  02/24/14 0850 - - - - - 92 % -  02/24/14 0458 92/66 mmHg 98.6 F (37 C) Oral 99 16 92 % 226 lb 12.8 oz (102.876 kg)  02/23/14 2141 - - - (!) 125 - - -  02/23/14 1945 (!) 108/58 mmHg 98.7 F (37.1 C) Oral 77 18 92 % -  02/23/14 1931 - - - - - 96 % -  02/23/14 1442 99/65 mmHg 97.5 F (36.4 C) Oral 79 20 97 % -  02/23/14 1034 - - - - - 95 % -  02/23/14 1032 104/72 mmHg - - (!) 110 - 92 % -   Current Weight  02/24/14 226 lb 12.8 oz (102.876 kg)  PRE-OPERATIVE WEIGHT: 103kg   Intake/Output from previous day: 02/20 0701 - 02/21 0700 In: 360 [P.O.:360] Out: 475 [Urine:475]    PHYSICAL EXAM:  Heart: RRR Lungs: Few crackles in bases Wound: Clean and dry, no sternal drainage Extremities: +LE edema, L forearm with erythema and warmth at old IV site, firm to touch    Lab Results: CBC: Recent Labs  02/22/14 0423 02/24/14 0615  WBC 17.5* 18.9*  HGB 8.5* 9.2*  HCT 26.7* 29.0*  PLT 414* 527*   BMET:  Recent Labs  02/23/14 0400 02/24/14 0615  NA 138 136  K 3.7 3.4*  CL 101 99  CO2 32 33*  GLUCOSE 84 90  BUN 23 23  CREATININE 1.66* 1.66*  CALCIUM 8.2* 8.2*    PT/INR:  Recent Labs  02/24/14 0615  LABPROT 21.0*  INR 1.79*      Assessment/Plan: S/P Procedure(s) (LRB): CORONARY ARTERY BYPASS GRAFTING (CABG)X4 LIMA-LAD; SVG-RCA(ENDARDERECTOMY); SVG-OM; SVG-DIAG (N/A) TRANSESOPHAGEAL ECHOCARDIOGRAM (TEE) (N/A)  CV- Continues to have episodes AF,  presently SR. Continue po Dilt, low dose Amio (decreased due to prolonged QT), po Lopressor, Coumadin. Plavix for coronary endarterectomy.  BPs low normal.  Vol overload- diurese.  A/CRF- Cr stabilized.  Continue to monitor.  C diff colitis- Continue po Vanc. Will add florastor to help with symptoms.  Leukocytosis- WBC up further today, no fever. No more sternal drainage noted. Denies cough or dysuria. Possibly related to forearm thrombophlebitis.  Pt already receiving po Vanc.  Will watch, check u/a. CXR ordered for am.  CBGs stable, pt not diabetic. Will d/c CBGs, SSI.  Expected postop blood loss anemia- H/H stable.  CRPI, pulm toilet.   LOS: 20 days    Gregory,Davila H 02/24/2014  Limited by increased HR with activity Small amt drainage lower sternum, serious cultured On exam lower sternum unstable Wound intact Slowly increasing WBC, no fever notes yesterday that had increased loose stools I have seen and examined Gregory Davila and agree with the above assessment  and plan.  [name] MD Beeper [phone] Office [phone] 02/24/2014 12:36 PM

## 2014-02-24 NOTE — Progress Notes (Signed)
Pt ambulated x1 assist for 21950ft on room air using a walker.  Pt did not complain of SOB and did not have difficulty while walking.  He did go into a fib with HR in the 140s-150s while walking but when walking was completed pt went back into Sinus Rhythm with HR in the 80s.

## 2014-02-24 NOTE — Progress Notes (Signed)
MD made aware of moderate amounts of leukocytes in pt's urine.  Cultures were orders.  Lab made aware.

## 2014-02-24 NOTE — Progress Notes (Signed)
Pt ambulated 300 feet with rolling walker at good pace. He denied pain or dyspnea. His heart rate prior to walk was 98. During walk max HR 140, a-fib. HR converted to sinus rhythm in the 80's shortly after returning to bed.

## 2014-02-24 NOTE — Progress Notes (Signed)
Pt back in sinus rhythm in 70's.

## 2014-02-24 NOTE — Progress Notes (Signed)
Pt's heart rhythm had been sinus rhythm with occasional rate control atrial fib, however this morning after 0310 the patient has maintained a-fib in the 90's.

## 2014-02-25 ENCOUNTER — Inpatient Hospital Stay (HOSPITAL_COMMUNITY): Payer: Medicare Other

## 2014-02-25 LAB — BASIC METABOLIC PANEL
Anion gap: 7 (ref 5–15)
BUN: 21 mg/dL (ref 6–23)
CO2: 30 mmol/L (ref 19–32)
Calcium: 8.1 mg/dL — ABNORMAL LOW (ref 8.4–10.5)
Chloride: 99 mmol/L (ref 96–112)
Creatinine, Ser: 1.64 mg/dL — ABNORMAL HIGH (ref 0.50–1.35)
GFR calc Af Amer: 46 mL/min — ABNORMAL LOW (ref >90)
GFR calc non Af Amer: 40 mL/min — ABNORMAL LOW (ref >90)
Glucose, Bld: 94 mg/dL (ref 70–99)
Potassium: 3.3 mmol/L — ABNORMAL LOW (ref 3.5–5.1)
Sodium: 136 mmol/L (ref 135–145)

## 2014-02-25 LAB — CBC
HCT: 29.3 % — ABNORMAL LOW (ref 39.0–52.0)
Hemoglobin: 9.3 g/dL — ABNORMAL LOW (ref 13.0–17.0)
MCH: 28.7 pg (ref 26.0–34.0)
MCHC: 31.7 g/dL (ref 30.0–36.0)
MCV: 90.4 fL (ref 78.0–100.0)
Platelets: 534 10*3/uL — ABNORMAL HIGH (ref 150–400)
RBC: 3.24 MIL/uL — ABNORMAL LOW (ref 4.22–5.81)
RDW: 15.3 % (ref 11.5–15.5)
WBC: 20.1 10*3/uL — ABNORMAL HIGH (ref 4.0–10.5)

## 2014-02-25 LAB — PROTIME-INR
INR: 1.99 — ABNORMAL HIGH (ref 0.00–1.49)
Prothrombin Time: 22.8 seconds — ABNORMAL HIGH (ref 11.6–15.2)

## 2014-02-25 MED ORDER — METRONIDAZOLE 500 MG PO TABS
500.0000 mg | ORAL_TABLET | Freq: Three times a day (TID) | ORAL | Status: DC
  Administered 2014-02-25 – 2014-03-05 (×23): 500 mg via ORAL
  Filled 2014-02-25 (×27): qty 1

## 2014-02-25 MED ORDER — VANCOMYCIN HCL 10 G IV SOLR
1250.0000 mg | INTRAVENOUS | Status: DC
  Administered 2014-02-25 – 2014-02-28 (×4): 1250 mg via INTRAVENOUS
  Filled 2014-02-25 (×5): qty 1250

## 2014-02-25 MED ORDER — POTASSIUM CHLORIDE 10 MEQ/100ML IV SOLN
10.0000 meq | INTRAVENOUS | Status: AC
  Administered 2014-02-25 (×4): 10 meq via INTRAVENOUS
  Filled 2014-02-25 (×4): qty 100

## 2014-02-25 MED ORDER — METRONIDAZOLE 500 MG PO TABS
500.0000 mg | ORAL_TABLET | Freq: Three times a day (TID) | ORAL | Status: DC
  Filled 2014-02-25 (×4): qty 1

## 2014-02-25 MED ORDER — VANCOMYCIN HCL IN DEXTROSE 1-5 GM/200ML-% IV SOLN: 1000.0000 mg | INTRAVENOUS | Status: DC

## 2014-02-25 NOTE — Progress Notes (Signed)
Found undigested potassium tablet in commode after pt defecated.  Pt states that he was told yesterday that his pill was in the commode.  Will contact MD to make aware. Pt ambulated in the hallway with wife using rolling walker on room air. Ambulated approximately 550 ft.  Pt resting with call bell within reach.  Will continue to monitor. Thomas HoffBurton, Shailynn Fong McClintock, RN

## 2014-02-25 NOTE — Progress Notes (Signed)
Removed epicardial wires per order. 3 intact.  Pt tolerated procedure well.  Pt instructed to remain on bedrest for one hour.  Frequent vitals will be taken and documented. Pt resting with call bell within reach. Dynasia Kercheval McClintock, RN   

## 2014-02-25 NOTE — Progress Notes (Signed)
ANTIBIOTIC CONSULT NOTE - INITIAL  Pharmacy Consult for vancomycin Indication: empiric  No Known Allergies  Patient Measurements: Height:  (180.3 cm) Weight: 224 lb 10.4 oz (101.9 kg) IBW/kg (Calculated) : 75.3 Adjusted Body Weight:   Vital Signs: Temp: 98.1 F (36.7 C) (02/22 0704) Temp Source: Oral (02/22 0704) BP: 111/58 mmHg (02/22 0704) Pulse Rate: 74 (02/22 0704) Intake/Output from previous day: 02/21 0701 - 02/22 0700 In: 120 [P.O.:120] Out: -  Intake/Output from this shift:    Labs:  Recent Labs  02/23/14 0400 02/24/14 0615 02/25/14 0535  WBC  --  18.9* 20.1*  HGB  --  9.2* 9.3*  PLT  --  527* 534*  CREATININE 1.66* 1.66* 1.64*   Estimated Creatinine Clearance: 48.7 mL/min (by C-G formula based on Cr of 1.64). No results for input(s): VANCOTROUGH, VANCOPEAK, VANCORANDOM, GENTTROUGH, GENTPEAK, GENTRANDOM, TOBRATROUGH, TOBRAPEAK, TOBRARND, AMIKACINPEAK, AMIKACINTROU, AMIKACIN in the last 72 hours.   Microbiology: Recent Results (from the past 720 hour(s))  MRSA PCR Screening     Status: Abnormal   Collection Time: 02/04/14  4:50 PM  Result Value Ref Range Status   MRSA by PCR POSITIVE (A) NEGATIVE Final    Comment:        The GeneXpert MRSA Assay (FDA approved for NASAL specimens only), is one component of a comprehensive MRSA colonization surveillance program. It is not intended to diagnose MRSA infection nor to guide or monitor treatment for MRSA infections. RESULT CALLED TO, READ BACK BY AND VERIFIED WITH: M,MCNEIL,RN AT 2055 BY L.PITT 02/04/14   Surgical pcr screen     Status: Abnormal   Collection Time: 02/13/14 12:48 PM  Result Value Ref Range Status   MRSA, PCR POSITIVE (A) NEGATIVE Final   Staphylococcus aureus POSITIVE (A) NEGATIVE Final    Comment:        The Xpert SA Assay (FDA approved for NASAL specimens in patients over 62 years of age), is one component of a comprehensive surveillance program.  Test performance has been  validated by [facility] for patients greater than or equal to 41 year old. It is not intended to diagnose infection nor to guide or monitor treatment.   Clostridium Difficile by PCR     Status: Abnormal   Collection Time: 02/18/14  3:00 AM  Result Value Ref Range Status   C difficile by pcr POSITIVE (A) NEGATIVE Final    Comment: CRITICAL RESULT CALLED TO, READ BACK BY AND VERIFIED WITH: D. VERNON RN 9:45 02/18/14 ([email or web address])   Wound culture     Status: None (Preliminary result)   Collection Time: 02/24/14 12:15 PM  Result Value Ref Range Status   Specimen Description STERNUM  Final   Special Requests NONE  Final   Gram Stain PENDING  Incomplete   Culture   Final    NO GROWTH 1 DAY Performed at [facility]    Report Status PENDING  Incomplete    Medical History: Past Medical History  Diagnosis Date  . Paroxysmal atrial fibrillation     a. On Coumadin therapy/amidarone. b. 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  [name] CAD (coronary artery disease)     a, 2011: occluded LAD with left to right collaterals and moderate disease in the right coronary artery and circumflex, EF 50%. b. NSTEMI 07/2011 in setting of AF-RVR, MV abnl, cath w/ LAD 100%, RCA mod-severe dz, small vessel, med rx  . Hypertension     long-standing  . Chronic renal insufficiency  followed by primary care physician  . Pulmonary embolism   . Gout   . Anginal pain     Medications:  Anti-infectives    Start     Dose/Rate Route Frequency Ordered Stop   02/25/14 0900  vancomycin (VANCOCIN) 1,250 mg in sodium chloride 0.9 % 250 mL IVPB     1,250 mg 166.7 mL/hr over 90 Minutes Intravenous Every 24 hours 02/25/14 0751     02/25/14 0830  metroNIDAZOLE (FLAGYL) tablet 500 mg     500 mg Oral Every 8 hours 02/25/14 0739     02/25/14 0745  vancomycin (VANCOCIN) IVPB 1000 mg/200 mL premix  Status:  Discontinued     1,000 mg 200 mL/hr over 60 Minutes Intravenous Every 24 hours  02/25/14 0739 02/25/14 0743   02/18/14 1115  vancomycin (VANCOCIN) 50 mg/mL oral solution 125 mg  Status:  Discontinued     125 mg Oral 4 times daily 02/18/14 1059 02/25/14 0739   02/14/14 2015  vancomycin (VANCOCIN) IVPB 1000 mg/200 mL premix     1,000 mg 200 mL/hr over 60 Minutes Intravenous  Once 02/14/14 1425 02/14/14 2107   02/14/14 1700  cefUROXime (ZINACEF) 1.5 g in dextrose 5 % 50 mL IVPB     1.5 g 100 mL/hr over 30 Minutes Intravenous Every 12 hours 02/14/14 1425 02/16/14 0617   02/14/14 0400  vancomycin (VANCOCIN) 1,250 mg in sodium chloride 0.9 % 250 mL IVPB     1,250 mg 166.7 mL/hr over 90 Minutes Intravenous To Surgery 02/13/14 1225 02/14/14 0926   02/14/14 0400  cefUROXime (ZINACEF) 1.5 g in dextrose 5 % 50 mL IVPB     1.5 g 100 mL/hr over 30 Minutes Intravenous To Surgery 02/13/14 1225 02/14/14 1239   02/14/14 0400  cefUROXime (ZINACEF) 750 mg in dextrose 5 % 50 mL IVPB  Status:  Discontinued     750 mg 100 mL/hr over 30 Minutes Intravenous To Surgery 02/13/14 1225 02/14/14 1425     Assessment: 74 yom s/p CABG on 2/11 to start empiric vancomycin for leukocytosis. Also on flagyl for treatment of Cdiff. Pt is afebrile and WBC is elevated at 20.1. Scr is elevated at 1.64 but has been stable.  vanc 2/22>>  Goal of Therapy:  Vancomycin trough level 15-20 mcg/ml  Plan:  1. Vanc 1250mg  IV Q24H 2. F/u renal fxn, C&S, clinical [facility]  Derald Lorge, Drake Leachachel Lynn 02/25/2014,7:52 AM

## 2014-02-25 NOTE — Progress Notes (Signed)
Medicare Important Message given? YES (If response is "NO", the following Medicare IM given date fields will be blank) Date Medicare IM given:02/25/2014 Medicare IM given by: Leshawn Straka 

## 2014-02-25 NOTE — Progress Notes (Signed)
CARDIAC REHAB PHASE I   PRE:  Rate/Rhythm: 83 SR  BP:  Supine:   Sitting: 102/61  Standing:    SaO2: 95 RA  MODE:  Ambulation: 550 ft   POST:  Rate/Rhythm: 132 Afib  BP:  Supine:   Sitting: 11/69  Standing:    SaO2: 94 RA 1000-1040 Assisted X 1 and used walker to ambulate. Gait steady with walker, slow pace. Pt was able to walk 550 feet without c/o. HR after walk 132 Afib. Pt to bed after walk to get pacing wires discontinued. HR decreasing with rest but remains Afib.  Melina CopaLisa Hilberto Burzynski RN 02/25/2014 10:39 AM

## 2014-02-25 NOTE — Progress Notes (Addendum)
[address]       [place] [address]             [phone]      11 Days Post-Op Procedure(s) (LRB): CORONARY ARTERY BYPASS GRAFTING (CABG)X4 LIMA-LAD; SVG-RCA(ENDARDERECTOMY); SVG-OM; SVG-DIAG (N/A) TRANSESOPHAGEAL ECHOCARDIOGRAM (TEE) (N/A) Subjective: Feels ok, stools are more solid  Objective: Vital signs in last 24 hours: Temp:  [98.1 F (36.7 C)-98.7 F (37.1 C)] 98.1 F (36.7 C) (02/22 0704) Pulse Rate:  [73-131] 74 (02/22 0704) Cardiac Rhythm:  [-] Normal sinus rhythm (02/22 0752) Resp:  [16-18] 16 (02/22 0704) BP: (98-113)/(55-73) 111/58 mmHg (02/22 0704) SpO2:  [88 %-96 %] 94 % (02/22 0704) Weight:  [224 lb 10.4 oz (101.9 kg)] 224 lb 10.4 oz (101.9 kg) (02/22 0704)  Hemodynamic parameters for last 24 hours:    Intake/Output from previous day: 02/21 0701 - 02/22 0700 In: 120 [P.O.:120] Out: -  Intake/Output this shift:    General appearance: alert, cooperative and no distress Heart: regular rate and rhythm Lungs: dim in left base Abdomen: mild distension, nontender Extremities: + lower ext edema Wound: incis healing well  Lab Results:  Recent Labs  02/24/14 0615 02/25/14 0535  WBC 18.9* 20.1*  HGB 9.2* 9.3*  HCT 29.0* 29.3*  PLT 527* 534*   BMET:  Recent Labs  02/24/14 0615 02/25/14 0535  NA 136 136  K 3.4* 3.3*  CL 99 99  CO2 33* 30  GLUCOSE 90 94  BUN 23 21  CREATININE 1.66* 1.64*  CALCIUM 8.2* 8.1*    PT/INR:  Recent Labs  02/25/14 0535  LABPROT 22.8*  INR 1.99*   ABG    Component Value Date/Time   PHART 7.356 02/15/2014 0402   HCO3 22.4 02/15/2014 0402   TCO2 24 02/15/2014 0402   ACIDBASEDEF 3.0* 02/15/2014 0402   O2SAT 94.0 02/15/2014 0402   CBG (last 3)   Recent Labs  02/23/14 1621 02/23/14 2117 02/24/14 0603  GLUCAP 129* 86 97    Meds Scheduled Meds: . allopurinol  150 mg Oral Daily  . amiodarone  100 mg Oral Daily  . atorvastatin  40 mg Oral q1800  . bisacodyl  10 mg Oral  Daily   Or  . bisacodyl  10 mg Rectal Daily  . clopidogrel  75 mg Oral Daily  . colchicine  0.6 mg Oral Daily  . digoxin  0.125 mg Oral Daily  . diltiazem  300 mg Oral Daily  . docusate sodium  200 mg Oral Daily  . furosemide  40 mg Oral Daily  . levalbuterol  1.25 mg Nebulization TID  . metoprolol tartrate  25 mg Oral BID  . metroNIDAZOLE  500 mg Oral Q8H  . pantoprazole  40 mg Oral Daily  . potassium chloride  20 mEq Oral TID  . saccharomyces boulardii  250 mg Oral BID  . simethicone  80 mg Oral QID  . sodium chloride  10-40 mL Intracatheter Q12H  . sodium chloride  3 mL Intravenous Q12H  . vancomycin  1,250 mg Intravenous Q24H  . warfarin  5 mg Oral q1800  . Warfarin - Physician Dosing Inpatient   Does not apply q1800   Continuous Infusions: . sodium chloride 20 mL/hr at 02/15/14 0300  . sodium chloride    . sodium chloride 10 mL/hr at 02/22/14 1400  . lactated ringers 10 mL/hr at 02/16/14 1800   PRN Meds:.metoprolol, ondansetron (ZOFRAN) IV, oxyCODONE, sodium chloride, sodium chloride, traMADol  Xrays Dg  Chest 2 View  02/25/2014   CLINICAL DATA:  Post CABG, hypertension, atrial fibrillation  EXAM: CHEST  2 VIEW  COMPARISON:  02/21/2014  FINDINGS: New RIGHT arm PICC line tip projects over SVC.  Enlargement of cardiac silhouette post CABG.  Mediastinal contours and pulmonary vascularity normal.  Emphysematous changes with bibasilar atelectasis and small effusions greater on LEFT.  Remaining lungs clear.  No pleural effusion or pneumothorax.  Epicardial pacing wires again noted.  IMPRESSION: Mild enlargement of cardiac silhouette post CABG.  COPD changes with bibasilar atelectasis and small pleural effusions, greater on LEFT.   Electronically Signed   By: Ulyses SouthwardMark  Boles M.D.   On: 02/25/2014 08:00    Assessment/Plan: S/P Procedure(s) (LRB): CORONARY ARTERY BYPASS GRAFTING (CABG)X4 LIMA-LAD; SVG-RCA(ENDARDERECTOMY); SVG-OM; SVG-DIAG (N/A) TRANSESOPHAGEAL ECHOCARDIOGRAM (TEE)  (N/A)  1 cdiff conts to improve- changed to flagyl and iv vanco 2 intermit afib on current rx 3 increasing leukocytosis 5 H/H stable 6 creat stable 7 INR increasing on current coumadin dose  LOS: 21 days    Gregory Davila,Gregory Davila 02/25/2014 DC EPWs patient examined and medical record reviewed,agree with above note. VAN TRIGT III,Kyen Taite 02/26/2014

## 2014-02-26 ENCOUNTER — Encounter (HOSPITAL_COMMUNITY): Payer: Self-pay | Admitting: General Practice

## 2014-02-26 DIAGNOSIS — I255 Ischemic cardiomyopathy: Secondary | ICD-10-CM

## 2014-02-26 LAB — CBC
HCT: 29.1 % — ABNORMAL LOW (ref 39.0–52.0)
Hemoglobin: 9.2 g/dL — ABNORMAL LOW (ref 13.0–17.0)
MCH: 29.1 pg (ref 26.0–34.0)
MCHC: 31.6 g/dL (ref 30.0–36.0)
MCV: 92.1 fL (ref 78.0–100.0)
Platelets: 491 10*3/uL — ABNORMAL HIGH (ref 150–400)
RBC: 3.16 MIL/uL — ABNORMAL LOW (ref 4.22–5.81)
RDW: 15.5 % (ref 11.5–15.5)
WBC: 16.9 10*3/uL — ABNORMAL HIGH (ref 4.0–10.5)

## 2014-02-26 LAB — BASIC METABOLIC PANEL
Anion gap: 7 (ref 5–15)
BUN: 19 mg/dL (ref 6–23)
CO2: 29 mmol/L (ref 19–32)
Calcium: 8.2 mg/dL — ABNORMAL LOW (ref 8.4–10.5)
Chloride: 100 mmol/L (ref 96–112)
Creatinine, Ser: 1.6 mg/dL — ABNORMAL HIGH (ref 0.50–1.35)
GFR calc Af Amer: 48 mL/min — ABNORMAL LOW (ref >90)
GFR calc non Af Amer: 41 mL/min — ABNORMAL LOW (ref >90)
Glucose, Bld: 90 mg/dL (ref 70–99)
Potassium: 3.3 mmol/L — ABNORMAL LOW (ref 3.5–5.1)
Sodium: 136 mmol/L (ref 135–145)

## 2014-02-26 LAB — PROTIME-INR
INR: 2.16 — ABNORMAL HIGH (ref 0.00–1.49)
Prothrombin Time: 24.3 seconds — ABNORMAL HIGH (ref 11.6–15.2)

## 2014-02-26 LAB — MAGNESIUM: Magnesium: 1.7 mg/dL (ref 1.5–2.5)

## 2014-02-26 MED ORDER — DILTIAZEM HCL 100 MG IV SOLR
5.0000 mg/h | INTRAVENOUS | Status: DC
  Filled 2014-02-26: qty 100

## 2014-02-26 MED ORDER — DILTIAZEM HCL ER COATED BEADS 240 MG PO CP24
240.0000 mg | ORAL_CAPSULE | Freq: Every day | ORAL | Status: DC
  Administered 2014-02-27 – 2014-02-28 (×2): 240 mg via ORAL
  Filled 2014-02-26 (×4): qty 1

## 2014-02-26 MED ORDER — POTASSIUM CHLORIDE 10 MEQ/50ML IV SOLN
10.0000 meq | Freq: Every morning | INTRAVENOUS | Status: DC
  Administered 2014-02-26: 10 meq via INTRAVENOUS
  Filled 2014-02-26 (×2): qty 50

## 2014-02-26 MED ORDER — LEVALBUTEROL HCL 1.25 MG/0.5ML IN NEBU
1.2500 mg | INHALATION_SOLUTION | Freq: Four times a day (QID) | RESPIRATORY_TRACT | Status: DC | PRN
  Filled 2014-02-26: qty 0.5

## 2014-02-26 MED ORDER — CARVEDILOL 12.5 MG PO TABS
12.5000 mg | ORAL_TABLET | Freq: Two times a day (BID) | ORAL | Status: DC
  Administered 2014-02-26 – 2014-03-01 (×3): 12.5 mg via ORAL
  Filled 2014-02-26 (×10): qty 1

## 2014-02-26 MED ORDER — POTASSIUM CHLORIDE 10 MEQ/50ML IV SOLN
10.0000 meq | INTRAVENOUS | Status: DC
  Filled 2014-02-26 (×2): qty 50

## 2014-02-26 NOTE — Progress Notes (Signed)
Utilization review completed.  

## 2014-02-26 NOTE — Progress Notes (Signed)
Pt ambulated 700 ft in hallway on room air. Pt tolerated activity well. Will continue to monitor.

## 2014-02-26 NOTE — Progress Notes (Addendum)
[address]       [place] [id number]             [phone]          12 Days Post-Op Procedure(s) (LRB): CORONARY ARTERY BYPASS GRAFTING (CABG)X4 LIMA-LAD; SVG-RCA(ENDARDERECTOMY); SVG-OM; SVG-DIAG (N/A) TRANSESOPHAGEAL ECHOCARDIOGRAM (TEE) (N/A)  Subjective: Back in AF, mainly rate controlled. Stools normalizing, appetite ok.  Breathing stable.   Objective: Vital signs in last 24 hours: Patient Vitals for the past 24 hrs:  BP Temp Temp src Pulse Resp SpO2 Weight  02/26/14 0340 117/74 mmHg 97.8 F (36.6 C) Oral 76 19 92 % 224 lb 3.3 oz (101.7 kg)  02/25/14 2236 106/72 mmHg 99.1 F (37.3 C) Oral (!) 102 18 95 % -  02/25/14 2045 - - - (!) 104 18 - -  02/25/14 1427 - - - - - 95 % -  02/25/14 1332 97/64 mmHg 98.4 F (36.9 C) Oral 96 17 94 % -  02/25/14 1200 124/79 mmHg - - 79 18 - -  02/25/14 1130 111/63 mmHg - - 84 18 - -  02/25/14 1115 99/71 mmHg - - 79 18 - -  02/25/14 1101 106/71 mmHg - - 79 - - -  02/25/14 1050 99/86 mmHg - - 94 - 95 % -  02/25/14 0958 102/61 mmHg - - 84 - - -  02/25/14 0847 - - - - - 95 % -   Current Weight  02/26/14 224 lb 3.3 oz (101.7 kg)  PRE-OPERATIVE WEIGHT: 103kg   Intake/Output from previous day: 02/22 0701 - 02/23 0700 In: 705 [P.O.:685; I.V.:20] Out: 500 [Urine:500]    PHYSICAL EXAM:  Heart: Irr irr  Lungs: Clear Wound: Sternal wound with small amount serous drainage from lower aspect, no erythema or purulence.  Leg wounds clean and dry. Extremities: Mild LE edema    Lab Results: CBC: Recent Labs  02/25/14 0535 02/26/14 0457  WBC 20.1* 16.9*  HGB 9.3* 9.2*  HCT 29.3* 29.1*  PLT 534* 491*   BMET:  Recent Labs  02/25/14 0535 02/26/14 0457  NA 136 136  K 3.3* 3.3*  CL 99 100  CO2 30 29  GLUCOSE 94 90  BUN 21 19  CREATININE 1.64* 1.60*  CALCIUM 8.1* 8.2*    PT/INR:  Recent Labs  02/26/14 0457  LABPROT 24.3*  INR 2.16*      Assessment/Plan: S/P Procedure(s)  (LRB): CORONARY ARTERY BYPASS GRAFTING (CABG)X4 LIMA-LAD; SVG-RCA(ENDARDERECTOMY); SVG-OM; SVG-DIAG (N/A) TRANSESOPHAGEAL ECHOCARDIOGRAM (TEE) (N/A)  CV- Presently maintaining AF, rates generally controlled. Will change Cardizem to IV x 6 hrs, Continue low dose Amio (decreased due to prolonged QT), po Lopressor, Coumadin. Since he continues to have rhythm issues, will consult EP for further medication recommendations.  Continue Plavix for coronary endarterectomy. BPs low normal but stable.    Vol overload- continue gentle diuresis.  A/CRF- Cr stable.  Hypokalemia- pt not digesting po K, so will supplement with IV runs of K+ today.  C diff colitis- Symptomatically improving. Continue IV Vanc/Flagyl.  Leukocytosis- WBC trending down. Sternal drainage decreasing, no fever or gross purulence. Continue to monitor.  All cultures negative so far.  Expected postop blood loss anemia- H/H stable.  CRPI, pulm toilet.   LOS: 22 days    [name],[name] 02/26/2014  Overall improving but afib has been persistent- will call cardiology The patient was examined and preop studies reviewed. There has been no change from the prior exam and the patient is  ready for surgery.

## 2014-02-26 NOTE — Progress Notes (Signed)
CARDIAC REHAB PHASE I   PRE:  Rate/Rhythm: 81 SR  BP:  Supine:   Sitting: 100/60  Standing:    SaO2: 94%RA  MODE:  Ambulation: 700 ft   POST:  Rate/Rhythm: 88 SR  BP:  Supine: 124/76  Sitting:   Standing:    SaO2: 96%RA 1034-1107 Pt walked 700 ft on RA with rolling walker with steady gait. Tolerated well. Did not have to stop to rest. Tolerated increase in distance well. Remained in NSR. To bed after walk for IV dressing.   Luetta Nuttingharlene Tremaine Fuhriman, RN BSN  02/26/2014 11:04 AM

## 2014-02-26 NOTE — Progress Notes (Signed)
Pt ambulated in hallway 550 ft with rolling walker on room air. Pt tolerated activity well. Will continue to monitor.

## 2014-02-26 NOTE — Progress Notes (Signed)
Patient Profile: 74 y/o male with history of HTN, PAF and CAD, day 12 s/p CABG x 4 (LIMA-LAD, SVG-RCA (endarterectomy), SVG-OM and SVG-Diag). EF 40-45% on TEE. Post op course complicated by c-diff and atrial fibrillation.    Subjective: No complaints currently. When he goes into afib, he gets dizzy but denies syncope/ near syncope and dyspnea.   Objective: Vital signs in last 24 hours: Temp:  [97.8 F (36.6 C)-99.1 F (37.3 C)] 97.8 F (36.6 C) (02/23 0340) Pulse Rate:  [76-104] 76 (02/23 0340) Resp:  [17-19] 19 (02/23 0340) BP: (97-124)/(61-86) 117/74 mmHg (02/23 0340) SpO2:  [92 %-95 %] 92 % (02/23 0340) Weight:  [224 lb 3.3 oz (101.7 kg)] 224 lb 3.3 oz (101.7 kg) (02/23 0340) Last BM Date: 02/25/14  Intake/Output from previous day: 02/22 0701 - 02/23 0700 In: 705 [P.O.:685; I.V.:20] Out: 500 [Urine:500] Intake/Output this shift:    Medications Current Facility-Administered Medications  Medication Dose Route Frequency Provider Last Rate Last Dose  . 0.45 % sodium chloride infusion   Intravenous Continuous Wayne E Gold, PA-C 20 mL/hr at 02/15/14 0300    . 0.9 %  sodium chloride infusion  250 mL Intravenous Continuous Wayne E Gold, PA-C   0 mL at 02/15/14 0600  . 0.9 %  sodium chloride infusion   Intravenous Continuous Rowe ClackWayne E Gold, PA-C 10 mL/hr at 02/22/14 1400    . allopurinol (ZYLOPRIM) tablet 150 mg  150 mg Oral Daily Kerin PernaPeter Van Trigt, MD   150 mg at 02/25/14 1002  . amiodarone (PACERONE) tablet 100 mg  100 mg Oral Daily Kerin PernaPeter Van Trigt, MD   100 mg at 02/25/14 1002  . atorvastatin (LIPITOR) tablet 40 mg  40 mg Oral q1800 Laurey Moralealton S McLean, MD   40 mg at 02/25/14 1855  . bisacodyl (DULCOLAX) EC tablet 10 mg  10 mg Oral Daily Rowe ClackWayne E Gold, PA-C   10 mg at 02/20/14 [id number]   Or  . bisacodyl (DULCOLAX) suppository 10 mg  10 mg Rectal Daily Wayne E Gold, PA-C      . clopidogrel (PLAVIX) tablet 75 mg  75 mg Oral Daily Kerin PernaPeter Van Trigt, MD   75 mg at 02/25/14 1002  . colchicine  tablet 0.6 mg  0.6 mg Oral Daily Kerin PernaPeter Van Trigt, MD   0.6 mg at 02/25/14 1002  . digoxin (LANOXIN) tablet 0.125 mg  0.125 mg Oral Daily Kerin PernaPeter Van Trigt, MD   0.125 mg at 02/25/14 1002  . diltiazem (CARDIZEM CD) 24 hr capsule 300 mg  300 mg Oral Daily Kerin PernaPeter Van Trigt, MD   300 mg at 02/25/14 1002  . diltiazem (CARDIZEM) 100 mg in dextrose 5 % 100 mL (1 mg/mL) infusion  5 mg/hr Intravenous Titrated Kerin PernaPeter Van Trigt, MD      . docusate sodium (COLACE) capsule 200 mg  200 mg Oral Daily Rowe ClackWayne E Gold, PA-C   200 mg at 02/20/14 [id number]  . furosemide (LASIX) tablet 40 mg  40 mg Oral Daily Kerin PernaPeter Van Trigt, MD   40 mg at 02/25/14 1001  . lactated ringers infusion   Intravenous Continuous Rowe ClackWayne E Gold, PA-C 10 mL/hr at 02/16/14 1800    . levalbuterol (XOPENEX) nebulizer solution 1.25 mg  1.25 mg Nebulization TID Kerin PernaPeter Van Trigt, MD   1.25 mg at 02/25/14 2045  . metoprolol (LOPRESSOR) injection 2.5-5 mg  2.5-5 mg Intravenous Q2H PRN Rowe ClackWayne E Gold, PA-C   5 mg at 02/17/14 2021  . metoprolol tartrate (LOPRESSOR) tablet 25 mg  25 mg Oral BID Kerin Perna, MD   25 mg at 02/25/14 2203  . metroNIDAZOLE (FLAGYL) tablet 500 mg  500 mg Oral Q8H Kerin Perna, MD   500 mg at 02/26/14 0351  . ondansetron (ZOFRAN) injection 4 mg  4 mg Intravenous Q6H PRN Rowe Clack, PA-C   4 mg at 02/20/14 2015  . oxyCODONE (Oxy IR/ROXICODONE) immediate release tablet 5-10 mg  5-10 mg Oral Q3H PRN Rowe Clack, PA-C   5 mg at 02/16/14 2245  . pantoprazole (PROTONIX) EC tablet 40 mg  40 mg Oral Daily Wayne E Gold, PA-C   40 mg at 02/25/14 1002  . potassium chloride 10 mEq in 50 mL *CENTRAL LINE* IVPB  10 mEq Intravenous q morning - 10a Kerin Perna, MD      . saccharomyces boulardii [facility]) capsule 250 mg  250 mg Oral BID Wilmon Pali, PA-C   250 mg at 02/25/14 2204  . simethicone (MYLICON) chewable tablet 80 mg  80 mg Oral QID Kerin Perna, MD   80 mg at 02/25/14 2204  . sodium chloride 0.9 % injection 10-40 mL  10-40 mL  Intracatheter Q12H Kerin Perna, MD   10 mL at 02/22/14 2024  . sodium chloride 0.9 % injection 10-40 mL  10-40 mL Intracatheter PRN Kerin Perna, MD   20 mL at 02/25/14 1754  . sodium chloride 0.9 % injection 3 mL  3 mL Intravenous Q12H Wayne E Gold, PA-C   3 mL at 02/21/14 2200  . sodium chloride 0.9 % injection 3 mL  3 mL Intravenous PRN Rowe Clack, PA-C   3 mL at 02/17/14 0759  . traMADol (ULTRAM) tablet 50-100 mg  50-100 mg Oral Q4H PRN Rowe Clack, PA-C   100 mg at 02/26/14 0129  . vancomycin (VANCOCIN) 1,250 mg in sodium chloride 0.9 % 250 mL IVPB  1,250 mg Intravenous Q24H Drake Leach Rumbarger, RPH   1,250 mg at 02/25/14 1006  . warfarin (COUMADIN) tablet 5 mg  5 mg Oral q1800 Alleen Borne, MD   5 mg at 02/25/14 1855  . Warfarin - Physician Dosing Inpatient   Does not apply q1800 Colleen Can, [facility]        PE: General appearance: alert, cooperative and no distress Neck: no carotid bruit and no JVD Lungs: clear to auscultation bilaterally Heart: regular rate and rhythm, S1, S2 normal, no murmur, click, rub or gallop Extremities: no LEE Pulses: 2+ and symmetric Skin: warm and dry Neurologic: Grossly normal  Lab Results:   Recent Labs  02/24/14 0615 02/25/14 0535 02/26/14 0457  WBC 18.9* 20.1* 16.9*  HGB 9.2* 9.3* 9.2*  HCT 29.0* 29.3* 29.1*  PLT 527* 534* 491*   BMET  Recent Labs  02/24/14 0615 02/25/14 0535 02/26/14 0457  NA 136 136 136  K 3.4* 3.3* 3.3*  CL 99 99 100  CO2 33* 30 29  GLUCOSE 90 94 90  BUN CREATININE 1.66* 1.64* 1.60*  CALCIUM 8.2* 8.1* 8.2*   PT/INR  Recent Labs  02/24/14 0615 02/25/14 0535 02/26/14 0457  LABPROT 21.0* 22.8* 24.3*  INR 1.79* 1.99* 2.16*     Assessment/Plan  Principal Problem:   Coronary atherosclerosis of native coronary artery Active Problems:   Paroxysmal atrial fibrillation   Hypertension   Renal insufficiency   Atrial fibrillation with RVR   Unstable angina   S/P CABG x  4  1. CAD: s/p  CABG x 4. Stable. Continue post-operative care per surgery.  2. Atrial Fibrillation: He is currently in NSR with HR in the 80s. I've discussed case with RN. His PAF seems to have a trend. He is mostly in NSR with a controlled HR while resting, however with ambulation he goes back into atrial fib with RVR. Current meds include 100 mg PO Amiodarone daily, 300 mg PO Cardizem, 25 mg Lopressor BID and 0.125 mg of digoxin.  Im not convinced that IV Cardizem will be of any benefit since he has PAF, mostly with ambulation. He needs further adjustment of PO meds. His Amiodarone was reduced down to low dose due to prolonged QT. He cannot tolerate higher doses.  He is already on a fairly high dose of Cardizem. BP is stable. Can further increase Lopressor. If we can drop his resting rate down from 80 to 60, this will hopefully reduce risk of afib while ambulating.    LOS: 22 days    Brittainy M. Delmer Islam 02/26/2014 8:42 AM  I have personally seen and examined this patient with Robbie Lis, PA-C. I agree with the assessment and plan as outlined above. He has had paroxysmal atrial fibrillation following his surgery. Amiodarone dose reduced due to prolonged QTc on 02/22/14. He is currently on amiodarone 100 mg daily, Cardizem CD 300 mg daily, Lopressor 25 mg po BID and Digoxin 0.125 mg daily. He is in sinus currently. Options are limited regarding change in his current regimen. INR is therapeutic on coumadin. - I would not increase amiodarone given prolonged QTc seen over last week. Will order EKG this am.  - Blood pressure has been 97-117 systolic last 24 hours. Increasing metoprolol may not be the best option - Will ask EP team to see to discuss other therapeutic options  MCALHANY,CHRISTOPHER 02/26/2014 10:01 AM

## 2014-02-26 NOTE — Consult Note (Signed)
ELECTROPHYSIOLOGY CONSULT NOTE    Patient ID: Gregory Davila MRN: [id number], DOB/AGE: 73/18/1942 73 y.o.  Admit date: 02/04/2014 Date of Consult: 02/26/2014  Primary Physician: [name], MD Primary Cardiologist: Excell Seltzer Referring Physician: Sanjuana Kava  Reason for Consultation: AF  HPI:  Gregory Davila is a 74 y.o. male with a past medical history significant for atrial fibrillation (maintained on Amio and anticoagulated with Warfarin), CAD (s/p CABG this admission), hypertension, renal insufficiency, and prior PE.  He was admitted 2/1 with angina and recurrent atrial fibrillation that self terminated while at [facility].  He was transferred to [facility] and ruled in for NSTEMI. Catheterization demonstrated severe 3V CAD and he underwent CABG 02/15/14.  Post op he had AF with RVR and was placed on IV amiodarone and subsequently converted to po.  His QTc prolonged and his amiodarone was decreased to 100mg  daily.  He has since had recurrent atrial fibrillation that has been for the most part rate controlled.  When he has recurrent AF, he is relatively asymptomatic.  He is aware of palpitations but denies dizziness, syncope or pre-syncope.  EP has been asked to evaluate for treatment options.   Echocardiogram 02/09/14 demonstrated EF 40-45%, septal hypokinesis, grade 1 diastolic dysfunction, mild MR, LA 35.   INR now therapeutic, creat 1.6, TSH normal this admission.   He currently denies chest pain, shortness of breath, fevers or chills.  He still has diarrhea (C.diff positive this admission).  ROS is otherwise negative except as outlined above.   Past Medical History  Diagnosis Date  . Paroxysmal atrial fibrillation     a. On Coumadin therapy/amidarone. b. 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  [name] CAD (coronary artery disease)     a, 2011: occluded LAD with left to right collaterals and moderate disease in the right coronary artery and circumflex, EF 50%. b. NSTEMI  07/2011 in setting of AF-RVR, MV abnl, cath w/ LAD 100%, RCA mod-severe dz, small vessel, med rx  . Hypertension     long-standing  . Chronic renal insufficiency     followed by primary care physician  . Pulmonary embolism   . Gout   . Anginal pain      Surgical History:  Past Surgical History  Procedure Laterality Date  . Cardiac catheterization  2011 & 2013    2013 L main [place], LAD 100%, CFX 50%, RCA 80%, small vessel, med rx  . Nm myoview ltd  2013    Scar in the anteroseptal/periapical distribution with moderate peri-infarct ischemia, EF 43%  . Left heart catheterization with coronary angiogram N/A 02/08/2014    Procedure: LEFT HEART CATHETERIZATION WITH CORONARY ANGIOGRAM;  Surgeon: Micheline Chapman, MD;  Location: [facility] CATH LAB;  Service: Cardiovascular;  Laterality: N/A;  . Coronary artery bypass graft N/A 02/14/2014    Procedure: CORONARY ARTERY BYPASS GRAFTING (CABG)X4 LIMA-LAD; SVG-RCA(ENDARDERECTOMY); SVG-OM; SVG-DIAG;  Surgeon: Kerin Perna, MD;  Location: [facility] OR;  Service: Open Heart Surgery;  Laterality: N/A;  . Tee without cardioversion N/A 02/14/2014    Procedure: TRANSESOPHAGEAL ECHOCARDIOGRAM (TEE);  Surgeon: Kerin Perna, MD;  Location: [facility] OR;  Service: Open Heart Surgery;  Laterality: N/A;     Prescriptions prior to admission  Medication Sig Dispense Refill Last Dose  . amiodarone (PACERONE) 200 MG tablet TAKE (1/2) TABLET BY MOUTH DAILY. 15 tablet 2 02/03/2014 at Unknown time  . aspirin 81 MG EC tablet Take 1 tablet (81 mg total) by mouth daily.   02/03/2014  at Unknown time  . COLCRYS 0.6 MG tablet Take 1 tablet by mouth 2 (two) times daily as needed. For gout   Past Week at Unknown time  . diltiazem (CARDIZEM CD) 300 MG 24 hr capsule TAKE 1 CAPSULE DAILY. 90 capsule 3 02/04/2014 at Unknown time  . isosorbide mononitrate (IMDUR) 30 MG 24 hr tablet TAKE 1 TABLET DAILY. 90 tablet 3 Past Week at Unknown time  . loratadine (CLARITIN) 10 MG tablet Take 10 mg by mouth daily.    Past Week at Unknown time  . metoprolol tartrate (LOPRESSOR) 25 MG tablet TAKE (1/2) TABLET BY MOUTH TWICE DAILY. 90 tablet 3 02/04/2014 at 0800  . multivitamin (THERAGRAN) per tablet Take 1 tablet by mouth daily.     Past Week at Unknown time  . pravastatin (PRAVACHOL) 20 MG tablet Take 1 tablet (20 mg total) by mouth at bedtime. 30 tablet 6 Past Week at Unknown time  . warfarin (COUMADIN) 5 MG tablet Take 1 tablet (5 mg total) by mouth as directed. (Patient taking differently: Take 5 mg by mouth daily. ) 40 tablet 4 Past Week at Unknown time  . nitroGLYCERIN (NITROSTAT) 0.4 MG SL tablet Place 1 tablet (0.4 mg total) under the tongue every 5 (five) minutes x 3 doses as needed for chest pain. 25 tablet 2 unknown at unknown    Inpatient Medications:  . allopurinol  150 mg Oral Daily  . amiodarone  100 mg Oral Daily  . atorvastatin  40 mg Oral q1800  . bisacodyl  10 mg Oral Daily   Or  . bisacodyl  10 mg Rectal Daily  . clopidogrel  75 mg Oral Daily  . colchicine  0.6 mg Oral Daily  . digoxin  0.125 mg Oral Daily  . diltiazem  300 mg Oral Daily  . docusate sodium  200 mg Oral Daily  . furosemide  40 mg Oral Daily  . metoprolol tartrate  25 mg Oral BID  . metroNIDAZOLE  500 mg Oral Q8H  . pantoprazole  40 mg Oral Daily  . potassium chloride  10 mEq Intravenous q morning - 10a  . saccharomyces boulardii  250 mg Oral BID  . simethicone  80 mg Oral QID  . sodium chloride  10-40 mL Intracatheter Q12H  . sodium chloride  3 mL Intravenous Q12H  . vancomycin  1,250 mg Intravenous Q24H  . warfarin  5 mg Oral q1800  . Warfarin - Physician Dosing Inpatient   Does not apply q1800    Allergies: No Known Allergies  History   Social History  . Marital Status: Married    Spouse Name: N/A  . Number of Children: N/A  . Years of Education: N/A   Occupational History  . Retired    Social History Main Topics  . Smoking status: Former Smoker    Types: Cigarettes    Quit date: 01/04/1966  .  Smokeless tobacco: Former Neurosurgeon    Types: Chew    Quit date: 01/05/2007     Comment: quit chewing tobacco about 4 yrs ago  . Alcohol Use: Yes     Comment: "seldomly drink beer" 1 every 2-3 weeks  . Drug Use: No  . Sexual Activity: Yes   Other Topics Concern  . Not on file   Social History Narrative     Family History  Problem Relation Age of Onset  . Coronary artery disease Brother     MI    BP 100/60 mmHg  Pulse 82  Temp(Src) 97.8 F (36.6 C) (Oral)  Resp 19  Ht 5\' 11"  (1.803 m)  Wt 224 lb 3.3 oz (101.7 kg)  BMI 31.28 kg/m2  SpO2 98% Physical Exam: Filed Vitals:   02/26/14 0951 02/26/14 1028 02/26/14 1029 02/26/14 1514  BP:   100/60 102/64  Pulse:  82  67  Temp:      TempSrc:      Resp:    20  Height:      Weight:      SpO2: 98%   93%    GEN- The patient is alert and oriented x 3 today.  NAD Head- normocephalic, atraumatic Eyes-  Sclera clear, conjunctiva pink Ears- hearing intact Oropharynx- clear Neck- supple, Lungs- Clear to ausculation bilaterally, normal work of breathing Heart- Regular rate and rhythm  GI- soft, NT, ND, + BS Extremities- no clubbing, cyanosis,+ dependant edema MS- no significant deformity or atrophy Skin- no rash or lesion Psych- euthymic mood, full affect Neuro- strength and sensation are intact    Labs:   Lab Results  Component Value Date   WBC 16.9* 02/26/2014   HGB 9.2* 02/26/2014   HCT 29.1* 02/26/2014   MCV 92.1 02/26/2014   PLT 491* 02/26/2014    Recent Labs Lab 02/26/14 0457  NA 136  K 3.3*  CL 100  CO2 29  BUN 19  CREATININE 1.60*  CALCIUM 8.2*  GLUCOSE 90     Radiology/Studies: Dg Chest 2 View 02/25/2014   CLINICAL DATA:  Post CABG, hypertension, atrial fibrillation  EXAM: CHEST  2 VIEW  COMPARISON:  02/21/2014  FINDINGS: New RIGHT arm PICC line tip projects over SVC.  Enlargement of cardiac silhouette post CABG.  Mediastinal contours and pulmonary vascularity normal.  Emphysematous changes with  bibasilar atelectasis and small effusions greater on LEFT.  Remaining lungs clear.  No pleural effusion or pneumothorax.  Epicardial pacing wires again noted.  IMPRESSION: Mild enlargement of cardiac silhouette post CABG.  COPD changes with bibasilar atelectasis and small pleural effusions, greater on LEFT.   Electronically Signed   By: Ulyses SouthwardMark  Boles M.D.   On: 02/25/2014 08:00   Ct Chest Wo Contrast 02/10/2014   CLINICAL DATA:  Chest pain, preop CABG  EXAM: CT CHEST WITHOUT CONTRAST  TECHNIQUE: Multidetector CT imaging of the chest was performed following the standard protocol without IV contrast.  COMPARISON:  Chest radiographs dated 02/06/2014  FINDINGS: Mediastinum/Nodes: The heart is normal in size. No pericardial effusion.  Coronary atherosclerosis in the left main coronary artery and LAD.  Mild atherosclerotic calcifications of the aortic arch.  No suspicious mediastinal or axillary lymphadenopathy.  Visualized thyroid is unremarkable.  Lungs/Pleura: Evaluation of the lung parenchyma is constrained by respiratory motion.  No suspicious pulmonary nodules, noting motion degradation.  Atelectasis/scarring in the bilateral lower lobes. Suspected emphysematous changes.  No pleural effusion or pneumothorax.  Upper abdomen: Visualized upper abdomen is notable for a stable 2.0 x 1.5 cm left adrenal adenoma (series 201/image 58), multiple nonobstructing bilateral renal calculi measuring up to 5 mm, and two dominant left renal cysts measuring up to 2.3 cm.  Musculoskeletal: Degenerative changes of the visualized thoracolumbar spine.  IMPRESSION: No evidence of acute cardiopulmonary disease.  Coronary atherosclerosis in the left main coronary artery and LAD.  Ancillary findings as above.   Electronically Signed   By: Charline BillsSriyesh  Krishnan M.D.   On: 02/10/2014 13:44   NWG:NFAOZEKG:sinus rhythm, rate 73, QTc 515  TELEMETRY: AF with controlled ventricular rate, intermittent sinus rhythm  A/P 1.  afib The patient has a known  history of paroxysmal afib for which he has chronically treated with amiodarone  daily.  His afib is stable, rate controlled, and without major symptoms.  He has been observed to have postoperative afib here.  He was initially treated with a higher dose of amiodarone which was decreased back to his home dose due to QT prolongation. Presently, he is comfortable.  He is appropriately anticoagulated.  His chads2vasc is at least 3.  He should therefore be anticoagulated long term.    2. Ischemic CM EF 40% Continue medical therapy Given newly depressed EF, I would favor stopping metoprolol and starting coreg 12.5mg  BID Wean diltiazem to  daily and continue to try to titrate coreg/ reduce diltiazem while here. He may benefit from an ace inhibitor if his creatinine remains stable Would be cautious with digoxin given reduced renal function   Electrophysiology team to see as needed while here. Please call with questions.

## 2014-02-27 LAB — BASIC METABOLIC PANEL
Anion gap: 5 (ref 5–15)
BUN: 20 mg/dL (ref 6–23)
CO2: 28 mmol/L (ref 19–32)
Calcium: 8.1 mg/dL — ABNORMAL LOW (ref 8.4–10.5)
Chloride: 102 mmol/L (ref 96–112)
Creatinine, Ser: 1.71 mg/dL — ABNORMAL HIGH (ref 0.50–1.35)
GFR calc Af Amer: 44 mL/min — ABNORMAL LOW (ref >90)
GFR calc non Af Amer: 38 mL/min — ABNORMAL LOW (ref >90)
Glucose, Bld: 107 mg/dL — ABNORMAL HIGH (ref 70–99)
Potassium: 3 mmol/L — ABNORMAL LOW (ref 3.5–5.1)
Sodium: 135 mmol/L (ref 135–145)

## 2014-02-27 LAB — CBC
HCT: 28.9 % — ABNORMAL LOW (ref 39.0–52.0)
Hemoglobin: 9.1 g/dL — ABNORMAL LOW (ref 13.0–17.0)
MCH: 28.6 pg (ref 26.0–34.0)
MCHC: 31.5 g/dL (ref 30.0–36.0)
MCV: 90.9 fL (ref 78.0–100.0)
Platelets: 514 10*3/uL — ABNORMAL HIGH (ref 150–400)
RBC: 3.18 MIL/uL — ABNORMAL LOW (ref 4.22–5.81)
RDW: 15.6 % — ABNORMAL HIGH (ref 11.5–15.5)
WBC: 14.1 10*3/uL — ABNORMAL HIGH (ref 4.0–10.5)

## 2014-02-27 LAB — PROTIME-INR
INR: 2.3 — ABNORMAL HIGH (ref 0.00–1.49)
Prothrombin Time: 25.5 seconds — ABNORMAL HIGH (ref 11.6–15.2)

## 2014-02-27 LAB — WOUND CULTURE
Culture: NO GROWTH
Gram Stain: NONE SEEN

## 2014-02-27 LAB — GLUCOSE, CAPILLARY: Glucose-Capillary: 85 mg/dL (ref 70–99)

## 2014-02-27 MED ORDER — POTASSIUM CHLORIDE CRYS ER 20 MEQ PO TBCR
40.0000 meq | EXTENDED_RELEASE_TABLET | Freq: Two times a day (BID) | ORAL | Status: AC
  Administered 2014-02-27 (×2): 40 meq via ORAL
  Filled 2014-02-27 (×2): qty 2

## 2014-02-27 MED ORDER — POTASSIUM CHLORIDE 10 MEQ/50ML IV SOLN
10.0000 meq | INTRAVENOUS | Status: AC
  Administered 2014-02-27 (×2): 10 meq via INTRAVENOUS
  Filled 2014-02-27 (×2): qty 50

## 2014-02-27 MED ORDER — WARFARIN SODIUM 2.5 MG PO TABS
2.5000 mg | ORAL_TABLET | Freq: Every day | ORAL | Status: DC
  Administered 2014-02-27: 2.5 mg via ORAL
  Filled 2014-02-27 (×2): qty 1

## 2014-02-27 MED ORDER — TRAMADOL HCL 50 MG PO TABS
50.0000 mg | ORAL_TABLET | ORAL | Status: DC | PRN
  Administered 2014-03-01 – 2014-03-03 (×2): 50 mg via ORAL
  Filled 2014-02-27 (×4): qty 1

## 2014-02-27 MED ORDER — SODIUM CHLORIDE 0.9 % IV BOLUS (SEPSIS)
250.0000 mL | Freq: Once | INTRAVENOUS | Status: AC
Start: 2014-02-27 — End: 2014-02-27
  Administered 2014-02-27: 250 mL via INTRAVENOUS

## 2014-02-27 MED ORDER — MAGNESIUM OXIDE 400 (241.3 MG) MG PO TABS
400.0000 mg | ORAL_TABLET | Freq: Two times a day (BID) | ORAL | Status: AC
  Administered 2014-02-27 – 2014-03-02 (×8): 400 mg via ORAL
  Filled 2014-02-27 (×8): qty 1

## 2014-02-27 MED ORDER — CIPROFLOXACIN IN D5W 200 MG/100ML IV SOLN
200.0000 mg | Freq: Two times a day (BID) | INTRAVENOUS | Status: DC
  Administered 2014-02-27 – 2014-02-28 (×4): 200 mg via INTRAVENOUS
  Filled 2014-02-27 (×5): qty 100

## 2014-02-27 NOTE — Progress Notes (Signed)
CARDIAC REHAB PHASE I   PRE:  Rate/Rhythm: 75 SR  BP:  Supine:   Sitting: 110/58  Standing: 96/54   SaO2: 94%RA  MODE:  Ambulation: 700 ft   POST:  Rate/Rhythm: 93 SR  BP:  Supine:   Sitting: 113/70  Standing:    SaO2: 96%RA 0948-1033 Pt walked 700 ft on RA with rolling walker and asst x 1. Denied any dizziness when up. Tolerated well. To recliner after walk. Remained in NSR.   Luetta Nuttingharlene Maya Arcand, RN BSN  02/27/2014 10:27 AM

## 2014-02-27 NOTE — Progress Notes (Signed)
Pt. Called out C/o nausea. The nurse brought zofran to administer and stood patient up to assess weight. As the pt was standing on the scale and reported feeling light headed.  Nurse assessed vs to find bp of 91/60 and a pulse of 88, O2 sat 93% on RA. Pt then reported he had been feeling anxious and dreaming strange things all evening which prevented him from sleeping. Nurse then assessed orthostatic BP and stood patient to assess BP. BP standing 68/42 with patient feeling light headed. Patient sat back in the chair and began to feel asymptomatic again.   patient in the chair, call bell in reach and verbalized understanding of nurses instruction to remain in the chair unless an RN or NT is with him.   Rn notified dr. Dorris FetchHendrickson.   Awaiting return call.  Will continue to monitor.   Gregory Davila, Cabe Lashley R

## 2014-02-27 NOTE — Progress Notes (Signed)
Dr. Dorris FetchHendrickson returned phone call. Ordered 250 NS bolus and to hold BP meds until CT can round on him this am.   Pt currently asymptomatic and remains in the chair.   Will continue to monitor. Edgardo RoysMcGrath, Balen Woolum R

## 2014-02-27 NOTE — Progress Notes (Addendum)
[address]       [place] [id number]             [phone]      13 Days Post-Op Procedure(s) (LRB): CORONARY ARTERY BYPASS GRAFTING (CABG)X4 LIMA-LAD; SVG-RCA(ENDARDERECTOMY); SVG-OM; SVG-DIAG (N/A) TRANSESOPHAGEAL ECHOCARDIOGRAM (TEE) (N/A) Subjective: Feels ok overall, some lower sternal drainage/ sternal mobility. Diarrhea conts to improve   Objective: Vital signs in last 24 hours: Temp:  [97.9 F (36.6 C)-98.3 F (36.8 C)] 97.9 F (36.6 C) (02/24 0517) Pulse Rate:  [67-88] 87 (02/24 0623) Cardiac Rhythm:  [-] Atrial fibrillation (02/24 0416) Resp:  [18-20] 18 (02/23 1955) BP: (68-110)/(42-69) 110/64 mmHg (02/24 0623) SpO2:  [92 %-98 %] 94 % (02/24 0428) Weight:  [221 lb (100.245 kg)] 221 lb (100.245 kg) (02/24 0416)  Hemodynamic parameters for last 24 hours:    Intake/Output from previous day: 02/23 0701 - 02/24 0700 In: 240 [P.O.:240] Out: 850 [Urine:850] Intake/Output this shift:    General appearance: alert, cooperative and no distress Heart: slightly irregular/tachy Lungs: dim in left base Abdomen: soft, nontender Extremities: mild edema Wound: incis healing well but sternal instability in lower pole with some drainage  Lab Results:  Recent Labs  02/26/14 0457 02/27/14 0525  WBC 16.9* 14.1*  HGB 9.2* 9.1*  HCT 29.1* 28.9*  PLT 491* 514*   BMET:  Recent Labs  02/26/14 0457 02/27/14 0525  NA 136 135  K 3.3* 3.0*  CL 100 102  CO2 29 28  GLUCOSE 90 107*  BUN 19 20  CREATININE 1.60* 1.71*  CALCIUM 8.2* 8.1*    PT/INR:  Recent Labs  02/27/14 0525  LABPROT 25.5*  INR 2.30*   ABG    Component Value Date/Time   PHART 7.356 02/15/2014 0402   HCO3 22.4 02/15/2014 0402   TCO2 24 02/15/2014 0402   ACIDBASEDEF 3.0* 02/15/2014 0402   O2SAT 94.0 02/15/2014 0402   CBG (last 3)  No results for input(s): GLUCAP in the last 72 hours.  Meds Scheduled Meds: . allopurinol  150 mg Oral Daily  . amiodarone  100 mg  Oral Daily  . atorvastatin  40 mg Oral q1800  . bisacodyl  10 mg Oral Daily   Or  . bisacodyl  10 mg Rectal Daily  . carvedilol  12.5 mg Oral BID WC  . clopidogrel  75 mg Oral Daily  . colchicine  0.6 mg Oral Daily  . diltiazem  240 mg Oral Daily  . docusate sodium  200 mg Oral Daily  . furosemide  40 mg Oral Daily  . metroNIDAZOLE  500 mg Oral Q8H  . pantoprazole  40 mg Oral Daily  . potassium chloride  10 mEq Intravenous q morning - 10a  . saccharomyces boulardii  250 mg Oral BID  . simethicone  80 mg Oral QID  . sodium chloride  10-40 mL Intracatheter Q12H  . sodium chloride  3 mL Intravenous Q12H  . vancomycin  1,250 mg Intravenous Q24H  . warfarin  5 mg Oral q1800  . Warfarin - Physician Dosing Inpatient   Does not apply q1800   Continuous Infusions: . sodium chloride 20 mL/hr at 02/15/14 0300  . sodium chloride Stopped (02/26/14 2154)  . sodium chloride 10 mL/hr at 02/22/14 1400  . lactated ringers 10 mL/hr at 02/16/14 1800   PRN Meds:.levalbuterol, metoprolol, ondansetron (ZOFRAN) IV, oxyCODONE, sodium chloride, sodium chloride, traMADol  Xrays No results found.  Assessment/Plan: S/P Procedure(s) (LRB): CORONARY ARTERY BYPASS GRAFTING (  CABG)X4 LIMA-LAD; SVG-RCA(ENDARDERECTOMY); SVG-OM; SVG-DIAG (N/A) TRANSESOPHAGEAL ECHOCARDIOGRAM (TEE) (N/A)  1 conts with afib. Having some polymorphic pvc's as well. Management as outlined per cardiology. Replace K+/magnesium 2 may be a bit intravasc dry- will hold lasix at this time with creat increasing 3 monitor sternum- cont vanco, leukocytosis is trending down 4 cdiff conts to symptomatically improve  LOS: 23 days    Gregory Davila,Gregory Davila 02/27/2014  afib intermittent- INR therapeutic, Cardiology following Urine with > 100k gram neg rods-- cover with Cipro Sternal drainage min on Iv vanco with WBC improved 20>>14k Not ready for DC because of symptomatic afib, sterna drainage, C diff  patient examined and medical record  reviewed,agree with above note. VAN TRIGT III,PETER 02/27/2014

## 2014-02-28 ENCOUNTER — Inpatient Hospital Stay (HOSPITAL_COMMUNITY): Payer: Medicare Other | Admitting: Certified Registered"

## 2014-02-28 ENCOUNTER — Inpatient Hospital Stay (HOSPITAL_COMMUNITY): Payer: Medicare Other

## 2014-02-28 ENCOUNTER — Encounter (HOSPITAL_COMMUNITY)
Admission: AD | Disposition: A | Discharge status: Home / Self Care | Payer: Self-pay | Source: Other Acute Inpatient Hospital | Attending: Cardiothoracic Surgery

## 2014-02-28 ENCOUNTER — Encounter (HOSPITAL_COMMUNITY): Payer: Self-pay | Admitting: Radiology

## 2014-02-28 DIAGNOSIS — I9789 Other postprocedural complications and disorders of the circulatory system, not elsewhere classified: Secondary | ICD-10-CM

## 2014-02-28 HISTORY — PX: STERNAL INCISION RECLOSURE: SHX2442

## 2014-02-28 LAB — URINE CULTURE: Colony Count: >=100000

## 2014-02-28 LAB — CBC
HCT: 26.9 % — ABNORMAL LOW (ref 39.0–52.0)
Hemoglobin: 8.4 g/dL — ABNORMAL LOW (ref 13.0–17.0)
MCH: 29.1 pg (ref 26.0–34.0)
MCHC: 31.2 g/dL (ref 30.0–36.0)
MCV: 93.1 fL (ref 78.0–100.0)
Platelets: 430 10*3/uL — ABNORMAL HIGH (ref 150–400)
RBC: 2.89 MIL/uL — ABNORMAL LOW (ref 4.22–5.81)
RDW: 15.8 % — ABNORMAL HIGH (ref 11.5–15.5)
WBC: 11.1 10*3/uL — ABNORMAL HIGH (ref 4.0–10.5)

## 2014-02-28 LAB — CBC WITH DIFFERENTIAL/PLATELET
Basophils Absolute: 0.1 10*3/uL (ref 0.0–0.1)
Basophils Relative: 0 % (ref 0–1)
Eosinophils Absolute: 0.7 10*3/uL (ref 0.0–0.7)
Eosinophils Relative: 6 % — ABNORMAL HIGH (ref 0–5)
HCT: 29.5 % — ABNORMAL LOW (ref 39.0–52.0)
Hemoglobin: 9.2 g/dL — ABNORMAL LOW (ref 13.0–17.0)
Lymphocytes Relative: 13 % (ref 12–46)
Lymphs Abs: 1.6 10*3/uL (ref 0.7–4.0)
MCH: 28.9 pg (ref 26.0–34.0)
MCHC: 31.2 g/dL (ref 30.0–36.0)
MCV: 92.8 fL (ref 78.0–100.0)
Monocytes Absolute: 0.9 10*3/uL (ref 0.1–1.0)
Monocytes Relative: 7 % (ref 3–12)
Neutro Abs: 9.2 10*3/uL — ABNORMAL HIGH (ref 1.7–7.7)
Neutrophils Relative %: 74 % (ref 43–77)
Platelets: 495 10*3/uL — ABNORMAL HIGH (ref 150–400)
RBC: 3.18 MIL/uL — ABNORMAL LOW (ref 4.22–5.81)
RDW: 15.7 % — ABNORMAL HIGH (ref 11.5–15.5)
WBC: 12.4 10*3/uL — ABNORMAL HIGH (ref 4.0–10.5)

## 2014-02-28 LAB — BASIC METABOLIC PANEL
Anion gap: 8 (ref 5–15)
BUN: 18 mg/dL (ref 6–23)
CO2: 24 mmol/L (ref 19–32)
Calcium: 8.1 mg/dL — ABNORMAL LOW (ref 8.4–10.5)
Chloride: 103 mmol/L (ref 96–112)
Creatinine, Ser: 1.67 mg/dL — ABNORMAL HIGH (ref 0.50–1.35)
GFR calc Af Amer: 45 mL/min — ABNORMAL LOW (ref >90)
GFR calc non Af Amer: 39 mL/min — ABNORMAL LOW (ref >90)
Glucose, Bld: 84 mg/dL (ref 70–99)
Potassium: 3.9 mmol/L (ref 3.5–5.1)
Sodium: 135 mmol/L (ref 135–145)

## 2014-02-28 LAB — PROTIME-INR
INR: 2.58 — ABNORMAL HIGH (ref 0.00–1.49)
Prothrombin Time: 27.9 seconds — ABNORMAL HIGH (ref 11.6–15.2)

## 2014-02-28 LAB — VANCOMYCIN, TROUGH: Vancomycin Tr: 11.8 ug/mL (ref 10.0–20.0)

## 2014-02-28 LAB — PREPARE RBC (CROSSMATCH)

## 2014-02-28 SURGERY — REWIRING, STERNUM
Anesthesia: General | Site: Chest

## 2014-02-28 MED ORDER — SODIUM CHLORIDE 0.9 % IJ SOLN
OROMUCOSAL | Status: DC | PRN
  Administered 2014-02-28 (×2): 4 mL via TOPICAL

## 2014-02-28 MED ORDER — HYDROMORPHONE HCL 1 MG/ML IJ SOLN
0.2500 mg | INTRAMUSCULAR | Status: DC | PRN
  Administered 2014-02-28: 0.25 mg via INTRAVENOUS
  Administered 2014-02-28: 0.5 mg via INTRAVENOUS

## 2014-02-28 MED ORDER — LIDOCAINE HCL (CARDIAC) 20 MG/ML IV SOLN
INTRAVENOUS | Status: DC | PRN
  Administered 2014-02-28: 100 mg via INTRAVENOUS

## 2014-02-28 MED ORDER — LACTATED RINGERS IV SOLN
INTRAVENOUS | Status: DC | PRN
  Administered 2014-02-28 (×3): via INTRAVENOUS

## 2014-02-28 MED ORDER — ENSURE COMPLETE PO LIQD
237.0000 mL | Freq: Three times a day (TID) | ORAL | Status: DC
Start: 2014-02-28 — End: 2014-03-05
  Administered 2014-03-02 – 2014-03-05 (×6): 237 mL via ORAL

## 2014-02-28 MED ORDER — FENTANYL CITRATE 0.05 MG/ML IJ SOLN
INTRAMUSCULAR | Status: DC | PRN
  Administered 2014-02-28: 150 ug via INTRAVENOUS

## 2014-02-28 MED ORDER — HYDROMORPHONE HCL 1 MG/ML IJ SOLN
INTRAMUSCULAR | Status: AC
  Filled 2014-02-28: qty 1

## 2014-02-28 MED ORDER — SODIUM CHLORIDE 0.9 % IR SOLN
Status: DC | PRN
  Administered 2014-02-28: 3000 mL

## 2014-02-28 MED ORDER — PROPOFOL 10 MG/ML IV BOLUS
INTRAVENOUS | Status: AC
  Filled 2014-02-28: qty 20

## 2014-02-28 MED ORDER — ONDANSETRON HCL 4 MG/2ML IJ SOLN
INTRAMUSCULAR | Status: DC | PRN
Start: 2014-02-28 — End: 2014-02-28
  Administered 2014-02-28: 4 mg via INTRAVENOUS

## 2014-02-28 MED ORDER — PHENYLEPHRINE HCL 10 MG/ML IJ SOLN
10.0000 mg | INTRAMUSCULAR | Status: DC | PRN
  Administered 2014-02-28: 50 ug/min via INTRAVENOUS

## 2014-02-28 MED ORDER — NEOSTIGMINE METHYLSULFATE 10 MG/10ML IV SOLN
INTRAVENOUS | Status: DC | PRN
  Administered 2014-02-28: 3 mg via INTRAVENOUS

## 2014-02-28 MED ORDER — EPHEDRINE SULFATE 50 MG/ML IJ SOLN
INTRAMUSCULAR | Status: DC | PRN
  Administered 2014-02-28 (×3): 10 mg via INTRAVENOUS
  Administered 2014-02-28: 20 mg via INTRAVENOUS

## 2014-02-28 MED ORDER — MIDAZOLAM HCL 2 MG/2ML IJ SOLN
INTRAMUSCULAR | Status: AC
  Filled 2014-02-28: qty 2

## 2014-02-28 MED ORDER — SUCCINYLCHOLINE CHLORIDE 20 MG/ML IJ SOLN
INTRAMUSCULAR | Status: DC | PRN
  Administered 2014-02-28: 120 mg via INTRAVENOUS

## 2014-02-28 MED ORDER — MIDAZOLAM HCL 5 MG/5ML IJ SOLN
INTRAMUSCULAR | Status: DC | PRN
  Administered 2014-02-28: 2 mg via INTRAVENOUS

## 2014-02-28 MED ORDER — GLYCOPYRROLATE 0.2 MG/ML IJ SOLN
INTRAMUSCULAR | Status: DC | PRN
  Administered 2014-02-28: .4 mg via INTRAVENOUS

## 2014-02-28 MED ORDER — ROCURONIUM BROMIDE 100 MG/10ML IV SOLN
INTRAVENOUS | Status: DC | PRN
  Administered 2014-02-28: 10 mg via INTRAVENOUS
  Administered 2014-02-28: 20 mg via INTRAVENOUS

## 2014-02-28 MED ORDER — SODIUM CHLORIDE 0.9 % IR SOLN
Status: DC | PRN
  Administered 2014-02-28: 500 mL

## 2014-02-28 MED ORDER — 0.9 % SODIUM CHLORIDE (POUR BTL) OPTIME
TOPICAL | Status: DC | PRN
  Administered 2014-02-28: 1000 mL

## 2014-02-28 MED ORDER — FENTANYL CITRATE 0.05 MG/ML IJ SOLN
INTRAMUSCULAR | Status: AC
  Filled 2014-02-28: qty 5

## 2014-02-28 MED ORDER — PROPOFOL 10 MG/ML IV BOLUS
INTRAVENOUS | Status: DC | PRN
  Administered 2014-02-28: 140 mg via INTRAVENOUS

## 2014-02-28 MED ORDER — ONDANSETRON HCL 4 MG/2ML IJ SOLN: 4.0000 mg | Freq: Once | INTRAMUSCULAR | Status: DC | PRN

## 2014-02-28 SURGICAL SUPPLY — 72 items
BAG DECANTER FOR FLEXI CONT (MISCELLANEOUS) ×4 IMPLANT
BENZOIN TINCTURE PRP APPL 2/3 (GAUZE/BANDAGES/DRESSINGS) IMPLANT
BLADE 11 SAFETY STRL DISP (BLADE) ×4 IMPLANT
BLADE SAW SAG 29X58X.64 (BLADE) IMPLANT
BLADE SURG 10 STRL SS (BLADE) IMPLANT
BLADE SURG 15 STRL LF DISP TIS (BLADE) IMPLANT
BLADE SURG 15 STRL SS (BLADE)
BNDG GAUZE ELAST 4 BULKY (GAUZE/BANDAGES/DRESSINGS) IMPLANT
CANISTER SUCTION 2500CC (MISCELLANEOUS) ×4 IMPLANT
CATH THORACIC 28FR RT ANG (CATHETERS) IMPLANT
CATH THORACIC 36FR (CATHETERS) IMPLANT
CATH THORACIC 36FR RT ANG (CATHETERS) IMPLANT
CLIP TI WIDE RED SMALL 24 (CLIP) IMPLANT
CONN Y 3/8X3/8X3/8  BEN (MISCELLANEOUS) ×2
CONN Y 3/8X3/8X3/8 BEN (MISCELLANEOUS) ×2 IMPLANT
CONT SPEC 4OZ CLIKSEAL STRL BL (MISCELLANEOUS) IMPLANT
COVER SURGICAL LIGHT HANDLE (MISCELLANEOUS) ×4 IMPLANT
DRAIN WOUND SNY 15 RND (WOUND CARE) ×4 IMPLANT
DRAPE LAPAROSCOPIC ABDOMINAL (DRAPES) ×4 IMPLANT
DRAPE SLUSH/WARMER DISC (DRAPES) IMPLANT
DRSG AQUACEL AG ADV 3.5X10 (GAUZE/BANDAGES/DRESSINGS) ×4 IMPLANT
DRSG AQUACEL AG ADV 3.5X14 (GAUZE/BANDAGES/DRESSINGS) ×4 IMPLANT
DRSG PAD ABDOMINAL 8X10 ST (GAUZE/BANDAGES/DRESSINGS) IMPLANT
ELECT REM PT RETURN 9FT ADLT (ELECTROSURGICAL) ×4
ELECTRODE REM PT RTRN 9FT ADLT (ELECTROSURGICAL) ×2 IMPLANT
EVACUATOR SILICONE 100CC (DRAIN) ×4 IMPLANT
GAUZE SPONGE 4X4 12PLY STRL (GAUZE/BANDAGES/DRESSINGS) ×4 IMPLANT
GAUZE XEROFORM 5X9 LF (GAUZE/BANDAGES/DRESSINGS) IMPLANT
GLOVE BIO SURGEON STRL SZ7.5 (GLOVE) ×8 IMPLANT
GLOVE BIOGEL PI IND STRL 6.5 (GLOVE) ×2 IMPLANT
GLOVE BIOGEL PI INDICATOR 6.5 (GLOVE) ×2
GOWN STRL REUS W/ TWL LRG LVL3 (GOWN DISPOSABLE) ×8 IMPLANT
GOWN STRL REUS W/TWL LRG LVL3 (GOWN DISPOSABLE) ×8
HANDPIECE INTERPULSE COAX TIP (DISPOSABLE) ×2
HEMOSTAT POWDER SURGIFOAM 1G (HEMOSTASIS) IMPLANT
HEMOSTAT SURGICEL 2X14 (HEMOSTASIS) IMPLANT
KIT BASIN OR (CUSTOM PROCEDURE TRAY) ×4 IMPLANT
KIT ROOM TURNOVER OR (KITS) ×4 IMPLANT
KIT SUCTION CATH 14FR (SUCTIONS) IMPLANT
NS IRRIG 1000ML POUR BTL (IV SOLUTION) ×4 IMPLANT
PACK CHEST (CUSTOM PROCEDURE TRAY) ×4 IMPLANT
PAD ARMBOARD 7.5X6 YLW CONV (MISCELLANEOUS) ×8 IMPLANT
PIN SAFETY STERILE (MISCELLANEOUS) IMPLANT
RUBBERBAND STERILE (MISCELLANEOUS) IMPLANT
SET HNDPC FAN SPRY TIP SCT (DISPOSABLE) ×2 IMPLANT
SPONGE GAUZE 4X4 12PLY STER LF (GAUZE/BANDAGES/DRESSINGS) ×4 IMPLANT
SPONGE LAP 18X18 X RAY DECT (DISPOSABLE) ×4 IMPLANT
STAPLER VISISTAT 35W (STAPLE) ×4 IMPLANT
STRAP MONTGOMERY 1.25X11-1/8 (MISCELLANEOUS) IMPLANT
SUT ETHILON 3 0 FSL (SUTURE) ×4 IMPLANT
SUT SILK  1 MH (SUTURE) ×2
SUT SILK 1 MH (SUTURE) ×2 IMPLANT
SUT STEEL 6MS V (SUTURE) IMPLANT
SUT STEEL STERNAL CCS#1 18IN (SUTURE) ×4 IMPLANT
SUT STEEL SZ 6 DBL 3X14 BALL (SUTURE) ×4 IMPLANT
SUT STERNA BAND STERNOTOMY (SUTURE) IMPLANT
SUT VIC AB 1 CTX 18 (SUTURE) ×8 IMPLANT
SUT VIC AB 1 CTX 36 (SUTURE) ×2
SUT VIC AB 1 CTX36XBRD ANBCTR (SUTURE) ×2 IMPLANT
SUT VIC AB 2-0 CTX 27 (SUTURE) ×8 IMPLANT
SUT VIC AB 3-0 SH 8-18 (SUTURE) ×4 IMPLANT
SUT VIC AB 3-0 X1 27 (SUTURE) ×8 IMPLANT
SWAB COLLECTION DEVICE MRSA (MISCELLANEOUS) ×4 IMPLANT
SYR 5ML LL (SYRINGE) IMPLANT
SYSTEM SAHARA CHEST DRAIN ATS (WOUND CARE) IMPLANT
TAPE CLOTH SURG 4X10 WHT LF (GAUZE/BANDAGES/DRESSINGS) ×4 IMPLANT
TOWEL OR 17X24 6PK STRL BLUE (TOWEL DISPOSABLE) ×4 IMPLANT
TOWEL OR 17X26 10 PK STRL BLUE (TOWEL DISPOSABLE) ×4 IMPLANT
TRAP SPECIMEN MUCOUS 40CC (MISCELLANEOUS) ×4 IMPLANT
TRAY FOLEY CATH 14FRSI W/METER (CATHETERS) IMPLANT
TUBE ANAEROBIC SPECIMEN COL (MISCELLANEOUS) ×4 IMPLANT
WATER STERILE IRR 1000ML POUR (IV SOLUTION) ×4 IMPLANT

## 2014-02-28 NOTE — Anesthesia Postprocedure Evaluation (Signed)
  Anesthesia Post-op Note  Patient: Gregory MapleRalph D Bartok  Procedure(s) Performed: Procedure(s): Irrigation and Debridement of Sternum with Sternal Rewiring (N/A)  Patient Location: PACU  Anesthesia Type:General  Level of Consciousness: awake, oriented, sedated and patient cooperative  Airway and Oxygen Therapy: Patient Spontanous Breathing  Post-op Pain: mild  Post-op Assessment: Post-op Vital signs reviewed, Patient's Cardiovascular Status Stable, Respiratory Function Stable, Patent Airway, No signs of Nausea or vomiting and Pain level controlled  Post-op Vital Signs: stable  Last Vitals:  Filed Vitals:   02/28/14 1512  BP: 104/68  Pulse: 110  Temp: 37 C  Resp: 16    Complications: No apparent anesthesia complications

## 2014-02-28 NOTE — Progress Notes (Addendum)
ANTIBIOTIC CONSULT NOTE   Pharmacy Consult for vancomycin Indication: empiric  No Known Allergies  Patient Measurements: Height:  (180.3 cm) Weight: 220 lb 14.4 oz (100.2 kg) IBW/kg (Calculated) : 75.3 Adjusted Body Weight:   Vital Signs: Temp: 98.6 F (37 C) (02/25 1512) Temp Source: Oral (02/25 1512) BP: 104/68 mmHg (02/25 1512) Pulse Rate: 110 (02/25 1512) Intake/Output from previous day: 02/24 0701 - 02/25 0700 In: [address] [P.O.:12280] Out: -  Intake/Output from this shift: Total I/O In: 1938 [I.V.:1300; Blood:638] Out: 25 [Drains:25]  Labs:  Recent Labs  02/26/14 0457 02/27/14 0525 02/28/14 0505  WBC 16.9* 14.1* 12.4*  HGB 9.2* 9.1* 9.2*  PLT 491* 514* 495*  CREATININE 1.60* 1.71* 1.67*   Estimated Creatinine Clearance: 47.5 mL/min (by C-G formula based on Cr of 1.67).  Recent Labs  02/28/14 0700  VANCOTROUGH 11.8     Microbiology: Recent Results (from the past 720 hour(s))  MRSA PCR Screening     Status: Abnormal   Collection Time: 02/04/14  4:50 PM  Result Value Ref Range Status   MRSA by PCR POSITIVE (A) NEGATIVE Final    Comment:        The GeneXpert MRSA Assay (FDA approved for NASAL specimens only), is one component of a comprehensive MRSA colonization surveillance program. It is not intended to diagnose MRSA infection nor to guide or monitor treatment for MRSA infections. RESULT CALLED TO, READ BACK BY AND VERIFIED WITH: M,MCNEIL,RN AT 2055 BY L.PITT 02/04/14   Surgical pcr screen     Status: Abnormal   Collection Time: 02/13/14 12:48 PM  Result Value Ref Range Status   MRSA, PCR POSITIVE (A) NEGATIVE Final   Staphylococcus aureus POSITIVE (A) NEGATIVE Final    Comment:        The Xpert SA Assay (FDA approved for NASAL specimens in patients over 20 years of age), is one component of a comprehensive surveillance program.  Test performance has been validated by [facility] for patients greater than or equal to 1 year  old. It is not intended to diagnose infection nor to guide or monitor treatment.   Clostridium Difficile by PCR     Status: Abnormal   Collection Time: 02/18/14  3:00 AM  Result Value Ref Range Status   C difficile by pcr POSITIVE (A) NEGATIVE Final    Comment: CRITICAL RESULT CALLED TO, READ BACK BY AND VERIFIED WITH: D. VERNON RN 9:45 02/18/14 ([email or web address])   Wound culture     Status: None   Collection Time: 02/24/14 12:15 PM  Result Value Ref Range Status   Specimen Description STERNUM  Final   Special Requests NONE  Final   Gram Stain   Final    NO WBC SEEN NO SQUAMOUS EPITHELIAL CELLS SEEN NO ORGANISMS SEEN Performed at [facility]    Culture   Final    NO GROWTH 2 DAYS Performed at [facility]    Report Status 02/27/2014 FINAL  Final  Culture, Urine     Status: None   Collection Time: 02/24/14  3:38 PM  Result Value Ref Range Status   Specimen Description URINE, CLEAN CATCH  Final   Special Requests A  Final   Colony Count   Final    >=100,000 COLONIES/ML Performed at [facility]    Culture   Final    KLEBSIELLA PNEUMONIAE PSEUDOMONAS AERUGINOSA Performed at [facility]    Report Status 02/28/2014 FINAL  Final   Organism ID, Bacteria KLEBSIELLA  PNEUMONIAE  Final   Organism ID, Bacteria PSEUDOMONAS AERUGINOSA  Final      Susceptibility   Klebsiella pneumoniae - MIC*    AMPICILLIN >=32 RESISTANT Resistant     CEFAZOLIN <=4 SENSITIVE Sensitive     CEFTRIAXONE <=1 SENSITIVE Sensitive     CIPROFLOXACIN <=0.25 SENSITIVE Sensitive     GENTAMICIN <=1 SENSITIVE Sensitive     LEVOFLOXACIN 1 SENSITIVE Sensitive     NITROFURANTOIN 64 INTERMEDIATE Intermediate     TOBRAMYCIN <=1 SENSITIVE Sensitive     TRIMETH/SULFA <=20 SENSITIVE Sensitive     PIP/TAZO 16 SENSITIVE Sensitive     * KLEBSIELLA PNEUMONIAE   Pseudomonas aeruginosa - MIC*    CEFEPIME 4 SENSITIVE Sensitive     CEFTAZIDIME 4 SENSITIVE Sensitive     CIPROFLOXACIN <=0.25  SENSITIVE Sensitive     GENTAMICIN 2 SENSITIVE Sensitive     IMIPENEM 2 SENSITIVE Sensitive     PIP/TAZO 8 SENSITIVE Sensitive     TOBRAMYCIN <=1 SENSITIVE Sensitive     * PSEUDOMONAS AERUGINOSA  Culture, blood (single)     Status: None (Preliminary result)   Collection Time: 02/25/14  9:00 AM  Result Value Ref Range Status   Specimen Description BLOOD LEFT HAND  Final   Special Requests BOTTLES DRAWN AEROBIC ONLY 8CC  Final   Culture   Final           BLOOD CULTURE RECEIVED NO GROWTH TO DATE CULTURE WILL BE HELD FOR 5 DAYS BEFORE ISSUING A FINAL NEGATIVE REPORT Performed at [facility]    Report Status PENDING  Incomplete    Medical History: Past Medical History  Diagnosis Date  . Paroxysmal atrial fibrillation     a. On Coumadin therapy/amidarone. b. 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  [name]. CAD (coronary artery disease)     a, 2011: occluded LAD with left to right collaterals and moderate disease in the right coronary artery and circumflex, EF 50%. b. NSTEMI 07/2011 in setting of AF-RVR, MV abnl, cath w/ LAD 100%, RCA mod-severe dz, small vessel, med rx  . Hypertension     long-standing  . Chronic renal insufficiency     followed by primary care physician  . Pulmonary embolism   . Gout   . Anginal pain   . Myocardial infarction     Medications:  Anti-infectives    Start     Dose/Rate Route Frequency Ordered Stop   02/28/14 1207  polymyxin B 500,000 Units, bacitracin 50,000 Units in sodium chloride irrigation 0.9 % 500 mL irrigation  Status:  Discontinued       As needed 02/28/14 1208 02/28/14 1353   02/27/14 1000  ciprofloxacin (CIPRO) IVPB 200 mg     200 mg 100 mL/hr over 60 Minutes Intravenous Every 12 hours 02/27/14 0856     02/25/14 1200  metroNIDAZOLE (FLAGYL) tablet 500 mg     500 mg Oral Every 8 hours 02/25/14 1107     02/25/14 0900  vancomycin (VANCOCIN) 1,250 mg in sodium chloride 0.9 % 250 mL IVPB     1,250 mg 166.7  mL/hr over 90 Minutes Intravenous Every 24 hours 02/25/14 0751     02/25/14 0830  metroNIDAZOLE (FLAGYL) tablet 500 mg  Status:  Discontinued     500 mg Oral Every 8 hours 02/25/14 0739 02/25/14 1107   02/25/14 0745  vancomycin (VANCOCIN) IVPB 1000 mg/200 mL premix  Status:  Discontinued     1,000 mg 200 mL/hr over  60 Minutes Intravenous Every 24 hours 02/25/14 0739 02/25/14 0743   02/18/14 1115  vancomycin (VANCOCIN) 50 mg/mL oral solution 125 mg  Status:  Discontinued     125 mg Oral 4 times daily 02/18/14 1059 02/25/14 0739   02/14/14 2015  vancomycin (VANCOCIN) IVPB 1000 mg/200 mL premix     1,000 mg 200 mL/hr over 60 Minutes Intravenous  Once 02/14/14 1425 02/14/14 2107   02/14/14 1700  cefUROXime (ZINACEF) 1.5 g in dextrose 5 % 50 mL IVPB     1.5 g 100 mL/hr over 30 Minutes Intravenous Every 12 hours 02/14/14 1425 02/16/14 0617   02/14/14 0400  vancomycin (VANCOCIN) 1,250 mg in sodium chloride 0.9 % 250 mL IVPB     1,250 mg 166.7 mL/hr over 90 Minutes Intravenous To Surgery 02/13/14 1225 02/14/14 0926   02/14/14 0400  cefUROXime (ZINACEF) 1.5 g in dextrose 5 % 50 mL IVPB     1.5 g 100 mL/hr over 30 Minutes Intravenous To Surgery 02/13/14 1225 02/14/14 1239   02/14/14 0400  cefUROXime (ZINACEF) 750 mg in dextrose 5 % 50 mL IVPB  Status:  Discontinued     750 mg 100 mL/hr over 30 Minutes Intravenous To Surgery 02/13/14 1225 02/14/14 1425     Assessment: 74 yom s/p CABG on 2/11 continues on empiric vancomycin for leukocytosis. Also on flagyl for treatment of Cdiff. Pt is afebrile and WBC is trending down to 12.4. Scr is elevated at 1.6 but has been stable. Patient started on cipro 2/24 for uti which is now growing kleb pneumo/pseudomonas both sensitive to cipro.  2/15 vanc PO>>2/22 2/22 vanc IV>> 2/22 flagyl PO>> 2/24 cipro>>  2/15 cdiff (+) 2/21 sternum wound cx>>ngtd 2/21 urine cx>>kleb pneumo/pseudomonas - sens to cipro 2/25 new sternal wound cx sent  Goal of Therapy:   Vancomycin trough level 15-20 mcg/ml  Plan:  1. Vanc  IV Q24H 2. Would recommend increasing cipro to  IV q12 hours 3. Check VT tomorrow 2/26  Sheppard Coil PharmD., BCPS Clinical Pharmacist Pager [phone] 02/28/2014 3:47 PM

## 2014-02-28 NOTE — Transfer of Care (Signed)
Immediate Anesthesia Transfer of Care Note  Patient: Gregory MapleRalph D Puerto  Procedure(s) Performed: Procedure(s): Irrigation and Debridement of Sternum with Sternal Rewiring (N/A)  Patient Location: PACU  Anesthesia Type:General  Level of Consciousness: awake, alert  and oriented  Airway & Oxygen Therapy: Patient Spontanous Breathing and Patient connected to nasal cannula oxygen  Post-op Assessment: Report given to RN and Post -op Vital signs reviewed and stable  Post vital signs: Reviewed and stable  Last Vitals:  Filed Vitals:   02/28/14 1016  BP: 131/79  Pulse: 104  Temp: 36.7 C  Resp: 18    Complications: No apparent anesthesia complications

## 2014-02-28 NOTE — Progress Notes (Signed)
Medicare Important Message given? YES  (If response is "NO", the following Medicare IM given date fields will be blank)  Date Medicare IM given: 02/28/14 Medicare IM given by:  Shanieka Blea  

## 2014-02-28 NOTE — Anesthesia Preprocedure Evaluation (Addendum)
Anesthesia Evaluation  Patient identified by MRN, date of birth, ID band Patient awake    Reviewed: Allergy & Precautions, NPO status , Patient's Chart, lab work & pertinent test results, reviewed documented beta blocker date and time   Airway Mallampati: II       Dental  (+) Partial Lower, Partial Upper, Dental Advisory Given, Poor Dentition   Pulmonary former smoker,          Cardiovascular hypertension, + angina with exertion + CAD, + Past MI and + CABG + dysrhythmias Atrial Fibrillation     Neuro/Psych    GI/Hepatic   Endo/Other    Renal/GU Renal InsufficiencyRenal disease     Musculoskeletal   Abdominal   Peds  Hematology   Anesthesia Other Findings   Reproductive/Obstetrics                           Anesthesia Physical Anesthesia Plan  ASA: IV  Anesthesia Plan: General   Post-op Pain Management:    Induction: Intravenous  Airway Management Planned: Oral ETT  Additional Equipment:   Intra-op Plan:   Post-operative Plan:   Informed Consent: I have reviewed the patients History and Physical, chart, labs and discussed the procedure including the risks, benefits and alternatives for the proposed anesthesia with the patient or authorized representative who has indicated his/her understanding and acceptance.   Dental advisory given  Plan Discussed with: CRNA, Anesthesiologist and Surgeon  Anesthesia Plan Comments:         Anesthesia Quick Evaluation

## 2014-02-28 NOTE — Progress Notes (Addendum)
[address]       [place] [address]             [phone]      14 Days Post-Op Procedure(s) (LRB): CORONARY ARTERY BYPASS GRAFTING (CABG)X4 LIMA-LAD; SVG-RCA(ENDARDERECTOMY); SVG-OM; SVG-DIAG (N/A) TRANSESOPHAGEAL ECHOCARDIOGRAM (TEE) (N/A) Subjective:  Feels ok, got some sleep  Objective: Vital signs in last 24 hours: Temp:  [97.6 F (36.4 C)-98.7 F (37.1 C)] 97.6 F (36.4 C) (02/24 2032) Pulse Rate:  [86-112] 112 (02/24 2032) Cardiac Rhythm:  [-] Atrial fibrillation (02/24 2313) Resp:  [18] 18 (02/24 2032) BP: (96-110)/(54-66) 96/64 mmHg (02/24 2032) Weight:  [220 lb 14.4 oz (100.2 kg)] 220 lb 14.4 oz (100.2 kg) (02/25 0500)  Hemodynamic parameters for last 24 hours:    Intake/Output from previous day: 02/24 0701 - 02/25 0700 In: 12280 [P.O.:12280] Out: -  Intake/Output this shift:    General appearance: alert, cooperative and no distress Heart: regular rate and rhythm Lungs: clear to auscultation bilaterally Abdomen: soft, non-tender; bowel sounds normal; no masses,  no organomegaly Extremities: min edema Wound: incis healing well except the sternum is clearly more unstable  Lab Results:  Recent Labs  02/27/14 0525 02/28/14 0505  WBC 14.1* 12.4*  HGB 9.1* 9.2*  HCT 28.9* 29.5*  PLT 514* 495*   BMET:  Recent Labs  02/27/14 0525 02/28/14 0505  NA 135 135  K 3.0* 3.9  CL 102 103  CO2 28 24  GLUCOSE 107* 84  BUN 20 18  CREATININE 1.71* 1.67*  CALCIUM 8.1* 8.1*    PT/INR:  Recent Labs  02/28/14 0505  LABPROT 27.9*  INR 2.58*   ABG    Component Value Date/Time   PHART 7.356 02/15/2014 0402   HCO3 22.4 02/15/2014 0402   TCO2 24 02/15/2014 0402   ACIDBASEDEF 3.0* 02/15/2014 0402   O2SAT 94.0 02/15/2014 0402   CBG (last 3)   Recent Labs  02/27/14 2103  GLUCAP 85    Meds Scheduled Meds: . allopurinol  150 mg Oral Daily  . amiodarone  100 mg Oral Daily  . atorvastatin  40 mg Oral q1800  . bisacodyl   10 mg Oral Daily   Or  . bisacodyl  10 mg Rectal Daily  . carvedilol  12.5 mg Oral BID WC  . ciprofloxacin  200 mg Intravenous Q12H  . clopidogrel  75 mg Oral Daily  . colchicine  0.6 mg Oral Daily  . diltiazem  240 mg Oral Daily  . docusate sodium  200 mg Oral Daily  . magnesium oxide  400 mg Oral BID  . metroNIDAZOLE  500 mg Oral Q8H  . pantoprazole  40 mg Oral Daily  . saccharomyces boulardii  250 mg Oral BID  . simethicone  80 mg Oral QID  . sodium chloride  10-40 mL Intracatheter Q12H  . sodium chloride  3 mL Intravenous Q12H  . vancomycin  1,250 mg Intravenous Q24H  . warfarin  2.5 mg Oral q1800  . Warfarin - Physician Dosing Inpatient   Does not apply q1800   Continuous Infusions: . sodium chloride 20 mL/hr at 02/15/14 0300  . sodium chloride Stopped (02/26/14 2154)  . sodium chloride 10 mL/hr at 02/22/14 1400  . lactated ringers 10 mL/hr at 02/16/14 1800   PRN Meds:.levalbuterol, metoprolol, ondansetron (ZOFRAN) IV, oxyCODONE, sodium chloride, sodium chloride, traMADol  Xrays No results found.  Assessment/Plan: S/P Procedure(s) (LRB): CORONARY ARTERY BYPASS GRAFTING (CABG)X4 LIMA-LAD; SVG-RCA(ENDARDERECTOMY); SVG-OM; SVG-DIAG (N/A) TRANSESOPHAGEAL  ECHOCARDIOGRAM (TEE) (N/A)  1 sternal instability is worse- will get CT scan this am as may have pulled wires through 2 conts with afib intermittently- coumadin is therapeudic so will need to be adressed if needs sternal rewire 3 conts current abx for cdiff/UTI/sternal dehiscence  LOS: 24 days    GOLD,WAYNE E 02/28/2014  Lower sternal wire has cut through the bome with separation of sternal edges and fluid collection- will rewire lower sternum ad place possible wound VAC today-- FFP ordered to reduce INR   Final urine cultures ID > 100k pseudomonas and > 100k  Klebsiella , both sensitive to Cipro which will continue  Cont po Flagyl for C diff  Cont Vanco for wound healing problems and + MRSA nasal swab patient  examined and medical record reviewed,agree with above note. VAN TRIGT III,Antony Sian 02/28/2014

## 2014-02-28 NOTE — Brief Op Note (Signed)
02/04/2014 - 02/28/2014  1:51 PM  PATIENT:  Loraine Maplealph D Welchel  74 y.o. male  PRE-OPERATIVE DIAGNOSIS:  STERNAL WIRE cut through lower  sternum  POST-OPERATIVE DIAGNOSIS:  STERNAL WIRE cut through lower sternum, lower sternal separation  PROCEDURE:  Procedure(s): Irrigation and Debridement of Sternum with Sternal Rewiring (N/A)  SURGEON:  Surgeon(s) and Role:    * Kerin PernaPeter Van Trigt, MD - Primary  PHYSICIAN ASSISTANT:   ASSISTANTS: D Wilson RNFA   ANESTHESIA:   general  EBL:  Total I/O In: 1638 [I.V.:1000; Blood:638] Out: -   BLOOD ADMINISTERED:none  [id number] F JP  LOCAL MEDICATIONS USED:  NONE  SPECIMEN:  No Specimen and Aspirate  DISPOSITION OF SPECIMEN:  microbiology  COUNTS:  YES  TOURNIQUE  DICTATION: .Dragon Dictation  PLAN OF CARE: Admit to inpatient  2 west 21  PATIENT DISPOSITION:  PACU - hemodynamically stable.   Delay start of Pharmacological VTE agent (>24hrs) due to surgical blood loss or risk of bleeding: yes - hold coumadin 24 hrs

## 2014-03-01 ENCOUNTER — Inpatient Hospital Stay (HOSPITAL_COMMUNITY): Payer: Medicare Other

## 2014-03-01 LAB — PREPARE FRESH FROZEN PLASMA
Unit division: 0
Unit division: 0

## 2014-03-01 LAB — PROTIME-INR
INR: 2.17 — ABNORMAL HIGH (ref 0.00–1.49)
Prothrombin Time: 24.3 seconds — ABNORMAL HIGH (ref 11.6–15.2)

## 2014-03-01 LAB — VANCOMYCIN, TROUGH: Vancomycin Tr: 11.8 ug/mL (ref 10.0–20.0)

## 2014-03-01 MED ORDER — WARFARIN - PHYSICIAN DOSING INPATIENT
Freq: Every day | Status: DC
  Administered 2014-03-01 – 2014-03-04 (×4)

## 2014-03-01 MED ORDER — VANCOMYCIN HCL 10 G IV SOLR
1500.0000 mg | INTRAVENOUS | Status: AC
  Administered 2014-03-02 – 2014-03-03 (×2): 1500 mg via INTRAVENOUS
  Filled 2014-03-01 (×2): qty 1500

## 2014-03-01 MED ORDER — CIPROFLOXACIN IN D5W 400 MG/200ML IV SOLN
400.0000 mg | Freq: Two times a day (BID) | INTRAVENOUS | Status: DC
  Administered 2014-03-01 – 2014-03-04 (×7): 400 mg via INTRAVENOUS
  Filled 2014-03-01 (×7): qty 200

## 2014-03-01 MED ORDER — VANCOMYCIN HCL 10 G IV SOLR
1750.0000 mg | INTRAVENOUS | Status: AC
  Administered 2014-03-01: 1750 mg via INTRAVENOUS
  Filled 2014-03-01 (×2): qty 1750

## 2014-03-01 MED ORDER — WARFARIN SODIUM 4 MG PO TABS
4.0000 mg | ORAL_TABLET | Freq: Every day | ORAL | Status: DC
  Administered 2014-03-01 – 2014-03-03 (×3): 4 mg via ORAL
  Filled 2014-03-01 (×4): qty 1

## 2014-03-01 MED ORDER — FUROSEMIDE 40 MG PO TABS
40.0000 mg | ORAL_TABLET | Freq: Every day | ORAL | Status: DC
  Administered 2014-03-01: 40 mg via ORAL
  Filled 2014-03-01 (×4): qty 1

## 2014-03-01 NOTE — Progress Notes (Signed)
[address]       [place] [id number]             [phone]      1 Day Post-Op Procedure(s) (LRB): Irrigation and Debridement of Sternum with Sternal Rewiring (N/A) Subjective: Feels pretty well, minor pain in sternal incision   Objective: Vital signs in last 24 hours: Temp:  [96.5 F (35.8 C)-98.6 F (37 C)] 98.3 F (36.8 C) (02/26 0416) Pulse Rate:  [86-116] 90 (02/26 0416) Cardiac Rhythm:  [-] Atrial fibrillation;Normal sinus rhythm (02/26 0026) Resp:  [12-18] 18 (02/26 0416) BP: (100-133)/(63-82) 133/82 mmHg (02/26 0416) SpO2:  [90 %-95 %] 90 % (02/26 0416) Weight:  [226 lb 8 oz (102.74 kg)] 226 lb 8 oz (102.74 kg) (02/26 0416)  Hemodynamic parameters for last 24 hours:    Intake/Output from previous day: 02/25 0701 - 02/26 0700 In: 1938 [I.V.:1300; Blood:638] Out: 495 [Urine:300; Drains:195] Intake/Output this shift:    General appearance: alert, cooperative and no distress Heart: regular rate and rhythm Lungs: dim left>right base Abdomen: soft, non-tender Extremities: + LE edeema Wound:  evh incis healing well, sternal incis is dressed  Lab Results:  Recent Labs  02/28/14 0505 02/28/14 1645  WBC 12.4* 11.1*  HGB 9.2* 8.4*  HCT 29.5* 26.9*  PLT 495* 430*   BMET:  Recent Labs  02/27/14 0525 02/28/14 0505  NA 135 135  K 3.0* 3.9  CL 102 103  CO2 28 24  GLUCOSE 107* 84  BUN 20 18  CREATININE 1.71* 1.67*  CALCIUM 8.1* 8.1*    PT/INR:  Recent Labs  03/01/14 0500  LABPROT 24.3*  INR 2.17*   ABG    Component Value Date/Time   PHART 7.356 02/15/2014 0402   HCO3 22.4 02/15/2014 0402   TCO2 24 02/15/2014 0402   ACIDBASEDEF 3.0* 02/15/2014 0402   O2SAT 94.0 02/15/2014 0402   CBG (last 3)   Recent Labs  02/27/14 2103  GLUCAP 85    Meds Scheduled Meds: . allopurinol  150 mg Oral Daily  . amiodarone  100 mg Oral Daily  . atorvastatin  40 mg Oral q1800  . bisacodyl  10 mg Oral Daily   Or  . bisacodyl  10  mg Rectal Daily  . carvedilol  12.5 mg Oral BID WC  . ciprofloxacin  400 mg Intravenous Q12H  . clopidogrel  75 mg Oral Daily  . colchicine  0.6 mg Oral Daily  . diltiazem  240 mg Oral Daily  . docusate sodium  200 mg Oral Daily  . feeding supplement (ENSURE COMPLETE)  237 mL Oral TID WC  . furosemide  40 mg Oral Q breakfast  . magnesium oxide  400 mg Oral BID  . metroNIDAZOLE  500 mg Oral Q8H  . pantoprazole  40 mg Oral Daily  . saccharomyces boulardii  250 mg Oral BID  . simethicone  80 mg Oral QID  . sodium chloride  10-40 mL Intracatheter Q12H  . sodium chloride  3 mL Intravenous Q12H  . vancomycin  1,250 mg Intravenous Q24H  . warfarin  4 mg Oral q1800   Continuous Infusions: . sodium chloride 20 mL/hr at 02/15/14 0300  . sodium chloride Stopped (02/26/14 2154)  . sodium chloride 10 mL/hr at 02/22/14 1400  . lactated ringers 10 mL/hr at 02/16/14 1800   PRN Meds:.levalbuterol, metoprolol, ondansetron (ZOFRAN) IV, oxyCODONE, sodium chloride, sodium chloride, traMADol  Xrays Ct Chest Wo Contrast  02/28/2014   CLINICAL  DATA:  Fourteen days postop CABG.  Evaluate sternal wires.  EXAM: CT CHEST WITHOUT CONTRAST  TECHNIQUE: Multidetector CT imaging of the chest was performed following the standard protocol without IV contrast.  COMPARISON:  02/10/2014  FINDINGS: Mediastinum: Heart size appears normal. There is calcified atherosclerotic plaque involving the thoracic aorta. Postoperative changes involving the anterior mediastinum consistent with recent sternotomy and CABG procedure. Nonspecific retrosternal fluid is identified this is contiguous with a larger fluid collection which surrounds the mid and distal sternum measuring approximately 14.2 x 3.4 cm x 4 cm. There is approximately 12.3 cm distal to the sternal notch there is distraction of the sternal components along the sternotomy site. The sternotomy wires do not appear fractured. The components are separated by approximately 2.3 cm,  image 41/series 201.  The trachea appears patent and is midline. The esophagus appears within normal limits.  Lungs/Pleura: Small to moderate bilateral pleural effusions are noted left greater than right. No interstitial edema or airspace consolidation. There is mild atelectasis within the lung bases.  Upper Abdomen: The visualized portions of the liver are normal. The gallbladder is unremarkable. Left adrenal nodule is again identified, image 59 of series 201.  Musculoskeletal: Mild degenerative disc disease noted within the thoracic spine.  IMPRESSION: 1. Postoperative changes consistent with recent median sternotomy and CABG procedure. 2. Distraction of distal sternal components along the sternotomy site identified with surrounding fluid collection (likely hematoma). 3. Small to moderate bilateral pleural effusions, left greater than right.   Electronically Signed   By: Signa Kell M.D.   On: 02/28/2014 10:43   Dg Chest Port 1 View  02/28/2014   CLINICAL DATA:  Status post incision and drainage of sternum with Re wiring, shortness of breath  EXAM: PORTABLE CHEST - 1 VIEW  COMPARISON:  02/28/2014 and 02/25/2014  FINDINGS: Significant enlarged of the cardiac silhouette stable. Status post CABG and median sternotomy. No change right PICC line. Right lung is clear except for mild lower lobe opacities suggesting atelectasis. Hazy density on the left again consistent with pleural effusion and underlying atelectasis.  IMPRESSION: Left effusion with bilateral lower lobe atelectasis   Electronically Signed   By: Esperanza Heir M.D.   On: 02/28/2014 16:36    Assessment/Plan: S/P Procedure(s) (LRB): Irrigation and Debridement of Sternum with Sternal Rewiring (N/A)  1 doing well after sternal rewire 2 conts with interm afib, INR 2.1 3 conts current rx for cdiff/UTI 4 rehab/pulm toilet, monitor effusions clinically 5 hopefully home in next48-72 hours    LOS: 25 days    Marqui Formby E 03/01/2014

## 2014-03-01 NOTE — Progress Notes (Signed)
Utilization review completed.  

## 2014-03-01 NOTE — Clinical Documentation Improvement (Signed)
MD's, NP's, and PA's   Please provide which type debridement that  "sharp debridement" documented in op note on 02/28/14 is referring too:  Excisional vs Nonexcisional .  Thank you  Please clarify documentation in the medical record of "debridement."  ? Excisional vs. nonexcisional  Risk Factors: s/p CABG x 4, C diff Colitis  Sign & Symptoms: drainage from sternal incision   Diagnostics: CT Scan of  Chest   Treatment  Surgical removal of necrotic skin rewiring of sternal wires    Thank You,  Lavonda JumboLawanda J Jearlene Bridwell ,RN Clinical Documentation Specialist:  [phone]  [facility]- Health Information Management

## 2014-03-01 NOTE — Progress Notes (Signed)
Called PA Gershon CraneWayne Gold for update on pt medication.  Pt did not ambulate in hallway today due to low blood pressures.  Pt did not feel dizzy or light headed. Pt heart rate throughout the day went to 30's and 110's while going from afib to sinus rhythm.  Currently in sinus rhythm. Current blood pressure 118/68, NSR 87. Held 12.5mg  coreg as this is best rate and bp has been today.  PA Gershon CraneWayne Gold aware. Pt resting with call bell within reach.  Will continue to monitor. Thomas HoffBurton, Shy Guallpa McClintock, RN

## 2014-03-01 NOTE — Progress Notes (Signed)
ANTIBIOTIC CONSULT NOTE - FOLLOW UP  Pharmacy Consult for Vancomycin Indication: empiric  No Known Allergies  Patient Measurements: Height: 5\' 11"  (180.3 cm) Weight: 226 lb 8 oz (102.74 kg) IBW/kg (Calculated) : 75.3 Adjusted Body Weight:   Vital Signs: Temp: 98.3 F (36.8 C) (02/26 0416) Temp Source: Oral (02/26 0416) BP: 97/66 mmHg (02/26 1127) Pulse Rate: 90 (02/26 1127) Intake/Output from previous day: 02/25 0701 - 02/26 0700 In: 1938 [I.V.:1300; Blood:638] Out: 495 [Urine:300; Drains:195] Intake/Output from this shift:    Labs:  Recent Labs  02/27/14 0525 02/28/14 0505 02/28/14 1645  WBC 14.1* 12.4* 11.1*  HGB 9.1* 9.2* 8.4*  PLT 514* 495* 430*  CREATININE 1.71* 1.67*  --    Estimated Creatinine Clearance: 48.1 mL/min (by C-G formula based on Cr of 1.67).  Recent Labs  02/28/14 0700 03/01/14 0904  VANCOTROUGH 11.8 11.8     Microbiology: Recent Results (from the past 720 hour(s))  MRSA PCR Screening     Status: Abnormal   Collection Time: 02/04/14  4:50 PM  Result Value Ref Range Status   MRSA by PCR POSITIVE (A) NEGATIVE Final    Comment:        The GeneXpert MRSA Assay (FDA approved for NASAL specimens only), is one component of a comprehensive MRSA colonization surveillance program. It is not intended to diagnose MRSA infection nor to guide or monitor treatment for MRSA infections. RESULT CALLED TO, READ BACK BY AND VERIFIED WITH: M,MCNEIL,RN AT 2055 BY L.PITT 02/04/14   Surgical pcr screen     Status: Abnormal   Collection Time: 02/13/14 12:48 PM  Result Value Ref Range Status   MRSA, PCR POSITIVE (A) NEGATIVE Final   Staphylococcus aureus POSITIVE (A) NEGATIVE Final    Comment:        The Xpert SA Assay (FDA approved for NASAL specimens in patients over 73 years of age), is one component of a comprehensive surveillance program.  Test performance has been validated by [facility] for patients greater than or equal to 1 year  old. It is not intended to diagnose infection nor to guide or monitor treatment.   Clostridium Difficile by PCR     Status: Abnormal   Collection Time: 02/18/14  3:00 AM  Result Value Ref Range Status   C difficile by pcr POSITIVE (A) NEGATIVE Final    Comment: CRITICAL RESULT CALLED TO, READ BACK BY AND VERIFIED WITH: D. VERNON RN 9:45 02/18/14 ([email or web address])   Wound culture     Status: None   Collection Time: 02/24/14 12:15 PM  Result Value Ref Range Status   Specimen Description STERNUM  Final   Special Requests NONE  Final   Gram Stain   Final    NO WBC SEEN NO SQUAMOUS EPITHELIAL CELLS SEEN NO ORGANISMS SEEN Performed at [facility]    Culture   Final    NO GROWTH 2 DAYS Performed at [facility]    Report Status 02/27/2014 FINAL  Final  Culture, Urine     Status: None   Collection Time: 02/24/14  3:38 PM  Result Value Ref Range Status   Specimen Description URINE, CLEAN CATCH  Final   Special Requests A  Final   Colony Count   Final    >=100,000 COLONIES/ML Performed at [facility]    Culture   Final    KLEBSIELLA PNEUMONIAE PSEUDOMONAS AERUGINOSA Performed at [facility]    Report Status 02/28/2014 FINAL  Final   Organism ID,  Bacteria KLEBSIELLA PNEUMONIAE  Final   Organism ID, Bacteria PSEUDOMONAS AERUGINOSA  Final      Susceptibility   Klebsiella pneumoniae - MIC*    AMPICILLIN >=32 RESISTANT Resistant     CEFAZOLIN <=4 SENSITIVE Sensitive     CEFTRIAXONE <=1 SENSITIVE Sensitive     CIPROFLOXACIN <=0.25 SENSITIVE Sensitive     GENTAMICIN <=1 SENSITIVE Sensitive     LEVOFLOXACIN 1 SENSITIVE Sensitive     NITROFURANTOIN 64 INTERMEDIATE Intermediate     TOBRAMYCIN <=1 SENSITIVE Sensitive     TRIMETH/SULFA <=20 SENSITIVE Sensitive     PIP/TAZO 16 SENSITIVE Sensitive     * KLEBSIELLA PNEUMONIAE   Pseudomonas aeruginosa - MIC*    CEFEPIME 4 SENSITIVE Sensitive     CEFTAZIDIME 4 SENSITIVE Sensitive     CIPROFLOXACIN <=0.25  SENSITIVE Sensitive     GENTAMICIN 2 SENSITIVE Sensitive     IMIPENEM 2 SENSITIVE Sensitive     PIP/TAZO 8 SENSITIVE Sensitive     TOBRAMYCIN <=1 SENSITIVE Sensitive     * PSEUDOMONAS AERUGINOSA  Culture, blood (single)     Status: None (Preliminary result)   Collection Time: 02/25/14  9:00 AM  Result Value Ref Range Status   Specimen Description BLOOD LEFT HAND  Final   Special Requests BOTTLES DRAWN AEROBIC ONLY 8CC  Final   Culture   Final           BLOOD CULTURE RECEIVED NO GROWTH TO DATE CULTURE WILL BE HELD FOR 5 DAYS BEFORE ISSUING A FINAL NEGATIVE REPORT Performed at [facility]    Report Status PENDING  Incomplete  Anaerobic culture     Status: None (Preliminary result)   Collection Time: 02/28/14 12:11 PM  Result Value Ref Range Status   Specimen Description WOUND  Final   Special Requests STERNAL WOULD POF VANCOMYCIN AND CIPRO PART A  Final   Gram Stain PENDING  Incomplete   Culture   Final    NO ANAEROBES ISOLATED; CULTURE IN PROGRESS FOR 5 DAYS Performed at [facility]    Report Status PENDING  Incomplete  Wound culture     Status: None (Preliminary result)   Collection Time: 02/28/14 12:11 PM  Result Value Ref Range Status   Specimen Description WOUND  Final   Special Requests STERNAL WOUND POF VANCOMYCIN AND CIPRO PART A  Final   Gram Stain   Final    FEW WBC PRESENT, PREDOMINANTLY PMN NO SQUAMOUS EPITHELIAL CELLS SEEN NO ORGANISMS SEEN Performed at [facility]    Culture   Final    NO GROWTH 1 DAY Performed at [facility]    Report Status PENDING  Incomplete  Anaerobic culture     Status: None (Preliminary result)   Collection Time: 02/28/14 12:17 PM  Result Value Ref Range Status   Specimen Description WOUND  Final   Special Requests STERNAL WOUND POF VANCOMYCIN AND CIPRO PART B  Final   Gram Stain   Final    MODERATE WBC PRESENT, PREDOMINANTLY PMN NO ORGANISMS SEEN Performed at [facility]    Culture    Final    NO ANAEROBES ISOLATED; CULTURE IN PROGRESS FOR 5 DAYS Performed at [facility]    Report Status PENDING  Incomplete  Body fluid culture     Status: None (Preliminary result)   Collection Time: 02/28/14 12:17 PM  Result Value Ref Range Status   Specimen Description WOUND  Final   Special Requests STERNAL WOUND POF VANCOMYCINAND  CIPRO PART B  Final   Gram Stain   Final    MODERATE WBC PRESENT, PREDOMINANTLY PMN NO ORGANISMS SEEN Performed at [facility]    Culture   Final    NO GROWTH 1 DAY Performed at [facility]    Report Status PENDING  Incomplete    Anti-infectives    Start     Dose/Rate Route Frequency Ordered Stop   03/02/14 1200  vancomycin (VANCOCIN) 1,500 mg in sodium chloride 0.9 % 500 mL IVPB     1,500 mg 250 mL/hr over 120 Minutes Intravenous Every 24 hours 03/01/14 1157     03/01/14 1200  vancomycin (VANCOCIN) 1,750 mg in sodium chloride 0.9 % 500 mL IVPB     1,750 mg 250 mL/hr over 120 Minutes Intravenous NOW 03/01/14 1157 03/02/14 1200   03/01/14 0800  ciprofloxacin (CIPRO) IVPB 400 mg     400 mg 200 mL/hr over 60 Minutes Intravenous Every 12 hours 03/01/14 0733     02/28/14 1207  polymyxin B 500,000 Units, bacitracin 50,000 Units in sodium chloride irrigation 0.9 % 500 mL irrigation  Status:  Discontinued       As needed 02/28/14 1208 02/28/14 1353   02/27/14 1000  ciprofloxacin (CIPRO) IVPB 200 mg  Status:  Discontinued     200 mg 100 mL/hr over 60 Minutes Intravenous Every 12 hours 02/27/14 0856 03/01/14 0733   02/25/14 1200  metroNIDAZOLE (FLAGYL) tablet 500 mg     500 mg Oral Every 8 hours 02/25/14 1107     02/25/14 0900  vancomycin (VANCOCIN) 1,250 mg in sodium chloride 0.9 % 250 mL IVPB  Status:  Discontinued     1,250 mg 166.7 mL/hr over 90 Minutes Intravenous Every 24 hours 02/25/14 0751 03/01/14 1157   02/25/14 0830  metroNIDAZOLE (FLAGYL) tablet 500 mg  Status:  Discontinued     500 mg Oral Every 8 hours 02/25/14  0739 02/25/14 1107   02/25/14 0745  vancomycin (VANCOCIN) IVPB 1000 mg/200 mL premix  Status:  Discontinued     1,000 mg 200 mL/hr over 60 Minutes Intravenous Every 24 hours 02/25/14 0739 02/25/14 0743   02/18/14 1115  vancomycin (VANCOCIN) 50 mg/mL oral solution 125 mg  Status:  Discontinued     125 mg Oral 4 times daily 02/18/14 1059 02/25/14 0739   02/14/14 2015  vancomycin (VANCOCIN) IVPB 1000 mg/200 mL premix     1,000 mg 200 mL/hr over 60 Minutes Intravenous  Once 02/14/14 1425 02/14/14 2107   02/14/14 1700  cefUROXime (ZINACEF) 1.5 g in dextrose 5 % 50 mL IVPB     1.5 g 100 mL/hr over 30 Minutes Intravenous Every 12 hours 02/14/14 1425 02/16/14 0617   02/14/14 0400  vancomycin (VANCOCIN) 1,250 mg in sodium chloride 0.9 % 250 mL IVPB     1,250 mg 166.7 mL/hr over 90 Minutes Intravenous To Surgery 02/13/14 1225 02/14/14 0926   02/14/14 0400  cefUROXime (ZINACEF) 1.5 g in dextrose 5 % 50 mL IVPB     1.5 g 100 mL/hr over 30 Minutes Intravenous To Surgery 02/13/14 1225 02/14/14 1239   02/14/14 0400  cefUROXime (ZINACEF) 750 mg in dextrose 5 % 50 mL IVPB  Status:  Discontinued     750 mg 100 mL/hr over 30 Minutes Intravenous To Surgery 02/13/14 1225 02/14/14 1425      Assessment: 74yo male on Vancomycin, now s/p sternal rewiring with plan for 3 more days of Vanc.  Vanc trough drawn on 2/25  was 11.8, appropriately drawn but then dose was hung late d/t OR.  Rx did not see this level & ordered trough for today which was also 11.8, now a 21 hr prior level & lower relative to next dose as dose hung late on 2/25.  Based on 2/25 level, will adjust daily dose but given an extra bump in dose today as troughing lower as a result of late dose 2/25.  Goal of Therapy:  Vancomycin trough level 15-20 mcg/ml  Plan:  Vanc  IV x 1 today, then  IV q24 Watch renal fxn F/U plans to d/c Vanc in 72hr  Marisue Humble, PharmD Clinical Pharmacist [facility]

## 2014-03-01 NOTE — Progress Notes (Signed)
CARDIAC REHAB PHASE I   PRE:  Rate/Rhythm: 82 atrial fib  BP:  Supine:   Sitting: 83/60  Standing: 63/51   SaO2: 95%RA  MODE:  Ambulation: 0 ft Orthostatic, lightheaded, pale  1021-1045 Pt willing to walk. Checked sats off of oxygen and good. Pt stated he felt dizzy earlier in XRAY. Did BP standing and pt lightheaded and pale with BP 63/51. Gave pt something to drink and notified pt's RN. Pt back to sitting in recliner with call bell. Staff can walk later if BP improves.       Luetta Nuttingharlene Zareah Hunzeker, RN BSN  03/01/2014 10:40 AM

## 2014-03-01 NOTE — Progress Notes (Signed)
Documented 35 ml from JP drain.  Pt stated that Dr. Donata ClayVan Trigt had emptied earlier in the shift.  Notation stated minimal drainage. Pt resting with call bell within reach.  Will continue to monitor. Thomas HoffBurton, Lemmie Vanlanen McClintock, RN

## 2014-03-01 NOTE — Op Note (Signed)
[name]:  Rajagopalan, Amiir                ACCOUNT NO.:  [id number]  MEDICAL RECORD NO.:  [id number]  LOCATION:  [id number]                        FACILITY:  [facility]  PHYSICIAN:  Kerin PernaPeter Van Trigt, M.D.  DATE OF BIRTH:  1940-07-14  DATE OF PROCEDURE:  02/28/2014 DATE OF DISCHARGE:                              OPERATIVE REPORT   OPERATION:  Debridement of lower sternal incision with sternal rewiring.  SURGEON:  Kerin PernaPeter Van Trigt, MD  PREOPERATIVE DIAGNOSIS:  Separation of the lower sternum from intact sternal wire cutting through sternal bone.  POSTOPERATIVE DIAGNOSIS:  Separation of the lower sternum from intact sternal wire cutting through sternal bone.  ANESTHESIA:  General.  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  He required a coronary endarterectomy.  Postoperatively, he developed atrial fibrillation as well as C. diff colitis.  He developed significant colonic dilatation but slowly clinically improved on appropriate antibiotics.  His lower sternum had  intact skin but some scant drainage.  This was cultured and was negative.  He developed an elevated white count.  A urine culture showed urinary tract infection with both Pseudomonas and Klebsiella organisms which were treated with appropriate antibiotic.  On the day of surgery on daily exam-around the lower sternum was found to be unstable and the patient noticed he had some popping sensations.  There was no significant drainage.  A CT scan showed that there was sternal separation from a wire that it cut through the right sternal edge with a fluid collection-hematoma.  I recommend that we open the lower part of the sternal incision, remove the wire, drain the fluid collection, and rewire the sternum, and re- close the incision.  I discussed the indications, benefits, alternatives, and risks of this procedure with the patient, and he agreed to  proceed with the surgery.  OPERATIVE FINDINGS: 1. Sternal separation and deep fascial separation after the lowest     sternal wire cut through the sternum which also resulted in the     deep fascial sutures to cut through the fascia, probably related to     the colonic ileus, abdominal distention, and suboptimal nutrition. 2. No evidence of infection. 3. The other sternal wires were intact and in the appropriate     location.  OPERATIVE PROCEDURE:  The patient was brought to the operating room and placed supine on the operating table where general anesthesia was induced.  A proper time-out was performed.  The lower part of the sternal incision was opened.  There was immediate serosanguineous thin fluid that drained out.  This was sent for cultures.  The fascia and subcutaneous tissue had some areas of necrosis which were sharply debrided.  Next, the lowest sternal wire was visible but was around the sternum, although it had loosened with the lower sternal separation and this wire was then tightened.  The soft tissues were irrigated with a pulse lavage irrigator.  There was clean tissue remaining.  The lower sternum was then reapproximated with a figure-of-eight double gauge steel wire to pull the fragments of the sternum together, followed by 2 additional sternal wires below that for security.  The fascia was closed with interrupted #1 Vicryl.  The subcutaneous layer was closed with a running 2-0 Vicryl and tied to the previously placed subcutaneous running suture line.  The skin was closed with interrupted skin staples.  A 15-French Jackson-Pratt drain was placed beneath the sternum and brought out through separate incision and connected to a suction bulb chamber.  A sterile dressing was applied.  The patient was extubated and returned to recovery room.     Kerin Perna, M.D.     PV/MEDQ  D:  02/28/2014  T:  03/01/2014  Job:  [id number]

## 2014-03-01 NOTE — Progress Notes (Signed)
1 Day Post-Op Procedure(s) (LRB): Irrigation and Debridement of Sternum with Sternal Rewiring (N/A) Subjective: Stable after sternal rewiring nsr this am Min JP drainage from retrosternal drain CAR with min atelectasis L base  Objective: Vital signs in last 24 hours: Temp:  [96.5 F (35.8 C)-98.6 F (37 C)] 98.3 F (36.8 C) (02/26 0416) Pulse Rate:  [86-116] 90 (02/26 0416) Cardiac Rhythm:  [-] Atrial fibrillation;Normal sinus rhythm (02/26 0811) Resp:  [12-18] 18 (02/26 0416) BP: (100-133)/(63-82) 133/82 mmHg (02/26 0416) SpO2:  [90 %-95 %] 90 % (02/26 0416) Weight:  [226 lb 8 oz (102.74 kg)] 226 lb 8 oz (102.74 kg) (02/26 0416)  Hemodynamic parameters for last 24 hours:  afebrile nsr  Intake/Output from previous day: 02/25 0701 - 02/26 0700 In: 1938 [I.V.:1300; Blood:638] Out: 495 [Urine:300; Drains:195] Intake/Output this shift:    Dressing dry Mild edema  Lab Results:  Recent Labs  02/28/14 0505 02/28/14 1645  WBC 12.4* 11.1*  HGB 9.2* 8.4*  HCT 29.5* 26.9*  PLT 495* 430*   BMET:  Recent Labs  02/27/14 0525 02/28/14 0505  NA 135 135  K 3.0* 3.9  CL 102 103  CO2 28 24  GLUCOSE 107* 84  BUN 20 18  CREATININE 1.71* 1.67*  CALCIUM 8.1* 8.1*    PT/INR:  Recent Labs  03/01/14 0500  LABPROT 24.3*  INR 2.17*   ABG    Component Value Date/Time   PHART 7.356 02/15/2014 0402   HCO3 22.4 02/15/2014 0402   TCO2 24 02/15/2014 0402   ACIDBASEDEF 3.0* 02/15/2014 0402   O2SAT 94.0 02/15/2014 0402   CBG (last 3)   Recent Labs  02/27/14 2103  GLUCAP 85    Assessment/Plan: S/P Procedure(s) (LRB): Irrigation and Debridement of Sternum with Sternal Rewiring (N/A) Mobilize Diuresis Wound cultures from sternal rewiring neg- cont vanco for 72 hrs and cont cipro for gram neg UTI  LOS: 25 days    VAN TRIGT III,Mellissa Conley 03/01/2014

## 2014-03-02 LAB — BASIC METABOLIC PANEL
Anion gap: 4 — ABNORMAL LOW (ref 5–15)
BUN: 13 mg/dL (ref 6–23)
CO2: 26 mmol/L (ref 19–32)
Calcium: 7.9 mg/dL — ABNORMAL LOW (ref 8.4–10.5)
Chloride: 103 mmol/L (ref 96–112)
Creatinine, Ser: 1.68 mg/dL — ABNORMAL HIGH (ref 0.50–1.35)
GFR calc Af Amer: 45 mL/min — ABNORMAL LOW (ref >90)
GFR calc non Af Amer: 39 mL/min — ABNORMAL LOW (ref >90)
Glucose, Bld: 116 mg/dL — ABNORMAL HIGH (ref 70–99)
Potassium: 3.4 mmol/L — ABNORMAL LOW (ref 3.5–5.1)
Sodium: 133 mmol/L — ABNORMAL LOW (ref 135–145)

## 2014-03-02 LAB — WOUND CULTURE: Culture: NO GROWTH

## 2014-03-02 LAB — CBC
HCT: 29.6 % — ABNORMAL LOW (ref 39.0–52.0)
Hemoglobin: 9.3 g/dL — ABNORMAL LOW (ref 13.0–17.0)
MCH: 29.4 pg (ref 26.0–34.0)
MCHC: 31.4 g/dL (ref 30.0–36.0)
MCV: 93.7 fL (ref 78.0–100.0)
Platelets: 441 10*3/uL — ABNORMAL HIGH (ref 150–400)
RBC: 3.16 MIL/uL — ABNORMAL LOW (ref 4.22–5.81)
RDW: 16.1 % — ABNORMAL HIGH (ref 11.5–15.5)
WBC: 11.7 10*3/uL — ABNORMAL HIGH (ref 4.0–10.5)

## 2014-03-02 LAB — PROTIME-INR
INR: 2.32 — ABNORMAL HIGH (ref 0.00–1.49)
Prothrombin Time: 25.7 seconds — ABNORMAL HIGH (ref 11.6–15.2)

## 2014-03-02 MED ORDER — CARVEDILOL 3.125 MG PO TABS
3.1250 mg | ORAL_TABLET | Freq: Two times a day (BID) | ORAL | Status: DC
  Administered 2014-03-03 – 2014-03-05 (×5): 3.125 mg via ORAL
  Filled 2014-03-02 (×8): qty 1

## 2014-03-02 MED ORDER — POTASSIUM CHLORIDE CRYS ER 20 MEQ PO TBCR
40.0000 meq | EXTENDED_RELEASE_TABLET | Freq: Two times a day (BID) | ORAL | Status: AC
  Administered 2014-03-02 (×2): 40 meq via ORAL
  Filled 2014-03-02 (×2): qty 2

## 2014-03-02 NOTE — Progress Notes (Signed)
CARDIAC REHAB PHASE I   PRE:  Rate/Rhythm: 112 afib  BP:   Sitting: 100/81 HR- 118 standing:  67/46  HR140  Asymptomatic. HR-140 Pt returned to seat.  Recheck BP:  Sitting:  99/83  HR-120 standing:  75/51 HR 140 Asymptomatic.    Pt encouraged to increase PO water intake.  Pt given fresh water.  Pt did not ambulate.  RN made aware of BP readings.   [id number] Dan Europeion, Joann Hamilton

## 2014-03-02 NOTE — Progress Notes (Signed)
Patient Name: Gregory Davila Date of Encounter: 03/02/2014  Principal Problem:   Coronary atherosclerosis of native coronary artery Active Problems:   Paroxysmal atrial fibrillation   Hypertension   Renal insufficiency   Atrial fibrillation with RVR   Unstable angina   S/P CABG x 4   Primary Cardiologist: Dr. Excell Seltzer EP: Dr. Johney Frame  Patient Profile: 74 yo male w/ hx CAD, PAF (coumadin), HTN , CKD, remote PE, was admitted 02/01 w/ PAF/RVR and chest pain from [place]. Pt had CABG 02/12, cards seeing post-op for afib, ?tachy/brady. Problems w/ prolonged QTc (amio dose decreased), bradycardia w/ ?HR 30s, have limited med options. EF 40%  SUBJECTIVE: Patient has been lightheaded when he tries to stand, otherwise feels like he is improving. Has not had any more diarrhea.  OBJECTIVE Filed Vitals:   03/01/14 1826 03/01/14 1933 03/02/14 0420 03/02/14 0809  BP: 118/68 119/66 108/71   Pulse: 82 77 85   Temp:  98.4 F (36.9 C) 98.3 F (36.8 C)   TempSrc:  Oral Oral   Resp:  17 18   Height:      Weight:    225 lb 12 oz (102.4 kg)  SpO2:  90% 90%     Intake/Output Summary (Last 24 hours) at 03/02/14 0901 Last data filed at 03/02/14 0754  Gross per 24 hour  Intake   1090 ml  Output   1190 ml  Net   -100 ml   Filed Weights   02/28/14 0500 03/01/14 0416 03/02/14 0809  Weight: 220 lb 14.4 oz (100.2 kg) 226 lb 8 oz (102.74 kg) 225 lb 12 oz (102.4 kg)    PHYSICAL EXAM General: Well developed, well nourished, male in no acute distress. Head: Normocephalic, atraumatic.  Neck: Supple without bruits, JVD not elevated. Lungs:  Resp regular and unlabored, decreased breath sounds bases bilaterally with some Rales. Heart: Slightly irregular, S1, S2, no S3, S4, no significant murmur; no rub. Abdomen: Soft, non-tender, distended, BS + x 4. Slightly tympanic Extremities: No clubbing, cyanosis, 1+ edema.  Neuro: Alert and oriented X 3. Moves all extremities spontaneously. Psych:  Normal affect.  LABS: CBC: Recent Labs  02/28/14 0505 02/28/14 1645 03/02/14 0516  WBC 12.4* 11.1* 11.7*  NEUTROABS 9.2*  --   --   HGB 9.2* 8.4* 9.3*  HCT 29.5* 26.9* 29.6*  MCV 92.8 93.1 93.7  PLT 495* 430* 441*   INR: Recent Labs  03/02/14 0516  INR 2.32*   Basic Metabolic Panel: Recent Labs  02/28/14 0505 03/02/14 0516  NA 135 133*  K 3.9 3.4*  CL 103 103  CO2 24 26  GLUCOSE 84 116*  BUN 18 13  CREATININE 1.67* 1.68*  CALCIUM 8.1* 7.9*   TELE: Currently sinus rhythm with PVCs, has had an episode of atrial fibrillation virtually every day.    Radiology/Studies: Dg Chest 2 View  03/01/2014   CLINICAL DATA:  Shortness of breath. Postop debridement of lower sternal incision with sternal requiring.  EXAM: CHEST  2 VIEW  COMPARISON:  02/28/2014  FINDINGS: Right PICC remains in place with tip projecting over the lower SVC. Sequelae of prior CABG are again identified. There is mild enlargement of the cardiac silhouette. The patient has taken a greater inspiration than on the prior study. Minimal opacity is present in the right lung base, slightly improved from prior. There are small right and small-to-moderate left pleural effusions, likely not significantly changed. Curvilinear opacities in the left lung base are most suggestive  of subsegmental atelectasis. No segmental airspace consolidation, edema, or pneumothorax is identified. A surgical drain is noted deep to the sternum.  IMPRESSION: 1. Greater inspiration with subsegmental atelectasis in the left greater than right lung bases. 2. Small right and small-to-moderate left pleural effusions.   Electronically Signed   By: Sebastian Ache   On: 03/01/2014 08:50   Ct Chest Wo Contrast  02/28/2014   CLINICAL DATA:  Fourteen days postop CABG.  Evaluate sternal wires.  EXAM: CT CHEST WITHOUT CONTRAST  TECHNIQUE: Multidetector CT imaging of the chest was performed following the standard protocol without IV contrast.  COMPARISON:   02/10/2014  FINDINGS: Mediastinum: Heart size appears normal. There is calcified atherosclerotic plaque involving the thoracic aorta. Postoperative changes involving the anterior mediastinum consistent with recent sternotomy and CABG procedure. Nonspecific retrosternal fluid is identified this is contiguous with a larger fluid collection which surrounds the mid and distal sternum measuring approximately 14.2 x 3.4 cm x 4 cm. There is approximately 12.3 cm distal to the sternal notch there is distraction of the sternal components along the sternotomy site. The sternotomy wires do not appear fractured. The components are separated by approximately 2.3 cm, image 41/series 201.  The trachea appears patent and is midline. The esophagus appears within normal limits.  Lungs/Pleura: Small to moderate bilateral pleural effusions are noted left greater than right. No interstitial edema or airspace consolidation. There is mild atelectasis within the lung bases.  Upper Abdomen: The visualized portions of the liver are normal. The gallbladder is unremarkable. Left adrenal nodule is again identified, image 59 of series 201.  Musculoskeletal: Mild degenerative disc disease noted within the thoracic spine.  IMPRESSION: 1. Postoperative changes consistent with recent median sternotomy and CABG procedure. 2. Distraction of distal sternal components along the sternotomy site identified with surrounding fluid collection (likely hematoma). 3. Small to moderate bilateral pleural effusions, left greater than right.   Electronically Signed   By: Signa Kell M.D.   On: 02/28/2014 10:43   Dg Chest Port 1 View  02/28/2014   CLINICAL DATA:  Status post incision and drainage of sternum with Re wiring, shortness of breath  EXAM: PORTABLE CHEST - 1 VIEW  COMPARISON:  02/28/2014 and 02/25/2014  FINDINGS: Significant enlarged of the cardiac silhouette stable. Status post CABG and median sternotomy. No change right PICC line. Right lung is  clear except for mild lower lobe opacities suggesting atelectasis. Hazy density on the left again consistent with pleural effusion and underlying atelectasis.  IMPRESSION: Left effusion with bilateral lower lobe atelectasis   Electronically Signed   By: Esperanza Heir M.D.   On: 02/28/2014 16:36     Current Medications:  . allopurinol  150 mg Oral Daily  . amiodarone  100 mg Oral Daily  . atorvastatin  40 mg Oral q1800  . bisacodyl  10 mg Oral Daily   Or  . bisacodyl  10 mg Rectal Daily  . carvedilol  3.125 mg Oral BID WC  . ciprofloxacin  400 mg Intravenous Q12H  . clopidogrel  75 mg Oral Daily  . colchicine  0.6 mg Oral Daily  . docusate sodium  200 mg Oral Daily  . feeding supplement (ENSURE COMPLETE)  237 mL Oral TID WC  . furosemide  40 mg Oral Q breakfast  . magnesium oxide  400 mg Oral BID  . metroNIDAZOLE  500 mg Oral Q8H  . pantoprazole  40 mg Oral Daily  . potassium chloride  40 mEq Oral BID  .  saccharomyces boulardii  250 mg Oral BID  . simethicone  80 mg Oral QID  . sodium chloride  10-40 mL Intracatheter Q12H  . sodium chloride  3 mL Intravenous Q12H  . vancomycin  1,500 mg Intravenous Q24H  . warfarin  4 mg Oral q1800  . Warfarin - Physician Dosing Inpatient   Does not apply q1800   . sodium chloride 20 mL/hr at 02/15/14 0300  . sodium chloride Stopped (02/26/14 2154)  . sodium chloride 10 mL/hr at 02/22/14 1400  . lactated ringers 10 mL/hr at 02/16/14 1800    ASSESSMENT AND PLAN: Paroxysmal atrial fibrillation: - When he is in atrial fibrillation he tends to go fast. He has had some pauses greater than 2 seconds, but none greater than 3 seconds. His heart rate will drop down upon occasion into the 40s but does not sustain less than 50. He received carvedilol 12.5 mg on 2/20 5 PM and 2/20 6 AM. Since then, it has been held. He has been restarted on carvedilol at 3.125 mg twice a day. However, currently his blood pressure is too low and it has been held. He has not  received any Cardizem since 2/25. He has gotten his amiodarone. M.D. to review and advise changes.  Today, SBP 90s systolic at rest, but dropped into the 60s when he stood up. Advised RN to hold the Lasix.  Principal Problem:   Coronary atherosclerosis of native coronary artery Active Problems:   Hypertension   Renal insufficiency   Atrial fibrillation with RVR   Unstable angina   S/P CABG x 4   Signed, Theodore DemarkRhonda Barrett , PA-C 9:01 AM 03/02/2014  patinet seen and examined  I agree with findings of R Barrett above  Patient currently going fast 110s to 120s  On 100 qd of amiodarone.  On discussion with J Allred who knows patient from consult earlier this week would try increasiing amiodarone to 200 bid for a few day   Agree with hydration and following orthostatic  BP  Afib is not explaining drop in BP   Will continue to follow    Dietrich PatesPaula Estell Puccini

## 2014-03-02 NOTE — Progress Notes (Addendum)
[address]       [place] [address]             [phone]      2 Days Post-Op Procedure(s) (LRB): Irrigation and Debridement of Sternum with Sternal Rewiring (N/A) Subjective: Having some issues with tachy -brady, cardizem and coreg held yesterday d/t nurse reporting HR as low as 30's. Currently afib120's to 140's  Objective: Vital signs in last 24 hours: Temp:  [98 F (36.7 C)-98.4 F (36.9 C)] 98.3 F (36.8 C) (02/27 0420) Pulse Rate:  [77-94] 85 (02/27 0420) Cardiac Rhythm:  [-] Atrial fibrillation;Normal sinus rhythm (02/26 0811) Resp:  [17-19] 18 (02/27 0420) BP: (92-119)/(63-71) 108/71 mmHg (02/27 0420) SpO2:  [90 %] 90 % (02/27 0420)  Hemodynamic parameters for last 24 hours:    Intake/Output from previous day: 02/26 0701 - 02/27 0700 In: 730 [P.O.:720; I.V.:10] Out: 1190 [Urine:1125; Drains:65] Intake/Output this shift:    General appearance: alert, cooperative and no distress Heart: irregularly irregular rhythm and tachy Lungs: dim L>R base Abdomen: mod distension, + BS, non-tender Extremities: + RLE edema Wound: incis healing well, 215 cc recorded from JP drain yesterday  Lab Results:  Recent Labs  02/28/14 1645 03/02/14 0516  WBC 11.1* 11.7*  HGB 8.4* 9.3*  HCT 26.9* 29.6*  PLT 430* 441*   BMET:  Recent Labs  02/28/14 0505 03/02/14 0516  NA 135 133*  K 3.9 3.4*  CL 103 103  CO2 24 26  GLUCOSE 84 116*  BUN 18 13  CREATININE 1.67* 1.68*  CALCIUM 8.1* 7.9*    PT/INR:  Recent Labs  03/02/14 0516  LABPROT 25.7*  INR 2.32*   ABG    Component Value Date/Time   PHART 7.356 02/15/2014 0402   HCO3 22.4 02/15/2014 0402   TCO2 24 02/15/2014 0402   ACIDBASEDEF 3.0* 02/15/2014 0402   O2SAT 94.0 02/15/2014 0402   CBG (last 3)   Recent Labs  02/27/14 2103  GLUCAP 85    Meds Scheduled Meds: . allopurinol  150 mg Oral Daily  . amiodarone  100 mg Oral Daily  . atorvastatin  40 mg Oral q1800  . bisacodyl   10 mg Oral Daily   Or  . bisacodyl  10 mg Rectal Daily  . carvedilol  12.5 mg Oral BID WC  . ciprofloxacin  400 mg Intravenous Q12H  . clopidogrel  75 mg Oral Daily  . colchicine  0.6 mg Oral Daily  . diltiazem  240 mg Oral Daily  . docusate sodium  200 mg Oral Daily  . feeding supplement (ENSURE COMPLETE)  237 mL Oral TID WC  . furosemide  40 mg Oral Q breakfast  . magnesium oxide  400 mg Oral BID  . metroNIDAZOLE  500 mg Oral Q8H  . pantoprazole  40 mg Oral Daily  . saccharomyces boulardii  250 mg Oral BID  . simethicone  80 mg Oral QID  . sodium chloride  10-40 mL Intracatheter Q12H  . sodium chloride  3 mL Intravenous Q12H  . vancomycin  1,500 mg Intravenous Q24H  . warfarin  4 mg Oral q1800  . Warfarin - Physician Dosing Inpatient   Does not apply q1800   Continuous Infusions: . sodium chloride 20 mL/hr at 02/15/14 0300  . sodium chloride Stopped (02/26/14 2154)  . sodium chloride 10 mL/hr at 02/22/14 1400  . lactated ringers 10 mL/hr at 02/16/14 1800   PRN Meds:.levalbuterol, metoprolol, ondansetron (ZOFRAN) IV, oxyCODONE, sodium  chloride, sodium chloride, traMADol  Xrays Dg Chest 2 View  03/01/2014   CLINICAL DATA:  Shortness of breath. Postop debridement of lower sternal incision with sternal requiring.  EXAM: CHEST  2 VIEW  COMPARISON:  02/28/2014  FINDINGS: Right PICC remains in place with tip projecting over the lower SVC. Sequelae of prior CABG are again identified. There is mild enlargement of the cardiac silhouette. The patient has taken a greater inspiration than on the prior study. Minimal opacity is present in the right lung base, slightly improved from prior. There are small right and small-to-moderate left pleural effusions, likely not significantly changed. Curvilinear opacities in the left lung base are most suggestive of subsegmental atelectasis. No segmental airspace consolidation, edema, or pneumothorax is identified. A surgical drain is noted deep to the  sternum.  IMPRESSION: 1. Greater inspiration with subsegmental atelectasis in the left greater than right lung bases. 2. Small right and small-to-moderate left pleural effusions.   Electronically Signed   By: Sebastian Ache   On: 03/01/2014 08:50   Ct Chest Wo Contrast  02/28/2014   CLINICAL DATA:  Fourteen days postop CABG.  Evaluate sternal wires.  EXAM: CT CHEST WITHOUT CONTRAST  TECHNIQUE: Multidetector CT imaging of the chest was performed following the standard protocol without IV contrast.  COMPARISON:  02/10/2014  FINDINGS: Mediastinum: Heart size appears normal. There is calcified atherosclerotic plaque involving the thoracic aorta. Postoperative changes involving the anterior mediastinum consistent with recent sternotomy and CABG procedure. Nonspecific retrosternal fluid is identified this is contiguous with a larger fluid collection which surrounds the mid and distal sternum measuring approximately 14.2 x 3.4 cm x 4 cm. There is approximately 12.3 cm distal to the sternal notch there is distraction of the sternal components along the sternotomy site. The sternotomy wires do not appear fractured. The components are separated by approximately 2.3 cm, image 41/series 201.  The trachea appears patent and is midline. The esophagus appears within normal limits.  Lungs/Pleura: Small to moderate bilateral pleural effusions are noted left greater than right. No interstitial edema or airspace consolidation. There is mild atelectasis within the lung bases.  Upper Abdomen: The visualized portions of the liver are normal. The gallbladder is unremarkable. Left adrenal nodule is again identified, image 59 of series 201.  Musculoskeletal: Mild degenerative disc disease noted within the thoracic spine.  IMPRESSION: 1. Postoperative changes consistent with recent median sternotomy and CABG procedure. 2. Distraction of distal sternal components along the sternotomy site identified with surrounding fluid collection (likely  hematoma). 3. Small to moderate bilateral pleural effusions, left greater than right.   Electronically Signed   By: Signa Kell M.D.   On: 02/28/2014 10:43   Dg Chest Port 1 View  02/28/2014   CLINICAL DATA:  Status post incision and drainage of sternum with Re wiring, shortness of breath  EXAM: PORTABLE CHEST - 1 VIEW  COMPARISON:  02/28/2014 and 02/25/2014  FINDINGS: Significant enlarged of the cardiac silhouette stable. Status post CABG and median sternotomy. No change right PICC line. Right lung is clear except for mild lower lobe opacities suggesting atelectasis. Hazy density on the left again consistent with pleural effusion and underlying atelectasis.  IMPRESSION: Left effusion with bilateral lower lobe atelectasis   Electronically Signed   By: Esperanza Heir M.D.   On: 02/28/2014 16:36    Assessment/Plan: S/P Procedure(s) (LRB): Irrigation and Debridement of Sternum with Sternal Rewiring (N/A)  1 HR is currently afib with RVR will reduce coreg dose and give this  am. May need EP to re-eval( they have seen post op). Cont to hold cardizem at this time. INR is 2.3, needs K+ replacement. Getting Mg++ replacement, recheck in am 2 keep jp in place 3 conts current abx dor UTI/CDIFF/Recent sternal rewire    LOS: 26 days    GOLD,WAYNE E 03/02/2014  I have seen and examined the patient and agree with the assessment and plan as outlined.  Management of Afib per Cardiology team.  Currently back in NSR.  BP soft and some dizziness w/ attempts to ambulate.  Need to hold diuretics.  OWEN,CLARENCE H 03/02/2014 10:48 AM

## 2014-03-03 LAB — BODY FLUID CULTURE: Culture: NO GROWTH

## 2014-03-03 LAB — CULTURE, BLOOD (SINGLE): Culture: NO GROWTH

## 2014-03-03 LAB — BASIC METABOLIC PANEL
Anion gap: 3 — ABNORMAL LOW (ref 5–15)
BUN: 10 mg/dL (ref 6–23)
CO2: 28 mmol/L (ref 19–32)
Calcium: 7.9 mg/dL — ABNORMAL LOW (ref 8.4–10.5)
Chloride: 104 mmol/L (ref 96–112)
Creatinine, Ser: 1.48 mg/dL — ABNORMAL HIGH (ref 0.50–1.35)
GFR calc Af Amer: 52 mL/min — ABNORMAL LOW (ref >90)
GFR calc non Af Amer: 45 mL/min — ABNORMAL LOW (ref >90)
Glucose, Bld: 100 mg/dL — ABNORMAL HIGH (ref 70–99)
Potassium: 3.7 mmol/L (ref 3.5–5.1)
Sodium: 135 mmol/L (ref 135–145)

## 2014-03-03 LAB — MAGNESIUM: Magnesium: 1.7 mg/dL (ref 1.5–2.5)

## 2014-03-03 LAB — PROTIME-INR
INR: 2.48 — ABNORMAL HIGH (ref 0.00–1.49)
Prothrombin Time: 27.1 seconds — ABNORMAL HIGH (ref 11.6–15.2)

## 2014-03-03 MED ORDER — MAGNESIUM OXIDE 400 (241.3 MG) MG PO TABS
400.0000 mg | ORAL_TABLET | Freq: Two times a day (BID) | ORAL | Status: DC
  Administered 2014-03-03 – 2014-03-05 (×5): 400 mg via ORAL
  Filled 2014-03-03 (×7): qty 1

## 2014-03-03 MED ORDER — POTASSIUM CHLORIDE CRYS ER 20 MEQ PO TBCR
40.0000 meq | EXTENDED_RELEASE_TABLET | Freq: Every day | ORAL | Status: AC
  Administered 2014-03-03: 40 meq via ORAL
  Filled 2014-03-03: qty 2

## 2014-03-03 MED ORDER — FUROSEMIDE 10 MG/ML IJ SOLN
40.0000 mg | Freq: Once | INTRAMUSCULAR | Status: AC
  Administered 2014-03-03: 40 mg via INTRAVENOUS
  Filled 2014-03-03: qty 4

## 2014-03-03 NOTE — Progress Notes (Signed)
Removed JP drain per MD order per hospital policy. Patient tolerated well. 10 cc noted in bulb. Will continue to monitor closely.

## 2014-03-03 NOTE — Progress Notes (Addendum)
[address]       [place] [address]             [phone]      3 Days Post-Op Procedure(s) (LRB): Irrigation and Debridement of Sternum with Sternal Rewiring (N/A) Subjective: Feels fairly well  Objective: Vital signs in last 24 hours: Temp:  [97.5 F (36.4 C)-98.5 F (36.9 C)] 97.5 F (36.4 C) (02/28 0714) Pulse Rate:  [9-140] 9 (02/27 2100) Cardiac Rhythm:  [-] Normal sinus rhythm (02/27 2100) Resp:  [18] 18 (02/28 0714) BP: (67-137)/(46-86) 132/76 mmHg (02/28 0714) SpO2:  [92 %-93 %] 92 % (02/27 2100) Weight:  [225 lb 12 oz (102.4 kg)-226 lb 13.7 oz (102.9 kg)] 226 lb 13.7 oz (102.9 kg) (02/28 0714)  Hemodynamic parameters for last 24 hours:    Intake/Output from previous day: 02/27 0701 - 02/28 0700 In: 1660 [P.O.:360; IV Piggyback:1300] Out: 961 [Urine:900; Drains:60; Stool:1] Intake/Output this shift:    General appearance: alert, cooperative and no distress Heart: regular rate and rhythm Lungs: clear to auscultation bilaterally Abdomen: mild distension, soft, nontender Extremities: minimal edema Wound: chest incision dressing CDI, evh site healing well  Lab Results:  Recent Labs  02/28/14 1645 03/02/14 0516  WBC 11.1* 11.7*  HGB 8.4* 9.3*  HCT 26.9* 29.6*  PLT 430* 441*   BMET:  Recent Labs  03/02/14 0516 03/03/14 0543  NA 133* 135  K 3.4* 3.7  CL 103 104  CO2 26 28  GLUCOSE 116* 100*  BUN 13 10  CREATININE 1.68* 1.48*  CALCIUM 7.9* 7.9*    PT/INR:  Recent Labs  03/03/14 0543  LABPROT 27.1*  INR 2.48*   ABG    Component Value Date/Time   PHART 7.356 02/15/2014 0402   HCO3 22.4 02/15/2014 0402   TCO2 24 02/15/2014 0402   ACIDBASEDEF 3.0* 02/15/2014 0402   O2SAT 94.0 02/15/2014 0402   CBG (last 3)  No results for input(s): GLUCAP in the last 72 hours.  Meds Scheduled Meds: . allopurinol  150 mg Oral Daily  . amiodarone  100 mg Oral Daily  . atorvastatin  40 mg Oral q1800  . bisacodyl  10 mg  Oral Daily   Or  . bisacodyl  10 mg Rectal Daily  . carvedilol  3.125 mg Oral BID WC  . ciprofloxacin  400 mg Intravenous Q12H  . clopidogrel  75 mg Oral Daily  . colchicine  0.6 mg Oral Daily  . docusate sodium  200 mg Oral Daily  . feeding supplement (ENSURE COMPLETE)  237 mL Oral TID WC  . metroNIDAZOLE  500 mg Oral Q8H  . pantoprazole  40 mg Oral Daily  . saccharomyces boulardii  250 mg Oral BID  . simethicone  80 mg Oral QID  . sodium chloride  10-40 mL Intracatheter Q12H  . sodium chloride  3 mL Intravenous Q12H  . vancomycin  1,500 mg Intravenous Q24H  . warfarin  4 mg Oral q1800  . Warfarin - Physician Dosing Inpatient   Does not apply q1800   Continuous Infusions: . sodium chloride 20 mL/hr at 02/15/14 0300  . sodium chloride Stopped (02/26/14 2154)  . sodium chloride 10 mL/hr at 02/22/14 1400  . lactated ringers 10 mL/hr at 02/16/14 1800   PRN Meds:.levalbuterol, metoprolol, ondansetron (ZOFRAN) IV, oxyCODONE, sodium chloride, sodium chloride, traMADol  Xrays Dg Chest 2 View  03/01/2014   CLINICAL DATA:  Shortness of breath. Postop debridement of lower sternal incision with sternal  requiring.  EXAM: CHEST  2 VIEW  COMPARISON:  02/28/2014  FINDINGS: Right PICC remains in place with tip projecting over the lower SVC. Sequelae of prior CABG are again identified. There is mild enlargement of the cardiac silhouette. The patient has taken a greater inspiration than on the prior study. Minimal opacity is present in the right lung base, slightly improved from prior. There are small right and small-to-moderate left pleural effusions, likely not significantly changed. Curvilinear opacities in the left lung base are most suggestive of subsegmental atelectasis. No segmental airspace consolidation, edema, or pneumothorax is identified. A surgical drain is noted deep to the sternum.  IMPRESSION: 1. Greater inspiration with subsegmental atelectasis in the left greater than right lung bases.  2. Small right and small-to-moderate left pleural effusions.   Electronically Signed   By: Sebastian AcheAllen  Grady   On: 03/01/2014 08:50    Assessment/Plan: S/P Procedure(s) (LRB): Irrigation and Debridement of Sternum with Sternal Rewiring (N/A)  1 rhythm /BP issues persist, cardiology is assisting with management. Diuretics stopped 2 creat and K+ improved, cont Mg++ 3 On coumadin and plavix 4 On  Flagyl for cdiff, was on vanco po prior to that- probably can d/c soon 5 Cipro IV for UTI- prob can d/c after todays doses 6 ? Duration of IV vanco  7  D/c jp drain- minimal at this point  LOS: 27 days    GOLD,WAYNE E 03/03/2014  I have seen and examined the patient and agree with the assessment and plan as outlined.  OWEN,CLARENCE H 03/03/2014 11:03 AM

## 2014-03-03 NOTE — Progress Notes (Signed)
   Subjective: Feels a little better  No CP  Breathing is OK   Objective: Filed Vitals:   03/02/14 0902 03/02/14 1330 03/02/14 2100 03/03/14 0714  BP: 67/46 100/74 137/86 132/76  Pulse: 140 100 9   Temp:  98.5 F (36.9 C) 98.5 F (36.9 C) 97.5 F (36.4 C)  TempSrc:  Oral Oral Oral  Resp:  18 18 18   Height:      Weight:    226 lb 13.7 oz (102.9 kg)  SpO2:  93% 92%    Weight change:   Intake/Output Summary (Last 24 hours) at 03/03/14 1017 Last data filed at 03/03/14 0700  Gross per 24 hour  Intake    820 ml  Output    960 ml  Net   -140 ml    General: Alert, awake, oriented x3, in no acute distress Neck:  JVP is normal Heart: Regular rate and rhythm, without murmurs, rubs, gallops.  Lungs: Clear to auscultation.  No rales or wheezes. Exemities:  1+ edema.   Neuro: Grossly intact, nonfocal.  Tele:  Afib and SR  Lab Results: Results for orders placed or performed during the hospital encounter of 02/04/14 (from the past 24 hour(s))  Protime-INR     Status: Abnormal   Collection Time: 03/03/14  5:43 AM  Result Value Ref Range   Prothrombin Time 27.1 (H) 11.6 - 15.2 seconds   INR 2.48 (H) 0.00 - 1.49  Basic metabolic panel     Status: Abnormal   Collection Time: 03/03/14  5:43 AM  Result Value Ref Range   Sodium 135 135 - 145 mmol/L   Potassium 3.7 3.5 - 5.1 mmol/L   Chloride 104 96 - 112 mmol/L   CO2 28 19 - 32 mmol/L   Glucose, Bld 100 (H) 70 - 99 mg/dL   BUN 10 6 - 23 mg/dL   Creatinine, Ser 0.981.48 (H) 0.50 - 1.35 mg/dL   Calcium 7.9 (L) 8.4 - 10.5 mg/dL   GFR calc non Af Amer 45 (L) >90 mL/min   GFR calc Af Amer 52 (L) >90 mL/min   Anion gap 3 (L) 5 - 15  Magnesium     Status: None   Collection Time: 03/03/14  5:43 AM  Result Value Ref Range   Magnesium 1.7 1.5 - 2.5 mg/dL    Studies/Results: No results found.  Medications: Reviewed   @PROBHOSP @  Atrial fib  patinet is in and out of SR  Would continue on current regimen    2.  CAD  Stable Would  give 1 dose IV lasix today and follow    3  HTN  BP is better than yesterday .    LOS: 27 days   [name] 03/03/2014, 10:17 AM

## 2014-03-03 NOTE — Progress Notes (Signed)
Patient ambulated 600 feet with RW. Patient tolerated well with no dizziness. Patient returned to bed with call bell with in reach.

## 2014-03-03 NOTE — Progress Notes (Signed)
Patient ambulated 600 ft with RW, Patient tolerated well with no dizziness. Patient returned to the chair with call bell within reach.

## 2014-03-04 ENCOUNTER — Inpatient Hospital Stay (HOSPITAL_COMMUNITY): Payer: Medicare Other

## 2014-03-04 ENCOUNTER — Encounter (HOSPITAL_COMMUNITY): Payer: Self-pay | Admitting: Cardiothoracic Surgery

## 2014-03-04 LAB — PROTIME-INR
INR: 2.63 — ABNORMAL HIGH (ref 0.00–1.49)
Prothrombin Time: 28.3 seconds — ABNORMAL HIGH (ref 11.6–15.2)

## 2014-03-04 LAB — TYPE AND SCREEN
ABO/RH(D): O POS
Antibody Screen: NEGATIVE
Unit division: 0
Unit division: 0

## 2014-03-04 MED ORDER — AMIODARONE HCL 200 MG PO TABS
200.0000 mg | ORAL_TABLET | Freq: Two times a day (BID) | ORAL | Status: DC
  Filled 2014-03-04 (×2): qty 1

## 2014-03-04 MED ORDER — WARFARIN SODIUM 2.5 MG PO TABS
2.5000 mg | ORAL_TABLET | Freq: Every day | ORAL | Status: DC
  Administered 2014-03-04: 2.5 mg via ORAL
  Filled 2014-03-04 (×2): qty 1

## 2014-03-04 MED ORDER — AMIODARONE HCL 200 MG PO TABS
400.0000 mg | ORAL_TABLET | Freq: Two times a day (BID) | ORAL | Status: DC
  Administered 2014-03-04 – 2014-03-05 (×3): 400 mg via ORAL
  Filled 2014-03-04 (×4): qty 2

## 2014-03-04 MED ORDER — CIPROFLOXACIN HCL 500 MG PO TABS: 500.0000 mg | ORAL_TABLET | Freq: Two times a day (BID) | ORAL | Status: DC

## 2014-03-04 MED ORDER — CIPROFLOXACIN HCL 500 MG PO TABS
500.0000 mg | ORAL_TABLET | Freq: Two times a day (BID) | ORAL | Status: DC
  Administered 2014-03-04 – 2014-03-05 (×2): 500 mg via ORAL
  Filled 2014-03-04 (×4): qty 1

## 2014-03-04 NOTE — Progress Notes (Signed)
CARDIAC REHAB PHASE I   PRE:  Rate/Rhythm: 92 Afib  BP:  Supine:   Sitting: 96/55  Standing:    SaO2: 94 RA  MODE:  Ambulation: 890 ft   POST:  Rate/Rhythm: 106  BP:  Supine:   Sitting: 120/69  Standing:    SaO2: 92 RA [id number] Assisted X 1 and used rollator to ambulate. Pt denies any dizziness standing. Pt was able to walk 890 feet without c/o. VS stable Pt back to recliner after walk with call light in reach.  Melina CopaLisa Christell Steinmiller RN 03/04/2014 11:09 AM

## 2014-03-04 NOTE — Progress Notes (Addendum)
SUBJECTIVE:  No complaints.  Anxious to go home  OBJECTIVE:   Vitals:   Filed Vitals:   03/03/14 2012 03/03/14 2015 03/04/14 0429 03/04/14 0626  BP: 88/69 120/82  119/76  Pulse: 124 102  95  Temp: 99 F (37.2 C) 98.5 F (36.9 C)  98.5 F (36.9 C)  TempSrc: Oral Oral  Oral  Resp: 16 18  18   Height:      Weight:   221 lb 14.4 oz (100.653 kg)   SpO2: 94% 93%  92%   I&O's:   Intake/Output Summary (Last 24 hours) at 03/04/14 1610 Last data filed at 03/03/14 2007  Gross per 24 hour  Intake    710 ml  Output    350 ml  Net    360 ml   TELEMETRY: Reviewed telemetry pt in NSR with frequent PAF     PHYSICAL EXAM General: Well developed, well nourished, in no acute distress Head: Eyes PERRLA, No xanthomas.   Normal cephalic and atramatic  Lungs:   Clear bilaterally to auscultation and percussion. Heart:   HRRR S1 S2 Pulses are 2+ & equal. Abdomen: Bowel sounds are positive, abdomen soft and non-tender without masses  Extremities:   No clubbing, cyanosis or edema.  DP +1 Neuro: Alert and oriented X 3. Psych:  Good affect, responds appropriately   LABS: Basic Metabolic Panel:  Recent Labs  96/04/54 0516 03/03/14 0543  NA 133* 135  K 3.4* 3.7  CL 103 104  CO2 26 28  GLUCOSE 116* 100*  BUN 13 10  CREATININE 1.68* 1.48*  CALCIUM 7.9* 7.9*  MG  --  1.7   Liver Function Tests: No results for input(s): AST, ALT, ALKPHOS, BILITOT, PROT, ALBUMIN in the last 72 hours. No results for input(s): LIPASE, AMYLASE in the last 72 hours. CBC:  Recent Labs  03/02/14 0516  WBC 11.7*  HGB 9.3*  HCT 29.6*  MCV 93.7  PLT 441*   Cardiac Enzymes: No results for input(s): CKTOTAL, CKMB, CKMBINDEX, TROPONINI in the last 72 hours. BNP: Invalid input(s): POCBNP D-Dimer: No results for input(s): DDIMER in the last 72 hours. Hemoglobin A1C: No results for input(s): HGBA1C in the last 72 hours. Fasting Lipid Panel: No results for input(s): CHOL, HDL, LDLCALC, TRIG, CHOLHDL,  LDLDIRECT in the last 72 hours. Thyroid Function Tests: No results for input(s): TSH, T4TOTAL, T3FREE, THYROIDAB in the last 72 hours.  Invalid input(s): FREET3 Anemia Panel: No results for input(s): VITAMINB12, FOLATE, FERRITIN, TIBC, IRON, RETICCTPCT in the last 72 hours. Coag Panel:   Lab Results  Component Value Date   INR 2.63* 03/04/2014   INR 2.48* 03/03/2014   INR 2.32* 03/02/2014    RADIOLOGY: Dg Chest 2 View  03/01/2014   CLINICAL DATA:  Shortness of breath. Postop debridement of lower sternal incision with sternal requiring.  EXAM: CHEST  2 VIEW  COMPARISON:  02/28/2014  FINDINGS: Right PICC remains in place with tip projecting over the lower SVC. Sequelae of prior CABG are again identified. There is mild enlargement of the cardiac silhouette. The patient has taken a greater inspiration than on the prior study. Minimal opacity is present in the right lung base, slightly improved from prior. There are small right and small-to-moderate left pleural effusions, likely not significantly changed. Curvilinear opacities in the left lung base are most suggestive of subsegmental atelectasis. No segmental airspace consolidation, edema, or pneumothorax is identified. A surgical drain is noted deep to the sternum.  IMPRESSION: 1. Greater inspiration with  subsegmental atelectasis in the left greater than right lung bases. 2. Small right and small-to-moderate left pleural effusions.   Electronically Signed   By: Sebastian Ache   On: 03/01/2014 08:50   Dg Chest 2 View  02/25/2014   CLINICAL DATA:  Post CABG, hypertension, atrial fibrillation  EXAM: CHEST  2 VIEW  COMPARISON:  02/21/2014  FINDINGS: New RIGHT arm PICC line tip projects over SVC.  Enlargement of cardiac silhouette post CABG.  Mediastinal contours and pulmonary vascularity normal.  Emphysematous changes with bibasilar atelectasis and small effusions greater on LEFT.  Remaining lungs clear.  No pleural effusion or pneumothorax.  Epicardial  pacing wires again noted.  IMPRESSION: Mild enlargement of cardiac silhouette post CABG.  COPD changes with bibasilar atelectasis and small pleural effusions, greater on LEFT.   Electronically Signed   By: Ulyses Southward M.D.   On: 02/25/2014 08:00   Dg Chest 2 View  02/21/2014   CLINICAL DATA:  Postop cardiac surgery  EXAM: CHEST  2 VIEW  COMPARISON:  02/19/2014  FINDINGS: There is a right upper extremity PICC line with tip in the low SVC. There is sternotomy and CABG. Linear basilar opacities are present bilaterally, probably atelectatic. There is improvement, with less vascular and interstitial congestion.  IMPRESSION: Decreased vascular and interstitial congestion.   Electronically Signed   By: Ellery Plunk M.D.   On: 02/21/2014 06:16   Dg Chest 2 View  02/13/2014   CLINICAL DATA:  Preop for Coronary artery bypass graft.  EXAM: CHEST  2 VIEW  COMPARISON:  February 06, 2014.  FINDINGS: The heart size and mediastinal contours are within normal limits. No pneumothorax or pleural effusion is noted. Stable bibasilar interstitial densities are noted most consistent with subsegmental atelectasis or scar. No acute pulmonary disease is noted. Anterior osteophyte formation is noted in the lower thoracic spine.  IMPRESSION: Stable bibasilar subsegmental atelectasis or scarring.   Electronically Signed   By: Lupita Raider, M.D.   On: 02/13/2014 17:23   Dg Chest 2 View  02/06/2014   CLINICAL DATA:  Shortness of breath. Atrial fibrillation. Initial encounter.  EXAM: CHEST  2 VIEW  COMPARISON:  02/03/2014 radiographs.  FINDINGS: 0922 hr. The heart size and mediastinal contours are stable. There is stable linear bibasilar atelectasis or scarring. No edema, confluent airspace opacity or significant pleural effusion demonstrated. The bones appear unchanged.  IMPRESSION: Stable linear bibasilar atelectasis or scarring. No evidence of edema or acute process.   Electronically Signed   By: Roxy Horseman M.D.   On:  02/06/2014 10:10   Ct Chest Wo Contrast  02/28/2014   CLINICAL DATA:  Fourteen days postop CABG.  Evaluate sternal wires.  EXAM: CT CHEST WITHOUT CONTRAST  TECHNIQUE: Multidetector CT imaging of the chest was performed following the standard protocol without IV contrast.  COMPARISON:  02/10/2014  FINDINGS: Mediastinum: Heart size appears normal. There is calcified atherosclerotic plaque involving the thoracic aorta. Postoperative changes involving the anterior mediastinum consistent with recent sternotomy and CABG procedure. Nonspecific retrosternal fluid is identified this is contiguous with a larger fluid collection which surrounds the mid and distal sternum measuring approximately 14.2 x 3.4 cm x 4 cm. There is approximately 12.3 cm distal to the sternal notch there is distraction of the sternal components along the sternotomy site. The sternotomy wires do not appear fractured. The components are separated by approximately 2.3 cm, image 41/series 201.  The trachea appears patent and is midline. The esophagus appears within normal limits.  Lungs/Pleura: Small to moderate bilateral pleural effusions are noted left greater than right. No interstitial edema or airspace consolidation. There is mild atelectasis within the lung bases.  Upper Abdomen: The visualized portions of the liver are normal. The gallbladder is unremarkable. Left adrenal nodule is again identified, image 59 of series 201.  Musculoskeletal: Mild degenerative disc disease noted within the thoracic spine.  IMPRESSION: 1. Postoperative changes consistent with recent median sternotomy and CABG procedure. 2. Distraction of distal sternal components along the sternotomy site identified with surrounding fluid collection (likely hematoma). 3. Small to moderate bilateral pleural effusions, left greater than right.   Electronically Signed   By: Signa Kellaylor  Stroud M.D.   On: 02/28/2014 10:43   Ct Chest Wo Contrast  02/10/2014   CLINICAL DATA:  Chest pain,  preop CABG  EXAM: CT CHEST WITHOUT CONTRAST  TECHNIQUE: Multidetector CT imaging of the chest was performed following the standard protocol without IV contrast.  COMPARISON:  Chest radiographs dated 02/06/2014  FINDINGS: Mediastinum/Nodes: The heart is normal in size. No pericardial effusion.  Coronary atherosclerosis in the left main coronary artery and LAD.  Mild atherosclerotic calcifications of the aortic arch.  No suspicious mediastinal or axillary lymphadenopathy.  Visualized thyroid is unremarkable.  Lungs/Pleura: Evaluation of the lung parenchyma is constrained by respiratory motion.  No suspicious pulmonary nodules, noting motion degradation.  Atelectasis/scarring in the bilateral lower lobes. Suspected emphysematous changes.  No pleural effusion or pneumothorax.  Upper abdomen: Visualized upper abdomen is notable for a stable 2.0 x 1.5 cm left adrenal adenoma (series 201/image 58), multiple nonobstructing bilateral renal calculi measuring up to 5 mm, and two dominant left renal cysts measuring up to 2.3 cm.  Musculoskeletal: Degenerative changes of the visualized thoracolumbar spine.  IMPRESSION: No evidence of acute cardiopulmonary disease.  Coronary atherosclerosis in the left main coronary artery and LAD.  Ancillary findings as above.   Electronically Signed   By: Charline BillsSriyesh  Krishnan M.D.   On: 02/10/2014 13:44   Dg Chest Port 1 View  02/28/2014   CLINICAL DATA:  Status post incision and drainage of sternum with Re wiring, shortness of breath  EXAM: PORTABLE CHEST - 1 VIEW  COMPARISON:  02/28/2014 and 02/25/2014  FINDINGS: Significant enlarged of the cardiac silhouette stable. Status post CABG and median sternotomy. No change right PICC line. Right lung is clear except for mild lower lobe opacities suggesting atelectasis. Hazy density on the left again consistent with pleural effusion and underlying atelectasis.  IMPRESSION: Left effusion with bilateral lower lobe atelectasis   Electronically Signed    By: Esperanza Heiraymond  Rubner M.D.   On: 02/28/2014 16:36   Dg Chest Port 1 View  02/19/2014   CLINICAL DATA:  74 year old male PICC line placement. Initial encounter.  EXAM: PORTABLE CHEST - 1 VIEW  COMPARISON:  0545 hours the same day and earlier.  FINDINGS: Portable AP semi upright view at 1246 hours. Right upper extremity approach PICC line catheter placed. Tip a bowel 1 vertebral body below the carina. Stable cardiac size and mediastinal contours. Lower lung volumes. Otherwise stable ventilation. No pneumothorax.  IMPRESSION: Right PICC line placed, tip at the lower SVC level. Otherwise stable chest.   Electronically Signed   By: Odessa FlemingH  Hall M.D.   On: 02/19/2014 13:19   Dg Chest Port 1 View  02/19/2014   CLINICAL DATA:  CABG.  EXAM: PORTABLE CHEST - 1 VIEW  COMPARISON:  02/17/2014.  FINDINGS: Left IJ sheath in good anatomic position. Mediastinum and hilar structures are  normal. Prior CABG. Cardiomegaly with normal pulmonary vascularity. Bibasilar atelectasis with small left pleural effusion again noted. No acute bony abnormality identified.  IMPRESSION: 1. Left IJ sheath in stable position. 2. Bibasilar atelectasis and small left pleural effusion. No interim change. 3. Cardiomegaly with normal pulmonary vascularity.  Prior CABG.   Electronically Signed   By: Maisie Fus  Register   On: 02/19/2014 07:20   Dg Chest Port 1 View  02/17/2014   CLINICAL DATA:  Status post CABG.  EXAM: PORTABLE CHEST - 1 VIEW  COMPARISON:  02/16/2014  FINDINGS: Left IJ central line sheath remains in place. Chest tube has been removed. Status post median sternotomy and CABG.  The heart is enlarged. There is opacity at the left lung base consistent with atelectasis and pleural effusion. No pneumothorax following removal of chest tube.  IMPRESSION: 1. Status post removal of left-sided chest tube.  No pneumothorax. 2. Cardiomegaly and left lower lobe atelectasis or infiltrate, pleural effusion.   Electronically Signed   By: Norva Pavlov M.D.    On: 02/17/2014 07:36   Dg Chest Port 1 View  02/16/2014   CLINICAL DATA:  Status post CABG  EXAM: PORTABLE CHEST - 1 VIEW  COMPARISON:  02/15/2014  FINDINGS: Interval removal of Swan-Ganz catheter. The sheath has likely been clamped in the neck. Left-sided thoracic drain is in stable position.  There is no definite or enlarging left pneumothorax. Pulmonary venous congestion appears decreased.  Unchanged cardiomegaly and upper mediastinal widening post CABG. Bibasilar atelectasis, greater on the left, and left pleural effusion is unchanged.  IMPRESSION: 1. No definite or increasing left pneumothorax. 2. Unchanged atelectasis and small pleural effusion on the left.   Electronically Signed   By: Marnee Spring M.D.   On: 02/16/2014 07:11   Dg Chest Port 1 View  02/15/2014   CLINICAL DATA:  Status post Coronary artery bypass graft x4.  EXAM: PORTABLE CHEST - 1 VIEW  COMPARISON:  February 14, 2014.  FINDINGS: Stable cardiomediastinal silhouette. Endotracheal tube has been removed. Left internal jugular Swan-Ganz catheter is noted with tip directed toward right pulmonary artery. Left-sided chest tube is noted with minimal left apical pneumothorax. Right lung is clear. Stable left basilar opacity is noted most consistent with atelectasis with associated pleural effusion.  IMPRESSION: Stable left basilar subsegmental atelectasis with associated pleural effusion. Endotracheal tube has been removed. Left-sided chest tube is again noted with minimal left apical pneumothorax now seen.   Electronically Signed   By: Lupita Raider, M.D.   On: 02/15/2014 07:56   Dg Chest Portable 1 View  02/14/2014   CLINICAL DATA:  Hypoxia status post coronary artery bypass graft.  EXAM: PORTABLE CHEST - 1 VIEW  COMPARISON:  February 13, 2014.  FINDINGS: Endotracheal tube is in grossly good position with distal tip 3.6 cm above the carina. Left internal jugular Swan-Ganz catheter is noted with distal tip directed toward right pulmonary  artery. Left-sided chest tube is noted without pneumothorax. Minimal right basilar subsegmental atelectasis is noted. Left basilar opacity is noted concerning for atelectasis with associated pleural effusion.  IMPRESSION: No pneumothorax is seen status post coronary artery bypass graft. Left-sided chest tube is in grossly good position. Minimal right basilar subsegmental atelectasis is noted. Left lower lobe opacity is noted concerning for atelectasis with possible associated pleural effusion.   Electronically Signed   By: Lupita Raider, M.D.   On: 02/14/2014 14:22    ASSESSMENT/PLAN: 1.  Paroxysmal atrial fibrillation - still frequently in and  out of afib.  Continue Amio/BB/warfarin.  Will increase Amio to  BID x 1 week then  daily for 2 weeks then  daily.  Recheck EKG today to check QTc which was prolonged on EKG on 2/24   2.  ASCAD s/p CABG x 4 - stable with no angina.  Continue BB/statin 3.  Acute on chronic RF - creatinine improving 4.  HTN - controlled.  Continue BB   Quintella Reichert, MD  03/04/2014  8:28 AM

## 2014-03-04 NOTE — Progress Notes (Addendum)
       [address]       [place] [id number]             [phone]          4 Days Post-Op Procedure(s) (LRB): Irrigation and Debridement of Sternum with Sternal Rewiring (N/A)  Subjective: Feels ok this am.  Got dizzy when up walking last night, RN noted at that time that HR was elevated and BPs were low.  Stools normalizing. Appetite ok.  Breathing stable.   Objective: Vital signs in last 24 hours: Patient Vitals for the past 24 hrs:  BP Temp Temp src Pulse Resp SpO2 Weight  03/04/14 0626 119/76 mmHg 98.5 F (36.9 C) Oral 95 18 92 % -  03/04/14 0429 - - - - - - 221 lb 14.4 oz (100.653 kg)  03/03/14 2015 120/82 mmHg 98.5 F (36.9 C) Oral (!) 102 18 93 % -  03/03/14 2012 (!) 88/69 mmHg 99 F (37.2 C) Oral (!) 124 16 94 % -  03/03/14 1350 115/77 mmHg 98.6 F (37 C) Oral (!) 119 18 96 % -   Current Weight  03/04/14 221 lb 14.4 oz (100.653 kg)  PRE-OPERATIVE WEIGHT: 103kg   Intake/Output from previous day: 02/28 0701 - 02/29 0700 In: 1150 [P.O.:240; I.V.:10; IV Piggyback:900] Out: 350 [Urine:350]    PHYSICAL EXAM:  Heart: Irr irr Lungs: Clear Wound: Dressed and dry, sternum stable Extremities: Mild ankle edema    Lab Results: CBC: Recent Labs  03/02/14 0516  WBC 11.7*  HGB 9.3*  HCT 29.6*  PLT 441*   BMET:  Recent Labs  03/02/14 0516 03/03/14 0543  NA 133* 135  K 3.4* 3.7  CL 103 104  CO2 26 28  GLUCOSE 116* 100*  BUN 13 10  CREATININE 1.68* 1.48*  CALCIUM 7.9* 7.9*    PT/INR:  Recent Labs  03/04/14 0500  LABPROT 28.3*  INR 2.63*      Assessment/Plan: S/P Procedure(s) (LRB): Irrigation and Debridement of Sternum with Sternal Rewiring (N/A)  CV- Continues with intermittent AF. BPs have generally been stable. Continue current meds, cardiology following.  A/CRF- Cr stable.  C diff colitis- Symptomatically improving. Continue Flagyl.  UTI- Continue Cipro.  Sternal instability- stable presently after  rewiring. Continue IV Vanc.  CRPI, pulm toilet.   LOS: 28 days    [name],[name] 03/04/2014  Sternal incision clean and dry after rewiring- OR cultures  neg afib this am 110/min, Dr Johney FrameAllred rec amio 200 bid C diff bet\ter Will plan DC home with HHN in am- trerat afib as outpt Stop Cipro, vanc at DC and remove PICC Cont po flagyl for 5 days at home Office appt with me for staple removal Mar 9 Needs plavix for a month after cor endarterectomy, then resume ASA

## 2014-03-04 NOTE — Progress Notes (Addendum)
Medicare Important Message given? YES (If response is "NO", the following Medicare IM given date fields will be blank) Date Medicare IM given:03/04/2014 Medicare IM given by: Sulo Janczak 

## 2014-03-05 LAB — ANAEROBIC CULTURE

## 2014-03-05 MED ORDER — CLOPIDOGREL BISULFATE 75 MG PO TABS: 75.0000 mg | ORAL_TABLET | Freq: Every day | ORAL | Status: DC

## 2014-03-05 MED ORDER — SACCHAROMYCES BOULARDII 250 MG PO CAPS: 250.0000 mg | ORAL_CAPSULE | Freq: Two times a day (BID) | ORAL | Status: DC

## 2014-03-05 MED ORDER — OXYCODONE HCL 5 MG PO TABS: 5.0000 mg | ORAL_TABLET | ORAL | Status: DC | PRN

## 2014-03-05 MED ORDER — AMIODARONE HCL 200 MG PO TABS: 200.0000 mg | ORAL_TABLET | Freq: Two times a day (BID) | ORAL | Status: DC

## 2014-03-05 MED ORDER — WARFARIN SODIUM 5 MG PO TABS: ORAL_TABLET | ORAL | Status: DC

## 2014-03-05 MED ORDER — METRONIDAZOLE 500 MG PO TABS: 500.0000 mg | ORAL_TABLET | Freq: Three times a day (TID) | ORAL | Status: DC

## 2014-03-05 MED ORDER — ATORVASTATIN CALCIUM 40 MG PO TABS: 40.0000 mg | ORAL_TABLET | Freq: Every day | ORAL | Status: DC

## 2014-03-05 MED ORDER — CARVEDILOL 3.125 MG PO TABS: 3.1250 mg | ORAL_TABLET | Freq: Two times a day (BID) | ORAL | Status: DC

## 2014-03-05 MED ORDER — ALLOPURINOL 300 MG PO TABS: 150.0000 mg | ORAL_TABLET | Freq: Every day | ORAL | Status: DC

## 2014-03-05 NOTE — Progress Notes (Signed)
       [address]       [place] [id number]             [phone]          5 Days Post-Op Procedure(s) (LRB): Irrigation and Debridement of Sternum with Sternal Rewiring (N/A)  Subjective: Feels well, no complaints.   Objective: Vital signs in last 24 hours: Patient Vitals for the past 24 hrs:  BP Temp Temp src Pulse Resp SpO2 Weight  03/05/14 0500 113/75 mmHg 98.4 F (36.9 C) Oral (!) 115 18 96 % 220 lb 8 oz (100.018 kg)  03/04/14 1900 124/83 mmHg 98.5 F (36.9 C) Oral 91 18 93 % -  03/04/14 1619 128/69 mmHg - - 92 - - -  03/04/14 1345 111/65 mmHg 98.3 F (36.8 C) - 92 19 94 % -   Current Weight  03/05/14 220 lb 8 oz (100.018 kg)     Intake/Output from previous day: 02/29 0701 - 03/01 0700 In: 240 [P.O.:240] Out: 1050 [Urine:1050]    PHYSICAL EXAM:  Heart: RRR Lungs: Clear Wound: Clean and dry, sternum  stable Extremities: Mild LE edema    Lab Results: CBC:No results for input(s): WBC, HGB, HCT, PLT in the last 72 hours. BMET:  Recent Labs  03/03/14 0543  NA 135  K 3.7  CL 104  CO2 28  GLUCOSE 100*  BUN 10  CREATININE 1.48*  CALCIUM 7.9*    PT/INR:  Recent Labs  03/04/14 0500  LABPROT 28.3*  INR 2.63*      Assessment/Plan: S/P Procedure(s) (LRB): Irrigation and Debridement of Sternum with Sternal Rewiring (N/A) Overall stable and doing well. Plan d/c home today- instructions reviewed with patient.   LOS: 29 days    Kiffany Schelling H 03/05/2014

## 2014-03-05 NOTE — Progress Notes (Signed)
CARDIAC REHAB PHASE I   Ed completed with pt and wife. Voiced understanding and [facility]. Doing well and ready for d/c. [id number]  Harriet MassonReeve, Vonita Calloway Kristan CES, ACSM 03/05/2014 10:05 AM

## 2014-03-07 ENCOUNTER — Ambulatory Visit (INDEPENDENT_AMBULATORY_CARE_PROVIDER_SITE_OTHER): Payer: Medicare Other | Admitting: *Deleted

## 2014-03-07 DIAGNOSIS — Z5181 Encounter for therapeutic drug level monitoring: Secondary | ICD-10-CM | POA: Diagnosis not present

## 2014-03-07 DIAGNOSIS — Z7901 Long term (current) use of anticoagulants: Secondary | ICD-10-CM | POA: Diagnosis not present

## 2014-03-07 DIAGNOSIS — I4892 Unspecified atrial flutter: Secondary | ICD-10-CM | POA: Diagnosis not present

## 2014-03-07 DIAGNOSIS — I4891 Unspecified atrial fibrillation: Secondary | ICD-10-CM | POA: Diagnosis not present

## 2014-03-07 LAB — POCT INR: INR: 2.4

## 2014-03-11 ENCOUNTER — Ambulatory Visit (INDEPENDENT_AMBULATORY_CARE_PROVIDER_SITE_OTHER): Payer: Medicare Other | Admitting: *Deleted

## 2014-03-11 ENCOUNTER — Other Ambulatory Visit: Payer: Self-pay | Admitting: Cardiothoracic Surgery

## 2014-03-11 DIAGNOSIS — Z951 Presence of aortocoronary bypass graft: Secondary | ICD-10-CM

## 2014-03-11 DIAGNOSIS — Z5181 Encounter for therapeutic drug level monitoring: Secondary | ICD-10-CM

## 2014-03-11 LAB — POCT INR: INR: 1.9

## 2014-03-13 ENCOUNTER — Ambulatory Visit
Admission: RE | Admit: 2014-03-13 | Discharge: 2014-03-13 | Disposition: A | Discharge status: Home / Self Care | Payer: Medicare Other | Source: Ambulatory Visit | Attending: Cardiothoracic Surgery | Admitting: Cardiothoracic Surgery

## 2014-03-13 ENCOUNTER — Encounter: Payer: Self-pay | Admitting: Cardiothoracic Surgery

## 2014-03-13 ENCOUNTER — Ambulatory Visit (INDEPENDENT_AMBULATORY_CARE_PROVIDER_SITE_OTHER): Payer: Self-pay | Admitting: Cardiothoracic Surgery

## 2014-03-13 VITALS — BP 130/76 | HR 90 | Resp 20 | Ht 71.0 in | Wt 220.0 lb

## 2014-03-13 DIAGNOSIS — Z951 Presence of aortocoronary bypass graft: Secondary | ICD-10-CM

## 2014-03-13 NOTE — Progress Notes (Signed)
PCP is [name], MD Referring Provider is Tonny Bollman, MD  Chief Complaint  Patient presents with  . Routine Post Op    f/u from surgery with CXR, s/p cabg x4 02/15/14, s/p Debridement of lower sternal incision with sternal rewiring 02/28/14    [id number] postop visit after urgent CABG x4 after non-ST elevation MI. Mild LV dysfunction preop.coronary endarterectomy of right coronary was required. He was started on Plavix postop  Postoperatively the patient developed atrial fibrillation requiring amiodarone and then Coumadin. The patient developed C. Difficile colitis with severe abdominal distention, gout, fluid retention and elevated creatinine. apProximally one week after surgery it  was noted his lower sternum is unstable and CT scan showed the lower sternal wires had cut through the sternal edge probably related to his abdominal ileus-distention. He required sternal reexploration, irrigation and rewiring for sternal separation . Operative cultures were negative.  Since returning home he has had problems with gout. His ankle edema has improved and he has lost 5 pounds. There is been no sternal drainage.he is ambulating further now and feels stronger He is having formed stools  Past Medical History  Diagnosis Date  . Paroxysmal atrial fibrillation     a. On Coumadin therapy/amidarone. b. 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  [name] CAD (coronary artery disease)     a, 2011: occluded LAD with left to right collaterals and moderate disease in the right coronary artery and circumflex, EF 50%. b. NSTEMI 07/2011 in setting of AF-RVR, MV abnl, cath w/ LAD 100%, RCA mod-severe dz, small vessel, med rx  . Hypertension     long-standing  . Chronic renal insufficiency     followed by primary care physician  . Pulmonary embolism   . Gout   . Anginal pain   . Myocardial infarction     Past Surgical History  Procedure Laterality Date  . Cardiac catheterization   2011 & 2013    2013 [place], LAD 100%, CFX 50%, RCA 80%, small vessel, med rx  . Nm myoview ltd  2013    Scar in the anteroseptal/periapical distribution with moderate peri-infarct ischemia, EF 43%  . Left heart catheterization with coronary angiogram N/A 02/08/2014    Procedure: LEFT HEART CATHETERIZATION WITH CORONARY ANGIOGRAM;  Surgeon: Micheline Chapman, MD;  Location: [facility] CATH LAB;  Service: Cardiovascular;  Laterality: N/A;  . Coronary artery bypass graft N/A 02/14/2014    Procedure: CORONARY ARTERY BYPASS GRAFTING (CABG)X4 LIMA-LAD; SVG-RCA(ENDARDERECTOMY); SVG-OM; SVG-DIAG;  Surgeon: Kerin Perna, MD;  Location: [facility] OR;  Service: Open Heart Surgery;  Laterality: N/A;  . Tee without cardioversion N/A 02/14/2014    Procedure: TRANSESOPHAGEAL ECHOCARDIOGRAM (TEE);  Surgeon: Kerin Perna, MD;  Location: [facility] OR;  Service: Open Heart Surgery;  Laterality: N/A;  . Total knee arthroplasty Bilateral   . Appendectomy    . Sternal incision reclosure N/A 02/28/2014    Procedure: Irrigation and Debridement of Sternum with Sternal Rewiring;  Surgeon: Kerin Perna, MD;  Location: [facility] OR;  Service: Thoracic;  Laterality: N/A;    Family History  Problem Relation Age of Onset  . Coronary artery disease Brother     MI    Social History History  Substance Use Topics  . Smoking status: Former Smoker    Types: Cigarettes    Quit date: 01/04/1966  . Smokeless tobacco: Former Neurosurgeon    Types: Chew    Quit date: 01/05/2007     Comment: quit chewing tobacco  about 4 yrs ago  . Alcohol Use: Yes     Comment: "seldomly drink beer" 1 every 2-3 weeks    Current Outpatient Prescriptions  Medication Sig Dispense Refill  . allopurinol (ZYLOPRIM) 300 MG tablet Take 0.5 tablets (150 mg total) by mouth daily. 30 tablet 1  . amiodarone (PACERONE) 200 MG tablet Take 1 tablet (200 mg total) by mouth 2 (two) times daily. 60 tablet 1  . atorvastatin (LIPITOR) 40 MG tablet Take 1 tablet (40 mg total) by mouth  daily at 6 PM. 30 tablet 1  . carvedilol (COREG) 3.125 MG tablet Take 1 tablet (3.125 mg total) by mouth 2 (two) times daily with a meal. 60 tablet 1  . clopidogrel (PLAVIX) 75 MG tablet Take 1 tablet (75 mg total) by mouth daily. 30 tablet 0  . COLCRYS 0.6 MG tablet Take 1 tablet by mouth 2 (two) times daily as needed. For gout    . loratadine (CLARITIN) 10 MG tablet Take 10 mg by mouth daily.    . metroNIDAZOLE (FLAGYL) 500 MG tablet Take 1 tablet (500 mg total) by mouth every 8 (eight) hours. X 5 days 15 tablet 0  . multivitamin (THERAGRAN) per tablet Take 1 tablet by mouth daily.      . nitroGLYCERIN (NITROSTAT) 0.4 MG SL tablet Place 1 tablet (0.4 mg total) under the tongue every 5 (five) minutes x 3 doses as needed for chest pain. 25 tablet 2  . oxyCODONE (OXY IR/ROXICODONE) 5 MG immediate release tablet Take 1-2 tablets (5-10 mg total) by mouth every 3 (three) hours as needed for severe pain. 30 tablet 0  . saccharomyces boulardii (FLORASTOR) 250 MG capsule Take 1 capsule (250 mg total) by mouth 2 (two) times daily. 60 capsule 0  . warfarin (COUMADIN) 5 MG tablet Take 2.5 mg daily or as directed by [facility] 40 tablet 4   No current facility-administered medications for this visit.    No Known Allergies  Review of Systems  No fever No recurrent angina Appetite slowly improving No serious bleeding complications from Coumadin  BP 130/76 mmHg  Pulse 90  Resp 20  Ht 5\' 11"  (1.803 m)  Wt 220 lb (99.791 kg)  BMI 30.70 kg/m2  SpO2 95% Physical Exam Alert and appears stronger Diminished breath sounds left base Heart rhythm regular-sinus rhythm Abdomen soft nontender Lower surgeries with 1-2+ edema Sternal incision well-healed and stable, every other skin stable removed  Diagnostic Tests: Chest x-ray shows sternal wires intact, small left pleural effusion with blunting of left costophrenic angle  Impression:  initial progress after prolonged recovery with several  complicating issues including C. Difficile colitis and ileus, fluid retention and elevated creatinine, gout, atrial fibrillation and lower sternal disruption requiring rewiring. LV the patient a short course of oral metolazone to help mobilize some of his edema. We will stop his Plavix which was given or 30 days for the endarterectomy and he will start aspirin 81 mg daily. He'll need Coumadin for at least 3 months but at least he is in sinus rhythm now. He denies any bleeding complications from the Coumadin  Plan: Continue home therapy i Stop Plavix, continue Coumadin and 81 ASA He can start driving after the remainder of his sternal staples are removed back in approximately 2 weeks with a chest x-ray to followup the left pleural effusion  Mikey BussingPeter Van Trigt III, MD [facility] [phone]

## 2014-03-18 ENCOUNTER — Ambulatory Visit (INDEPENDENT_AMBULATORY_CARE_PROVIDER_SITE_OTHER): Payer: Medicare Other | Admitting: *Deleted

## 2014-03-18 DIAGNOSIS — Z5181 Encounter for therapeutic drug level monitoring: Secondary | ICD-10-CM

## 2014-03-18 LAB — POCT INR: INR: 1.4

## 2014-03-19 ENCOUNTER — Encounter (HOSPITAL_COMMUNITY): Payer: Self-pay

## 2014-03-19 ENCOUNTER — Encounter (HOSPITAL_COMMUNITY)
Admission: RE | Admit: 2014-03-19 | Discharge: 2014-03-19 | Disposition: A | Discharge status: Home / Self Care | Payer: Medicare Other | Source: Ambulatory Visit | Attending: Cardiovascular Disease | Admitting: Cardiovascular Disease

## 2014-03-19 VITALS — BP 118/78 | HR 78 | Ht 71.0 in | Wt 207.3 lb

## 2014-03-19 DIAGNOSIS — I252 Old myocardial infarction: Secondary | ICD-10-CM | POA: Insufficient documentation

## 2014-03-19 DIAGNOSIS — Z951 Presence of aortocoronary bypass graft: Secondary | ICD-10-CM | POA: Insufficient documentation

## 2014-03-19 DIAGNOSIS — I2510 Atherosclerotic heart disease of native coronary artery without angina pectoris: Secondary | ICD-10-CM | POA: Insufficient documentation

## 2014-03-19 NOTE — Patient Instructions (Signed)
Pt has finished orientation and is scheduled to start CR on 03/24/13 at 9:30________ at _______. Pt has been instructed to arrive to class 15 minutes early for scheduled class. Pt has been instructed to wear comfortable clothing and shoes with rubber soles. Pt has been told to take their medications 1 hour prior to coming to class.  If the patient is not going to attend class, he/she has been instructed to call.

## 2014-03-19 NOTE — Progress Notes (Signed)
Patient was referred to Cardiac Rehab by Dr. Excell Seltzerooper post CABGx4 Z95.1. During orientation advised patient on arrival and appointment times what to wear, what to do before, during and after exercise. Reviewed attendance and class policy. Talked about inclement weather and class consultation policy. Pt is scheduled to start Cardiac Rehab on 03/25/14 at 9:30 am. Pt was advised to come to class 5 minutes before class starts. He was also given instructions on meeting with the dietician and attending the Family Structure classes. Pt is eager to get started. Patient was able to finish 6 minute walk test.

## 2014-03-19 NOTE — Progress Notes (Signed)
Cardiac/Pulmonary Rehab Medication Review by a Pharmacist  Does the patient  feel that his/her medications are working for him/her?  yes  Has the patient been experiencing any side effects to the medications prescribed?  no  Does the patient measure his/her own blood pressure or blood glucose at home?  no   Does the patient have any problems obtaining medications due to transportation or finances?   no  Understanding of regimen: excellent Understanding of indications: excellent Potential of compliance: excellent  Questions asked to Determine Patient Understanding of Medication Regimen:  1. What is the name of the medication?  2. What is the medication used for?  3. When should it be taken?  4. How much should be taken?  5. How will you take it?  6. What side effects should you report?  Understanding Defined as: Excellent: All questions above are correct Good: Questions 1-4 are correct Fair: Questions 1-2 are correct  Poor: 1 or none of the above questions are correct   Pharmacist comments: Pt is doing well with medications.  No problems identified.  Pt reports no side effects.  Gregory Davila, Gregory Davila A 03/19/2014 3:21 PM

## 2014-03-20 ENCOUNTER — Ambulatory Visit: Payer: Medicare Other | Admitting: Cardiothoracic Surgery

## 2014-03-21 ENCOUNTER — Ambulatory Visit (INDEPENDENT_AMBULATORY_CARE_PROVIDER_SITE_OTHER): Payer: Medicare Other | Admitting: Cardiovascular Disease

## 2014-03-21 ENCOUNTER — Encounter (HOSPITAL_COMMUNITY): Payer: Self-pay | Admitting: Cardiothoracic Surgery

## 2014-03-21 ENCOUNTER — Ambulatory Visit: Payer: Self-pay | Admitting: *Deleted

## 2014-03-21 VITALS — BP 124/76 | HR 88 | Ht 71.0 in | Wt 207.1 lb

## 2014-03-21 DIAGNOSIS — I2510 Atherosclerotic heart disease of native coronary artery without angina pectoris: Secondary | ICD-10-CM

## 2014-03-21 DIAGNOSIS — I259 Chronic ischemic heart disease, unspecified: Secondary | ICD-10-CM

## 2014-03-21 DIAGNOSIS — Z4802 Encounter for removal of sutures: Secondary | ICD-10-CM

## 2014-03-21 DIAGNOSIS — Z951 Presence of aortocoronary bypass graft: Secondary | ICD-10-CM

## 2014-03-21 DIAGNOSIS — I10 Essential (primary) hypertension: Secondary | ICD-10-CM | POA: Diagnosis not present

## 2014-03-21 MED ORDER — AMIODARONE HCL 200 MG PO TABS: 100.0000 mg | ORAL_TABLET | Freq: Every day | ORAL | Status: DC

## 2014-03-21 NOTE — Addendum Note (Signed)
Addendum  created 03/21/14 0711 by Laverle HobbyGregory Aunica Dauphinee, MD   Modules edited: Anesthesia Events

## 2014-03-21 NOTE — Progress Notes (Signed)
Mr. Gregory Davila returns for remaining staple removal from his sternal incision. At his last visit every other one was removed.  The incision continues to be very well healed.  We discussed the numbness he is experiencing and I reassured him that this was normal, but unpredictable regarding duration.  He will return as scheduled with a chest xray.

## 2014-03-21 NOTE — Patient Instructions (Addendum)
Your physician has recommended you make the following change in your medication:  1. DECREASE Amiodarone to 100mg  daily  Your physician recommends that you schedule a follow-up appointment in: 6 WEEKS with Dr Excell Seltzerooper

## 2014-03-21 NOTE — Progress Notes (Signed)
Cardiology Office Note   Date:  03/21/2014   ID:  Gregory Davila, DOB 1940-02-08, MRN [id number]  PCP:  [name], MD  Cardiologist:  Tonny Bollman, MD    No chief complaint on file.    History of Present Illness: Gregory Davila is a 74 y.o. male who presents for hospital follow-up evaluation. He has been followed long-term for coronary artery disease. The patient has had chronic total occlusion of the LAD. He presented last month with a non-ST elevation infarction and was found to have critical stenosis of the left circumflex. Considering his multivessel disease with total occlusion of the LAD, he underwent CABG. He has also been anticoagulated long-term with warfarin. Postoperatively he developed rapid atrial fibrillation requiring an increase in his amiodarone dose. He also developed C. difficile colitis. He required reoperation on his sternum.  He feels like he is finally starting to get some of his strength back. He's had no chest pain or tachycardia palpitations. He denies orthopnea or PND. He denies lightheadedness or syncope.   Past Medical History  Diagnosis Date  . Paroxysmal atrial fibrillation     a. On Coumadin therapy/amidarone. b. 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  [name] CAD (coronary artery disease)     a, 2011: occluded LAD with left to right collaterals and moderate disease in the right coronary artery and circumflex, EF 50%. b. NSTEMI 07/2011 in setting of AF-RVR, MV abnl, cath w/ LAD 100%, RCA mod-severe dz, small vessel, med rx  . Hypertension     long-standing  . Chronic renal insufficiency     followed by primary care physician  . Pulmonary embolism   . Gout   . Anginal pain   . Myocardial infarction     Past Surgical History  Procedure Laterality Date  . Cardiac catheterization  2011 & 2013    [place], LAD 100%, CFX 50%, RCA 80%, small vessel, med rx  . Nm myoview ltd  2013    Scar in the  anteroseptal/periapical distribution with moderate peri-infarct ischemia, EF 43%  . Left heart catheterization with coronary angiogram N/A 02/08/2014    Procedure: LEFT HEART CATHETERIZATION WITH CORONARY ANGIOGRAM;  Surgeon: Micheline Chapman, MD;  Location: [facility] CATH LAB;  Service: Cardiovascular;  Laterality: N/A;  . Coronary artery bypass graft N/A 02/14/2014    Procedure: CORONARY ARTERY BYPASS GRAFTING (CABG)X4 LIMA-LAD; SVG-RCA(ENDARDERECTOMY); SVG-OM; SVG-DIAG;  Surgeon: Kerin Perna, MD;  Location: [facility] OR;  Service: Open Heart Surgery;  Laterality: N/A;  . Tee without cardioversion N/A 02/14/2014    Procedure: TRANSESOPHAGEAL ECHOCARDIOGRAM (TEE);  Surgeon: Kerin Perna, MD;  Location: [facility] OR;  Service: Open Heart Surgery;  Laterality: N/A;  . Total knee arthroplasty Bilateral   . Appendectomy    . Sternal incision reclosure N/A 02/28/2014    Procedure: Irrigation and Debridement of Sternum with Sternal Rewiring;  Surgeon: Kerin Perna, MD;  Location: [facility] OR;  Service: Thoracic;  Laterality: N/A;    Current Outpatient Prescriptions  Medication Sig Dispense Refill  . allopurinol (ZYLOPRIM) 300 MG tablet Take 0.5 tablets (150 mg total) by mouth daily. 30 tablet 1  . amiodarone (PACERONE) 200 MG tablet Take 1 tablet (200 mg total) by mouth 2 (two) times daily. 60 tablet 1  . atorvastatin (LIPITOR) 40 MG tablet Take 1 tablet (40 mg total) by mouth daily at 6 PM. 30 tablet 1  . carvedilol (COREG) 3.125 MG tablet Take 1 tablet (3.125  mg total) by mouth 2 (two) times daily with a meal. 60 tablet 1  . COLCRYS 0.6 MG tablet Take 1 tablet by mouth 2 (two) times daily as needed. For gout    . loratadine (CLARITIN) 10 MG tablet Take 10 mg by mouth daily.    . metroNIDAZOLE (FLAGYL) 500 MG tablet Take 1 tablet (500 mg total) by mouth every 8 (eight) hours. X 5 days 15 tablet 0  . multivitamin (THERAGRAN) per tablet Take 1 tablet by mouth daily.      . nitroGLYCERIN (NITROSTAT) 0.4 MG SL tablet Place  1 tablet (0.4 mg total) under the tongue every 5 (five) minutes x 3 doses as needed for chest pain. 25 tablet 2  . oxyCODONE (OXY IR/ROXICODONE) 5 MG immediate release tablet Take 1-2 tablets (5-10 mg total) by mouth every 3 (three) hours as needed for severe pain. 30 tablet 0  . saccharomyces boulardii (FLORASTOR) 250 MG capsule Take 1 capsule (250 mg total) by mouth 2 (two) times daily. 60 capsule 0  . warfarin (COUMADIN) 5 MG tablet Take as directed by the coumadin clinic     No current facility-administered medications for this visit.    Allergies:   Review of patient's allergies indicates no known allergies.   Social History:  The patient  reports that he quit smoking about 48 years ago. His smoking use included Cigarettes. He quit smokeless tobacco use about 7 years ago. His smokeless tobacco use included Chew. He reports that he drinks alcohol. He reports that he does not use illicit drugs.   Family History:  The patient's  family history includes Coronary artery disease in his brother.    ROS:  Please see the history of present illness.  Otherwise, review of systems is positive for generalized weakness.  All other systems are reviewed and negative.    PHYSICAL EXAM: VS:  BP 124/76 mmHg  Pulse 88  Ht 5\' 11"  (1.803 m)  Wt 207 lb 1.9 oz (93.949 kg)  BMI 28.90 kg/m2 , BMI Body mass index is 28.9 kg/(m^2). GEN: Well nourished, well developed, in no acute distress HEENT: normal Neck: no JVD, no masses. No carotid bruits Cardiac: RRR without murmur or gallop                Respiratory:  clear to auscultation bilaterally, normal work of breathing GI: soft, nontender, nondistended, + BS MS: no deformity or atrophy Ext: no pretibial edema Skin: warm and dry, no rash Neuro:  Strength and sensation are intact Psych: euthymic mood, full affect  EKG:  EKG is not ordered today.  Recent Labs: 02/04/2014: TSH 2.884 02/05/2014: ALT 59* 02/07/2014: B Natriuretic Peptide 72.3 03/02/2014:  Hemoglobin 9.3*; Platelets 441* 03/03/2014: BUN 10; Creatinine 1.48*; Magnesium 1.7; Potassium 3.7; Sodium 135   Lipid Panel     Component Value Date/Time   CHOL 111 02/13/2014 0230   TRIG 92 02/13/2014 0230   HDL 34* 02/13/2014 0230   CHOLHDL 3.3 02/13/2014 0230   VLDL 18 02/13/2014 0230   LDLCALC 59 02/13/2014 0230      Wt Readings from Last 3 Encounters:  03/21/14 207 lb 1.9 oz (93.949 kg)  03/13/14 220 lb (99.791 kg)  03/05/14 220 lb 8 oz (100.018 kg)     Cardiac Studies Reviewed: 2-D echocardiographic Feb 16th 2016: Study Conclusions - Left ventricle: The cavity size was normal. Wall thickness was increased in a pattern of moderate LVH. Systolic function was mildly to moderately reduced. The estimated ejection fraction was  in the range of 40% to 45%. Septal hypokinesis. Doppler parameters are consistent with abnormal left ventricular relaxation (grade 1 diastolic dysfunction). - Aortic valve: There was no stenosis. There was mild regurgitation. - Mitral valve: Mildly calcified annulus. Mildly calcified leaflets . There was mild regurgitation. - Right ventricle: The cavity size was normal. Systolic function was normal. - Pulmonary arteries: No complete TR doppler jet so unable to estimate PA systolic pressure. - Systemic veins: IVC measured 2.5 cm with no significant respirophasic variation, estimate RA pressure 15 mmHg.  Impressions:  - Normal LV size with moderate LV hypertrophy. EF 40-45% with septal hypokinesis. Normal RV size and systolic function. Mild AI, mild MR.  ASSESSMENT AND PLAN: 1.  CAD, native vessel: The patient is progressing well after multivessel CABG. He has no exertional angina. He understands that he will have a slow recovery after his prolonged hospitalization. Medications were reviewed and he will stop clopidogrel. He will remain on low-dose aspirin and warfarin.  2. Paroxysmal atrial fibrillation. He is maintaining  sinus rhythm. I advised that he reduce amiodarone back to his baseline dose of 100 mg daily.  3. Hypertension: Controlled on carvedilol alone.  4. Hyperlipidemia: Treated with atorvastatin 40 mg daily.  Current medicines are reviewed with the patient today.  The patient does not have concerns regarding medicines.  The following changes have been made:  See above  Labs/ tests ordered today include:  No orders of the defined types were placed in this encounter.   Disposition:   FU 6 weeks  Signed, Tonny Bollman, MD  03/21/2014 3:29 PM    [facility] [address] [place], [place], [place]  [address] Phone: [phone]; Fax: [phone]

## 2014-03-22 ENCOUNTER — Encounter: Payer: Self-pay | Admitting: Cardiovascular Disease

## 2014-03-25 ENCOUNTER — Ambulatory Visit (INDEPENDENT_AMBULATORY_CARE_PROVIDER_SITE_OTHER): Payer: Medicare Other | Admitting: *Deleted

## 2014-03-25 ENCOUNTER — Encounter (HOSPITAL_COMMUNITY)
Admission: RE | Admit: 2014-03-25 | Discharge: 2014-03-25 | Disposition: A | Discharge status: Home / Self Care | Payer: Medicare Other | Source: Ambulatory Visit | Attending: Cardiovascular Disease | Admitting: Cardiovascular Disease

## 2014-03-25 DIAGNOSIS — I2510 Atherosclerotic heart disease of native coronary artery without angina pectoris: Secondary | ICD-10-CM | POA: Diagnosis not present

## 2014-03-25 DIAGNOSIS — Z951 Presence of aortocoronary bypass graft: Secondary | ICD-10-CM | POA: Diagnosis not present

## 2014-03-25 DIAGNOSIS — Z5181 Encounter for therapeutic drug level monitoring: Secondary | ICD-10-CM

## 2014-03-25 DIAGNOSIS — I252 Old myocardial infarction: Secondary | ICD-10-CM | POA: Diagnosis not present

## 2014-03-25 LAB — POCT INR: INR: 1.9

## 2014-03-27 ENCOUNTER — Encounter (HOSPITAL_COMMUNITY)
Admission: RE | Admit: 2014-03-27 | Discharge: 2014-03-27 | Disposition: A | Discharge status: Home / Self Care | Payer: Medicare Other | Source: Ambulatory Visit | Attending: Cardiovascular Disease | Admitting: Cardiovascular Disease

## 2014-03-27 DIAGNOSIS — I2510 Atherosclerotic heart disease of native coronary artery without angina pectoris: Secondary | ICD-10-CM | POA: Diagnosis not present

## 2014-03-28 ENCOUNTER — Telehealth: Payer: Self-pay | Admitting: *Deleted

## 2014-03-28 DIAGNOSIS — G8918 Other acute postprocedural pain: Secondary | ICD-10-CM

## 2014-03-28 MED ORDER — TRAMADOL HCL 50 MG PO TABS: 50.0000 mg | ORAL_TABLET | Freq: Four times a day (QID) | ORAL | Status: DC | PRN

## 2014-03-28 NOTE — Telephone Encounter (Signed)
Mr. Gregory Davila is requesting a refill for Oxycodone and I said he would have to come to the office to pick up a new script.  He said he only uses at bedtime for sleep and comfort..  I suggested using other meds.  He doesn't like to use Tylenol and he is on blood thinners so he cannot use NSAIDS.  I suggested Tramadol and he said he remembered using it in the hospital and that would be fine. I said I would fax a script to his pharmacy.

## 2014-03-29 ENCOUNTER — Encounter (HOSPITAL_COMMUNITY)
Admission: RE | Admit: 2014-03-29 | Discharge: 2014-03-29 | Disposition: A | Discharge status: Home / Self Care | Payer: Medicare Other | Source: Ambulatory Visit | Attending: Cardiovascular Disease | Admitting: Cardiovascular Disease

## 2014-03-29 DIAGNOSIS — I2510 Atherosclerotic heart disease of native coronary artery without angina pectoris: Secondary | ICD-10-CM | POA: Diagnosis not present

## 2014-04-01 ENCOUNTER — Encounter (HOSPITAL_COMMUNITY)
Admission: RE | Admit: 2014-04-01 | Discharge: 2014-04-01 | Disposition: A | Discharge status: Home / Self Care | Payer: Medicare Other | Source: Ambulatory Visit | Attending: Cardiovascular Disease | Admitting: Cardiovascular Disease

## 2014-04-01 ENCOUNTER — Ambulatory Visit (INDEPENDENT_AMBULATORY_CARE_PROVIDER_SITE_OTHER): Payer: Medicare Other | Admitting: *Deleted

## 2014-04-01 DIAGNOSIS — I480 Paroxysmal atrial fibrillation: Secondary | ICD-10-CM | POA: Diagnosis not present

## 2014-04-01 DIAGNOSIS — Z5181 Encounter for therapeutic drug level monitoring: Secondary | ICD-10-CM

## 2014-04-01 DIAGNOSIS — I2510 Atherosclerotic heart disease of native coronary artery without angina pectoris: Secondary | ICD-10-CM | POA: Diagnosis not present

## 2014-04-01 LAB — POCT INR: INR: 1.8

## 2014-04-03 ENCOUNTER — Encounter (HOSPITAL_COMMUNITY)
Admission: RE | Admit: 2014-04-03 | Discharge: 2014-04-03 | Disposition: A | Discharge status: Home / Self Care | Payer: Medicare Other | Source: Ambulatory Visit | Attending: Cardiovascular Disease | Admitting: Cardiovascular Disease

## 2014-04-03 DIAGNOSIS — I2510 Atherosclerotic heart disease of native coronary artery without angina pectoris: Secondary | ICD-10-CM | POA: Diagnosis not present

## 2014-04-05 ENCOUNTER — Encounter (HOSPITAL_COMMUNITY)
Admission: RE | Admit: 2014-04-05 | Discharge: 2014-04-05 | Disposition: A | Discharge status: Home / Self Care | Payer: Medicare Other | Source: Ambulatory Visit | Attending: Cardiovascular Disease | Admitting: Cardiovascular Disease

## 2014-04-05 DIAGNOSIS — Z951 Presence of aortocoronary bypass graft: Secondary | ICD-10-CM | POA: Diagnosis not present

## 2014-04-05 DIAGNOSIS — I252 Old myocardial infarction: Secondary | ICD-10-CM | POA: Insufficient documentation

## 2014-04-05 DIAGNOSIS — I2510 Atherosclerotic heart disease of native coronary artery without angina pectoris: Secondary | ICD-10-CM | POA: Diagnosis not present

## 2014-04-08 ENCOUNTER — Encounter (HOSPITAL_COMMUNITY)
Admission: RE | Admit: 2014-04-08 | Discharge: 2014-04-08 | Disposition: A | Discharge status: Home / Self Care | Payer: Medicare Other | Source: Ambulatory Visit | Attending: Cardiovascular Disease | Admitting: Cardiovascular Disease

## 2014-04-08 ENCOUNTER — Ambulatory Visit (INDEPENDENT_AMBULATORY_CARE_PROVIDER_SITE_OTHER): Payer: Medicare Other | Admitting: *Deleted

## 2014-04-08 DIAGNOSIS — I2510 Atherosclerotic heart disease of native coronary artery without angina pectoris: Secondary | ICD-10-CM | POA: Diagnosis not present

## 2014-04-08 DIAGNOSIS — Z5181 Encounter for therapeutic drug level monitoring: Secondary | ICD-10-CM | POA: Diagnosis not present

## 2014-04-08 DIAGNOSIS — I480 Paroxysmal atrial fibrillation: Secondary | ICD-10-CM

## 2014-04-08 LAB — POCT INR: INR: 2.9

## 2014-04-10 ENCOUNTER — Encounter (HOSPITAL_COMMUNITY)
Admission: RE | Admit: 2014-04-10 | Discharge: 2014-04-10 | Disposition: A | Discharge status: Home / Self Care | Payer: Medicare Other | Source: Ambulatory Visit | Attending: Cardiovascular Disease | Admitting: Cardiovascular Disease

## 2014-04-10 DIAGNOSIS — I2510 Atherosclerotic heart disease of native coronary artery without angina pectoris: Secondary | ICD-10-CM | POA: Diagnosis not present

## 2014-04-12 ENCOUNTER — Encounter (HOSPITAL_COMMUNITY)
Admission: RE | Admit: 2014-04-12 | Discharge: 2014-04-12 | Disposition: A | Discharge status: Home / Self Care | Payer: Medicare Other | Source: Ambulatory Visit | Attending: Cardiovascular Disease | Admitting: Cardiovascular Disease

## 2014-04-12 DIAGNOSIS — I2510 Atherosclerotic heart disease of native coronary artery without angina pectoris: Secondary | ICD-10-CM | POA: Diagnosis not present

## 2014-04-15 ENCOUNTER — Ambulatory Visit (INDEPENDENT_AMBULATORY_CARE_PROVIDER_SITE_OTHER): Payer: Medicare Other | Admitting: *Deleted

## 2014-04-15 ENCOUNTER — Encounter (HOSPITAL_COMMUNITY)
Admission: RE | Admit: 2014-04-15 | Discharge: 2014-04-15 | Disposition: A | Discharge status: Home / Self Care | Payer: Medicare Other | Source: Ambulatory Visit | Attending: Cardiovascular Disease | Admitting: Cardiovascular Disease

## 2014-04-15 DIAGNOSIS — I480 Paroxysmal atrial fibrillation: Secondary | ICD-10-CM | POA: Diagnosis not present

## 2014-04-15 DIAGNOSIS — Z5181 Encounter for therapeutic drug level monitoring: Secondary | ICD-10-CM | POA: Diagnosis not present

## 2014-04-15 DIAGNOSIS — I2510 Atherosclerotic heart disease of native coronary artery without angina pectoris: Secondary | ICD-10-CM | POA: Diagnosis not present

## 2014-04-15 LAB — POCT INR: INR: 3.5

## 2014-04-16 ENCOUNTER — Other Ambulatory Visit: Payer: Self-pay | Admitting: Cardiothoracic Surgery

## 2014-04-16 DIAGNOSIS — Z951 Presence of aortocoronary bypass graft: Secondary | ICD-10-CM

## 2014-04-17 ENCOUNTER — Encounter (HOSPITAL_COMMUNITY): Payer: Medicare Other

## 2014-04-17 ENCOUNTER — Ambulatory Visit: Payer: Self-pay | Admitting: Cardiothoracic Surgery

## 2014-04-19 ENCOUNTER — Encounter (HOSPITAL_COMMUNITY)
Admission: RE | Admit: 2014-04-19 | Discharge: 2014-04-19 | Disposition: A | Discharge status: Home / Self Care | Payer: Medicare Other | Source: Ambulatory Visit | Attending: Cardiovascular Disease | Admitting: Cardiovascular Disease

## 2014-04-19 DIAGNOSIS — I2510 Atherosclerotic heart disease of native coronary artery without angina pectoris: Secondary | ICD-10-CM | POA: Diagnosis not present

## 2014-04-22 ENCOUNTER — Encounter (HOSPITAL_COMMUNITY)
Admission: RE | Admit: 2014-04-22 | Discharge: 2014-04-22 | Disposition: A | Discharge status: Home / Self Care | Payer: Medicare Other | Source: Ambulatory Visit | Attending: Cardiovascular Disease | Admitting: Cardiovascular Disease

## 2014-04-22 DIAGNOSIS — I2510 Atherosclerotic heart disease of native coronary artery without angina pectoris: Secondary | ICD-10-CM | POA: Diagnosis not present

## 2014-04-24 ENCOUNTER — Encounter (HOSPITAL_COMMUNITY)
Admission: RE | Admit: 2014-04-24 | Discharge: 2014-04-24 | Disposition: A | Discharge status: Home / Self Care | Payer: Medicare Other | Source: Ambulatory Visit | Attending: Cardiovascular Disease | Admitting: Cardiovascular Disease

## 2014-04-24 DIAGNOSIS — I2510 Atherosclerotic heart disease of native coronary artery without angina pectoris: Secondary | ICD-10-CM | POA: Diagnosis not present

## 2014-04-25 ENCOUNTER — Ambulatory Visit (INDEPENDENT_AMBULATORY_CARE_PROVIDER_SITE_OTHER): Payer: Self-pay | Admitting: Cardiothoracic Surgery

## 2014-04-25 ENCOUNTER — Ambulatory Visit
Admission: RE | Admit: 2014-04-25 | Discharge: 2014-04-25 | Disposition: A | Discharge status: Home / Self Care | Payer: Medicare Other | Source: Ambulatory Visit | Attending: Cardiothoracic Surgery | Admitting: Cardiothoracic Surgery

## 2014-04-25 ENCOUNTER — Encounter: Payer: Self-pay | Admitting: Cardiothoracic Surgery

## 2014-04-25 ENCOUNTER — Other Ambulatory Visit: Payer: Self-pay | Admitting: *Deleted

## 2014-04-25 VITALS — BP 139/79 | HR 78 | Resp 16 | Ht 71.0 in | Wt 201.0 lb

## 2014-04-25 DIAGNOSIS — Z951 Presence of aortocoronary bypass graft: Secondary | ICD-10-CM

## 2014-04-25 DIAGNOSIS — G8918 Other acute postprocedural pain: Secondary | ICD-10-CM

## 2014-04-25 DIAGNOSIS — I2510 Atherosclerotic heart disease of native coronary artery without angina pectoris: Secondary | ICD-10-CM

## 2014-04-25 MED ORDER — OXYCODONE HCL 5 MG PO TABS: 5.0000 mg | ORAL_TABLET | Freq: Four times a day (QID) | ORAL | Status: DC | PRN

## 2014-04-25 NOTE — Progress Notes (Signed)
PCP is [name], MD Referring Provider is Tonny Bollman, MD  Chief Complaint  Patient presents with  . Routine Post Op    6 wk f/u with a cxr s/p CABG X 4.... 02/14/14    HPI:2 months postop followup after urgent CABG x4. Operation was followed by C. Difficile colitis, abdominal distention, and sternal wire disruption requiring sternal rewiring. Patient had atrial fibrillation and is on Coumadin. He is recovered well from his wife the hospitalization. He is advancing in cardiac rehabilitation and can walk 1.5 miles. He has had no recurrent angina. The sternal incision is healing well. He remains on Coumadin without any bleeding complications. His sinus rhythm and has been maintained with oral amiodarone.   Past Medical History  Diagnosis Date  . Paroxysmal atrial fibrillation     a. On Coumadin therapy/amidarone. b. 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  [name] CAD (coronary artery disease)     a, 2011: occluded LAD with left to right collaterals and moderate disease in the right coronary artery and circumflex, EF 50%. b. NSTEMI 07/2011 in setting of AF-RVR, MV abnl, cath w/ LAD 100%, RCA mod-severe dz, small vessel, med rx  . Hypertension     long-standing  . Chronic renal insufficiency     followed by primary care physician  . Pulmonary embolism   . Gout   . Anginal pain   . Myocardial infarction     Past Surgical History  Procedure Laterality Date  . Cardiac catheterization  2011 & 2013    2013 L [place], LAD 100%, CFX 50%, RCA 80%, small vessel, med rx  . Nm myoview ltd  2013    Scar in the anteroseptal/periapical distribution with moderate peri-infarct ischemia, EF 43%  . Left heart catheterization with coronary angiogram N/A 02/08/2014    Procedure: LEFT HEART CATHETERIZATION WITH CORONARY ANGIOGRAM;  Surgeon: Micheline Chapman, MD;  Location: [facility] CATH LAB;  Service: Cardiovascular;  Laterality: N/A;  . Coronary artery bypass graft N/A 02/14/2014     Procedure: CORONARY ARTERY BYPASS GRAFTING (CABG)X4 LIMA-LAD; SVG-RCA(ENDARDERECTOMY); SVG-OM; SVG-DIAG;  Surgeon: Kerin Perna, MD;  Location: [facility] OR;  Service: Open Heart Surgery;  Laterality: N/A;  . Tee without cardioversion N/A 02/14/2014    Procedure: TRANSESOPHAGEAL ECHOCARDIOGRAM (TEE);  Surgeon: Kerin Perna, MD;  Location: [facility] OR;  Service: Open Heart Surgery;  Laterality: N/A;  . Total knee arthroplasty Bilateral   . Appendectomy    . Sternal incision reclosure N/A 02/28/2014    Procedure: Irrigation and Debridement of Sternum with Sternal Rewiring;  Surgeon: Kerin Perna, MD;  Location: [facility] OR;  Service: Thoracic;  Laterality: N/A;    Family History  Problem Relation Age of Onset  . Coronary artery disease Brother     MI    Social History History  Substance Use Topics  . Smoking status: Former Smoker    Types: Cigarettes    Quit date: 01/04/1966  . Smokeless tobacco: Former Neurosurgeon    Types: Chew    Quit date: 01/05/2007     Comment: quit chewing tobacco about 4 yrs ago  . Alcohol Use: Yes     Comment: "seldomly drink beer" 1 every 2-3 weeks    Current Outpatient Prescriptions  Medication Sig Dispense Refill  . allopurinol (ZYLOPRIM) 300 MG tablet Take 0.5 tablets (150 mg total) by mouth daily. 30 tablet 1  . amiodarone (PACERONE) 200 MG tablet Take 0.5 tablets (100 mg total) by mouth daily. 60  tablet 1  . atorvastatin (LIPITOR) 40 MG tablet Take 1 tablet (40 mg total) by mouth daily at 6 PM. 30 tablet 1  . carvedilol (COREG) 3.125 MG tablet Take 1 tablet (3.125 mg total) by mouth 2 (two) times daily with a meal. 60 tablet 1  . COLCRYS 0.6 MG tablet Take 1 tablet by mouth 2 (two) times daily as needed. For gout    . loratadine (CLARITIN) 10 MG tablet Take 10 mg by mouth daily.    . multivitamin (THERAGRAN) per tablet Take 1 tablet by mouth daily.      . nitroGLYCERIN (NITROSTAT) 0.4 MG SL tablet Place 1 tablet (0.4 mg total) under the tongue every 5 (five)  minutes x 3 doses as needed for chest pain. 25 tablet 2  . oxyCODONE (OXY IR/ROXICODONE) 5 MG immediate release tablet Take 1-2 tablets (5-10 mg total) by mouth every 6 (six) hours as needed for severe pain. 30 tablet 0  . saccharomyces boulardii (FLORASTOR) 250 MG capsule Take 1 capsule (250 mg total) by mouth 2 (two) times daily. 60 capsule 0  . traMADol (ULTRAM) 50 MG tablet Take 1 tablet (50 mg total) by mouth every 6 (six) hours as needed. 30 tablet 0  . warfarin (COUMADIN) 5 MG tablet Take as directed by the coumadin clinic     No current facility-administered medications for this visit.    No Known Allergies  Review of Systems  Improved exercise tolerance and appetite and overall strength Surgical incisions are all healing well No fluid retention  BP 139/79 mmHg  Pulse 78  Resp 16  Ht 5\' 11"  (1.803 m)  Wt 201 lb (91.173 kg)  BMI 28.05 kg/m2  SpO2 96% Physical Exam The patient is alert and comfortable Lungs are clear Heart rhythm regular without gallop Sternal incision stable well-healed No pedal edema  Diagnostic Tests: Chest x-ray is clear with sternal wires intact  Impression: Excellent recovery after prolonged postop hospitalization Patient may drive Patient made resume working and or strenuous activities after June 1. Return to work  Note provided patient to de- liver pharmacy drugs starting may 1.  Plan: Return as needed  Mikey BussingPeter Van Trigt III, MD [facility] [phone]

## 2014-04-26 ENCOUNTER — Encounter (HOSPITAL_COMMUNITY)
Admission: RE | Admit: 2014-04-26 | Discharge: 2014-04-26 | Disposition: A | Discharge status: Home / Self Care | Payer: Medicare Other | Source: Ambulatory Visit | Attending: Cardiovascular Disease | Admitting: Cardiovascular Disease

## 2014-04-26 DIAGNOSIS — I2510 Atherosclerotic heart disease of native coronary artery without angina pectoris: Secondary | ICD-10-CM | POA: Diagnosis not present

## 2014-04-29 ENCOUNTER — Encounter (HOSPITAL_COMMUNITY)
Admission: RE | Admit: 2014-04-29 | Discharge: 2014-04-29 | Disposition: A | Discharge status: Home / Self Care | Payer: Medicare Other | Source: Ambulatory Visit | Attending: Cardiovascular Disease | Admitting: Cardiovascular Disease

## 2014-04-29 ENCOUNTER — Ambulatory Visit (INDEPENDENT_AMBULATORY_CARE_PROVIDER_SITE_OTHER): Payer: Medicare Other | Admitting: *Deleted

## 2014-04-29 DIAGNOSIS — I480 Paroxysmal atrial fibrillation: Secondary | ICD-10-CM

## 2014-04-29 DIAGNOSIS — I2510 Atherosclerotic heart disease of native coronary artery without angina pectoris: Secondary | ICD-10-CM | POA: Diagnosis not present

## 2014-04-29 DIAGNOSIS — Z5181 Encounter for therapeutic drug level monitoring: Secondary | ICD-10-CM

## 2014-04-29 LAB — POCT INR: INR: 3.3

## 2014-05-01 ENCOUNTER — Encounter (HOSPITAL_COMMUNITY)
Admission: RE | Admit: 2014-05-01 | Discharge: 2014-05-01 | Disposition: A | Discharge status: Home / Self Care | Payer: Medicare Other | Source: Ambulatory Visit | Attending: Cardiovascular Disease | Admitting: Cardiovascular Disease

## 2014-05-01 DIAGNOSIS — I2510 Atherosclerotic heart disease of native coronary artery without angina pectoris: Secondary | ICD-10-CM | POA: Diagnosis not present

## 2014-05-02 ENCOUNTER — Other Ambulatory Visit: Payer: Self-pay | Admitting: Physician Assistant

## 2014-05-03 ENCOUNTER — Encounter (HOSPITAL_COMMUNITY)
Admission: RE | Admit: 2014-05-03 | Discharge: 2014-05-03 | Disposition: A | Discharge status: Home / Self Care | Payer: Medicare Other | Source: Ambulatory Visit | Attending: Cardiovascular Disease | Admitting: Cardiovascular Disease

## 2014-05-03 ENCOUNTER — Other Ambulatory Visit: Payer: Self-pay | Admitting: Physician Assistant

## 2014-05-03 DIAGNOSIS — I2510 Atherosclerotic heart disease of native coronary artery without angina pectoris: Secondary | ICD-10-CM | POA: Diagnosis not present

## 2014-05-06 ENCOUNTER — Encounter (HOSPITAL_COMMUNITY)
Admission: RE | Admit: 2014-05-06 | Discharge: 2014-05-06 | Disposition: A | Discharge status: Home / Self Care | Payer: Medicare Other | Source: Ambulatory Visit | Attending: Cardiovascular Disease | Admitting: Cardiovascular Disease

## 2014-05-06 ENCOUNTER — Encounter: Payer: Self-pay | Admitting: Cardiovascular Disease

## 2014-05-06 ENCOUNTER — Ambulatory Visit (INDEPENDENT_AMBULATORY_CARE_PROVIDER_SITE_OTHER): Payer: Medicare Other | Admitting: Cardiovascular Disease

## 2014-05-06 ENCOUNTER — Other Ambulatory Visit: Payer: Self-pay | Admitting: Cardiovascular Disease

## 2014-05-06 VITALS — BP 140/82 | HR 78 | Ht 71.0 in | Wt 206.0 lb

## 2014-05-06 DIAGNOSIS — I2510 Atherosclerotic heart disease of native coronary artery without angina pectoris: Secondary | ICD-10-CM | POA: Insufficient documentation

## 2014-05-06 DIAGNOSIS — I259 Chronic ischemic heart disease, unspecified: Secondary | ICD-10-CM

## 2014-05-06 DIAGNOSIS — Z951 Presence of aortocoronary bypass graft: Secondary | ICD-10-CM | POA: Diagnosis not present

## 2014-05-06 DIAGNOSIS — I252 Old myocardial infarction: Secondary | ICD-10-CM | POA: Diagnosis not present

## 2014-05-06 NOTE — Patient Instructions (Signed)
Medication Instructions:  Your physician recommends that you continue on your current medications as directed. Please refer to the Current Medication list given to you today.  Labwork: No new orders.   Testing/Procedures: No new orders.   Follow-Up: Your physician wants you to follow-up in: 6 MONTHS with Dr Cooper.  You will receive a reminder letter in the mail two months in advance. If you don't receive a letter, please call our office to schedule the follow-up appointment.   Any Other Special Instructions Will Be Listed Below (If Applicable).   

## 2014-05-06 NOTE — Progress Notes (Signed)
Cardiology Office Note   Date:  05/06/2014   ID:  Gregory Davila, DOB [birth year], MRN [id number]  PCP:  [name], MD  Cardiologist:  Tonny Bollman, MD    No chief complaint on file.    History of Present Illness: Gregory Davila is a 74 y.o. male who presents for follow-up of coronary artery disease. The patient has had chronic total occlusion of the LAD. He presented last month with a non-ST elevation infarction and was found to have critical stenosis of the left circumflex. Considering his multivessel disease with total occlusion of the LAD, he underwent CABG. He has also been anticoagulated long-term with warfarin. Postoperatively he developed rapid atrial fibrillation requiring an increase in his amiodarone dose. He also developed C. difficile colitis. He required reoperation on his sternum. He was initially seen for postoperative follow-up 03/21/2014. Amiodarone was reduced back to 100 mg daily at that time. No other medicine changes were made.  Since his last visit, he continues to do quite well. He denies exertional chest pain or pressure. He is walking up to 1.5 miles per day. He denies shortness of breath or tachycardia palpitations. He denies leg swelling.   Past Medical History  Diagnosis Date  . Paroxysmal atrial fibrillation     a. On Coumadin therapy/amidarone. b. 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  [name] CAD (coronary artery disease)     a, 2011: occluded LAD with left to right collaterals and moderate disease in the right coronary artery and circumflex, EF 50%. b. NSTEMI 07/2011 in setting of AF-RVR, MV abnl, cath w/ LAD 100%, RCA mod-severe dz, small vessel, med rx  . Hypertension     long-standing  . Chronic renal insufficiency     followed by primary care physician  . Pulmonary embolism   . Gout   . Anginal pain   . Myocardial infarction     Past Surgical History  Procedure Laterality Date  . Cardiac catheterization  2011 &  2013    2013 [place], LAD 100%, CFX 50%, RCA 80%, small vessel, med rx  . Nm myoview ltd  2013    Scar in the anteroseptal/periapical distribution with moderate peri-infarct ischemia, EF 43%  . Left heart catheterization with coronary angiogram N/A 02/08/2014    Procedure: LEFT HEART CATHETERIZATION WITH CORONARY ANGIOGRAM;  Surgeon: Micheline Chapman, MD;  Location: [facility] CATH LAB;  Service: Cardiovascular;  Laterality: N/A;  . Coronary artery bypass graft N/A 02/14/2014    Procedure: CORONARY ARTERY BYPASS GRAFTING (CABG)X4 LIMA-LAD; SVG-RCA(ENDARDERECTOMY); SVG-OM; SVG-DIAG;  Surgeon: Kerin Perna, MD;  Location: [facility] OR;  Service: Open Heart Surgery;  Laterality: N/A;  . Tee without cardioversion N/A 02/14/2014    Procedure: TRANSESOPHAGEAL ECHOCARDIOGRAM (TEE);  Surgeon: Kerin Perna, MD;  Location: [facility] OR;  Service: Open Heart Surgery;  Laterality: N/A;  . Total knee arthroplasty Bilateral   . Appendectomy    . Sternal incision reclosure N/A 02/28/2014    Procedure: Irrigation and Debridement of Sternum with Sternal Rewiring;  Surgeon: Kerin Perna, MD;  Location: [facility] OR;  Service: Thoracic;  Laterality: N/A;    Current Outpatient Prescriptions  Medication Sig Dispense Refill  . allopurinol (ZYLOPRIM) 300 MG tablet Take 0.5 tablets (150 mg total) by mouth daily. 30 tablet 1  . amiodarone (PACERONE) 200 MG tablet Take 0.5 tablets (100 mg total) by mouth daily. 60 tablet 1  . atorvastatin (LIPITOR) 40 MG tablet Take 1 tablet (40 mg  total) by mouth daily at 6 PM. 30 tablet 1  . carvedilol (COREG) 3.125 MG tablet Take 1 tablet (3.125 mg total) by mouth 2 (two) times daily with a meal. 60 tablet 1  . COLCRYS 0.6 MG tablet Take 1 tablet by mouth 2 (two) times daily as needed. For gout    . loratadine (CLARITIN) 10 MG tablet Take 10 mg by mouth daily.    . multivitamin (THERAGRAN) per tablet Take 1 tablet by mouth daily.      [name] oxyCODONE (OXY IR/ROXICODONE) 5 MG immediate release tablet Take  1-2 tablets (5-10 mg total) by mouth every 6 (six) hours as needed for severe pain. 30 tablet 0  . saccharomyces boulardii (FLORASTOR) 250 MG capsule Take 1 capsule (250 mg total) by mouth 2 (two) times daily. 60 capsule 0  . traMADol (ULTRAM) 50 MG tablet Take 1 tablet (50 mg total) by mouth every 6 (six) hours as needed. 30 tablet 0  . warfarin (COUMADIN) 5 MG tablet Take as directed by the coumadin clinic    . nitroGLYCERIN (NITROSTAT) 0.4 MG SL tablet Place 1 tablet (0.4 mg total) under the tongue every 5 (five) minutes x 3 doses as needed for chest pain. 25 tablet 2   No current facility-administered medications for this visit.    Allergies:   Review of patient's allergies indicates no known allergies.   Social History:  The patient  reports that he quit smoking about 48 years ago. His smoking use included Cigarettes. He quit smokeless tobacco use about 7 years ago. His smokeless tobacco use included Chew. He reports that he drinks alcohol. He reports that he does not use illicit drugs.   Family History:  The patient's  family history includes Cancer in his mother; Coronary artery disease in his brother; Heart attack in his mother; Prostate cancer in his father.    ROS:  Please see the history of present illness.  Otherwise, review of systems is positive for back pain.  All other systems are reviewed and negative.    PHYSICAL EXAM: VS:  BP 140/82 mmHg  Pulse 78  Ht  (1.803 m)  Wt 206 lb (93.441 kg)  BMI 28.74 kg/m2 , BMI Body mass index is 28.74 kg/(m^2). GEN: Well nourished, well developed, in no acute distress HEENT: normal Neck: no JVD, no masses. No carotid bruits Cardiac: RRR without murmur or gallop       Chest: Sternum is healing well.          Respiratory:  clear to auscultation bilaterally, normal work of breathing GI: soft, nontender, nondistended, + BS MS: no deformity or atrophy Ext: no pretibial edema, pedal pulses 2+= bilaterally Skin: warm and dry, no  rash Neuro:  Strength and sensation are intact Psych: euthymic mood, full affect  EKG:  EKG is not ordered today.  Recent Labs: 02/04/2014: TSH 2.884 02/05/2014: ALT 59* 02/07/2014: B Natriuretic Peptide 72.3 03/02/2014: Hemoglobin 9.3*; Platelets 441* 03/03/2014: BUN 10; Creatinine 1.48*; Magnesium 1.7; Potassium 3.7; Sodium 135   Lipid Panel     Component Value Date/Time   CHOL 111 02/13/2014 0230   TRIG 92 02/13/2014 0230   HDL 34* 02/13/2014 0230   CHOLHDL 3.3 02/13/2014 0230   VLDL 18 02/13/2014 0230   LDLCALC 59 02/13/2014 0230      Wt Readings from Last 3 Encounters:  05/06/14 206 lb (93.441 kg)  04/25/14 201 lb (91.173 kg)  03/21/14 207 lb 1.9 oz (93.949 kg)    ASSESSMENT AND  PLAN: 1.  CAD, native vessel: The patient continues to do well with steady increase in his physical activity level and no exertional angina. He will be treated with aspirin 81 mg daily for antiplatelet therapy. He is enjoying cardiac rehabilitation.  2. Paroxysmal atrial fibrillation: Maintaining sinus rhythm on low-dose amiodarone. Tolerating long-term warfarin without bleeding problems.  3. Essential hypertension: Blood pressure has been well controlled.  4. Hyperlipidemia: Lipids followed by primary care. The patient is treated with atorvastatin 40 mg daily.   Current medicines are reviewed with the patient today.  The patient does not have concerns regarding medicines.  Labs/ tests ordered today include:  No orders of the defined types were placed in this encounter.    Disposition:   FU 6 months  Signed, Tonny Bollmanooper, Xayvion Shirah, MD  05/06/2014 5:02 PM    [facility] [address], [place], [place]  [address] Phone: [phone](336) [phone]; Fax: [phone](336) [phone]

## 2014-05-07 ENCOUNTER — Other Ambulatory Visit: Payer: Self-pay | Admitting: *Deleted

## 2014-05-07 MED ORDER — CARVEDILOL 3.125 MG PO TABS: 3.1250 mg | ORAL_TABLET | Freq: Two times a day (BID) | ORAL | Status: DC

## 2014-05-07 NOTE — Telephone Encounter (Signed)
Printed in error, second refill to default to print.. Have informed management and will follow up with a ticket if occurs again.

## 2014-05-08 ENCOUNTER — Encounter (HOSPITAL_COMMUNITY)
Admission: RE | Admit: 2014-05-08 | Discharge: 2014-05-08 | Disposition: A | Discharge status: Home / Self Care | Payer: Medicare Other | Source: Ambulatory Visit | Attending: Cardiovascular Disease | Admitting: Cardiovascular Disease

## 2014-05-08 DIAGNOSIS — I2510 Atherosclerotic heart disease of native coronary artery without angina pectoris: Secondary | ICD-10-CM | POA: Diagnosis not present

## 2014-05-08 NOTE — Progress Notes (Signed)
Cardiac Rehabilitation Program Outcomes Report   Orientation:  03/19/14 Graduate Date:  tbd Discharge Date:  tbd # of sessions completed: 3  Cardiologist: Excell Seltzerooper Family MD:  Glenetta Hewapper Class Time:  0930  A.  Exercise Program:  Tolerates exercise @ 3.64  METS for 15 minutes and Walk Test Results:  Pre: 2.09 mets  B.  Mental Health:  Good mental attitude  C.  Education/Instruction/Skills  Accurately checks own pulse.  Rest:  84  Exercise:  105 and Knows THR for exercise  Uses Perceived Exertion Scale and/or Dyspnea Scale  D.  Nutrition/Weight Control/Body Composition:  Adherence to prescribed nutrition program: good    E.  Blood Lipids    Lab Results  Component Value Date   CHOL 111 02/13/2014   HDL 34* 02/13/2014   LDLCALC 59 02/13/2014   TRIG 92 02/13/2014   CHOLHDL 3.3 02/13/2014    F.  Lifestyle Changes:  Making positive lifestyle changes and Not smoking:  Quit 1966  G.  Symptoms noted with exercise:  Asymptomatic  Report Completed By:  Doretha Sou [place] RN   Comments:  This is patients first week progress note.

## 2014-05-08 NOTE — Progress Notes (Signed)
Cardiac Rehabilitation Program Outcomes Report   Orientation:  03/19/14 Graduate Date:  tbd Discharge Date:  tbd # of sessions completed: 18  Cardiologist: Clabe Sealooper Family MD:  Glenetta Hewapper Class Time:  0930  A.  Exercise Program:  Tolerates exercise @ 3.63 METS for 15 minutes  B.  Mental Health:  Good mental attitude  C.  Education/Instruction/Skills  Accurately checks own pulse.  Rest:  74  Exercise:  111 and Knows THR for exercise  Uses Perceived Exertion Scale and/or Dyspnea Scale  D.  Nutrition/Weight Control/Body Composition:  Adherence to prescribed nutrition program: good    E.  Blood Lipids    Lab Results  Component Value Date   CHOL 111 02/13/2014   HDL 34* 02/13/2014   LDLCALC 59 02/13/2014   TRIG 92 02/13/2014   CHOLHDL 3.3 02/13/2014    F.  Lifestyle Changes:  Making positive lifestyle changes  G.  Symptoms noted with exercise:  Asymptomatic  Report Completed By:  Doretha Sou Jamea Robicheaux RN   Comments:  This is patients halfway progress note in [facility].

## 2014-05-10 ENCOUNTER — Encounter (HOSPITAL_COMMUNITY)
Admission: RE | Admit: 2014-05-10 | Discharge: 2014-05-10 | Disposition: A | Discharge status: Home / Self Care | Payer: Medicare Other | Source: Ambulatory Visit | Attending: Cardiovascular Disease | Admitting: Cardiovascular Disease

## 2014-05-10 DIAGNOSIS — I2510 Atherosclerotic heart disease of native coronary artery without angina pectoris: Secondary | ICD-10-CM | POA: Diagnosis not present

## 2014-05-13 ENCOUNTER — Ambulatory Visit (INDEPENDENT_AMBULATORY_CARE_PROVIDER_SITE_OTHER): Payer: Medicare Other | Admitting: *Deleted

## 2014-05-13 ENCOUNTER — Encounter (HOSPITAL_COMMUNITY)
Admission: RE | Admit: 2014-05-13 | Discharge: 2014-05-13 | Disposition: A | Discharge status: Home / Self Care | Payer: Medicare Other | Source: Ambulatory Visit | Attending: Cardiovascular Disease | Admitting: Cardiovascular Disease

## 2014-05-13 DIAGNOSIS — I2510 Atherosclerotic heart disease of native coronary artery without angina pectoris: Secondary | ICD-10-CM | POA: Diagnosis not present

## 2014-05-13 DIAGNOSIS — I480 Paroxysmal atrial fibrillation: Secondary | ICD-10-CM | POA: Diagnosis not present

## 2014-05-13 DIAGNOSIS — Z5181 Encounter for therapeutic drug level monitoring: Secondary | ICD-10-CM | POA: Diagnosis not present

## 2014-05-13 LAB — POCT INR: INR: 3.2

## 2014-05-15 ENCOUNTER — Encounter (HOSPITAL_COMMUNITY)
Admission: RE | Admit: 2014-05-15 | Discharge: 2014-05-15 | Disposition: A | Discharge status: Home / Self Care | Payer: Medicare Other | Source: Ambulatory Visit | Attending: Cardiovascular Disease | Admitting: Cardiovascular Disease

## 2014-05-15 DIAGNOSIS — I2510 Atherosclerotic heart disease of native coronary artery without angina pectoris: Secondary | ICD-10-CM | POA: Diagnosis not present

## 2014-05-17 ENCOUNTER — Encounter (HOSPITAL_COMMUNITY)
Admission: RE | Admit: 2014-05-17 | Discharge: 2014-05-17 | Disposition: A | Discharge status: Home / Self Care | Payer: Medicare Other | Source: Ambulatory Visit | Attending: Cardiovascular Disease | Admitting: Cardiovascular Disease

## 2014-05-17 DIAGNOSIS — I2510 Atherosclerotic heart disease of native coronary artery without angina pectoris: Secondary | ICD-10-CM | POA: Diagnosis not present

## 2014-05-20 ENCOUNTER — Encounter (HOSPITAL_COMMUNITY)
Admission: RE | Admit: 2014-05-20 | Discharge: 2014-05-20 | Disposition: A | Discharge status: Home / Self Care | Payer: Medicare Other | Source: Ambulatory Visit | Attending: Cardiovascular Disease | Admitting: Cardiovascular Disease

## 2014-05-20 DIAGNOSIS — I2510 Atherosclerotic heart disease of native coronary artery without angina pectoris: Secondary | ICD-10-CM | POA: Diagnosis not present

## 2014-05-22 ENCOUNTER — Encounter (HOSPITAL_COMMUNITY)
Admission: RE | Admit: 2014-05-22 | Discharge: 2014-05-22 | Disposition: A | Discharge status: Home / Self Care | Payer: Medicare Other | Source: Ambulatory Visit | Attending: Cardiovascular Disease | Admitting: Cardiovascular Disease

## 2014-05-22 DIAGNOSIS — I2510 Atherosclerotic heart disease of native coronary artery without angina pectoris: Secondary | ICD-10-CM | POA: Diagnosis not present

## 2014-05-23 ENCOUNTER — Emergency Department (HOSPITAL_COMMUNITY)
Admission: EM | Admit: 2014-05-23 | Discharge: 2014-05-23 | Disposition: A | Discharge status: Home / Self Care | Payer: Medicare Other | Attending: Emergency Medicine | Admitting: Emergency Medicine

## 2014-05-23 ENCOUNTER — Encounter (HOSPITAL_COMMUNITY): Payer: Self-pay | Admitting: *Deleted

## 2014-05-23 DIAGNOSIS — Z951 Presence of aortocoronary bypass graft: Secondary | ICD-10-CM | POA: Diagnosis not present

## 2014-05-23 DIAGNOSIS — R079 Chest pain, unspecified: Secondary | ICD-10-CM | POA: Insufficient documentation

## 2014-05-23 DIAGNOSIS — Z79899 Other long term (current) drug therapy: Secondary | ICD-10-CM | POA: Diagnosis not present

## 2014-05-23 DIAGNOSIS — N189 Chronic kidney disease, unspecified: Secondary | ICD-10-CM | POA: Insufficient documentation

## 2014-05-23 DIAGNOSIS — Z86711 Personal history of pulmonary embolism: Secondary | ICD-10-CM | POA: Insufficient documentation

## 2014-05-23 DIAGNOSIS — Z87891 Personal history of nicotine dependence: Secondary | ICD-10-CM | POA: Diagnosis not present

## 2014-05-23 DIAGNOSIS — I2510 Atherosclerotic heart disease of native coronary artery without angina pectoris: Secondary | ICD-10-CM | POA: Insufficient documentation

## 2014-05-23 DIAGNOSIS — I129 Hypertensive chronic kidney disease with stage 1 through stage 4 chronic kidney disease, or unspecified chronic kidney disease: Secondary | ICD-10-CM | POA: Diagnosis not present

## 2014-05-23 DIAGNOSIS — Z7952 Long term (current) use of systemic steroids: Secondary | ICD-10-CM | POA: Insufficient documentation

## 2014-05-23 DIAGNOSIS — Z9889 Other specified postprocedural states: Secondary | ICD-10-CM | POA: Diagnosis not present

## 2014-05-23 DIAGNOSIS — Z7901 Long term (current) use of anticoagulants: Secondary | ICD-10-CM | POA: Insufficient documentation

## 2014-05-23 DIAGNOSIS — I252 Old myocardial infarction: Secondary | ICD-10-CM | POA: Diagnosis not present

## 2014-05-23 DIAGNOSIS — M109 Gout, unspecified: Secondary | ICD-10-CM | POA: Diagnosis not present

## 2014-05-23 LAB — BASIC METABOLIC PANEL
Anion gap: 8 (ref 5–15)
BUN: 24 mg/dL — ABNORMAL HIGH (ref 6–20)
CO2: 24 mmol/L (ref 22–32)
Calcium: 9.2 mg/dL (ref 8.9–10.3)
Chloride: 106 mmol/L (ref 101–111)
Creatinine, Ser: 1.68 mg/dL — ABNORMAL HIGH (ref 0.61–1.24)
GFR calc Af Amer: 45 mL/min — ABNORMAL LOW (ref >60)
GFR calc non Af Amer: 39 mL/min — ABNORMAL LOW (ref >60)
Glucose, Bld: 102 mg/dL — ABNORMAL HIGH (ref 65–99)
Potassium: 4.2 mmol/L (ref 3.5–5.1)
Sodium: 138 mmol/L (ref 135–145)

## 2014-05-23 LAB — CBC
HCT: 42.8 % (ref 39.0–52.0)
Hemoglobin: 13.7 g/dL (ref 13.0–17.0)
MCH: 27.4 pg (ref 26.0–34.0)
MCHC: 32 g/dL (ref 30.0–36.0)
MCV: 85.6 fL (ref 78.0–100.0)
Platelets: 279 10*3/uL (ref 150–400)
RBC: 5 MIL/uL (ref 4.22–5.81)
RDW: 15.3 % (ref 11.5–15.5)
WBC: 7.7 10*3/uL (ref 4.0–10.5)

## 2014-05-23 LAB — TROPONIN I
Troponin I: <0.03 ng/mL (ref <0.031)
Troponin I: <0.03 ng/mL (ref <0.031)

## 2014-05-23 NOTE — Discharge Instructions (Signed)
Your testing today including your EKG, and blood tests looking at your heart were normal.  If your symptoms recur, or become worse, or involved nausea shortness of breath fever or other new symptoms and please recheck here.

## 2014-05-23 NOTE — ED Provider Notes (Signed)
CSN: [id number]     Arrival date & time 05/23/14  1104 History  This chart was scribed for Rolland PorterMark Elbony Mcclimans, MD by Leona CarryG. Clay Sherrill, ED Scribe. The patient was seen in [facility]06/[facility]06. The patient's care was started at 11:38 AM.     Chief Complaint  Patient presents with  . Chest Pain      Patient is a 74 y.o. male presenting with chest pain. The history is provided by the patient. No language interpreter was used.  Chest Pain Associated symptoms: no abdominal pain, no cough, no diaphoresis, no dizziness, no dysphagia, no fatigue, no fever, no headache, no nausea, no shortness of breath and not vomiting     HPI Comments: Loraine MapleRalph D Treinen is a 74 y.o. male who presents to the Emergency Department complaining of sharp, left-sided CP beginning this morning. He reports that the pain began while he was driving. When he went home, he took a nitroglycerin tablet, which alleviated the pain. He estimates that the pain lasted for 5 minutes. He denies nausea or SOB. Patient reports that he began taking Lopresor QD 3 days ago.  Patient has a history of MI in February 2016. He had coronary bypass surgery in February 2016.  PCP is Dr. Margo Commonapper.  Past Medical History  Diagnosis Date  . Paroxysmal atrial fibrillation     a. On Coumadin therapy/amidarone. b. 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  [name]. CAD (coronary artery disease)     a, 2011: occluded LAD with left to right collaterals and moderate disease in the right coronary artery and circumflex, EF 50%. b. NSTEMI 07/2011 in setting of AF-RVR, MV abnl, cath w/ LAD 100%, RCA mod-severe dz, small vessel, med rx  . Hypertension     long-standing  . Chronic renal insufficiency     followed by primary care physician  . Pulmonary embolism   . Gout   . Anginal pain   . Myocardial infarction    Past Surgical History  Procedure Laterality Date  . Cardiac catheterization  2011 & 2013    2013 L [place], LAD 100%, CFX 50%, RCA 80%, small  vessel, med rx  . Nm myoview ltd  2013    Scar in the anteroseptal/periapical distribution with moderate peri-infarct ischemia, EF 43%  . Left heart catheterization with coronary angiogram N/A 02/08/2014    Procedure: LEFT HEART CATHETERIZATION WITH CORONARY ANGIOGRAM;  Surgeon: Micheline ChapmanMichael D Cooper, MD;  [facility];  Service: Cardiovascular;  Laterality: N/A;  . Coronary artery bypass graft N/A 02/14/2014    Procedure: CORONARY ARTERY BYPASS GRAFTING (CABG)X4 LIMA-LAD; SVG-RCA(ENDARDERECTOMY); SVG-OM; SVG-DIAG;  Surgeon: Kerin PernaPeter Van Trigt, MD;  Location: [facility] OR;  Service: Open Heart Surgery;  Laterality: N/A;  . Tee without cardioversion N/A 02/14/2014    Procedure: TRANSESOPHAGEAL ECHOCARDIOGRAM (TEE);  Surgeon: Kerin PernaPeter Van Trigt, MD;  [facility];  Service: Open Heart Surgery;  Laterality: N/A;  . Total knee arthroplasty Bilateral   . Appendectomy    . Sternal incision reclosure N/A 02/28/2014    Procedure: Irrigation and Debridement of Sternum with Sternal Rewiring;  Surgeon: Kerin PernaPeter Van Trigt, MD;  [facility];  Service: Thoracic;  Laterality: N/A;   Family History  Problem Relation Age of Onset  . Coronary artery disease Brother     MI  . Heart attack Mother   . Cancer Mother   . Prostate cancer Father    History  Substance Use Topics  . Smoking status: Former Smoker  Types: Cigarettes    Quit date: 01/04/1966  . Smokeless tobacco: Former Neurosurgeon    Types: Chew    Quit date: 01/05/2007     Comment: quit chewing tobacco about 4 yrs ago  . Alcohol Use: Yes     Comment: "seldomly drink beer" 1 every 2-3 weeks    Review of Systems  Constitutional: Negative for fever, chills, diaphoresis, appetite change and fatigue.  HENT: Negative for mouth sores, sore throat and trouble swallowing.   Eyes: Negative for visual disturbance.  Respiratory: Negative for cough, chest tightness, shortness of breath and wheezing.   Cardiovascular: Positive for chest pain.  Gastrointestinal:  Negative for nausea, vomiting, abdominal pain, diarrhea and abdominal distention.  Endocrine: Negative for polydipsia, polyphagia and polyuria.  Genitourinary: Negative for dysuria, frequency and hematuria.  Musculoskeletal: Negative for gait problem.  Skin: Negative for color change, pallor and rash.  Neurological: Negative for dizziness, syncope, light-headedness and headaches.  Hematological: Does not bruise/bleed easily.  Psychiatric/Behavioral: Negative for behavioral problems and confusion.      Allergies  Review of patient's allergies indicates no known allergies.  Home Medications   Prior to Admission medications   Medication Sig Start Date End Date Taking? Authorizing Provider  allopurinol (ZYLOPRIM) 300 MG tablet Take 0.5 tablets (150 mg total) by mouth daily. 03/05/14  Yes Wilmon Pali, PA-C  amiodarone (PACERONE) 200 MG tablet Take 0.5 tablets (100 mg total) by mouth daily. 03/21/14  Yes Tonny Bollman, MD  atorvastatin (LIPITOR) 40 MG tablet Take 1 tablet (40 mg total) by mouth daily at 6 PM. 03/05/14  Yes Wilmon Pali, PA-C  carvedilol (COREG) 3.125 MG tablet Take 1 tablet (3.125 mg total) by mouth 2 (two) times daily with a meal. 05/07/14  Yes Tonny Bollman, MD  COLCRYS 0.6 MG tablet Take 1 tablet by mouth 2 (two) times daily as needed (gout).  11/24/10  Yes Historical Provider, MD  fluticasone (FLONASE) 50 MCG/ACT nasal spray Place 1 spray into both nostrils daily as needed for allergies or rhinitis.   Yes Historical Provider, MD  loratadine (CLARITIN) 10 MG tablet Take 10 mg by mouth daily.   Yes Historical Provider, MD  metoprolol succinate (TOPROL-XL) 25 MG 24 hr tablet Take 25 mg by mouth daily.   Yes Historical Provider, MD  multivitamin [facility]) per tablet Take 1 tablet by mouth daily.     Yes Historical Provider, MD  nitroGLYCERIN (NITROSTAT) 0.4 MG SL tablet Place 1 tablet (0.4 mg total) under the tongue every 5 (five) minutes x 3 doses as needed for chest pain.  04/30/13 05/23/14 Yes Tonny Bollman, MD  oxyCODONE (OXY IR/ROXICODONE) 5 MG immediate release tablet Take 1-2 tablets (5-10 mg total) by mouth every 6 (six) hours as needed for severe pain. 04/25/14  Yes Kerin Perna, MD  traMADol (ULTRAM) 50 MG tablet Take 1 tablet (50 mg total) by mouth every 6 (six) hours as needed. 03/28/14  Yes Kerin Perna, MD  warfarin (COUMADIN) 5 MG tablet Take 2.5-5 mg by mouth. Take 2.5 mg on Monday and 5 mg all other days.   Yes Historical Provider, MD  saccharomyces boulardii (FLORASTOR) 250 MG capsule Take 1 capsule (250 mg total) by mouth 2 (two) times daily. Patient not taking: Reported on 05/23/2014 03/05/14   Wilmon Pali, PA-C   Triage Vitals: BP 140/79 mmHg  Pulse 68  Temp(Src) 98.2 F (36.8 C)  Resp 18  Ht  (1.803 m)  Wt 201 lb (91.173 kg)  BMI 28.05 kg/m2  SpO2 98% Physical Exam  Constitutional: He is oriented to person, place, and time. He appears well-developed and well-nourished. No distress.  HENT:  Head: Normocephalic.  Eyes: Conjunctivae are normal. Pupils are equal, round, and reactive to light. No scleral icterus.  Neck: Normal range of motion. Neck supple. No thyromegaly present.  Cardiovascular: Normal rate and regular rhythm.  Exam reveals no gallop and no friction rub.   No murmur heard. Pulmonary/Chest: Effort normal and breath sounds normal. No respiratory distress. He has no wheezes. He has no rales.  Abdominal: Soft. Bowel sounds are normal. He exhibits no distension. There is no tenderness. There is no rebound.  Musculoskeletal: Normal range of motion.  Neurological: He is alert and oriented to person, place, and time.  Skin: Skin is warm and dry. No rash noted.  Well-healed sternotomy incision.  Psychiatric: He has a normal mood and affect. His behavior is normal.  Nursing note and vitals reviewed.   ED Course  Procedures (including critical care time) DIAGNOSTIC STUDIES: Oxygen Saturation is 98% on room air, normal  by my interpretation.    COORDINATION OF CARE:    Labs Review Labs Reviewed  BASIC METABOLIC PANEL - Abnormal; Notable for the following:    Glucose, Bld 102 (*)    BUN 24 (*)    Creatinine, Ser 1.68 (*)    GFR calc non Af Amer 39 (*)    GFR calc Af Amer 45 (*)    All other components within normal limits  CBC  TROPONIN I  TROPONIN I    Imaging Review No results found.   EKG Interpretation   Date/Time:  Thursday May 23 2014 11:08:21 EDT Ventricular Rate:  70 PR Interval:  170 QRS Duration: 90 QT Interval:  430 QTC Calculation: 464 R Axis:   79 Text Interpretation:  Normal sinus rhythm Low voltage QRS Cannot rule out  Anteroseptal infarct , age undetermined Abnormal ECG ED PHYSICIAN  INTERPRETATION AVAILABLE IN [facility] Confirmed by TEST, Record  ([id number]) on 05/25/2014 4:14:47 PM      MDM   Final diagnoses:  Chest pain, unspecified chest pain type    I personally performed the services described in this documentation, which was scribed in my presence. The recorded information has been reviewed and is accurate.   Patient's description of pain is quite atypical. Last 5 minutes. Her to move. Even her to palpate. Normal chest x-ray. Unchanged EKG. Normal first troponin. Asymptomatic here. I think he would be appropriate for outpatient treatment and the second troponin is normal. With his recent bypass surgery I would not expect a subtle presentation of myocardial injury.  Rolland PorterMark Marijose Curington, MD 05/29/14 2236

## 2014-05-23 NOTE — ED Notes (Signed)
MD at bedside. 

## 2014-05-23 NOTE — ED Notes (Signed)
Chest pain, took 1 ntg with relief of sx  No nausea , no sob   Had cabg in Feb 2016

## 2014-05-24 ENCOUNTER — Encounter (HOSPITAL_COMMUNITY)
Admission: RE | Admit: 2014-05-24 | Discharge: 2014-05-24 | Disposition: A | Discharge status: Home / Self Care | Payer: Medicare Other | Source: Ambulatory Visit | Attending: Cardiovascular Disease | Admitting: Cardiovascular Disease

## 2014-05-24 DIAGNOSIS — I2510 Atherosclerotic heart disease of native coronary artery without angina pectoris: Secondary | ICD-10-CM | POA: Diagnosis not present

## 2014-05-27 ENCOUNTER — Encounter (HOSPITAL_COMMUNITY)
Admission: RE | Admit: 2014-05-27 | Discharge: 2014-05-27 | Disposition: A | Discharge status: Home / Self Care | Payer: Medicare Other | Source: Ambulatory Visit | Attending: Cardiovascular Disease | Admitting: Cardiovascular Disease

## 2014-05-27 DIAGNOSIS — I2510 Atherosclerotic heart disease of native coronary artery without angina pectoris: Secondary | ICD-10-CM | POA: Diagnosis not present

## 2014-05-29 ENCOUNTER — Ambulatory Visit (INDEPENDENT_AMBULATORY_CARE_PROVIDER_SITE_OTHER): Payer: Medicare Other | Admitting: *Deleted

## 2014-05-29 ENCOUNTER — Encounter (HOSPITAL_COMMUNITY)
Admission: RE | Admit: 2014-05-29 | Discharge: 2014-05-29 | Disposition: A | Discharge status: Home / Self Care | Payer: Medicare Other | Source: Ambulatory Visit | Attending: Cardiovascular Disease | Admitting: Cardiovascular Disease

## 2014-05-29 DIAGNOSIS — I480 Paroxysmal atrial fibrillation: Secondary | ICD-10-CM

## 2014-05-29 DIAGNOSIS — I2510 Atherosclerotic heart disease of native coronary artery without angina pectoris: Secondary | ICD-10-CM | POA: Diagnosis not present

## 2014-05-29 DIAGNOSIS — Z5181 Encounter for therapeutic drug level monitoring: Secondary | ICD-10-CM

## 2014-05-29 LAB — POCT INR: INR: 2.4

## 2014-05-31 ENCOUNTER — Encounter (HOSPITAL_COMMUNITY)
Admission: RE | Admit: 2014-05-31 | Discharge: 2014-05-31 | Disposition: A | Discharge status: Home / Self Care | Payer: Medicare Other | Source: Ambulatory Visit | Attending: Cardiovascular Disease | Admitting: Cardiovascular Disease

## 2014-05-31 DIAGNOSIS — I2510 Atherosclerotic heart disease of native coronary artery without angina pectoris: Secondary | ICD-10-CM | POA: Diagnosis not present

## 2014-06-03 ENCOUNTER — Encounter (HOSPITAL_COMMUNITY): Payer: Medicare Other

## 2014-06-05 ENCOUNTER — Encounter (HOSPITAL_COMMUNITY): Payer: Medicare Other

## 2014-06-07 ENCOUNTER — Encounter (HOSPITAL_COMMUNITY)
Admission: RE | Admit: 2014-06-07 | Discharge: 2014-06-07 | Disposition: A | Discharge status: Home / Self Care | Payer: Medicare Other | Source: Ambulatory Visit | Attending: Cardiovascular Disease | Admitting: Cardiovascular Disease

## 2014-06-07 DIAGNOSIS — Z951 Presence of aortocoronary bypass graft: Secondary | ICD-10-CM | POA: Insufficient documentation

## 2014-06-07 DIAGNOSIS — I252 Old myocardial infarction: Secondary | ICD-10-CM | POA: Insufficient documentation

## 2014-06-07 DIAGNOSIS — I2510 Atherosclerotic heart disease of native coronary artery without angina pectoris: Secondary | ICD-10-CM | POA: Insufficient documentation

## 2014-06-10 ENCOUNTER — Other Ambulatory Visit: Payer: Self-pay | Admitting: Physician Assistant

## 2014-06-10 ENCOUNTER — Encounter (HOSPITAL_COMMUNITY)
Admission: RE | Admit: 2014-06-10 | Discharge: 2014-06-10 | Disposition: A | Discharge status: Home / Self Care | Payer: Medicare Other | Source: Ambulatory Visit | Attending: Cardiovascular Disease | Admitting: Cardiovascular Disease

## 2014-06-10 DIAGNOSIS — I2510 Atherosclerotic heart disease of native coronary artery without angina pectoris: Secondary | ICD-10-CM | POA: Diagnosis not present

## 2014-06-12 ENCOUNTER — Ambulatory Visit (INDEPENDENT_AMBULATORY_CARE_PROVIDER_SITE_OTHER): Payer: Medicare Other | Admitting: *Deleted

## 2014-06-12 ENCOUNTER — Encounter (HOSPITAL_COMMUNITY)
Admission: RE | Admit: 2014-06-12 | Discharge: 2014-06-12 | Disposition: A | Discharge status: Home / Self Care | Payer: Medicare Other | Source: Ambulatory Visit | Attending: Cardiovascular Disease | Admitting: Cardiovascular Disease

## 2014-06-12 DIAGNOSIS — Z5181 Encounter for therapeutic drug level monitoring: Secondary | ICD-10-CM

## 2014-06-12 DIAGNOSIS — I480 Paroxysmal atrial fibrillation: Secondary | ICD-10-CM | POA: Diagnosis not present

## 2014-06-12 DIAGNOSIS — I2510 Atherosclerotic heart disease of native coronary artery without angina pectoris: Secondary | ICD-10-CM | POA: Diagnosis not present

## 2014-06-12 LAB — POCT INR: INR: 2.2

## 2014-06-14 ENCOUNTER — Encounter (HOSPITAL_COMMUNITY)
Admission: RE | Admit: 2014-06-14 | Discharge: 2014-06-14 | Disposition: A | Discharge status: Home / Self Care | Payer: Medicare Other | Source: Ambulatory Visit | Attending: Cardiovascular Disease | Admitting: Cardiovascular Disease

## 2014-06-14 DIAGNOSIS — I2510 Atherosclerotic heart disease of native coronary artery without angina pectoris: Secondary | ICD-10-CM | POA: Diagnosis not present

## 2014-06-17 ENCOUNTER — Encounter (HOSPITAL_COMMUNITY)
Admission: RE | Admit: 2014-06-17 | Discharge: 2014-06-17 | Disposition: A | Discharge status: Home / Self Care | Payer: Medicare Other | Source: Ambulatory Visit | Attending: Cardiovascular Disease | Admitting: Cardiovascular Disease

## 2014-06-17 DIAGNOSIS — I2510 Atherosclerotic heart disease of native coronary artery without angina pectoris: Secondary | ICD-10-CM | POA: Diagnosis not present

## 2014-06-19 ENCOUNTER — Encounter (HOSPITAL_COMMUNITY)
Admission: RE | Admit: 2014-06-19 | Discharge: 2014-06-19 | Disposition: A | Discharge status: Home / Self Care | Payer: Medicare Other | Source: Ambulatory Visit | Attending: Cardiovascular Disease | Admitting: Cardiovascular Disease

## 2014-06-19 DIAGNOSIS — I2510 Atherosclerotic heart disease of native coronary artery without angina pectoris: Secondary | ICD-10-CM | POA: Diagnosis not present

## 2014-06-21 ENCOUNTER — Encounter (HOSPITAL_COMMUNITY)
Admission: RE | Admit: 2014-06-21 | Discharge: 2014-06-21 | Disposition: A | Discharge status: Home / Self Care | Payer: Medicare Other | Source: Ambulatory Visit | Attending: Cardiovascular Disease | Admitting: Cardiovascular Disease

## 2014-06-21 DIAGNOSIS — I2510 Atherosclerotic heart disease of native coronary artery without angina pectoris: Secondary | ICD-10-CM | POA: Diagnosis not present

## 2014-06-24 ENCOUNTER — Encounter (HOSPITAL_COMMUNITY): Payer: Medicare Other

## 2014-06-28 NOTE — Progress Notes (Signed)
Cardiac Rehabilitation Program Outcomes Report   Orientation:  03/19/14 Graduate Date:  06/14/14 Discharge Date:  06/14/14 # of sessions completed: 36  Cardiologist: Clabe Seal MD:  Glenetta Hew Time:  0930  A.  Exercise Program:  Tolerates exercise @ 4.10 METS for 15 minutes, Walk Test Results:  Post: 2.67 mets, Improved functional capacity  27.6 %, Improved  muscular strength  34.5 % and Improved  flexibility 15.7 %  B.  Mental Health:  Good mental attitude  C.  Education/Instruction/Skills  Accurately checks own pulse.  Rest:  78  Exercise:  100, Knows THR for exercise and Attended all education classes  Uses Perceived Exertion Scale and/or Dyspnea Scale  D.  Nutrition/Weight Control/Body Composition:  Adherence to prescribed nutrition program: good    E.  Blood Lipids    Lab Results  Component Value Date   CHOL 111 02/13/2014   HDL 34* 02/13/2014   LDLCALC 59 02/13/2014   TRIG 92 02/13/2014   CHOLHDL 3.3 02/13/2014    F.  Lifestyle Changes:  Making positive lifestyle changes  G.  Symptoms noted with exercise:  Asymptomatic  Report Completed By:  Doretha Sou RN   Comments:  Patient has graduated [facility].  Patient has done very well in program and plans to exercise at home.

## 2014-06-28 NOTE — Progress Notes (Signed)
Patient is discharged from [facility] today, June 21, 2014 with 36 sessions.  He achieved LTG of 30 minutes of aerobic exercise at max met level of 4.10.  All patient vitals are WNL.  Patient has not met with dietician.  Discharge instructions have been reviewed in detail and patient expressed an understanding of material given.  Patient plans to exercise at home. Cardiac Rehab will make 1 month, 6 month and 1 year call backs.  Patient had no complaints of any abnormal S/S or pain on their exit visit.  Patient finished post walk test.

## 2014-07-02 ENCOUNTER — Other Ambulatory Visit: Payer: Self-pay | Admitting: Physician Assistant

## 2014-07-04 ENCOUNTER — Ambulatory Visit (INDEPENDENT_AMBULATORY_CARE_PROVIDER_SITE_OTHER): Payer: Medicare Other | Admitting: *Deleted

## 2014-07-04 DIAGNOSIS — I480 Paroxysmal atrial fibrillation: Secondary | ICD-10-CM | POA: Diagnosis not present

## 2014-07-04 DIAGNOSIS — Z5181 Encounter for therapeutic drug level monitoring: Secondary | ICD-10-CM

## 2014-07-04 LAB — POCT INR: INR: 3

## 2014-08-15 ENCOUNTER — Ambulatory Visit (INDEPENDENT_AMBULATORY_CARE_PROVIDER_SITE_OTHER): Payer: Medicare Other | Admitting: *Deleted

## 2014-08-15 DIAGNOSIS — Z5181 Encounter for therapeutic drug level monitoring: Secondary | ICD-10-CM | POA: Diagnosis not present

## 2014-08-15 DIAGNOSIS — I480 Paroxysmal atrial fibrillation: Secondary | ICD-10-CM | POA: Diagnosis not present

## 2014-08-15 LAB — POCT INR: INR: 2.8

## 2014-09-26 ENCOUNTER — Ambulatory Visit (INDEPENDENT_AMBULATORY_CARE_PROVIDER_SITE_OTHER): Payer: Medicare Other | Admitting: *Deleted

## 2014-09-26 DIAGNOSIS — I480 Paroxysmal atrial fibrillation: Secondary | ICD-10-CM

## 2014-09-26 DIAGNOSIS — Z5181 Encounter for therapeutic drug level monitoring: Secondary | ICD-10-CM | POA: Diagnosis not present

## 2014-09-26 LAB — POCT INR: INR: 3.2

## 2014-10-10 ENCOUNTER — Other Ambulatory Visit: Payer: Self-pay | Admitting: Cardiovascular Disease

## 2014-10-10 ENCOUNTER — Other Ambulatory Visit: Payer: Self-pay | Admitting: Physician Assistant

## 2014-10-10 NOTE — Telephone Encounter (Signed)
Ok to refill 

## 2014-10-10 NOTE — Telephone Encounter (Signed)
Just wanted to clarify that this should still be amiodarone  qd as the pharmacy is requesting  bid. Please advise. Thanks, MI

## 2014-10-11 NOTE — Telephone Encounter (Signed)
Spoke with patient and he indicated that he takes amiodarone  daily.

## 2014-10-14 ENCOUNTER — Other Ambulatory Visit: Payer: Self-pay | Admitting: Cardiovascular Disease

## 2014-10-14 NOTE — Telephone Encounter (Signed)
Pt requesting a refill

## 2014-10-22 ENCOUNTER — Ambulatory Visit (INDEPENDENT_AMBULATORY_CARE_PROVIDER_SITE_OTHER): Payer: Medicare Other | Admitting: *Deleted

## 2014-10-22 DIAGNOSIS — Z5181 Encounter for therapeutic drug level monitoring: Secondary | ICD-10-CM | POA: Diagnosis not present

## 2014-10-22 DIAGNOSIS — I480 Paroxysmal atrial fibrillation: Secondary | ICD-10-CM | POA: Diagnosis not present

## 2014-10-22 LAB — POCT INR: INR: 2.4

## 2014-11-26 ENCOUNTER — Ambulatory Visit (INDEPENDENT_AMBULATORY_CARE_PROVIDER_SITE_OTHER): Payer: Medicare Other | Admitting: *Deleted

## 2014-11-26 DIAGNOSIS — I480 Paroxysmal atrial fibrillation: Secondary | ICD-10-CM

## 2014-11-26 DIAGNOSIS — Z5181 Encounter for therapeutic drug level monitoring: Secondary | ICD-10-CM | POA: Diagnosis not present

## 2014-11-26 LAB — POCT INR: INR: 2.5

## 2014-12-12 ENCOUNTER — Other Ambulatory Visit: Payer: Self-pay | Admitting: Cardiovascular Disease

## 2015-01-02 ENCOUNTER — Encounter: Payer: Self-pay | Admitting: Cardiovascular Disease

## 2015-01-02 ENCOUNTER — Ambulatory Visit (INDEPENDENT_AMBULATORY_CARE_PROVIDER_SITE_OTHER): Payer: Medicare Other | Admitting: Cardiovascular Disease

## 2015-01-02 VITALS — BP 122/70 | HR 68 | Ht 71.0 in | Wt 209.6 lb

## 2015-01-02 DIAGNOSIS — I2510 Atherosclerotic heart disease of native coronary artery without angina pectoris: Secondary | ICD-10-CM | POA: Diagnosis not present

## 2015-01-02 DIAGNOSIS — I259 Chronic ischemic heart disease, unspecified: Secondary | ICD-10-CM

## 2015-01-02 DIAGNOSIS — I10 Essential (primary) hypertension: Secondary | ICD-10-CM

## 2015-01-02 DIAGNOSIS — I480 Paroxysmal atrial fibrillation: Secondary | ICD-10-CM

## 2015-01-02 LAB — COMPREHENSIVE METABOLIC PANEL
ALT: 55 U/L — ABNORMAL HIGH (ref 9–46)
AST: 49 U/L — ABNORMAL HIGH (ref 10–35)
Albumin: 4.3 g/dL (ref 3.6–5.1)
Alkaline Phosphatase: 87 U/L (ref 40–115)
BUN: 28 mg/dL — ABNORMAL HIGH (ref 7–25)
CO2: 23 mmol/L (ref 20–31)
Calcium: 9.8 mg/dL (ref 8.6–10.3)
Chloride: 102 mmol/L (ref 98–110)
Creat: 1.9 mg/dL — ABNORMAL HIGH (ref 0.70–1.18)
Glucose, Bld: 93 mg/dL (ref 65–99)
Potassium: 5.4 mmol/L — ABNORMAL HIGH (ref 3.5–5.3)
Sodium: 136 mmol/L (ref 135–146)
Total Bilirubin: 0.5 mg/dL (ref 0.2–1.2)
Total Protein: 8.6 g/dL — ABNORMAL HIGH (ref 6.1–8.1)

## 2015-01-02 LAB — TSH: TSH: 3.374 u[IU]/mL (ref 0.350–4.500)

## 2015-01-02 LAB — T4, FREE: Free T4: 1.13 ng/dL (ref 0.80–1.80)

## 2015-01-02 MED ORDER — CARVEDILOL 6.25 MG PO TABS: 6.2500 mg | ORAL_TABLET | Freq: Two times a day (BID) | ORAL | Status: DC

## 2015-01-02 NOTE — Telephone Encounter (Signed)
This encounter was created in error - please disregard.

## 2015-01-02 NOTE — Telephone Encounter (Signed)
Follow Up  Pt called req a call back. No further details has additional info for the nurse

## 2015-01-02 NOTE — Progress Notes (Signed)
Cardiology Office Note Date:  01/02/2015   ID:  Gregory Davila, DOB Mar 15, 1940, MRN [id number]  PCP:  [name], MD  Cardiologist:  Tonny Bollman, MD    Chief Complaint  Patient presents with  . routine 6 month exam   History of Present Illness: Gregory Davila is a 74 y.o. male who presents for follow-up evaluation. He was last seen in May 2016. He underwent CABG in February after presenting with NSTEMI. He has longstanding CAD with known chronic occlusion of the LAD and presented with a severe stenosis of the proximal circumflex, ultimately undergoing treatment with CABG. He also has longstanding paroxysmal atrial fibrillation and has been chronically anticoagulated with warfarin.   Overall he is doing well. He complains of mild chest soreness related to his sternotomy. Otherwise no complaints. Today, he denies symptoms of palpitations,  shortness of breath, orthopnea, PND, lower extremity edema, dizziness, or syncope. He's had a cough over the past few days and some sinus congestion. He's been compliant with medications.   Past Medical History  Diagnosis Date  . Paroxysmal atrial fibrillation (HCC)     a. On Coumadin therapy/amidarone. b. 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  [name] CAD (coronary artery disease)     a, 2011: occluded LAD with left to right collaterals and moderate disease in the right coronary artery and circumflex, EF 50%. b. NSTEMI 07/2011 in setting of AF-RVR, MV abnl, cath w/ LAD 100%, RCA mod-severe dz, small vessel, med rx  . Hypertension     long-standing  . Chronic renal insufficiency     followed by primary care physician  . Pulmonary embolism (HCC)   . Gout   . Anginal pain (HCC)   . Myocardial infarction [facility])     Past Surgical History  Procedure Laterality Date  . Cardiac catheterization  2011 & 2013    2013 L [place], LAD 100%, CFX 50%, RCA 80%, small vessel, med rx  . Nm myoview ltd  2013    Scar in the  anteroseptal/periapical distribution with moderate peri-infarct ischemia, EF 43%  . Left heart catheterization with coronary angiogram N/A 02/08/2014    Procedure: LEFT HEART CATHETERIZATION WITH CORONARY ANGIOGRAM;  Surgeon: Micheline Chapman, MD;  [facility];  Service: Cardiovascular;  Laterality: N/A;  . Coronary artery bypass graft N/A 02/14/2014    Procedure: CORONARY ARTERY BYPASS GRAFTING (CABG)X4 LIMA-LAD; SVG-RCA(ENDARDERECTOMY); SVG-OM; SVG-DIAG;  Surgeon: Kerin Perna, MD;  Location: [facility] OR;  Service: Open Heart Surgery;  Laterality: N/A;  . Tee without cardioversion N/A 02/14/2014    Procedure: TRANSESOPHAGEAL ECHOCARDIOGRAM (TEE);  Surgeon: Kerin Perna, MD;  [facility] OR;  Service: Open Heart Surgery;  Laterality: N/A;  . Total knee arthroplasty Bilateral   . Appendectomy    . Sternal incision reclosure N/A 02/28/2014    Procedure: Irrigation and Debridement of Sternum with Sternal Rewiring;  Surgeon: Kerin Perna, MD;  Location: [facility] OR;  Service: Thoracic;  Laterality: N/A;    Current Outpatient Prescriptions  Medication Sig Dispense Refill  . allopurinol (ZYLOPRIM) 300 MG tablet Take 0.5 tablets (150 mg total) by mouth daily. 30 tablet 1  . amiodarone (PACERONE) 200 MG tablet Take 0.5 tablets (100 mg total) by mouth daily. 45 tablet 0  . atorvastatin (LIPITOR) 40 MG tablet Take 1 tablet (40 mg total) by mouth daily at 6 PM. 30 tablet 1  . carvedilol (COREG) 3.125 MG tablet Take 1 tablet (3.125 mg total)  by mouth 2 (two) times daily with a meal. 60 tablet 11  . cetirizine (ZYRTEC) 10 MG tablet Take 10 mg by mouth daily.    [name] COLCRYS 0.6 MG tablet Take 1 tablet by mouth 2 (two) times daily as needed (gout).     . fluticasone (FLONASE) 50 MCG/ACT nasal spray Place 1 spray into both nostrils daily as needed for allergies or rhinitis.    . multivitamin (THERAGRAN) per tablet Take 1 tablet by mouth daily.      [name] warfarin (COUMADIN) 5 MG tablet Take 1 tablet (5 mg  total) by mouth daily. Or as directed 30 tablet 3  . nitroGLYCERIN (NITROSTAT) 0.4 MG SL tablet Place 1 tablet (0.4 mg total) under the tongue every 5 (five) minutes x 3 doses as needed for chest pain. 25 tablet 2   No current facility-administered medications for this visit.    Allergies:   Review of patient's allergies indicates no known allergies.   Social History:  The patient  reports that he quit smoking about 49 years ago. His smoking use included Cigarettes. He quit smokeless tobacco use about 7 years ago. His smokeless tobacco use included Chew. He reports that he drinks alcohol. He reports that he does not use illicit drugs.   Family History:  The patient's  family history includes Cancer in his mother; Coronary artery disease in his brother; Heart attack in his mother; Prostate cancer in his father.    ROS:  Please see the history of present illness.  Otherwise, review of systems is positive for easy bruising, cough.  All other systems are reviewed and negative.    PHYSICAL EXAM: VS:  BP 122/70 mmHg  Pulse 68  Ht  (1.803 m)  Wt 209 lb 9.6 oz (95.074 kg)  BMI 29.25 kg/m2 , BMI Body mass index is 29.25 kg/(m^2). GEN: Well nourished, well developed, in no acute distress HEENT: normal Neck: no JVD, no masses. No carotid bruits Cardiac: RRR without murmur or gallop                Respiratory:  clear to auscultation bilaterally, normal work of breathing GI: soft, nontender, nondistended, + BS MS: no deformity or atrophy Ext: no pretibial edema, pedal pulses 2+= bilaterally Skin: warm and dry, no rash Neuro:  Strength and sensation are intact Psych: euthymic mood, full affect  EKG:  EKG is ordered today. The ekg ordered today shows NSR 65 bpm, low voltage QRS, anteroseptal infarct age-undetermined  Recent Labs: 02/04/2014: TSH 2.884 02/05/2014: ALT 59* 02/07/2014: B Natriuretic Peptide 72.3 03/03/2014: Magnesium 1.7 05/23/2014: BUN 24*; Creatinine, Ser 1.68*; Hemoglobin  13.7; Platelets 279; Potassium 4.2; Sodium 138   Lipid Panel     Component Value Date/Time   CHOL 111 02/13/2014 0230   TRIG 92 02/13/2014 0230   HDL 34* 02/13/2014 0230   CHOLHDL 3.3 02/13/2014 0230   VLDL 18 02/13/2014 0230   LDLCALC 59 02/13/2014 0230      Wt Readings from Last 3 Encounters:  01/02/15 209 lb 9.6 oz (95.074 kg)  05/23/14 201 lb (91.173 kg)  05/06/14 206 lb (93.441 kg)     Cardiac Studies Reviewed: 2D Echo 02/09/2014: Study Conclusions  - Left ventricle: The cavity size was normal. Wall thickness was increased in a pattern of moderate LVH. Systolic function was mildly to moderately reduced. The estimated ejection fraction was in the range of 40% to 45%. Septal hypokinesis. Doppler parameters are consistent with abnormal left ventricular relaxation (grade 1 diastolic  dysfunction). - Aortic valve: There was no stenosis. There was mild regurgitation. - Mitral valve: Mildly calcified annulus. Mildly calcified leaflets . There was mild regurgitation. - Right ventricle: The cavity size was normal. Systolic function was normal. - Pulmonary arteries: No complete TR doppler jet so unable to estimate PA systolic pressure. - Systemic veins: IVC measured 2.5 cm with no significant respirophasic variation, estimate RA pressure 15 mmHg.  Impressions:  - Normal LV size with moderate LV hypertrophy. EF 40-45% with septal hypokinesis. Normal RV size and systolic function. Mild AI, mild MR.  ASSESSMENT AND PLAN: 1. CAD, native vessel: No anginal symptoms. Medical therapy reviewed and will be continued without changes.   2. Paroxysmal atrial fibrillation: Maintaining sinus rhythm on low-dose amiodarone. Tolerating long-term warfarin without bleeding problems. Will update labs for amiodarone monitoring (TSH, LFT's).   3. Essential hypertension: Blood pressure has been well controlled.  4. Hyperlipidemia: Lipids followed by primary care. The  patient is treated with atorvastatin 40 mg daily.  5. Ischemic cardiomyopathy: LVEF 40-45% by last echo, wall motion c/w old anteroseptal MI. No symptoms of CHF. Treated with low-dose carvedilol.   Current medicines are reviewed with the patient today.  The patient does not have concerns regarding medicines.  Labs/ tests ordered today include:  No orders of the defined types were placed in this encounter.   Disposition:   FU 6 months  Signed, Tonny Bollmanooper, Rogena Deupree, MD  01/02/2015 9:29 AM    [facility] [address], [place], [place]  [address] Phone: [phone]; Fax: [phone]

## 2015-01-02 NOTE — Patient Instructions (Addendum)
Medication Instructions:  Your physician recommends that you continue on your current medications as directed. Please refer to the Current Medication list given to you today.  Labwork: Your physician recommends that you have lab work today: CMP, TSH and Free T4  Testing/Procedures: No new orders.   Follow-Up: Your physician wants you to follow-up in: 6 MONTHS with Dr Cooper.  You will receive a reminder letter in the mail two months in advance. If you don't receive a letter, please call our office to schedule the follow-up appointment.   Any Other Special Instructions Will Be Listed Below (If Applicable).     If you need a refill on your cardiac medications before your next appointment, please call your pharmacy.   

## 2015-01-03 ENCOUNTER — Telehealth: Payer: Self-pay | Admitting: Cardiovascular Disease

## 2015-01-03 DIAGNOSIS — I10 Essential (primary) hypertension: Secondary | ICD-10-CM

## 2015-01-03 NOTE — Telephone Encounter (Signed)
NEW MESSAGE    PT RETURNING CALL

## 2015-01-03 NOTE — Telephone Encounter (Signed)
I spoke with the pt and made him aware of lab results.  The pt will have a repeat CMP on 04/01/15.

## 2015-01-07 ENCOUNTER — Ambulatory Visit (INDEPENDENT_AMBULATORY_CARE_PROVIDER_SITE_OTHER): Payer: Medicare Other | Admitting: *Deleted

## 2015-01-07 DIAGNOSIS — Z5181 Encounter for therapeutic drug level monitoring: Secondary | ICD-10-CM | POA: Diagnosis not present

## 2015-01-07 DIAGNOSIS — I480 Paroxysmal atrial fibrillation: Secondary | ICD-10-CM | POA: Diagnosis not present

## 2015-01-07 LAB — POCT INR: INR: 2.4

## 2015-01-10 ENCOUNTER — Other Ambulatory Visit: Payer: Self-pay | Admitting: Cardiovascular Disease

## 2015-02-18 ENCOUNTER — Ambulatory Visit (INDEPENDENT_AMBULATORY_CARE_PROVIDER_SITE_OTHER): Payer: Medicare Other | Admitting: *Deleted

## 2015-02-18 DIAGNOSIS — Z5181 Encounter for therapeutic drug level monitoring: Secondary | ICD-10-CM | POA: Diagnosis not present

## 2015-02-18 DIAGNOSIS — I480 Paroxysmal atrial fibrillation: Secondary | ICD-10-CM

## 2015-02-18 LAB — POCT INR: INR: 1.7

## 2015-03-14 ENCOUNTER — Ambulatory Visit (INDEPENDENT_AMBULATORY_CARE_PROVIDER_SITE_OTHER): Payer: Medicare Other | Admitting: *Deleted

## 2015-03-14 DIAGNOSIS — I480 Paroxysmal atrial fibrillation: Secondary | ICD-10-CM

## 2015-03-14 DIAGNOSIS — Z5181 Encounter for therapeutic drug level monitoring: Secondary | ICD-10-CM | POA: Diagnosis not present

## 2015-03-14 LAB — POCT INR: INR: 2.3

## 2015-03-18 ENCOUNTER — Other Ambulatory Visit: Payer: Self-pay | Admitting: Cardiovascular Disease

## 2015-03-19 ENCOUNTER — Other Ambulatory Visit: Payer: Self-pay

## 2015-03-19 MED ORDER — AMIODARONE HCL 200 MG PO TABS: ORAL_TABLET | ORAL | Status: DC

## 2015-04-01 ENCOUNTER — Other Ambulatory Visit (INDEPENDENT_AMBULATORY_CARE_PROVIDER_SITE_OTHER): Payer: Medicare Other | Admitting: *Deleted

## 2015-04-01 DIAGNOSIS — E790 Hyperuricemia without signs of inflammatory arthritis and tophaceous disease: Secondary | ICD-10-CM | POA: Diagnosis not present

## 2015-04-01 DIAGNOSIS — E7800 Pure hypercholesterolemia, unspecified: Secondary | ICD-10-CM

## 2015-04-01 DIAGNOSIS — I10 Essential (primary) hypertension: Secondary | ICD-10-CM

## 2015-04-01 LAB — COMPREHENSIVE METABOLIC PANEL
ALT: 37 U/L (ref 9–46)
AST: 35 U/L (ref 10–35)
Albumin: 3.9 g/dL (ref 3.6–5.1)
Alkaline Phosphatase: 131 U/L — ABNORMAL HIGH (ref 40–115)
BUN: 23 mg/dL (ref 7–25)
CO2: 23 mmol/L (ref 20–31)
Calcium: 9 mg/dL (ref 8.6–10.3)
Chloride: 107 mmol/L (ref 98–110)
Creat: 1.55 mg/dL — ABNORMAL HIGH (ref 0.70–1.18)
Glucose, Bld: 94 mg/dL (ref 65–99)
Potassium: 4.7 mmol/L (ref 3.5–5.3)
Sodium: 139 mmol/L (ref 135–146)
Total Bilirubin: 0.4 mg/dL (ref 0.2–1.2)
Total Protein: 7.1 g/dL (ref 6.1–8.1)

## 2015-04-01 LAB — URIC ACID: Uric Acid, Serum: 6.5 mg/dL (ref 4.0–7.8)

## 2015-04-01 LAB — LIPID PANEL
Cholesterol: 122 mg/dL — ABNORMAL LOW (ref 125–200)
HDL: 37 mg/dL — ABNORMAL LOW (ref >=40)
LDL Cholesterol: 63 mg/dL (ref <130)
Total CHOL/HDL Ratio: 3.3 Ratio (ref <=5.0)
Triglycerides: 108 mg/dL (ref <150)
VLDL: 22 mg/dL (ref <30)

## 2015-04-10 ENCOUNTER — Ambulatory Visit (INDEPENDENT_AMBULATORY_CARE_PROVIDER_SITE_OTHER): Payer: Medicare Other | Admitting: *Deleted

## 2015-04-10 DIAGNOSIS — I480 Paroxysmal atrial fibrillation: Secondary | ICD-10-CM | POA: Diagnosis not present

## 2015-04-10 DIAGNOSIS — Z5181 Encounter for therapeutic drug level monitoring: Secondary | ICD-10-CM | POA: Diagnosis not present

## 2015-04-10 LAB — POCT INR: INR: 1.9

## 2015-04-24 ENCOUNTER — Other Ambulatory Visit: Payer: Self-pay | Admitting: Cardiovascular Disease

## 2015-04-24 NOTE — Telephone Encounter (Signed)
amiodarone (PACERONE) 200 MG tablet  Medication   Date: 03/19/2015  [facility] [place]  Ordering/Authorizing: Tonny BollmanMichael Cooper, MD      Order Providers    Prescribing Provider Encounter Provider   Tonny BollmanMichael Cooper, MD Drue Dunhelsie S Lowe, CMA    Medication Detail      Disp Refills Start End     amiodarone (PACERONE) 200 MG tablet 15 tablet 6 03/19/2015     Sig: TAKE (1/2) TABLET BY MOUTH DAILY.    E-Prescribing Status: Receipt confirmed by pharmacy (03/19/2015 10:43 AM EDT)     Pharmacy    [facility] - [place], [place] - [address]

## 2015-05-06 ENCOUNTER — Ambulatory Visit (INDEPENDENT_AMBULATORY_CARE_PROVIDER_SITE_OTHER): Payer: Medicare Other | Admitting: *Deleted

## 2015-05-06 DIAGNOSIS — Z5181 Encounter for therapeutic drug level monitoring: Secondary | ICD-10-CM

## 2015-05-06 DIAGNOSIS — I480 Paroxysmal atrial fibrillation: Secondary | ICD-10-CM

## 2015-05-06 LAB — POCT INR: INR: 2.5

## 2015-06-05 ENCOUNTER — Ambulatory Visit (INDEPENDENT_AMBULATORY_CARE_PROVIDER_SITE_OTHER): Payer: Medicare Other | Admitting: *Deleted

## 2015-06-05 DIAGNOSIS — Z5181 Encounter for therapeutic drug level monitoring: Secondary | ICD-10-CM

## 2015-06-05 DIAGNOSIS — I480 Paroxysmal atrial fibrillation: Secondary | ICD-10-CM

## 2015-06-05 LAB — POCT INR: INR: 2.1

## 2015-07-10 ENCOUNTER — Ambulatory Visit (INDEPENDENT_AMBULATORY_CARE_PROVIDER_SITE_OTHER): Payer: Medicare Other | Admitting: *Deleted

## 2015-07-10 DIAGNOSIS — Z5181 Encounter for therapeutic drug level monitoring: Secondary | ICD-10-CM | POA: Diagnosis not present

## 2015-07-10 DIAGNOSIS — I480 Paroxysmal atrial fibrillation: Secondary | ICD-10-CM | POA: Diagnosis not present

## 2015-07-10 LAB — POCT INR: INR: 2.3

## 2015-08-21 ENCOUNTER — Ambulatory Visit (INDEPENDENT_AMBULATORY_CARE_PROVIDER_SITE_OTHER): Payer: Medicare Other | Admitting: *Deleted

## 2015-08-21 DIAGNOSIS — Z5181 Encounter for therapeutic drug level monitoring: Secondary | ICD-10-CM | POA: Diagnosis not present

## 2015-08-21 DIAGNOSIS — I480 Paroxysmal atrial fibrillation: Secondary | ICD-10-CM

## 2015-08-21 LAB — POCT INR: INR: 2.2

## 2015-09-02 ENCOUNTER — Other Ambulatory Visit: Payer: Self-pay | Admitting: Cardiovascular Disease

## 2015-10-07 ENCOUNTER — Ambulatory Visit (INDEPENDENT_AMBULATORY_CARE_PROVIDER_SITE_OTHER): Payer: Medicare Other | Admitting: *Deleted

## 2015-10-07 DIAGNOSIS — Z5181 Encounter for therapeutic drug level monitoring: Secondary | ICD-10-CM | POA: Diagnosis not present

## 2015-10-07 DIAGNOSIS — I480 Paroxysmal atrial fibrillation: Secondary | ICD-10-CM | POA: Diagnosis not present

## 2015-10-07 LAB — POCT INR: INR: 2.8

## 2015-11-18 ENCOUNTER — Ambulatory Visit (INDEPENDENT_AMBULATORY_CARE_PROVIDER_SITE_OTHER): Payer: Medicare Other | Admitting: *Deleted

## 2015-11-18 DIAGNOSIS — I480 Paroxysmal atrial fibrillation: Secondary | ICD-10-CM | POA: Diagnosis not present

## 2015-11-18 DIAGNOSIS — Z5181 Encounter for therapeutic drug level monitoring: Secondary | ICD-10-CM | POA: Diagnosis not present

## 2015-11-18 LAB — POCT INR: INR: 2.7

## 2015-12-14 IMAGING — CR DG CHEST 1V PORT
1 series · 1 of 1 positions shown · non-contrast
Comparison: 02/17/2014.

CLINICAL DATA: CABG.

EXAM:
PORTABLE CHEST - 1 VIEW

[AP]
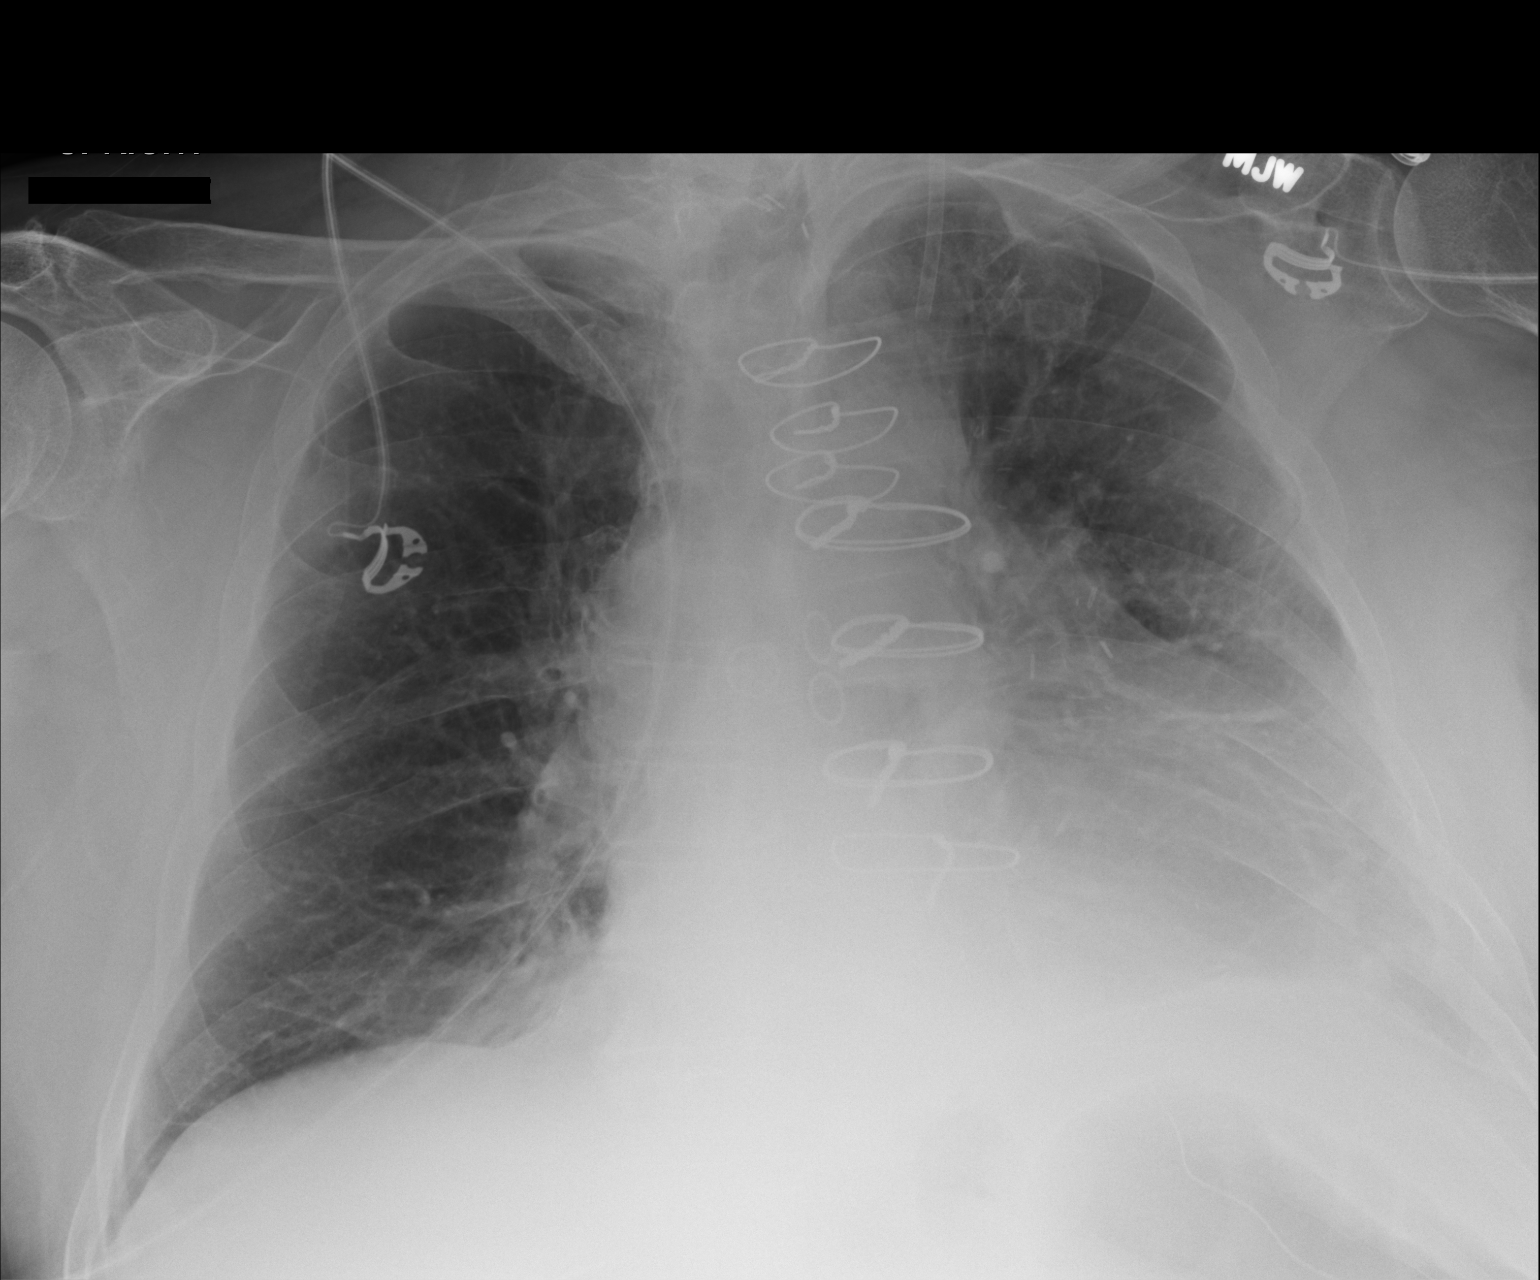

[1 of 1 positions shown; findings below may reference images not displayed]

FINDINGS: Left IJ sheath in good anatomic position. Mediastinum and hilar
structures are normal. Prior CABG. Cardiomegaly with normal
pulmonary vascularity. Bibasilar atelectasis with small left pleural
effusion again noted. No acute bony abnormality identified.
IMPRESSION: 1. Left IJ sheath in stable position.
2. Bibasilar atelectasis and small left pleural effusion. No interim
change.
3. Cardiomegaly with normal pulmonary vascularity.  Prior CABG.

## 2015-12-14 IMAGING — CR DG CHEST 1V PORT
1 series · 1 of 1 positions shown · non-contrast
Comparison: 9838 hours the same day and earlier.

CLINICAL DATA: 74-year-old male PICC line placement. Initial
encounter.

EXAM:
PORTABLE CHEST - 1 VIEW

[AP]
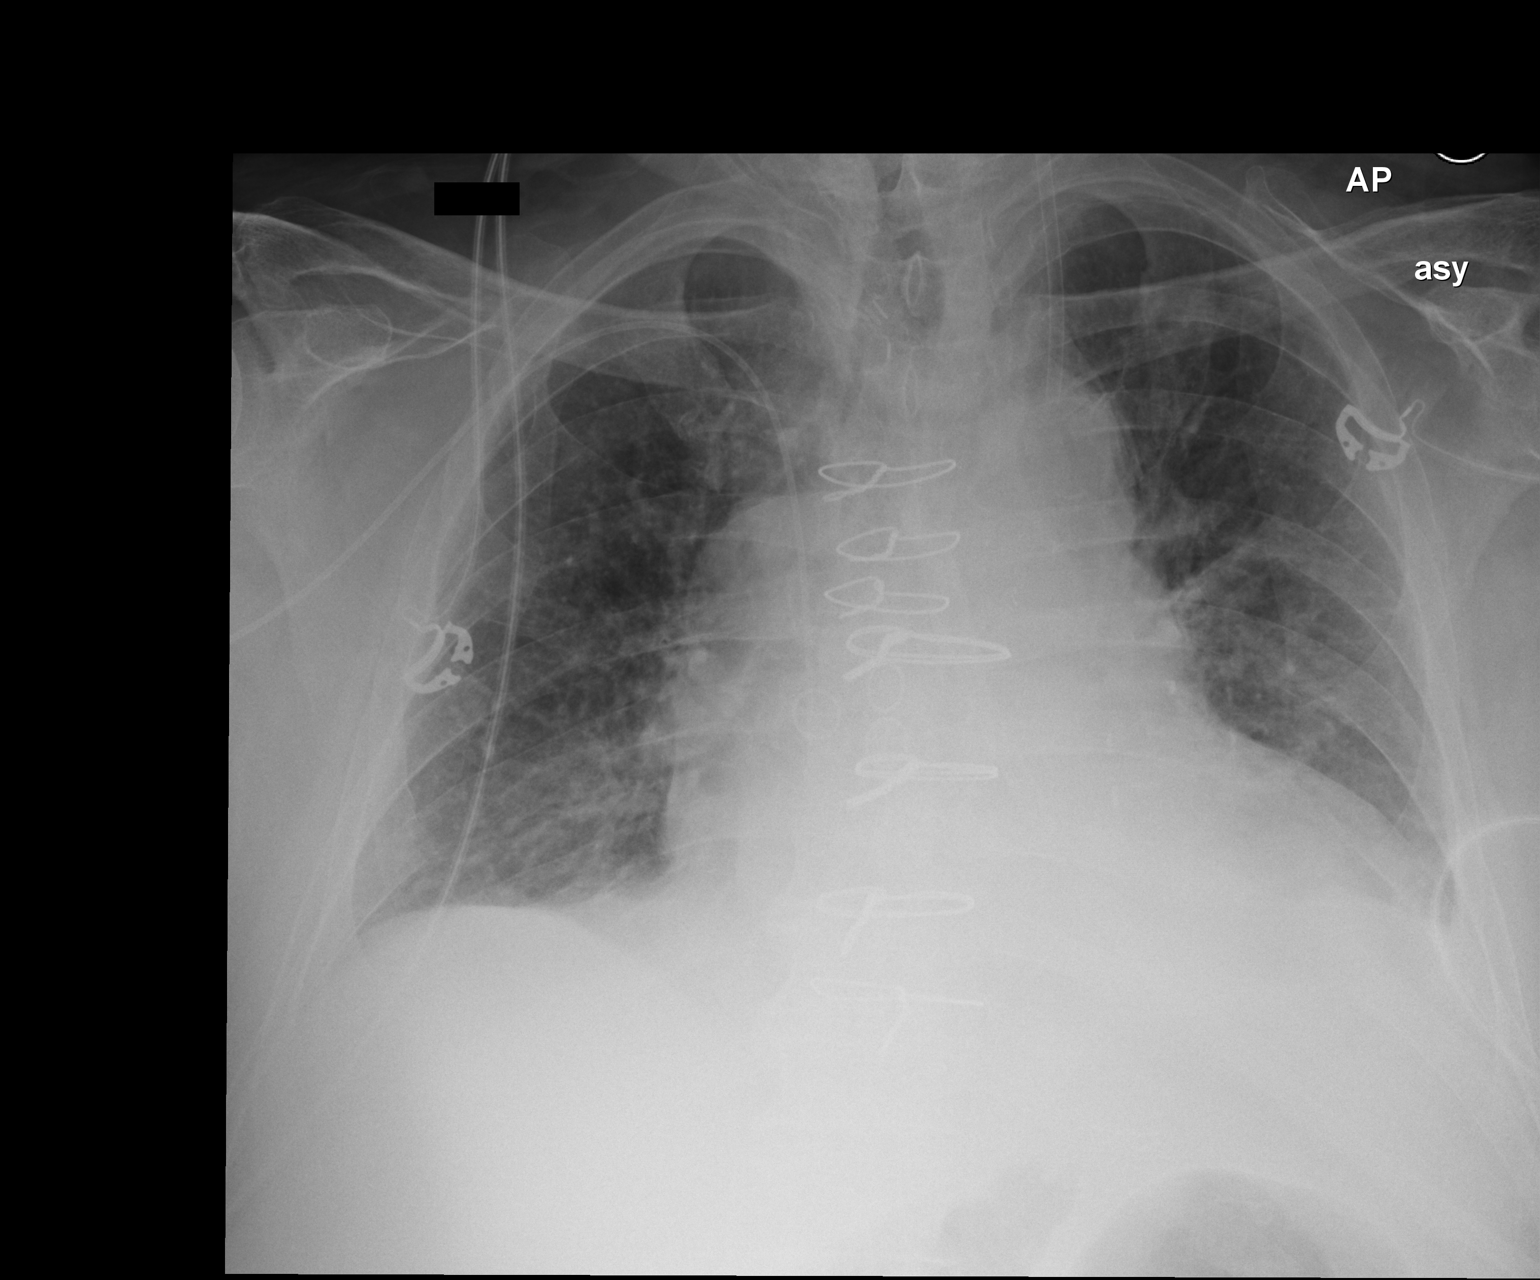

[1 of 1 positions shown; findings below may reference images not displayed]

FINDINGS: Portable AP semi upright view at 4894 hours. Right upper extremity
approach PICC line catheter placed. Tip a bowel 1 vertebral body
below the carina. Stable cardiac size and mediastinal contours.
Lower lung volumes. Otherwise stable ventilation. No pneumothorax.
IMPRESSION: Right PICC line placed, tip at the lower SVC level. Otherwise stable
chest.

## 2015-12-20 IMAGING — DX DG CHEST 2V
2 series · 2 of 2 positions shown · non-contrast
Comparison: 02/21/2014

CLINICAL DATA: Post CABG, hypertension, atrial fibrillation

EXAM:
CHEST  2 VIEW

[chest pa]
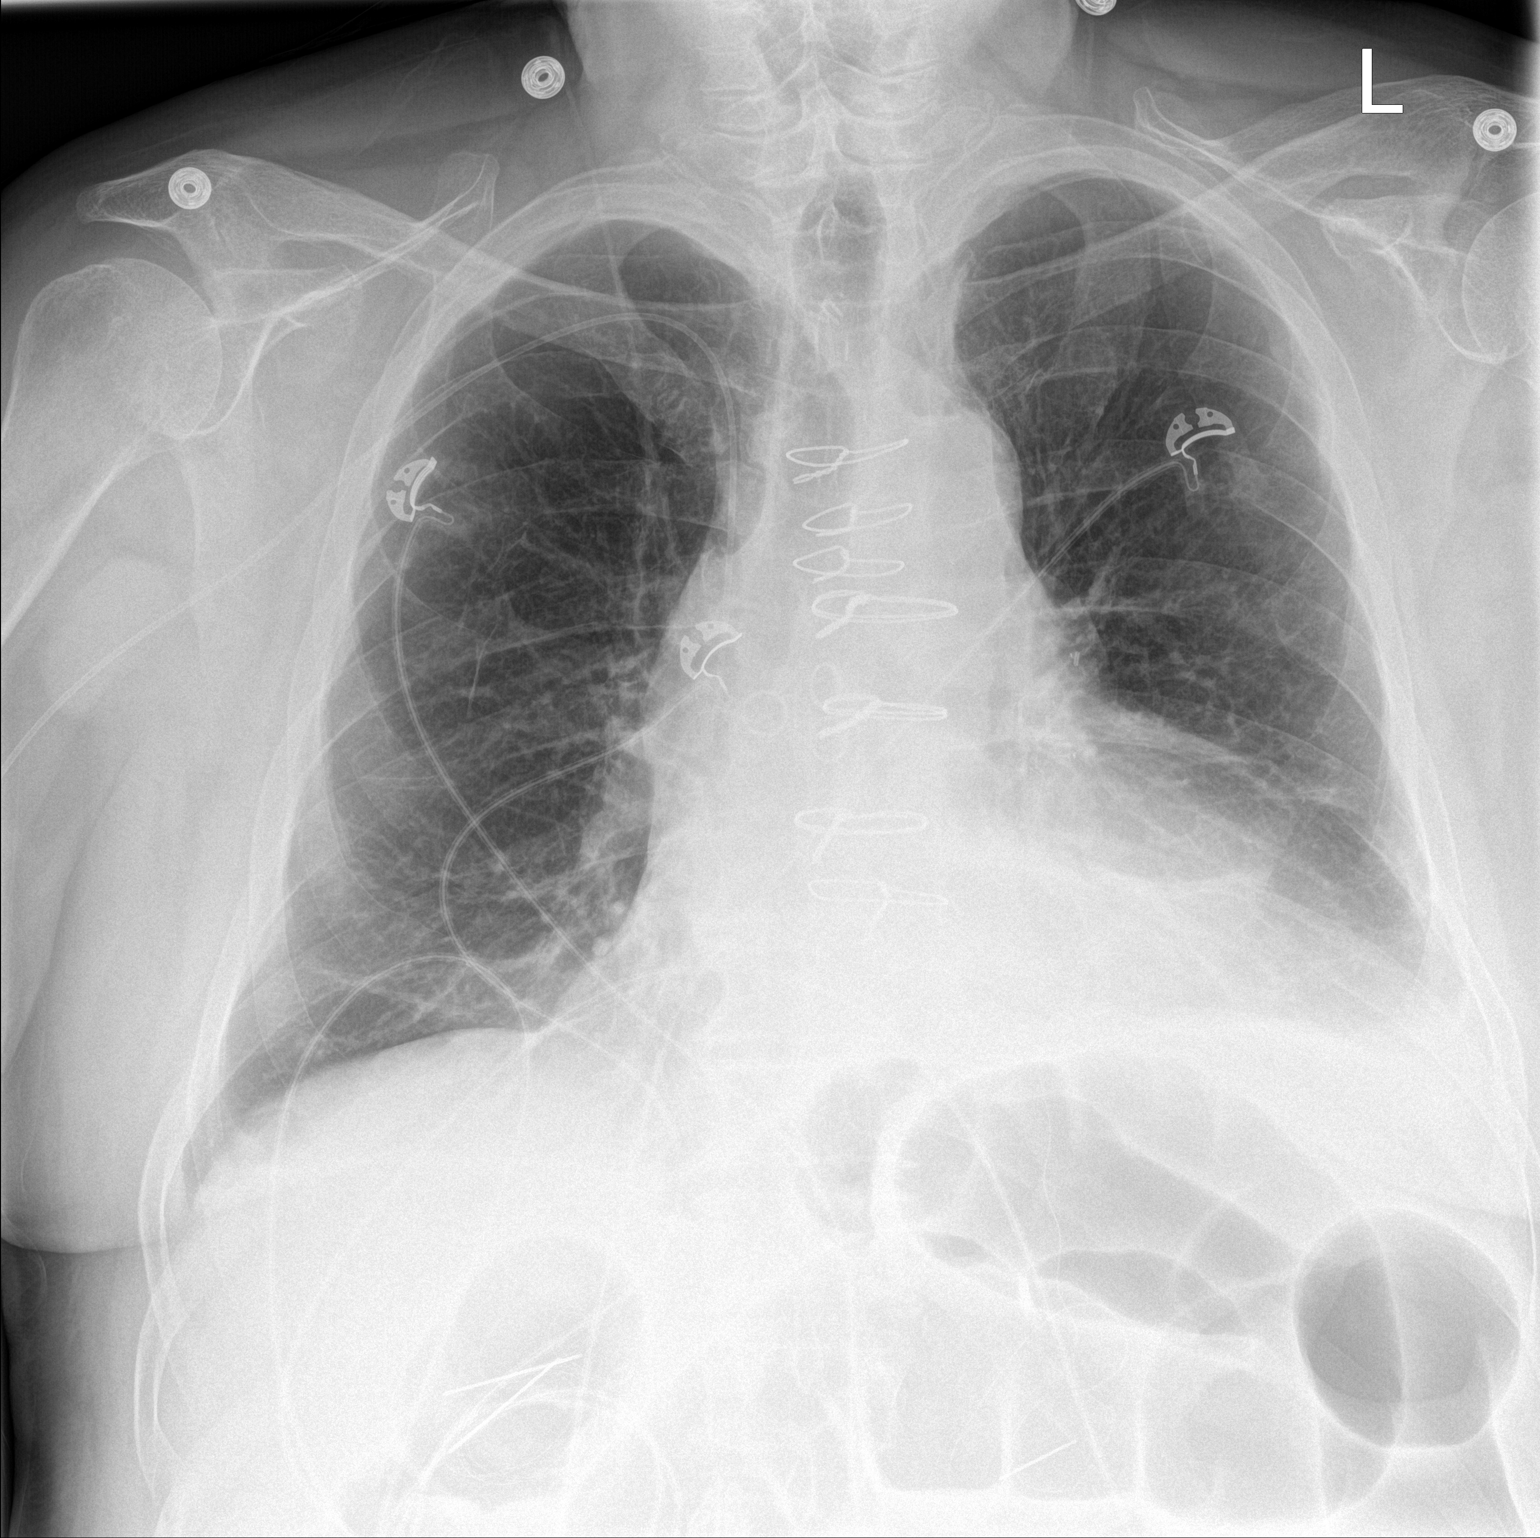

[chest lat]
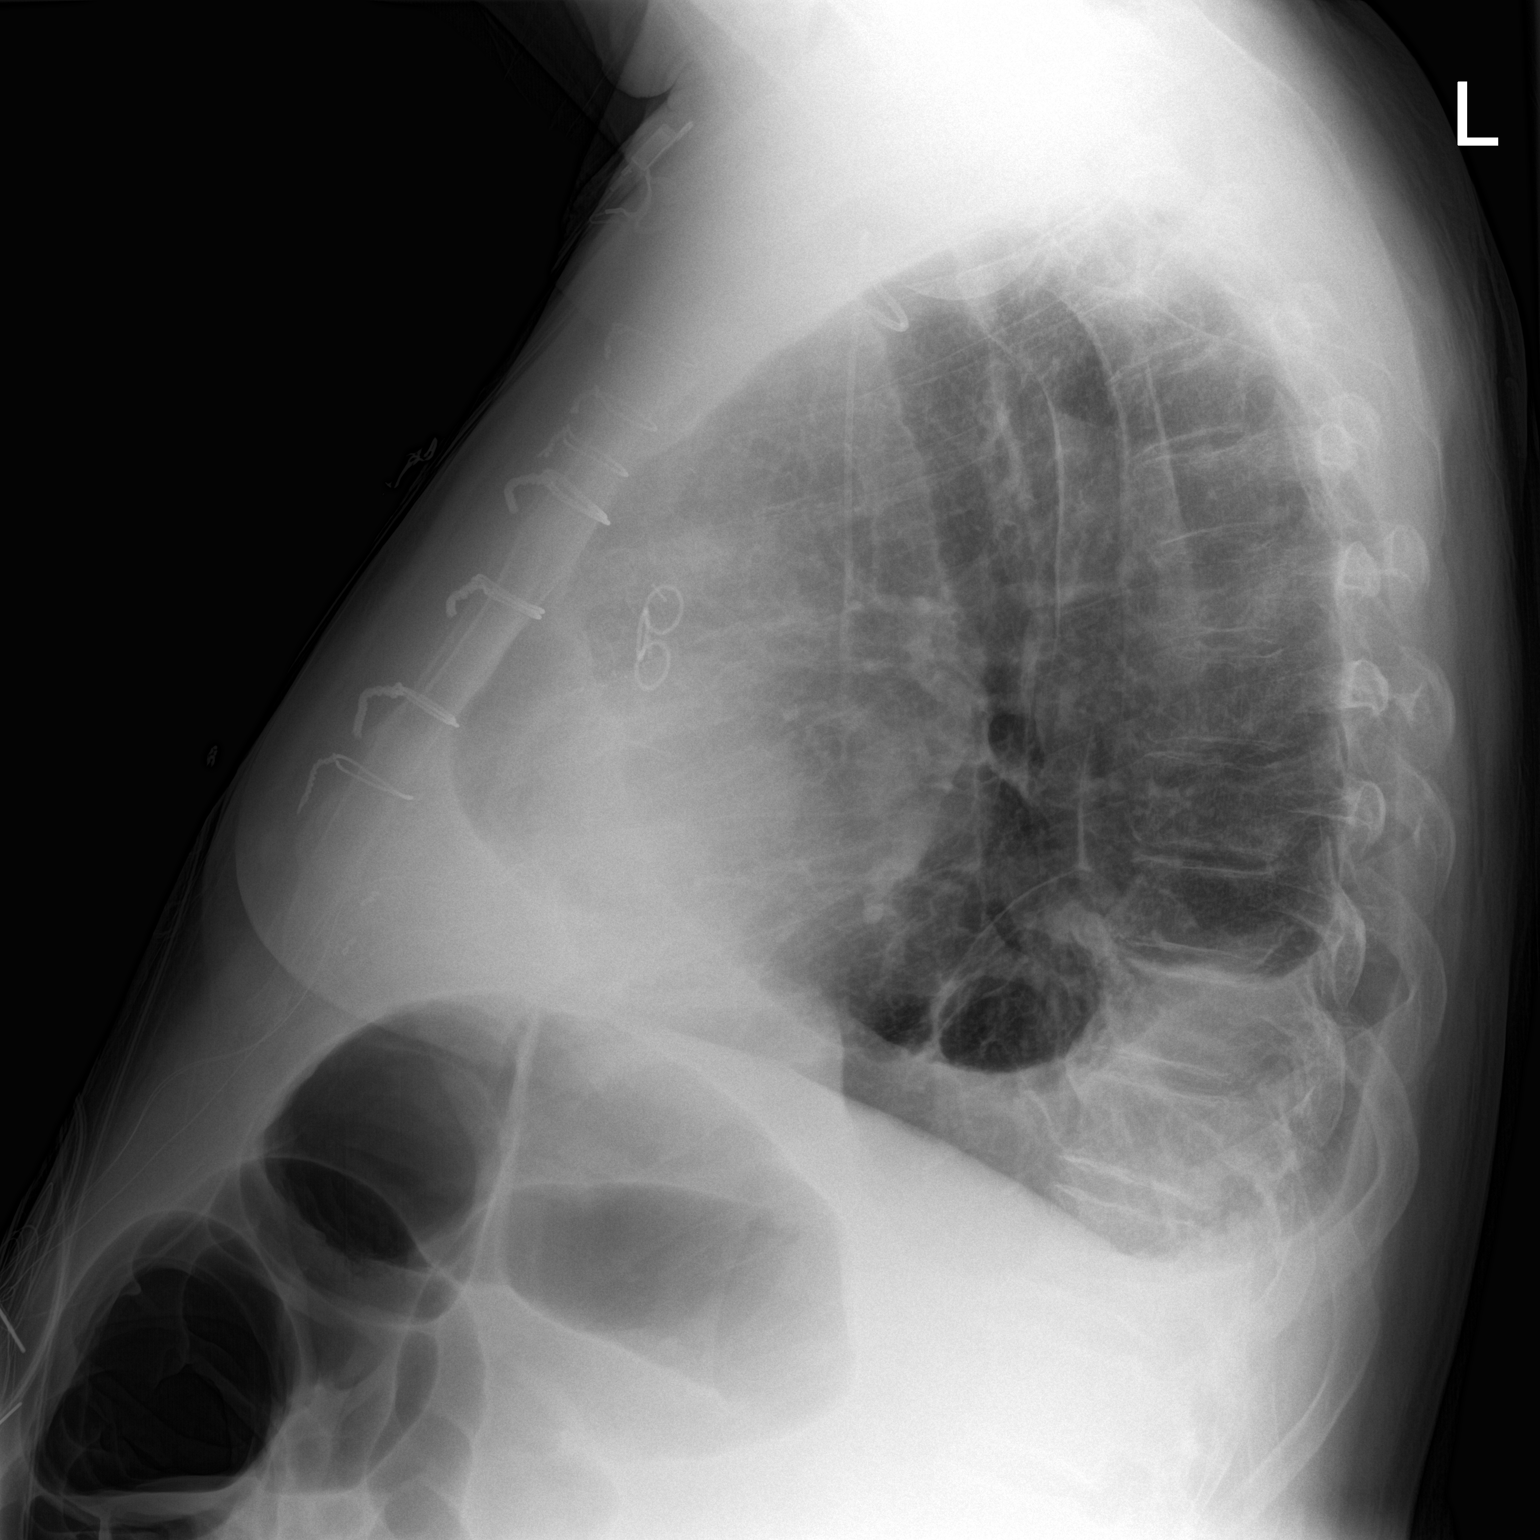

[2 of 2 positions shown; findings below may reference images not displayed]

FINDINGS: New RIGHT arm PICC line tip projects over SVC.

Enlargement of cardiac silhouette post CABG.

Mediastinal contours and pulmonary vascularity normal.

Emphysematous changes with bibasilar atelectasis and small effusions
greater on LEFT.

Remaining lungs clear.

No pleural effusion or pneumothorax.

Epicardial pacing wires again noted.
IMPRESSION: Mild enlargement of cardiac silhouette post CABG.

COPD changes with bibasilar atelectasis and small pleural effusions,
greater on LEFT.

## 2015-12-27 IMAGING — CR DG CHEST 2V
2 series · 2 of 2 positions shown · non-contrast
Comparison: 03/01/2014

CLINICAL DATA: Status OHS

EXAM:
CHEST  2 VIEW

[chest pa]
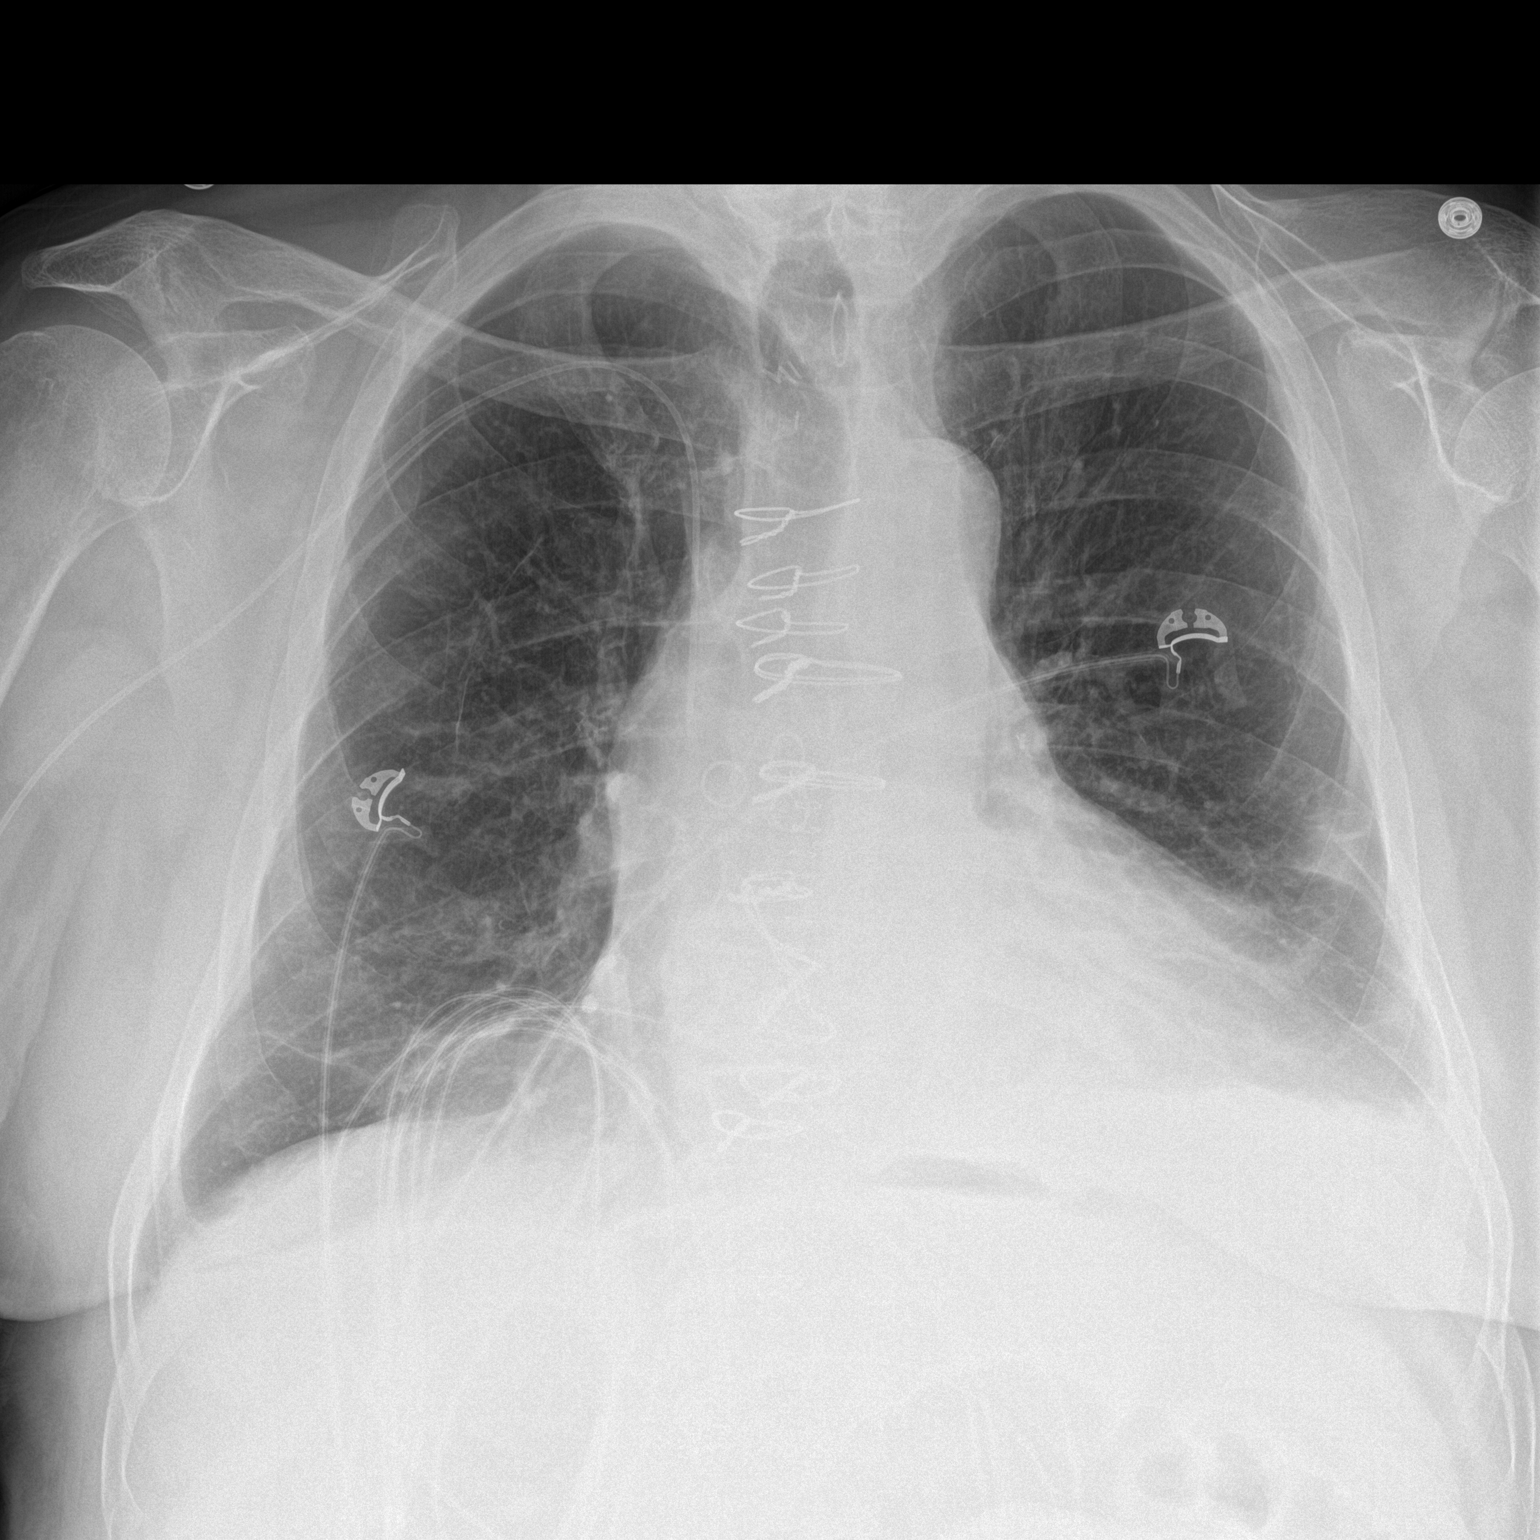

[chest lat]
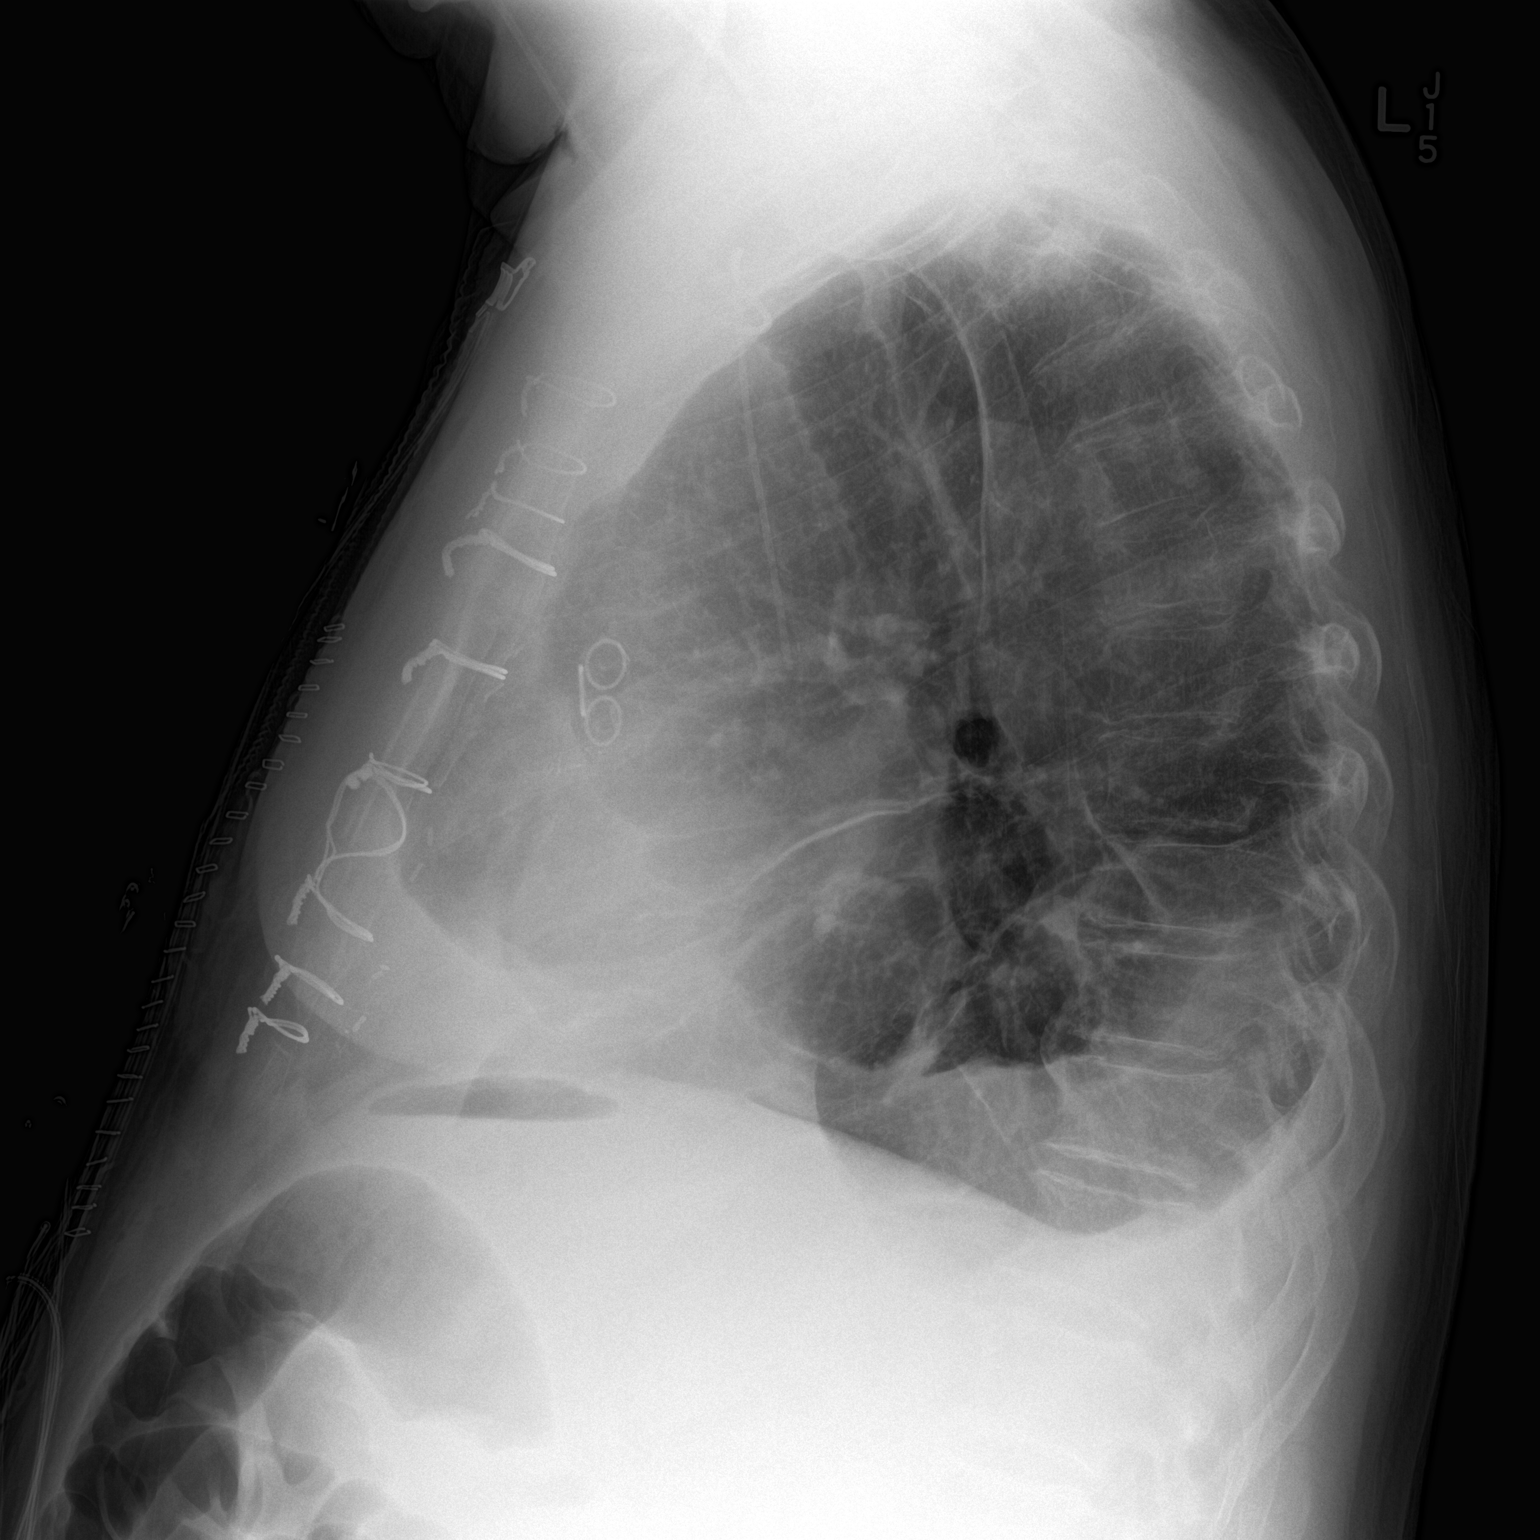

[2 of 2 positions shown; findings below may reference images not displayed]

FINDINGS: There is a right sided dialysis PICC line with tip in the cavoatrial
junction. The patient is status post median sternotomy and CABG
procedure. There are bilateral pleural effusions, left greater
night. Atelectasis is noted in the left base.
IMPRESSION: No change in bilateral pleural effusions and left base atelectasis.

## 2015-12-30 ENCOUNTER — Ambulatory Visit (INDEPENDENT_AMBULATORY_CARE_PROVIDER_SITE_OTHER): Payer: Medicare Other | Admitting: *Deleted

## 2015-12-30 DIAGNOSIS — I480 Paroxysmal atrial fibrillation: Secondary | ICD-10-CM | POA: Diagnosis not present

## 2015-12-30 DIAGNOSIS — Z5181 Encounter for therapeutic drug level monitoring: Secondary | ICD-10-CM

## 2015-12-30 LAB — POCT INR: INR: 3.2

## 2016-01-12 ENCOUNTER — Other Ambulatory Visit: Payer: Self-pay

## 2016-01-12 MED ORDER — CARVEDILOL 6.25 MG PO TABS: 6.2500 mg | ORAL_TABLET | Freq: Two times a day (BID) | ORAL | 0 refills | Status: DC

## 2016-01-12 NOTE — Telephone Encounter (Signed)
Pt over for fu with Dr Excell Seltzerooper, 30 day supply given Coreg,messaged pharmacy to have pt call for apt

## 2016-01-21 ENCOUNTER — Ambulatory Visit: Payer: Medicare Other | Admitting: Physician Assistant

## 2016-01-27 DIAGNOSIS — E7800 Pure hypercholesterolemia, unspecified: Secondary | ICD-10-CM | POA: Insufficient documentation

## 2016-01-27 DIAGNOSIS — I5042 Chronic combined systolic (congestive) and diastolic (congestive) heart failure: Secondary | ICD-10-CM | POA: Insufficient documentation

## 2016-01-27 NOTE — Progress Notes (Signed)
Cardiology Office Note:    Date:  01/28/2016   ID:  Gregory Davila, DOB 1940/01/08, MRN [id number]  PCP:  Deloria Lair, MD  Cardiologist:  Dr. Loralie Champagne   Electrophysiologist:  n/a  Referring MD: Deloria Lair., MD   Chief Complaint  Patient presents with  . Follow-up    CAD, AFib    History of Present Illness:    Gregory Davila is a 76 y.o. male with a hx of CAD s/p NSTEMI followed by CABG in 2/16, persistent AF, systolic HF 2/2 ischemic CM, HTN, CKD.  Last seen by Dr. Sherren Mocha in 12/16.   He returns for cardiology follow-up. He tells her that he went to the emergency room at [facility] a few months ago with recurrent atrial fibrillation. He was given diltiazem and converted to normal sinus rhythm. He has not had a recurrence since that time. He denies chest discomfort, shortness of breath, syncope, orthopnea, PND or edema.  Prior CV studies that were reviewed today include:    LHC 2/16 LM patent with irregularity but no significant stenosis. LAD prox 100, mid and dist fill by L-L collats, Dx fills via collats LCx mid 90 with ulcerated plaque RCA mid 80, dist 100 EF 45  Carotid US 2/16 Bilat ICA 1-39  Echo 2/16 Mod LVH, EF 40-45, septal HK, Gr 1 DD, mild AI, mild MAC, mild MR  Myoview 7/13 Ant-septal/periapical infarct with peri-infarct ischemia, EF 43  Past Medical History:  Diagnosis Date  . Anginal pain ([place])   . CAD (coronary artery disease)    a, 2011: occluded LAD with left to right collaterals and moderate disease in the right coronary artery and circumflex, EF 50%. b. NSTEMI 07/2011 in setting of AF-RVR, MV abnl, cath w/ LAD 100%, RCA mod-severe dz, small vessel, med rx  . Chronic renal insufficiency    followed by primary care physician  . Gout   . Hypertension    long-standing  . Myocardial infarction   . Paroxysmal atrial fibrillation ([place])    a. On Coumadin therapy/amidarone. b. Junctional bradycardia after conversion to NSR  07/2011 so BB reduced.  . Pulmonary embolism [facility])     Past Surgical History:  Procedure Laterality Date  . APPENDECTOMY    . CARDIAC CATHETERIZATION  2011 & 2013   2013 L main [place], LAD 100%, CFX 50%, RCA 80%, small vessel, med rx  . CORONARY ARTERY BYPASS GRAFT N/A 02/14/2014   Procedure: CORONARY ARTERY BYPASS GRAFTING (CABG)X4 LIMA-LAD; SVG-RCA(ENDARDERECTOMY); SVG-OM; SVG-DIAG;  Surgeon: Ivin Poot, MD;  Location: [place];  Service: Open Heart Surgery;  Laterality: N/A;  . LEFT HEART CATHETERIZATION WITH CORONARY ANGIOGRAM N/A 02/08/2014   Procedure: LEFT HEART CATHETERIZATION WITH CORONARY ANGIOGRAM;  Surgeon: Blane Ohara, MD;  Location[facility] C[facility] LAB;  Service: Cardiovascular;  Laterality: N/A;  . NM MYOVIEW LTD  2013   Scar in the anteroseptal/periapical distribution with moderate peri-infarct ischemia, EF 43%  . STERNAL INCISION RECLOSURE N/A 02/28/2014   Procedure: Irrigation and Debridement of Sternum with Sternal Rewiring;  Surgeon: Ivin Poot, MD;  Location: [place];  Service: Thoracic;  Laterality: N/A;  . TEE WITHOUT CARDIOVERSION N/A 02/14/2014   Procedure: TRANSESOPHAGEAL ECHOCARDIOGRAM (TEE);  Surgeon: Ivin Poot, MD;  Location: [place];  Service: Open Heart Surgery;  Laterality: N/A;  . TOTAL KNEE ARTHROPLASTY Bilateral     Current Medications: Current Meds  Medication Sig  . allopurinol (ZYLOPRIM) 300 MG tablet Take 0.5 tablets (150  mg total) by mouth daily.  [name] amiodarone (PACERONE) 200 MG tablet TAKE (1/2) TABLET BY MOUTH DAILY.  [name] atorvastatin (LIPITOR) 40 MG tablet Take 1 tablet (40 mg total) by mouth daily at 6 PM.  . cetirizine (ZYRTEC) 10 MG tablet Take 10 mg by mouth daily.  [name] COLCRYS 0.6 MG tablet Take 1 tablet by mouth 2 (two) times daily as needed (gout).   [name] diltiazem (CARDIZEM) 30 MG tablet Take 30 mg by mouth daily as needed (palpitations).  . fluticasone (FLONASE) 50 MCG/ACT nasal spray Place 1 spray into both nostrils daily as needed for allergies  or rhinitis.  . multivitamin (THERAGRAN) per tablet Take 1 tablet by mouth daily.    [name] warfarin (COUMADIN) 5 MG tablet TAKE 1 TABLET DAILY AS DIRECTED.     Allergies:   Patient has no known allergies.   Social History   Social History  . Marital status: Married    Spouse name: N/A  . Number of children: N/A  . Years of education: N/A   Occupational History  . Retired    Social History Main Topics  . Smoking status: Former Smoker    Types: Cigarettes    Quit date: 01/04/1966  . Smokeless tobacco: Former Systems developer    Types: [place] date: 01/05/2007     Comment: quit chewing tobacco about 4 yrs ago  . Alcohol use Yes     Comment: "seldomly drink beer" 1 every 2-3 weeks  . Drug use: No  . Sexual activity: Yes   Other Topics Concern  . None   Social History Narrative  . None     Family History:  The patient's family history includes Cancer in his mother; Coronary artery disease in his brother; Heart attack in his mother; Prostate cancer in his father.   ROS:   Please see the history of present illness.    ROS All other systems reviewed and are negative.   EKGs/Labs/Other Test Reviewed:    EKG:  EKG is  ordered today.  The ekg ordered today demonstrates NSR, HR 69, normal axis, anteroseptal Q waves, QTc 492 ms, frequent PVCs but no significant change when compared to prior tracing  Recent Labs: 04/01/2015: ALT 37; BUN 23; Creat 1.55; Potassium 4.7; Sodium 139   Recent Lipid Panel    Component Value Date/Time   CHOL 122 (L) 04/01/2015 0739   TRIG 108 04/01/2015 0739   HDL 37 (L) 04/01/2015 0739   CHOLHDL 3.3 04/01/2015 0739   VLDL 22 04/01/2015 0739   LDLCALC 63 04/01/2015 0739     Physical Exam:    VS:  BP 130/60   Pulse 69   Ht _0  (1.803 m)   Wt 226 lb 6.4 oz (102.7 kg)   BMI 31.58 kg/m     Wt Readings from Last 3 Encounters:  01/28/16 226 lb 6.4 oz (102.7 kg)  01/02/15 209 lb 9.6 oz (95.1 kg)  05/23/14 201 lb (91.2 kg)     Physical Exam    Constitutional: He is oriented to person, place, and time. He appears well-developed and well-nourished. No distress.  HENT:  Head: Normocephalic and atraumatic.  Eyes: No scleral icterus.  Neck: No JVD present.  Cardiovascular: Normal rate, regular rhythm and normal heart sounds.   No murmur heard. Pulmonary/Chest: Effort normal. He has no wheezes. He has no rales.  Abdominal: Soft. There is no tenderness.  Musculoskeletal: He exhibits no edema.  Neurological: He is alert and oriented to  person, place, and time.  Skin: Skin is warm and dry.  Psychiatric: He has a normal mood and affect.    ASSESSMENT:    1. Coronary artery disease involving native coronary artery of native heart without angina pectoris   2. Chronic combined systolic and diastolic CHF (congestive heart failure) (HCC)   3. Persistent atrial fibrillation ([place])   4. Essential hypertension   5. Pure hypercholesterolemia   6. CKD (chronic kidney disease) stage 3, GFR 30-59 ml/min    PLAN:    In order of problems listed above:  1. CAD - s/p prior NSTEMI and CABG in 2/16.  He is doing well without angina. He is not on aspirin as he is on Coumadin. Continue beta blocker, statin.  2. Chronic combined systolic and diastolic CHF - 2/2 to ischemic CM.  NYHA 1-2.  Volume is normal. He is not on diuretics. Continue beta blocker.  3. Persistent AF - Coumadin managed in our [place] office.  He had a recurrent episode of atrial fibrillation several months ago. He now carries when necessary diltiazem to take as needed. He remains on Amiodarone.  He is not certain if he snores. He was told greater than 10 years ago that he may have sleep apnea. I have asked him to have his wife take note of his sleeping habits. If he has witnessed apnea or loud snoring, we will need to proceed with sleep testing.  -  Obtain recent TSH from PCP  -  Check CMET, magnesium (frequent PVCs on ECG) today.  4. HTN - Blood pressure is controlled.  5. HL -  Managed by PCP.  LDL in 3/17 was 63.  Continue Lipitor 40.  6. CKD - Check CMET.     Medication Adjustments/Labs and Tests Ordered: Current medicines are reviewed at length with the patient today.  Concerns regarding medicines are outlined above.  Medication changes, Labs and Tests ordered today are outlined in the Patient Instructions noted below. Patient Instructions  HAPPY BIRTHDAY TOMORROW !!!!!!!  Medication Instructions:  A REFILL FOR COREG WAS SENT IN TODAY  Labwork: TODAY CMET, MAGNESIUM LEVEL  Testing/Procedures: NONE  Follow-Up: Your physician wants you to follow-up in: 6 MONTHS WITH DR. Emelda Fear will receive a reminder letter in the mail two months in advance. If you don't receive a letter, please call our office to schedule the follow-up appointment.  Any Other Special Instructions Will Be Listed Below (If Applicable).  If you need a refill on your cardiac medications before your next appointment, please call your pharmacy.  Signed, Richardson Dopp, PA-C  01/28/2016 9:14 AM    [facility] [place], [place], [place]  [address] Phone: [phone]; Fax: [phone]

## 2016-01-28 ENCOUNTER — Encounter: Payer: Self-pay | Admitting: Physician Assistant

## 2016-01-28 ENCOUNTER — Ambulatory Visit (INDEPENDENT_AMBULATORY_CARE_PROVIDER_SITE_OTHER): Payer: Medicare Other | Admitting: Physician Assistant

## 2016-01-28 VITALS — BP 130/60 | HR 69 | Ht 71.0 in | Wt 226.4 lb

## 2016-01-28 DIAGNOSIS — I481 Persistent atrial fibrillation: Secondary | ICD-10-CM

## 2016-01-28 DIAGNOSIS — I4819 Other persistent atrial fibrillation: Secondary | ICD-10-CM

## 2016-01-28 DIAGNOSIS — I5042 Chronic combined systolic (congestive) and diastolic (congestive) heart failure: Secondary | ICD-10-CM | POA: Diagnosis not present

## 2016-01-28 DIAGNOSIS — I10 Essential (primary) hypertension: Secondary | ICD-10-CM | POA: Diagnosis not present

## 2016-01-28 DIAGNOSIS — N183 Chronic kidney disease, stage 3 unspecified: Secondary | ICD-10-CM

## 2016-01-28 DIAGNOSIS — E7800 Pure hypercholesterolemia, unspecified: Secondary | ICD-10-CM

## 2016-01-28 DIAGNOSIS — I2510 Atherosclerotic heart disease of native coronary artery without angina pectoris: Secondary | ICD-10-CM | POA: Diagnosis not present

## 2016-01-28 MED ORDER — CARVEDILOL 6.25 MG PO TABS: 6.2500 mg | ORAL_TABLET | Freq: Two times a day (BID) | ORAL | 3 refills | Status: DC

## 2016-01-28 NOTE — Patient Instructions (Addendum)
HAPPY BIRTHDAY TOMORROW !!!!!!!  Medication Instructions:  A REFILL FOR COREG WAS SENT IN TODAY  Labwork: TODAY CMET, MAGNESIUM LEVEL  Testing/Procedures: NONE  Follow-Up: Your physician wants you to follow-up in: 6 MONTHS WITH DR. Theodoro ParmaOOPER You will receive a reminder letter in the mail two months in advance. If you don't receive a letter, please call our office to schedule the follow-up appointment.  Any Other Special Instructions Will Be Listed Below (If Applicable).  If you need a refill on your cardiac medications before your next appointment, please call your pharmacy.

## 2016-01-29 ENCOUNTER — Telehealth: Payer: Self-pay | Admitting: *Deleted

## 2016-01-29 LAB — COMPREHENSIVE METABOLIC PANEL
ALT: 51 IU/L — ABNORMAL HIGH (ref 0–44)
AST: 50 IU/L — ABNORMAL HIGH (ref 0–40)
Albumin/Globulin Ratio: 1.1 — ABNORMAL LOW (ref 1.2–2.2)
Albumin: 3.9 g/dL (ref 3.5–4.8)
Alkaline Phosphatase: 92 IU/L (ref 39–117)
BUN/Creatinine Ratio: 12 (ref 10–24)
BUN: 20 mg/dL (ref 8–27)
Bilirubin Total: 0.5 mg/dL (ref 0.0–1.2)
CO2: 24 mmol/L (ref 18–29)
Calcium: 9.1 mg/dL (ref 8.6–10.2)
Chloride: 102 mmol/L (ref 96–106)
Creatinine, Ser: 1.7 mg/dL — ABNORMAL HIGH (ref 0.76–1.27)
GFR calc Af Amer: 45 mL/min/{1.73_m2} — ABNORMAL LOW (ref >59)
GFR calc non Af Amer: 39 mL/min/{1.73_m2} — ABNORMAL LOW (ref >59)
Globulin, Total: 3.5 g/dL (ref 1.5–4.5)
Glucose: 107 mg/dL — ABNORMAL HIGH (ref 65–99)
Potassium: 4.6 mmol/L (ref 3.5–5.2)
Sodium: 140 mmol/L (ref 134–144)
Total Protein: 7.4 g/dL (ref 6.0–8.5)

## 2016-01-29 LAB — MAGNESIUM: Magnesium: 1.9 mg/dL (ref 1.6–2.3)

## 2016-01-29 NOTE — Telephone Encounter (Signed)
Pt notified of lab results by phone with verbal understanding.  

## 2016-02-10 ENCOUNTER — Ambulatory Visit (INDEPENDENT_AMBULATORY_CARE_PROVIDER_SITE_OTHER): Payer: Medicare Other | Admitting: *Deleted

## 2016-02-10 DIAGNOSIS — I480 Paroxysmal atrial fibrillation: Secondary | ICD-10-CM | POA: Diagnosis not present

## 2016-02-10 DIAGNOSIS — Z5181 Encounter for therapeutic drug level monitoring: Secondary | ICD-10-CM

## 2016-02-10 DIAGNOSIS — I2510 Atherosclerotic heart disease of native coronary artery without angina pectoris: Secondary | ICD-10-CM

## 2016-02-10 LAB — POCT INR: INR: 3.1

## 2016-02-20 ENCOUNTER — Other Ambulatory Visit: Payer: Self-pay | Admitting: Cardiovascular Disease

## 2016-03-18 ENCOUNTER — Ambulatory Visit (INDEPENDENT_AMBULATORY_CARE_PROVIDER_SITE_OTHER): Payer: Medicare Other | Admitting: *Deleted

## 2016-03-18 DIAGNOSIS — I480 Paroxysmal atrial fibrillation: Secondary | ICD-10-CM | POA: Diagnosis not present

## 2016-03-18 DIAGNOSIS — Z5181 Encounter for therapeutic drug level monitoring: Secondary | ICD-10-CM | POA: Diagnosis not present

## 2016-03-18 DIAGNOSIS — I2510 Atherosclerotic heart disease of native coronary artery without angina pectoris: Secondary | ICD-10-CM

## 2016-03-18 LAB — POCT INR: INR: 2.8

## 2016-04-20 ENCOUNTER — Ambulatory Visit (INDEPENDENT_AMBULATORY_CARE_PROVIDER_SITE_OTHER): Payer: Medicare Other | Admitting: *Deleted

## 2016-04-20 DIAGNOSIS — Z5181 Encounter for therapeutic drug level monitoring: Secondary | ICD-10-CM

## 2016-04-20 DIAGNOSIS — I2510 Atherosclerotic heart disease of native coronary artery without angina pectoris: Secondary | ICD-10-CM

## 2016-04-20 DIAGNOSIS — I480 Paroxysmal atrial fibrillation: Secondary | ICD-10-CM

## 2016-04-20 LAB — POCT INR: INR: 2.9

## 2016-05-11 ENCOUNTER — Other Ambulatory Visit: Payer: Self-pay | Admitting: Cardiovascular Disease

## 2016-06-01 ENCOUNTER — Ambulatory Visit (INDEPENDENT_AMBULATORY_CARE_PROVIDER_SITE_OTHER): Payer: Medicare Other | Admitting: *Deleted

## 2016-06-01 DIAGNOSIS — I480 Paroxysmal atrial fibrillation: Secondary | ICD-10-CM | POA: Diagnosis not present

## 2016-06-01 DIAGNOSIS — I2510 Atherosclerotic heart disease of native coronary artery without angina pectoris: Secondary | ICD-10-CM | POA: Diagnosis not present

## 2016-06-01 DIAGNOSIS — Z5181 Encounter for therapeutic drug level monitoring: Secondary | ICD-10-CM

## 2016-06-01 LAB — POCT INR: INR: 3

## 2016-07-13 ENCOUNTER — Ambulatory Visit (INDEPENDENT_AMBULATORY_CARE_PROVIDER_SITE_OTHER): Payer: Medicare Other | Admitting: *Deleted

## 2016-07-13 DIAGNOSIS — I2510 Atherosclerotic heart disease of native coronary artery without angina pectoris: Secondary | ICD-10-CM

## 2016-07-13 DIAGNOSIS — Z5181 Encounter for therapeutic drug level monitoring: Secondary | ICD-10-CM

## 2016-07-13 DIAGNOSIS — I480 Paroxysmal atrial fibrillation: Secondary | ICD-10-CM

## 2016-07-13 LAB — POCT INR: INR: 3.2

## 2016-07-22 ENCOUNTER — Other Ambulatory Visit: Payer: Self-pay | Admitting: Cardiovascular Disease

## 2016-08-10 ENCOUNTER — Ambulatory Visit (INDEPENDENT_AMBULATORY_CARE_PROVIDER_SITE_OTHER): Payer: Medicare Other | Admitting: *Deleted

## 2016-08-10 DIAGNOSIS — Z5181 Encounter for therapeutic drug level monitoring: Secondary | ICD-10-CM

## 2016-08-10 DIAGNOSIS — I2510 Atherosclerotic heart disease of native coronary artery without angina pectoris: Secondary | ICD-10-CM

## 2016-08-10 DIAGNOSIS — I480 Paroxysmal atrial fibrillation: Secondary | ICD-10-CM | POA: Diagnosis not present

## 2016-08-10 LAB — POCT INR: INR: 2.7

## 2016-09-14 ENCOUNTER — Ambulatory Visit (INDEPENDENT_AMBULATORY_CARE_PROVIDER_SITE_OTHER): Payer: Medicare Other | Admitting: *Deleted

## 2016-09-14 DIAGNOSIS — I4891 Unspecified atrial fibrillation: Secondary | ICD-10-CM

## 2016-09-14 DIAGNOSIS — I480 Paroxysmal atrial fibrillation: Secondary | ICD-10-CM | POA: Diagnosis not present

## 2016-09-14 DIAGNOSIS — Z5181 Encounter for therapeutic drug level monitoring: Secondary | ICD-10-CM | POA: Diagnosis not present

## 2016-09-14 LAB — POCT INR: INR: 2.8

## 2016-10-26 ENCOUNTER — Ambulatory Visit (INDEPENDENT_AMBULATORY_CARE_PROVIDER_SITE_OTHER): Payer: Medicare Other | Admitting: *Deleted

## 2016-10-26 DIAGNOSIS — I480 Paroxysmal atrial fibrillation: Secondary | ICD-10-CM | POA: Diagnosis not present

## 2016-10-26 DIAGNOSIS — Z5181 Encounter for therapeutic drug level monitoring: Secondary | ICD-10-CM

## 2016-10-26 DIAGNOSIS — I4891 Unspecified atrial fibrillation: Secondary | ICD-10-CM

## 2016-10-26 LAB — POCT INR: INR: 3

## 2016-11-03 ENCOUNTER — Other Ambulatory Visit: Payer: Self-pay | Admitting: Physician Assistant

## 2016-11-03 DIAGNOSIS — I4819 Other persistent atrial fibrillation: Secondary | ICD-10-CM

## 2016-11-03 DIAGNOSIS — I10 Essential (primary) hypertension: Secondary | ICD-10-CM

## 2016-11-18 ENCOUNTER — Other Ambulatory Visit: Payer: Self-pay | Admitting: Cardiovascular Disease

## 2016-12-07 ENCOUNTER — Ambulatory Visit (INDEPENDENT_AMBULATORY_CARE_PROVIDER_SITE_OTHER): Payer: Medicare Other | Admitting: *Deleted

## 2016-12-07 DIAGNOSIS — I480 Paroxysmal atrial fibrillation: Secondary | ICD-10-CM

## 2016-12-07 DIAGNOSIS — Z5181 Encounter for therapeutic drug level monitoring: Secondary | ICD-10-CM

## 2016-12-07 LAB — POCT INR: INR: 3.2

## 2017-01-06 ENCOUNTER — Ambulatory Visit (INDEPENDENT_AMBULATORY_CARE_PROVIDER_SITE_OTHER): Payer: Medicare Other | Admitting: *Deleted

## 2017-01-06 DIAGNOSIS — I480 Paroxysmal atrial fibrillation: Secondary | ICD-10-CM | POA: Diagnosis not present

## 2017-01-06 DIAGNOSIS — Z5181 Encounter for therapeutic drug level monitoring: Secondary | ICD-10-CM

## 2017-01-06 LAB — POCT INR: INR: 1.5

## 2017-01-06 NOTE — Patient Instructions (Signed)
Take coumadin 1 1/2 tablets tonight and tomorrow night then resume 1 tablet daily Continue same amt of greens Recheck in 2 weeks

## 2017-01-20 ENCOUNTER — Ambulatory Visit (INDEPENDENT_AMBULATORY_CARE_PROVIDER_SITE_OTHER): Payer: Medicare Other | Admitting: *Deleted

## 2017-01-20 DIAGNOSIS — I4891 Unspecified atrial fibrillation: Secondary | ICD-10-CM

## 2017-01-20 DIAGNOSIS — I480 Paroxysmal atrial fibrillation: Secondary | ICD-10-CM

## 2017-01-20 DIAGNOSIS — Z5181 Encounter for therapeutic drug level monitoring: Secondary | ICD-10-CM

## 2017-01-20 LAB — POCT INR: INR: 2.6

## 2017-01-20 NOTE — Patient Instructions (Signed)
Continue coumadin 1 tablet daily Continue same amt of greens Recheck in 4 weeks 

## 2017-01-21 ENCOUNTER — Other Ambulatory Visit: Payer: Self-pay | Admitting: Cardiovascular Disease

## 2017-02-16 DIAGNOSIS — I10 Essential (primary) hypertension: Secondary | ICD-10-CM | POA: Insufficient documentation

## 2017-02-16 DIAGNOSIS — Z86711 Personal history of pulmonary embolism: Secondary | ICD-10-CM | POA: Insufficient documentation

## 2017-02-16 DIAGNOSIS — M109 Gout, unspecified: Secondary | ICD-10-CM | POA: Insufficient documentation

## 2017-02-17 ENCOUNTER — Ambulatory Visit (INDEPENDENT_AMBULATORY_CARE_PROVIDER_SITE_OTHER): Payer: Medicare Other | Admitting: *Deleted

## 2017-02-17 DIAGNOSIS — I480 Paroxysmal atrial fibrillation: Secondary | ICD-10-CM

## 2017-02-17 DIAGNOSIS — I4891 Unspecified atrial fibrillation: Secondary | ICD-10-CM | POA: Diagnosis not present

## 2017-02-17 DIAGNOSIS — Z5181 Encounter for therapeutic drug level monitoring: Secondary | ICD-10-CM

## 2017-02-17 LAB — POCT INR: INR: 2.8

## 2017-02-17 NOTE — Patient Instructions (Signed)
Continue coumadin 1 tablet daily Continue same amt of greens Recheck in 4 weeks

## 2017-03-03 ENCOUNTER — Other Ambulatory Visit: Payer: Self-pay | Admitting: Cardiovascular Disease

## 2017-03-17 ENCOUNTER — Ambulatory Visit (INDEPENDENT_AMBULATORY_CARE_PROVIDER_SITE_OTHER): Payer: Medicare Other | Admitting: *Deleted

## 2017-03-17 DIAGNOSIS — I480 Paroxysmal atrial fibrillation: Secondary | ICD-10-CM

## 2017-03-17 DIAGNOSIS — Z5181 Encounter for therapeutic drug level monitoring: Secondary | ICD-10-CM

## 2017-03-17 DIAGNOSIS — I4891 Unspecified atrial fibrillation: Secondary | ICD-10-CM

## 2017-03-17 LAB — POCT INR: INR: 2.7

## 2017-03-17 NOTE — Patient Instructions (Signed)
Continue coumadin 1 tablet daily Continue same amt of greens Recheck in 6 weeks 

## 2017-03-24 ENCOUNTER — Encounter: Payer: Self-pay | Admitting: Cardiovascular Disease

## 2017-03-24 ENCOUNTER — Ambulatory Visit (INDEPENDENT_AMBULATORY_CARE_PROVIDER_SITE_OTHER): Payer: Medicare Other | Admitting: Cardiovascular Disease

## 2017-03-24 ENCOUNTER — Ambulatory Visit
Admission: RE | Admit: 2017-03-24 | Discharge: 2017-03-24 | Disposition: A | Discharge status: Home / Self Care | Payer: Medicare Other | Source: Ambulatory Visit | Attending: Cardiovascular Disease | Admitting: Cardiovascular Disease

## 2017-03-24 VITALS — BP 114/76 | HR 57 | Ht 70.0 in | Wt 207.6 lb

## 2017-03-24 DIAGNOSIS — I2510 Atherosclerotic heart disease of native coronary artery without angina pectoris: Secondary | ICD-10-CM | POA: Diagnosis not present

## 2017-03-24 DIAGNOSIS — I4819 Other persistent atrial fibrillation: Secondary | ICD-10-CM

## 2017-03-24 DIAGNOSIS — I481 Persistent atrial fibrillation: Secondary | ICD-10-CM | POA: Diagnosis not present

## 2017-03-24 MED ORDER — DILTIAZEM HCL 30 MG PO TABS: 30.0000 mg | ORAL_TABLET | Freq: Every day | ORAL | 10 refills | Status: DC | PRN

## 2017-03-24 NOTE — Progress Notes (Signed)
Cardiology Office Note Date:  03/24/2017   ID:  Gregory Davila, DOB Jun 06, 1940, MRN [id number]  PCP:  [name]., MD  Cardiologist:  Tonny Bollman, MD    Chief Complaint  Patient presents with  . Follow-up    CAD     History of Present Illness: Gregory Davila is a 77 y.o. male who presents for follow-up evaluation.  The patient has been followed for coronary artery disease.  He underwent multivessel CABG in 2016 after presenting with non-STEMI.  He is also had persistent atrial fibrillation, chronic systolic heart failure, hypertension, and chronic kidney disease.  He is here alone today. Trying to get back in the habit of regular walking. He's lost some weight and has been walking up to 3 miles several days per week. Denies shortness of breath, states he 'just gets tired.'  No leg swelling, orthopnea, or PND.  He had his annual eye exam last Fall.  He has not had any recent lab work and he is fasting today.  He has not had a chest x-ray in the past year.  Past Medical History:  Diagnosis Date  . Anginal pain (HCC)   . CAD (coronary artery disease)    a, 2011: occluded LAD with left to right collaterals and moderate disease in the right coronary artery and circumflex, EF 50%. b. NSTEMI 07/2011 in setting of AF-RVR, MV abnl, cath w/ LAD 100%, RCA mod-severe dz, small vessel, med rx  . Chronic renal insufficiency    followed by primary care physician  . Gout   . Hypertension    long-standing  . Myocardial infarction (HCC)   . Paroxysmal atrial fibrillation (HCC)    a. On Coumadin therapy/amidarone. b. 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  . Pulmonary embolism [facility])     Past Surgical History:  Procedure Laterality Date  . APPENDECTOMY    . CARDIAC CATHETERIZATION  2011 & 2013   2013 L main [place], LAD 100%, CFX 50%, RCA 80%, small vessel, med rx  . CORONARY ARTERY BYPASS GRAFT N/A 02/14/2014   Procedure: CORONARY ARTERY BYPASS GRAFTING (CABG)X4  LIMA-LAD; SVG-RCA(ENDARDERECTOMY); SVG-OM; SVG-DIAG;  Surgeon: Kerin Perna, MD;  Location: [facility] OR;  Service: Open Heart Surgery;  Laterality: N/A;  . LEFT HEART CATHETERIZATION WITH CORONARY ANGIOGRAM N/A 02/08/2014   Procedure: LEFT HEART CATHETERIZATION WITH CORONARY ANGIOGRAM;  Surgeon: Micheline Chapman, MD;  Location: [facility] CATH LAB;  Service: Cardiovascular;  Laterality: N/A;  . NM MYOVIEW LTD  2013   Scar in the anteroseptal/periapical distribution with moderate peri-infarct ischemia, EF 43%  . STERNAL INCISION RECLOSURE N/A 02/28/2014   Procedure: Irrigation and Debridement of Sternum with Sternal Rewiring;  Surgeon: Kerin Perna, MD;  Location: [facility] OR;  Service: Thoracic;  Laterality: N/A;  . TEE WITHOUT CARDIOVERSION N/A 02/14/2014   Procedure: TRANSESOPHAGEAL ECHOCARDIOGRAM (TEE);  Surgeon: Kerin Perna, MD;  Location: [facility] OR;  Service: Open Heart Surgery;  Laterality: N/A;  . TOTAL KNEE ARTHROPLASTY Bilateral     Current Outpatient Medications  Medication Sig Dispense Refill  . allopurinol (ZYLOPRIM) 300 MG tablet Take 0.5 tablets (150 mg total) by mouth daily. 30 tablet 1  . amiodarone (PACERONE) 200 MG tablet TAKE 1/2 TABLET BY MOUTH DAILY. 45 tablet 0  . aspirin EC 81 MG tablet Take 81 mg by mouth daily.    [name] atorvastatin (LIPITOR) 40 MG tablet Take 1 tablet (40 mg total) by mouth daily at 6 PM. 30 tablet  1  . carvedilol (COREG) 6.25 MG tablet Take 1 tablet (6.25 mg total) by mouth 2 (two) times daily with a meal. 180 tablet 0  . cetirizine (ZYRTEC) 10 MG tablet Take 10 mg by mouth daily.    . chlorthalidone (HYGROTON) 25 MG tablet Take 0.5 tablets by mouth daily.    [name]. COLCRYS 0.6 MG tablet Take 1 tablet by mouth 2 (two) times daily as needed (gout).     [name]. diltiazem (CARDIZEM) 30 MG tablet Take 30 mg by mouth daily as needed (palpitations).    . fluticasone (FLONASE) 50 MCG/ACT nasal spray Place 1 spray into both nostrils daily as needed for allergies or rhinitis.    .  multivitamin (THERAGRAN) per tablet Take 1 tablet by mouth daily.      . Saw Palmetto, Serenoa repens, (SAW PALMETTO PO) Take 2 capsules by mouth daily.    [name]. warfarin (COUMADIN) 5 MG tablet TAKE ONE TABLET DAILY AS DIRECTED 30 tablet 6  . nitroGLYCERIN (NITROSTAT) 0.4 MG SL tablet Place 1 tablet (0.4 mg total) under the tongue every 5 (five) minutes x 3 doses as needed for chest pain. 25 tablet 2   No current facility-administered medications for this visit.     Allergies:   Azithromycin and Oxycodone   Social History:  The patient  reports that he quit smoking about 51 years ago. His smoking use included cigarettes. He quit smokeless tobacco use about 10 years ago. His smokeless tobacco use included chew. He reports that he drinks alcohol. He reports that he does not use drugs.   Family History:  The patient's family history includes Cancer in his mother; Coronary artery disease in his brother; Heart attack in his mother; Prostate cancer in his father.    ROS:  Please see the history of present illness.  All other systems are reviewed and negative.    PHYSICAL EXAM: VS:  BP 114/76   Pulse (!) 57   Ht 5\' 10"  (1.778 m)   Wt 207 lb 9.6 oz (94.2 kg)   BMI 29.79 kg/m  , BMI Body mass index is 29.79 kg/m. GEN: Well nourished, well developed, in no acute distress  HEENT: normal  Neck: no JVD, no masses. No carotid bruits Cardiac: RRR without murmur or gallop                Respiratory:  clear to auscultation bilaterally, normal work of breathing GI: soft, nontender, nondistended, + BS MS: no deformity or atrophy  Ext: no pretibial edema, pedal pulses 2+= bilaterally Skin: warm and dry, no rash Neuro:  Strength and sensation are intact Psych: euthymic mood, full affect  EKG:  EKG is ordered today. The ekg ordered today shows sinus bradycardia 57 bpm, occasional PVC, age-indeterminate anteroseptal infarct.  Recent Labs: No results found for requested labs within last 8760 hours.    Lipid Panel     Component Value Date/Time   CHOL 122 (L) 04/01/2015 0739   TRIG 108 04/01/2015 0739   HDL 37 (L) 04/01/2015 0739   CHOLHDL 3.3 04/01/2015 0739   VLDL 22 04/01/2015 0739   LDLCALC 63 04/01/2015 0739      Wt Readings from Last 3 Encounters:  03/24/17 207 lb 9.6 oz (94.2 kg)  01/28/16 226 lb 6.4 oz (102.7 kg)  01/02/15 209 lb 9.6 oz (95.1 kg)     Cardiac Studies Reviewed: 2D echocardiogram February 09, 2014: Study Conclusions  - Left ventricle: The cavity size was normal. Wall thickness was increased  in a pattern of moderate LVH. Systolic function was mildly to moderately reduced. The estimated ejection fraction was in the range of 40% to 45%. Septal hypokinesis. Doppler parameters are consistent with abnormal left ventricular relaxation (grade 1 diastolic dysfunction). - Aortic valve: There was no stenosis. There was mild regurgitation. - Mitral valve: Mildly calcified annulus. Mildly calcified leaflets . There was mild regurgitation. - Right ventricle: The cavity size was normal. Systolic function was normal. - Pulmonary arteries: No complete TR doppler jet so unable to estimate PA systolic pressure. - Systemic veins: IVC measured 2.5 cm with no significant respirophasic variation, estimate RA pressure 15 mmHg.  Impressions:  - Normal LV size with moderate LV hypertrophy. EF 40-45% with septal hypokinesis. Normal RV size and systolic function. Mild AI, mild MR.  ASSESSMENT AND PLAN: 1.  Coronary artery disease, native vessel, without angina: The patient has done very well since his bypass surgery in 2016.  He continues on warfarin, beta-blocker, and a statin drug.  2.  Chronic combined systolic and diastolic heart failure: Patient with underlying ischemic cardiomyopathy, LVEF 40 to 45% at the time of last assessment.  No evidence of volume overload on physical exam.  He has New York Heart Association functional class I symptoms  at present.  He continues on carvedilol.  Blood pressure has been low and unable to take an ACE/ARB.  3.  Persistent atrial fibrillation: Managed with amiodarone for rhythm control and warfarin for anticoagulation.  He uses diltiazem only on an as-needed basis if he experiences atrial fibrillation.  This is not occurred in the past year.  Will update amiodarone monitoring with a chest x-ray, TSH, and liver function test.  He has an annual eye exam.  4.  Hypertension: Blood pressure well controlled on current agents and runs now in the low normal range.  5.  Hyperlipidemia: Treated with atorvastatin 40 mg.  Managed by his primary physician.  6.  Chronic kidney disease, stage III: We will follow with a metabolic panel today.  Current medicines are reviewed with the patient today.  The patient does not have concerns regarding medicines.  Labs/ tests ordered today include:  No orders of the defined types were placed in this encounter.   Disposition:   FU one year  Signed, Tonny Bollman, MD  03/24/2017 12:41 PM    [facility] [address] [place], [place], [place]  [address] Phone: [phone]; Fax: [phone]

## 2017-03-24 NOTE — Patient Instructions (Signed)
Medication Instructions:  Your provider recommends that you continue on your current medications as directed. Please refer to the Current Medication list given to you today.    Labwork: TODAY: CMET, lipids, CBC, TSH  Testing/Procedures: Dr. Excell Seltzerooper recommends you have a CHEST XRAY. [facility] ([address]) to have this done. You do not need an appointment.  Follow-Up: Your provider wants you to follow-up in: 1 year with Dr. Excell Seltzerooper. You will receive a reminder letter in the mail two months in advance. If you don't receive a letter, please call our office to schedule the follow-up appointment.    Any Other Special Instructions Will Be Listed Below (If Applicable).     If you need a refill on your cardiac medications before your next appointment, please call your pharmacy.

## 2017-03-25 LAB — CBC WITH DIFFERENTIAL/PLATELET
Basophils Absolute: 0.1 10*3/uL (ref 0.0–0.2)
Basos: 1 %
EOS (ABSOLUTE): 0.3 10*3/uL (ref 0.0–0.4)
Eos: 4 %
Hematocrit: 47.3 % (ref 37.5–51.0)
Hemoglobin: 15.8 g/dL (ref 13.0–17.7)
Immature Grans (Abs): 0 10*3/uL (ref 0.0–0.1)
Immature Granulocytes: 0 %
Lymphocytes Absolute: 2.3 10*3/uL (ref 0.7–3.1)
Lymphs: 32 %
MCH: 31.2 pg (ref 26.6–33.0)
MCHC: 33.4 g/dL (ref 31.5–35.7)
MCV: 93 fL (ref 79–97)
Monocytes Absolute: 0.8 10*3/uL (ref 0.1–0.9)
Monocytes: 11 %
Neutrophils Absolute: 3.6 10*3/uL (ref 1.4–7.0)
Neutrophils: 52 %
Platelets: 209 10*3/uL (ref 150–379)
RBC: 5.07 x10E6/uL (ref 4.14–5.80)
RDW: 14.8 % (ref 12.3–15.4)
WBC: 7 10*3/uL (ref 3.4–10.8)

## 2017-03-25 LAB — COMPREHENSIVE METABOLIC PANEL
ALT: 33 IU/L (ref 0–44)
AST: 33 IU/L (ref 0–40)
Albumin/Globulin Ratio: 1.3 (ref 1.2–2.2)
Albumin: 4.4 g/dL (ref 3.5–4.8)
Alkaline Phosphatase: 84 IU/L (ref 39–117)
BUN/Creatinine Ratio: 16 (ref 10–24)
BUN: 28 mg/dL — ABNORMAL HIGH (ref 8–27)
Bilirubin Total: 0.6 mg/dL (ref 0.0–1.2)
CO2: 23 mmol/L (ref 20–29)
Calcium: 9.7 mg/dL (ref 8.6–10.2)
Chloride: 101 mmol/L (ref 96–106)
Creatinine, Ser: 1.7 mg/dL — ABNORMAL HIGH (ref 0.76–1.27)
GFR calc Af Amer: 44 mL/min/{1.73_m2} — ABNORMAL LOW (ref >59)
GFR calc non Af Amer: 38 mL/min/{1.73_m2} — ABNORMAL LOW (ref >59)
Globulin, Total: 3.4 g/dL (ref 1.5–4.5)
Glucose: 98 mg/dL (ref 65–99)
Potassium: 4.1 mmol/L (ref 3.5–5.2)
Sodium: 138 mmol/L (ref 134–144)
Total Protein: 7.8 g/dL (ref 6.0–8.5)

## 2017-03-25 LAB — LIPID PANEL
Chol/HDL Ratio: 3.3 ratio (ref 0.0–5.0)
Cholesterol, Total: 135 mg/dL (ref 100–199)
HDL: 41 mg/dL (ref >39)
LDL Calculated: 75 mg/dL (ref 0–99)
Triglycerides: 94 mg/dL (ref 0–149)
VLDL Cholesterol Cal: 19 mg/dL (ref 5–40)

## 2017-03-25 LAB — TSH: TSH: 2.08 u[IU]/mL (ref 0.450–4.500)

## 2017-04-19 ENCOUNTER — Other Ambulatory Visit: Payer: Self-pay | Admitting: Cardiovascular Disease

## 2017-04-19 DIAGNOSIS — I10 Essential (primary) hypertension: Secondary | ICD-10-CM

## 2017-04-19 DIAGNOSIS — I4819 Other persistent atrial fibrillation: Secondary | ICD-10-CM

## 2017-04-28 ENCOUNTER — Ambulatory Visit (INDEPENDENT_AMBULATORY_CARE_PROVIDER_SITE_OTHER): Payer: Medicare Other | Admitting: *Deleted

## 2017-04-28 DIAGNOSIS — I480 Paroxysmal atrial fibrillation: Secondary | ICD-10-CM | POA: Diagnosis not present

## 2017-04-28 DIAGNOSIS — Z5181 Encounter for therapeutic drug level monitoring: Secondary | ICD-10-CM

## 2017-04-28 DIAGNOSIS — I4891 Unspecified atrial fibrillation: Secondary | ICD-10-CM

## 2017-04-28 LAB — POCT INR: INR: 2.3

## 2017-04-28 NOTE — Patient Instructions (Signed)
Continue coumadin 1 tablet daily Continue same amt of greens Recheck in 6 weeks 

## 2017-06-04 ENCOUNTER — Other Ambulatory Visit: Payer: Self-pay | Admitting: Cardiovascular Disease

## 2017-06-09 ENCOUNTER — Ambulatory Visit (INDEPENDENT_AMBULATORY_CARE_PROVIDER_SITE_OTHER): Payer: Medicare Other | Admitting: *Deleted

## 2017-06-09 DIAGNOSIS — I4891 Unspecified atrial fibrillation: Secondary | ICD-10-CM

## 2017-06-09 DIAGNOSIS — I480 Paroxysmal atrial fibrillation: Secondary | ICD-10-CM | POA: Diagnosis not present

## 2017-06-09 DIAGNOSIS — Z5181 Encounter for therapeutic drug level monitoring: Secondary | ICD-10-CM

## 2017-06-09 LAB — POCT INR: INR: 2.5 (ref 2.0–3.0)

## 2017-06-09 NOTE — Patient Instructions (Signed)
Continue coumadin 1 tablet daily Continue same amt of greens Recheck in 6 weeks 

## 2017-07-21 ENCOUNTER — Ambulatory Visit (INDEPENDENT_AMBULATORY_CARE_PROVIDER_SITE_OTHER): Payer: Medicare Other | Admitting: *Deleted

## 2017-07-21 DIAGNOSIS — I4891 Unspecified atrial fibrillation: Secondary | ICD-10-CM | POA: Diagnosis not present

## 2017-07-21 DIAGNOSIS — Z5181 Encounter for therapeutic drug level monitoring: Secondary | ICD-10-CM | POA: Diagnosis not present

## 2017-07-21 DIAGNOSIS — I480 Paroxysmal atrial fibrillation: Secondary | ICD-10-CM | POA: Diagnosis not present

## 2017-07-21 LAB — POCT INR: INR: 2.1 (ref 2.0–3.0)

## 2017-07-21 NOTE — Patient Instructions (Signed)
Continue coumadin 1 tablet daily Continue same amt of greens Recheck in 6 weeks 

## 2017-08-16 ENCOUNTER — Other Ambulatory Visit: Payer: Self-pay | Admitting: Cardiovascular Disease

## 2017-09-01 ENCOUNTER — Ambulatory Visit (INDEPENDENT_AMBULATORY_CARE_PROVIDER_SITE_OTHER): Payer: Medicare Other | Admitting: *Deleted

## 2017-09-01 DIAGNOSIS — I480 Paroxysmal atrial fibrillation: Secondary | ICD-10-CM

## 2017-09-01 DIAGNOSIS — I4891 Unspecified atrial fibrillation: Secondary | ICD-10-CM

## 2017-09-01 DIAGNOSIS — Z5181 Encounter for therapeutic drug level monitoring: Secondary | ICD-10-CM | POA: Diagnosis not present

## 2017-09-01 LAB — POCT INR: INR: 2.3 (ref 2.0–3.0)

## 2017-09-01 NOTE — Patient Instructions (Signed)
Continue coumadin 1 tablet daily Continue same amt of greens Recheck in 6 weeks 

## 2017-10-13 ENCOUNTER — Ambulatory Visit (INDEPENDENT_AMBULATORY_CARE_PROVIDER_SITE_OTHER): Payer: Medicare Other | Admitting: *Deleted

## 2017-10-13 DIAGNOSIS — I4891 Unspecified atrial fibrillation: Secondary | ICD-10-CM

## 2017-10-13 DIAGNOSIS — Z5181 Encounter for therapeutic drug level monitoring: Secondary | ICD-10-CM

## 2017-10-13 DIAGNOSIS — I480 Paroxysmal atrial fibrillation: Secondary | ICD-10-CM | POA: Diagnosis not present

## 2017-10-13 LAB — POCT INR: INR: 2.3 (ref 2.0–3.0)

## 2017-10-13 NOTE — Patient Instructions (Signed)
Continue coumadin 1 tablet daily Continue same amt of greens Recheck in 6 weeks 

## 2017-11-24 ENCOUNTER — Ambulatory Visit (INDEPENDENT_AMBULATORY_CARE_PROVIDER_SITE_OTHER): Payer: Medicare Other | Admitting: *Deleted

## 2017-11-24 DIAGNOSIS — Z5181 Encounter for therapeutic drug level monitoring: Secondary | ICD-10-CM

## 2017-11-24 DIAGNOSIS — I4891 Unspecified atrial fibrillation: Secondary | ICD-10-CM

## 2017-11-24 DIAGNOSIS — I480 Paroxysmal atrial fibrillation: Secondary | ICD-10-CM

## 2017-11-24 LAB — POCT INR: INR: 2.4 (ref 2.0–3.0)

## 2017-11-24 NOTE — Addendum Note (Signed)
Addended by: Louanna RawEID, LISA M on: 11/24/2017 09:04 AM   Modules accepted: Level of Service

## 2017-11-24 NOTE — Patient Instructions (Signed)
Continue coumadin 1 tablet daily Continue same amt of greens Recheck in 6 weeks

## 2017-12-16 ENCOUNTER — Other Ambulatory Visit: Payer: Self-pay | Admitting: Cardiovascular Disease

## 2018-01-05 ENCOUNTER — Ambulatory Visit (INDEPENDENT_AMBULATORY_CARE_PROVIDER_SITE_OTHER): Payer: Medicare Other | Admitting: Pharmacist

## 2018-01-05 DIAGNOSIS — I480 Paroxysmal atrial fibrillation: Secondary | ICD-10-CM

## 2018-01-05 DIAGNOSIS — Z5181 Encounter for therapeutic drug level monitoring: Secondary | ICD-10-CM | POA: Diagnosis not present

## 2018-01-05 LAB — POCT INR: INR: 1.8 — AB (ref 2.0–3.0)

## 2018-01-05 NOTE — Patient Instructions (Signed)
Description   Take 1.5 tablets today then continue coumadin 1 tablet daily Continue same amt of greens Recheck in 5 weeks

## 2018-02-09 ENCOUNTER — Ambulatory Visit (INDEPENDENT_AMBULATORY_CARE_PROVIDER_SITE_OTHER): Payer: Medicare Other | Admitting: Pharmacist

## 2018-02-09 DIAGNOSIS — I480 Paroxysmal atrial fibrillation: Secondary | ICD-10-CM

## 2018-02-09 DIAGNOSIS — Z5181 Encounter for therapeutic drug level monitoring: Secondary | ICD-10-CM

## 2018-02-09 LAB — POCT INR: INR: 2.2 (ref 2.0–3.0)

## 2018-02-09 NOTE — Patient Instructions (Signed)
Description   Continue coumadin 1 tablet daily Continue same amt of greens Recheck in 6 weeks

## 2018-02-11 ENCOUNTER — Other Ambulatory Visit: Payer: Self-pay | Admitting: Cardiovascular Disease

## 2018-02-11 DIAGNOSIS — I10 Essential (primary) hypertension: Secondary | ICD-10-CM

## 2018-02-11 DIAGNOSIS — I4819 Other persistent atrial fibrillation: Secondary | ICD-10-CM

## 2018-03-20 ENCOUNTER — Other Ambulatory Visit: Payer: Self-pay | Admitting: Cardiovascular Disease

## 2018-03-27 ENCOUNTER — Telehealth: Payer: Self-pay | Admitting: Pharmacist

## 2018-03-27 NOTE — Telephone Encounter (Signed)
1. Do you currently have a fever? No 2.  [place], or [place] [employer]) ? No 3. Have you been in contact with someone that is currently pending confirmation of Covid19 testing or has been confirmed to have the Covid19 virus?  No 4. Are you currently experiencing fatigue or cough? No  Pt. Advised that we are restricting visitors at this time and anyone present in the vehicle should meet the above criteria as well. Advised that visit will be at curbside for finger stick ONLY and will receive call with instructions.

## 2018-03-28 ENCOUNTER — Ambulatory Visit (INDEPENDENT_AMBULATORY_CARE_PROVIDER_SITE_OTHER): Payer: Medicare Other | Admitting: *Deleted

## 2018-03-28 DIAGNOSIS — I480 Paroxysmal atrial fibrillation: Secondary | ICD-10-CM | POA: Diagnosis not present

## 2018-03-28 DIAGNOSIS — Z5181 Encounter for therapeutic drug level monitoring: Secondary | ICD-10-CM | POA: Diagnosis not present

## 2018-03-28 DIAGNOSIS — I4891 Unspecified atrial fibrillation: Secondary | ICD-10-CM | POA: Diagnosis not present

## 2018-03-28 LAB — POCT INR: INR: 2.7 (ref 2.0–3.0)

## 2018-03-28 NOTE — Patient Instructions (Signed)
Continue coumadin 1 tablet daily Continue same amt of greens Recheck in 8 weeks

## 2018-05-16 ENCOUNTER — Telehealth: Payer: Self-pay | Admitting: Cardiovascular Disease

## 2018-05-16 NOTE — Telephone Encounter (Signed)
I spoke to the patient who has a hx of Afib and lately it seems like it is more frequent.  He has Diltiazem 30 mg prn for palpitations and has had to take 1/2 tablet 15 mg more often.  His HR gets up to 100, he takes 15 mg and it lowers it to 69 bpm.  His BP elevates to 155/95.  He is asymptomatic.  He just wanted to inform Dr Excell Seltzer of the frequency which this is occurring.

## 2018-05-16 NOTE — Telephone Encounter (Signed)
Recommend increase carvedilol to 12.5 mg BID and increase amiodarone to 200 mg daily. I'd like to see him in the office in 3-4 weeks on a DOD day if he is willing to come in. thanks

## 2018-05-16 NOTE — Telephone Encounter (Signed)
  Patient states that he is having some afib off an on over the last 3 months. He had an episode last night and he took diltiazem (CARDIZEM) 30 MG tablet and his HR went down. He would like to know what he should do.

## 2018-05-16 NOTE — Telephone Encounter (Signed)
Will fwd to [facility] triage as this patient has never seen provider in [place], only gets coumadin checked there.  Looks like only seen by Dr. Excell Seltzer.

## 2018-05-17 ENCOUNTER — Encounter (HOSPITAL_COMMUNITY): Payer: Self-pay | Admitting: *Deleted

## 2018-05-17 ENCOUNTER — Emergency Department (HOSPITAL_COMMUNITY): Payer: Medicare Other

## 2018-05-17 ENCOUNTER — Other Ambulatory Visit: Payer: Self-pay

## 2018-05-17 ENCOUNTER — Emergency Department (HOSPITAL_COMMUNITY)
Admission: EM | Admit: 2018-05-17 | Discharge: 2018-05-17 | Disposition: A | Discharge status: Home / Self Care | Payer: Medicare Other | Attending: Emergency Medicine | Admitting: Emergency Medicine

## 2018-05-17 DIAGNOSIS — I5042 Chronic combined systolic (congestive) and diastolic (congestive) heart failure: Secondary | ICD-10-CM | POA: Diagnosis not present

## 2018-05-17 DIAGNOSIS — Z7901 Long term (current) use of anticoagulants: Secondary | ICD-10-CM | POA: Diagnosis not present

## 2018-05-17 DIAGNOSIS — I252 Old myocardial infarction: Secondary | ICD-10-CM | POA: Insufficient documentation

## 2018-05-17 DIAGNOSIS — I130 Hypertensive heart and chronic kidney disease with heart failure and stage 1 through stage 4 chronic kidney disease, or unspecified chronic kidney disease: Secondary | ICD-10-CM | POA: Insufficient documentation

## 2018-05-17 DIAGNOSIS — Z7982 Long term (current) use of aspirin: Secondary | ICD-10-CM | POA: Insufficient documentation

## 2018-05-17 DIAGNOSIS — Z87891 Personal history of nicotine dependence: Secondary | ICD-10-CM | POA: Diagnosis not present

## 2018-05-17 DIAGNOSIS — N189 Chronic kidney disease, unspecified: Secondary | ICD-10-CM | POA: Diagnosis not present

## 2018-05-17 DIAGNOSIS — Z79899 Other long term (current) drug therapy: Secondary | ICD-10-CM | POA: Insufficient documentation

## 2018-05-17 DIAGNOSIS — I4891 Unspecified atrial fibrillation: Secondary | ICD-10-CM | POA: Diagnosis present

## 2018-05-17 LAB — BASIC METABOLIC PANEL
Anion gap: 14 (ref 5–15)
BUN: 32 mg/dL — ABNORMAL HIGH (ref 8–23)
CO2: 24 mmol/L (ref 22–32)
Calcium: 9.8 mg/dL (ref 8.9–10.3)
Chloride: 99 mmol/L (ref 98–111)
Creatinine, Ser: 1.74 mg/dL — ABNORMAL HIGH (ref 0.61–1.24)
GFR calc Af Amer: 43 mL/min — ABNORMAL LOW (ref >60)
GFR calc non Af Amer: 37 mL/min — ABNORMAL LOW (ref >60)
Glucose, Bld: 126 mg/dL — ABNORMAL HIGH (ref 70–99)
Potassium: 3.7 mmol/L (ref 3.5–5.1)
Sodium: 137 mmol/L (ref 135–145)

## 2018-05-17 LAB — CBC WITH DIFFERENTIAL/PLATELET
Abs Immature Granulocytes: 0.01 10*3/uL (ref 0.00–0.07)
Basophils Absolute: 0.1 10*3/uL (ref 0.0–0.1)
Basophils Relative: 1 %
Eosinophils Absolute: 0.3 10*3/uL (ref 0.0–0.5)
Eosinophils Relative: 5 %
HCT: 49.2 % (ref 39.0–52.0)
Hemoglobin: 16.7 g/dL (ref 13.0–17.0)
Immature Granulocytes: 0 %
Lymphocytes Relative: 30 %
Lymphs Abs: 2 10*3/uL (ref 0.7–4.0)
MCH: 31 pg (ref 26.0–34.0)
MCHC: 33.9 g/dL (ref 30.0–36.0)
MCV: 91.3 fL (ref 80.0–100.0)
Monocytes Absolute: 0.8 10*3/uL (ref 0.1–1.0)
Monocytes Relative: 12 %
Neutro Abs: 3.5 10*3/uL (ref 1.7–7.7)
Neutrophils Relative %: 52 %
Platelets: 209 10*3/uL (ref 150–400)
RBC: 5.39 MIL/uL (ref 4.22–5.81)
RDW: 14.3 % (ref 11.5–15.5)
WBC: 6.6 10*3/uL (ref 4.0–10.5)
nRBC: 0 % (ref 0.0–0.2)

## 2018-05-17 LAB — TROPONIN I: Troponin I: <0.03 ng/mL (ref <0.03)

## 2018-05-17 LAB — BRAIN NATRIURETIC PEPTIDE: B Natriuretic Peptide: 86 pg/mL (ref 0.0–100.0)

## 2018-05-17 LAB — PROTIME-INR
INR: 2.8 — ABNORMAL HIGH (ref 0.8–1.2)
Prothrombin Time: 28.8 seconds — ABNORMAL HIGH (ref 11.4–15.2)

## 2018-05-17 MED ORDER — CARVEDILOL 12.5 MG PO TABS
6.2500 mg | ORAL_TABLET | Freq: Once | ORAL | Status: AC
  Administered 2018-05-17: 6.25 mg via ORAL
  Filled 2018-05-17: qty 1

## 2018-05-17 MED ORDER — DILTIAZEM HCL 30 MG PO TABS
15.0000 mg | ORAL_TABLET | Freq: Once | ORAL | Status: AC
  Administered 2018-05-17: 15 mg via ORAL
  Filled 2018-05-17: qty 1

## 2018-05-17 NOTE — Discharge Instructions (Addendum)
Your Cardiologist recommended increasing your carvedilol to 12.5 mg by mouth twice a day and increase your amiodarone to 200 mg by mouth daily. Otherwise take your usual prescriptions as previously directed.  Call your regular Cardiologist today to schedule a follow up appointment within the next 3 days.  Return to the Emergency Department immediately sooner if worsening.

## 2018-05-17 NOTE — Telephone Encounter (Signed)
Left message to call back  

## 2018-05-17 NOTE — ED Triage Notes (Signed)
Pt c/o high heart rate and dizziness x 3 days. Pt reports his heart rate has been up into the 130's for the past 3 days. He has been taking his Cardizem for the past 3 days as directed when his heat rate goes up. Pt reports he used to take Cardizem daily but now he only takes it as needed for increased heart rate.

## 2018-05-17 NOTE — ED Notes (Signed)
Pts pulse stayed under 94 while ambulating

## 2018-05-17 NOTE — Telephone Encounter (Signed)
Spoke with the patient, who is in the ED at [facility]. He states his HR is 141 and they are working to get it lower. Will monitor records and will touch base with patient after discharge.

## 2018-05-17 NOTE — ED Notes (Signed)
Pt transported to xray 

## 2018-05-17 NOTE — ED Provider Notes (Signed)
[facility] EMERGENCY DEPARTMENT Provider Note   CSN: [id number] Arrival date & time: 05/17/18  1049    History   Chief Complaint Chief Complaint  Patient presents with  . Atrial Fibrillation    HPI Gregory SLEMMER is a 78 y.o. male.     HPI  Pt was seen at 1120. Per pt, c/o gradual onset and persistence of multiple intermittent episodes of palpitations for the past 3 days. Describes the palpitations as "fluttering" in his chest. Pt states he took his HR intermittently and at one time it was "130's." Pt states he was taking Cardizem 15-30 mg PO when he felt the "fluttering;" with improvement of the sensation. Pt states he took his usual meds this morning, as well as cardizem 15mg  PO. Pt states he continues to have the "fluttering" sensation in his chest. States he "used to take cardizem daily" but this was cut back to as needed. Denies fevers, cough, known COVID+ exposure. Denies CP/SOB, no back pain, no abd pain, no N/V/D, no rash, no calf/LE pain or unilateral swelling.      Past Medical History:  Diagnosis Date  . Anginal pain (HCC)   . CAD (coronary artery disease)    a, 2011: occluded LAD with left to right collaterals and moderate disease in the right coronary artery and circumflex, EF 50%. b. NSTEMI 07/2011 in setting of AF-RVR, MV abnl, cath w/ LAD 100%, RCA mod-severe dz, small vessel, med rx  . Chronic renal insufficiency    followed by primary care physician  . Gout   . Hypertension    long-standing  . Myocardial infarction (HCC)   . Paroxysmal atrial fibrillation (HCC)    a. On Coumadin therapy/amidarone. b. 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  . Pulmonary embolism [facility])     Patient Active Problem List   Diagnosis Date Noted  . Chronic combined systolic and diastolic CHF (congestive heart failure) (HCC) 01/27/2016  . Pure hypercholesterolemia 01/27/2016  . 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 Encounter for therapeutic drug monitoring  02/13/2013  . History of non-ST elevation myocardial infarction (NSTEMI) 07/13/2011  . Atrial fibrillation with RVR (HCC) 07/12/2011  . Persistent atrial fibrillation   . Coronary artery disease involving native coronary artery of native heart without angina pectoris   . Hypertension   . Renal insufficiency   . Pulmonary embolism (HCC)   . History of amiodarone therapy 07/27/2010  . Long term (current) use of anticoagulants 04/13/2010  . EDEMA 11/20/2009  . CHRONIC RHINITIS 02/04/2009    Past Surgical History:  Procedure Laterality Date  . APPENDECTOMY    . CARDIAC CATHETERIZATION  2011 & 2013   2013 L [place], LAD 100%, CFX 50%, RCA 80%, small vessel, med rx  . CORONARY ARTERY BYPASS GRAFT N/A 02/14/2014   Procedure: CORONARY ARTERY BYPASS GRAFTING (CABG)X4 LIMA-LAD; SVG-RCA(ENDARDERECTOMY); SVG-OM; SVG-DIAG;  Surgeon: Kerin Perna, MD;  Location: [facility] OR;  Service: Open Heart Surgery;  Laterality: N/A;  . LEFT HEART CATHETERIZATION WITH CORONARY ANGIOGRAM N/A 02/08/2014   Procedure: LEFT HEART CATHETERIZATION WITH CORONARY ANGIOGRAM;  Surgeon: Micheline Chapman, MD;  Location: [facility] CATH LAB;  Service: Cardiovascular;  Laterality: N/A;  . NM MYOVIEW LTD  2013   Scar in the anteroseptal/periapical distribution with moderate peri-infarct ischemia, EF 43%  . STERNAL INCISION RECLOSURE N/A 02/28/2014   Procedure: Irrigation and Debridement of Sternum with Sternal Rewiring;  Surgeon: Kerin Perna, MD;  Location: [facility] OR;  Service: Thoracic;  Laterality: N/A;  . TEE WITHOUT CARDIOVERSION N/A 02/14/2014   Procedure: TRANSESOPHAGEAL ECHOCARDIOGRAM (TEE);  Surgeon: Kerin PernaPeter Van Trigt, MD;  [facility];  Service: Open Heart Surgery;  Laterality: N/A;  . TOTAL KNEE ARTHROPLASTY Bilateral         Home Medications    Prior to Admission medications   Medication Sig Start Date End Date Taking? Authorizing Provider  allopurinol (ZYLOPRIM) 300 MG tablet Take 0.5 tablets (150 mg total) by mouth  daily. 03/05/14  Yes [name], Gina L, PA-C  amiodarone (PACERONE) 200 MG tablet TAKE 1/2 TABLET BY MOUTH DAILY 12/16/17  Yes Tonny Bollmanooper, Michael, MD  aspirin EC 81 MG tablet Take 81 mg by mouth daily.   Yes [provider]  atorvastatin (LIPITOR) 40 MG tablet Take 1 tablet (40 mg total) by mouth daily at 6 PM. 03/05/14  Yes [name], Gina L, PA-C  carvedilol (COREG) 6.25 MG tablet TAKE ONE TABLET BY MOUTH TWICE DAILY WITH MEALS 02/13/18  Yes Tonny Bollmanooper, Michael, MD  chlorthalidone (HYGROTON) 25 MG tablet Take 0.5 tablets by mouth daily. 03/18/17  Yes [provider]  COLCRYS 0.6 MG tablet Take 1 tablet by mouth 2 (two) times daily as needed (gout).  11/24/10  Yes [provider]  diltiazem (CARDIZEM) 30 MG tablet Take 1 tablet (30 mg total) by mouth daily as needed (palpitations). Patient taking differently: Take 15-30 mg by mouth daily as needed (palpitations).  03/24/17  Yes Tonny Bollmanooper, Michael, MD  fluticasone [facility](FLONASE) 50 MCG/ACT nasal spray Place 1 spray into both nostrils daily as needed for allergies or rhinitis.   Yes [provider]  loratadine (CLARITIN) 10 MG tablet Take 10 mg by mouth daily.   Yes [provider]  multivitamin [facility](THERAGRAN) per tablet Take 1 tablet by mouth daily.     Yes [provider]  nitroGLYCERIN (NITROSTAT) 0.4 MG SL tablet Place 1 tablet (0.4 mg total) under the tongue every 5 (five) minutes x 3 doses as needed for chest pain. 04/30/13 05/17/18 Yes Tonny Bollmanooper, Michael, MD  warfarin (COUMADIN) 5 MG tablet TAKE ONE TABLET DAILY AS DIRECTED 03/20/18  Yes Tonny Bollmanooper, Michael, MD    Family History Family History  Problem Relation Age of Onset  . Heart attack Mother   . Cancer Mother   . Prostate cancer Father   . Coronary artery disease Brother        MI    Social History Social History   Tobacco Use  . Smoking status: Former Smoker    Types: Cigarettes    Last attempt to quit: 01/04/1966    Years since quitting: 52.4  . Smokeless  tobacco: Former NeurosurgeonUser    Types: Chew    Quit date: 01/05/2007  . Tobacco comment: quit chewing tobacco about 4 yrs ago  Substance Use Topics  . Alcohol use: Yes    Comment: "seldomly drink beer" 1 every 2-3 weeks  . Drug use: No     Allergies   Azithromycin and Oxycodone   Review of Systems Review of Systems ROS: Statement: All systems negative except as marked or noted in the HPI; Constitutional: Negative for fever and chills. ; ; Eyes: Negative for eye pain, redness and discharge. ; ; ENMT: Negative for ear pain, hoarseness, nasal congestion, sinus pressure and sore throat. ; ; Cardiovascular: +palpitations. Negative for chest pain, diaphoresis, dyspnea and peripheral edema. ; ; Respiratory: Negative for cough, wheezing and stridor. ; ; Gastrointestinal: Negative for nausea, vomiting, diarrhea, abdominal pain, blood in stool, hematemesis, jaundice  and rectal bleeding. . ; ; Genitourinary: Negative for dysuria, flank pain and hematuria. ; ; Musculoskeletal: Negative for back pain and neck pain. Negative for swelling and trauma.; ; Skin: Negative for pruritus, rash, abrasions, blisters, bruising and skin lesion.; ; Neuro: Negative for headache, lightheadedness and neck stiffness. Negative for weakness, altered level of consciousness, altered mental status, extremity weakness, paresthesias, involuntary movement, seizure and syncope.       Physical Exam Updated Vital Signs BP 109/89   Pulse (!) 116   Temp 98.6 F (37 C) (Oral)   Resp (!) 22   Ht  (1.803 m)   Wt 99.8 kg   SpO2 (!) 88%   BMI 30.68 kg/m     Patient Vitals for the past 24 hrs:  BP Temp Temp src Pulse Resp SpO2 Height Weight  05/17/18 1309 - - - 80 (!) 26 93 % - -  05/17/18 1300 124/80 - - 79 16 91 % - -  05/17/18 1219 109/89 - - (!) 116 - - - -  05/17/18 1218 109/89 - - - - - - -  05/17/18 1130 (!) 139/91 - - (!) 58 (!) 22 (!) 88 % - -  05/17/18 1111 - - - 60 20 (!) 88 % - -  05/17/18 1108 139/89 98.6 F (37  C) Oral (!) 132 16 94 % - -  05/17/18 1105 - - - - - -  (1.803 m) 99.8 kg     Physical Exam 1125: Physical examination:  Nursing notes reviewed; Vital signs and O2 SAT reviewed;  Constitutional: Well developed, Well nourished, Well hydrated, In no acute distress; Head:  Normocephalic, atraumatic; Eyes: EOMI, PERRL, No scleral icterus; ENMT: Mouth and pharynx normal, Mucous membranes moist; Neck: Supple, Full range of motion, No lymphadenopathy; Cardiovascular: Irregular tachycardic rate and rhythm, No gallop; Respiratory: Breath sounds clear & equal bilaterally, No wheezes.  Speaking full sentences with ease, Normal respiratory effort/excursion; Chest: Nontender, Movement normal; Abdomen: Soft, Nontender, Nondistended, Normal bowel sounds; Genitourinary: No CVA tenderness; Extremities: Peripheral pulses normal, No tenderness, No edema, No calf edema or asymmetry.; Neuro: AA&Ox3, Major CN grossly intact.  Speech clear. No gross focal motor or sensory deficits in extremities.; Skin: Color normal, Warm, Dry.    ED Treatments / Results  Labs (all labs ordered are listed, but only abnormal results are displayed)   EKG EKG Interpretation  Date/Time:  Wednesday May 17 2018 11:02:29 EDT Ventricular Rate:  135 PR Interval:    QRS Duration: 104 QT Interval:  342 QTC Calculation: 524 R Axis:   105 Text Interpretation:  Atrial fibrillation Right axis deviation Low voltage, precordial leads Probable anteroseptal infarct, old ST depression, probably rate related Prolonged QT interval When compared with ECG of 05/23/2014 Atrial fibrillation has replaced Normal sinus rhythm QT has lengthened Confirmed by Samuel Jester [phone]) on 05/17/2018 12:12:09 PM   Radiology   Procedures Procedures (including critical care time)  Medications Ordered in ED Medications  diltiazem (CARDIZEM) tablet 15 mg (15 mg Oral Given 05/17/18 1218)  carvedilol (COREG) tablet 6.25 mg (6.25 mg Oral Given 05/17/18  1219)     Initial Impression / Assessment and Plan / ED Course  I have reviewed the triage vital signs and the nursing notes.  Pertinent labs & imaging results that were available during my care of the patient were reviewed by me and considered in my medical decision making (see chart for details).     MDM Reviewed: previous chart, nursing note and  vitals Reviewed previous: labs and ECG Interpretation: labs, ECG and x-ray    Results for orders placed or performed during the hospital encounter of 05/17/18  Protime-INR  Result Value Ref Range   Prothrombin Time 28.8 (H) 11.4 - 15.2 seconds   INR 2.8 (H) 0.8 - 1.2  CBC with Differential  Result Value Ref Range   WBC 6.6 4.0 - 10.5 K/uL   RBC 5.39 4.22 - 5.81 MIL/uL   Hemoglobin 16.7 13.0 - 17.0 g/dL   HCT 16.1 09.6 - 04.5 %   MCV 91.3 80.0 - 100.0 fL   MCH 31.0 26.0 - 34.0 pg   MCHC 33.9 30.0 - 36.0 g/dL   RDW 40.9 81.1 - 91.4 %   Platelets 209 150 - 400 K/uL   nRBC 0.0 0.0 - 0.2 %   Neutrophils Relative % 52 %   Neutro Abs 3.5 1.7 - 7.7 K/uL   Lymphocytes Relative 30 %   Lymphs Abs 2.0 0.7 - 4.0 K/uL   Monocytes Relative 12 %   Monocytes Absolute 0.8 0.1 - 1.0 K/uL   Eosinophils Relative 5 %   Eosinophils Absolute 0.3 0.0 - 0.5 K/uL   Basophils Relative 1 %   Basophils Absolute 0.1 0.0 - 0.1 K/uL   Immature Granulocytes 0 %   Abs Immature Granulocytes 0.01 0.00 - 0.07 K/uL  Troponin I - Once  Result Value Ref Range   Troponin I <0.03 <0.03 ng/mL  Brain natriuretic peptide  Result Value Ref Range   B Natriuretic Peptide 86.0 0.0 - 100.0 pg/mL  Basic metabolic panel  Result Value Ref Range   Sodium 137 135 - 145 mmol/L   Potassium 3.7 3.5 - 5.1 mmol/L   Chloride 99 98 - 111 mmol/L   CO2 24 22 - 32 mmol/L   Glucose, Bld 126 (H) 70 - 99 mg/dL   BUN 32 (H) 8 - 23 mg/dL   Creatinine, Ser 7.82 (H) 0.61 - 1.24 mg/dL   Calcium 9.8 8.9 - 95.6 mg/dL   GFR calc non Af Amer 37 (L) >60 mL/min   GFR calc Af Amer 43 (L)  >60 mL/min   Anion gap 14 5 - 15   Dg Chest 2 View Result Date: 05/17/2018 CLINICAL DATA:  Palpitations for 3 days.  Elevated heart rate EXAM: CHEST - 2 VIEW COMPARISON:  03/24/2017 FINDINGS: Previous median sternotomy and CABG procedure. Normal heart size. No pleural effusion or edema. Stable coarsened interstitial markings compatible with COPD. Scarring in the right base is again noted and appears unchanged. No superimposed airspace consolidation. IMPRESSION: No active cardiopulmonary disease. Electronically Signed   By: Signa Kell M.D.   On: 05/17/2018 12:39   Results for AUBRA, PAPPALARDO (MRN [id number]) as of 05/17/2018 12:47  Ref. Range 01/28/2016 09:20 03/24/2017 12:47 05/17/2018 12:03  BUN Latest Ref Range: 8 - 23 mg/dL 20 28 (H) 32 (H)  Creatinine Latest Ref Range: 0.61 - 1.24 mg/dL 4.69 (H) 6.29 (H) 5.28 (H)    1350:  Cardizem and carvedilol given PO with good effect. HR now 70-90's. Pt ambulated with HR remaining <94, resps easy, NAD.  Pt continues to deny CP/SOB. BUN/Cr per baseline. No clear indication for admission at this time. T/C returned from Cards Dr. Purvis Sheffield, case discussed, including:  HPI, pertinent PM/SHx, VS/PE, dx testing, ED course and treatment:  Agrees there is no clear indication for admission at this time, requests to follow Dr. Earmon Phoenix meds recommendations in Epic telephone note, f/u office. Dx and  testing, as well as d/w Cards MD, d/w pt. Pt states he "didn't get a call back" and "no one told me about medication changes." Telephone note reviewed with pt.  Questions answered.  Verb understanding, agreeable to d/c home with outpt f/u.       Final Clinical Impressions(s) / ED Diagnoses   Final diagnoses:  None    ED Discharge Orders    None       Samuel Jester, DO 05/22/18 0454

## 2018-05-18 MED ORDER — CARVEDILOL 12.5 MG PO TABS: 12.5000 mg | ORAL_TABLET | Freq: Two times a day (BID) | ORAL | 3 refills | Status: DC

## 2018-05-18 MED ORDER — AMIODARONE HCL 200 MG PO TABS: 200.0000 mg | ORAL_TABLET | Freq: Every day | ORAL | 3 refills | Status: DC

## 2018-05-18 NOTE — Telephone Encounter (Addendum)
Called the pt today to go over his med changes recommended by Dr. Excell Seltzer and he verbalized understanding of the changes.. will send in new RX for his Coreg and Amiodarone... pt says he is feeling well today except his BP is 165/106... he denies headache, dizziness, chest pain..he just got done showering and eating breakfast.   I asked him to go ahead and take his PRN Diltiazem and to rest and I will call him back in an hour or so and have him recheck his BP... pt will call back sooner if he develops any symptoms.

## 2018-05-18 NOTE — Telephone Encounter (Signed)
Spoke with the pt and he rechecked his BP and it is improved 131/82 HR70 and he says he feels well.. 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 he will recheck BP later this evening and in the morning and will record them and if elevated again will call and let us know.   Will forward to Dr. Patrice Paradise for review.

## 2018-05-22 ENCOUNTER — Telehealth: Payer: Self-pay | Admitting: Pharmacist

## 2018-05-22 MED ORDER — APIXABAN 5 MG PO TABS: 5.0000 mg | ORAL_TABLET | Freq: Two times a day (BID) | ORAL | 5 refills | Status: DC

## 2018-05-22 NOTE — Telephone Encounter (Signed)
Contacted patient to switch to DOAC due to decreased in office exposure as well as better side effect profile. Patient qualifies for switch from warfarin to a DOAC and was contacted on 05/10/2017 to assess willingness to switch. Patient reports okay with switching and was okay with Eliquis price ($47/month). Instructed patient to continue taking warfarin and will need to come in tomorrow for his INR check. Will switch patient over to Eliquis once INR checked tomorrow.

## 2018-05-23 ENCOUNTER — Ambulatory Visit (INDEPENDENT_AMBULATORY_CARE_PROVIDER_SITE_OTHER): Payer: Medicare Other | Admitting: *Deleted

## 2018-05-23 DIAGNOSIS — I4891 Unspecified atrial fibrillation: Secondary | ICD-10-CM

## 2018-05-23 DIAGNOSIS — Z5181 Encounter for therapeutic drug level monitoring: Secondary | ICD-10-CM

## 2018-05-23 LAB — POCT INR: INR: 3 (ref 2.0–3.0)

## 2018-05-23 NOTE — Patient Instructions (Signed)
SWITCHING FROM WARFARIN TO ELIQUIS Hold coumadin tonight.  Start Eliquis tomorrow night and take twice daily thereafter. Continue same amt of greens 1 month eliquis follow up appt made for June. Eliquis teaching has been done and he verbalized understanding.

## 2018-05-24 NOTE — Telephone Encounter (Signed)
Left message to call back.  Will arrange face to face OV 6/17 with Dr. Excell Seltzer if patient is willing to come to the office.

## 2018-05-25 ENCOUNTER — Encounter: Payer: Self-pay | Admitting: Cardiovascular Disease

## 2018-05-25 NOTE — Telephone Encounter (Signed)
Scheduled patient for face to face visit with Dr. Excell Seltzer 6/17 (DOD day).  He BP is a little elevated (140-150/80-90) x 2 days. He has had several medication changes recently. He will monitor BP for the next few days and call next week if BP is consistently over 140/90.  He was grateful for assistance.

## 2018-05-25 NOTE — Telephone Encounter (Signed)
This encounter was created in error - please disregard.

## 2018-05-25 NOTE — Telephone Encounter (Signed)
New message: ° ° ° ° °Patient returning your call back. Please call patient. °

## 2018-06-07 ENCOUNTER — Telehealth: Payer: Self-pay | Admitting: Cardiovascular Disease

## 2018-06-07 NOTE — Telephone Encounter (Signed)
Gregory Davila called to see if his BP medications need to be adjusted. He checks his BP several times daily and it averages between upper 130s-150s/70-90. He has been a little fatigued lately but doesn't necessarily contribute that to BP.  His blood pressure is a little lower in the afternoon. He will occasionally get a reading in the 120/upper 60s range. His HR is staying between upper 50s-80 bpm.  He was recently in the ED. He is currently taking: Coreg 25 mg BID Amiodarone 200 mg daily  Chlorthalidone 12.5 mg daily  He has not taken any diltiazem since ED visit.   He understands he will be called with Dr. Earmon Phoenix recommendations. He has a face to face visit with Dr. Excell Seltzer scheduled 6/17.

## 2018-06-07 NOTE — Telephone Encounter (Signed)
New  Message:   Patient calling to report his BP 06/02/18 am 150/83 HR 65, pm 134/75 Hr 67, 06/03/18 pm 125/67 HR 77, 06/04/18 am 116/63 HR 59, pm 131/80 HR 61, 06/05/18 am 156/92 HR 61 pm 157/87 HR 77, 133/70 HR 72 after he took medication. 06/06/18 am 140/84 Hr 59, 153/82 HR 58 after taking medication, pm 134/75 Hr 70. This  morning 137/81 HR 59. After took medication 142/87 HR 63. Patient would like to know if he should up his medication.

## 2018-06-12 NOTE — Telephone Encounter (Signed)
Reviewed BP readings. He has mild-moderate LV dysfunction currently on coreg at goal dose and a thiazide diuretic. Creatinine in 1.7 range. Would try losartan 25 mg daily and follow-up BMET in 2 weeks. Will need to watch metabolic panel in light of his CKD, but ACE/ARB is indicated in setting of CAD/LV dysfunction.

## 2018-06-12 NOTE — Telephone Encounter (Signed)
Called to give the patient medication instruction to START LOSARTAN 25 mg daily. He states in the last 5 days, his BP has decreased and really "calmed down." He reviewed several readings over the last 5 days ranging from 118/60 to 143/80 with an average reading of 122/78. Instead of starting the new medication, he will continue to monitor his BP and bring his readings to his OV next week for review with Dr. Burt Knack. He was grateful for call and agrees with treatment plan.

## 2018-06-20 ENCOUNTER — Telehealth: Payer: Self-pay

## 2018-06-20 NOTE — Telephone Encounter (Signed)
Called patient to COVID screen for in-office appointment tomorrow. Left message to call back.

## 2018-06-20 NOTE — Telephone Encounter (Signed)
Screened patient for in office visit tomorrow. He understands to wear a mask. He understands there is a no Teaching laboratory technician. He will call in the morning prior to visit if any of the below answers change.   COVID-19 Pre-Screening Questions:  . In the past 7 to 10 days have you had a cough,  shortness of breath, headache, congestion, fever (100 or greater) body aches, chills, sore throat, or sudden loss of taste or sense of smell? NO . Have you been around anyone with known Covid 19? NO . Have you been around anyone who is awaiting Covid 19 test results in the past 7 to 10 days? NO . Have you been around anyone who has been exposed to Covid 19, or has mentioned symptoms of Covid 19 within the past 7 to 10 days? NO  If you have any concerns/questions about symptoms patients report during screening (either on the phone or at threshold). Contact the provider seeing the patient or DOD for further guidance.  If neither are available contact a member of the leadership team.

## 2018-06-21 ENCOUNTER — Ambulatory Visit: Payer: Medicare Other | Admitting: Cardiovascular Disease

## 2018-06-21 ENCOUNTER — Encounter: Payer: Self-pay | Admitting: Cardiovascular Disease

## 2018-06-21 ENCOUNTER — Other Ambulatory Visit: Payer: Self-pay

## 2018-06-21 VITALS — BP 136/82 | HR 67 | Temp 95.0°F | Ht 71.0 in | Wt 227.2 lb

## 2018-06-21 DIAGNOSIS — I480 Paroxysmal atrial fibrillation: Secondary | ICD-10-CM | POA: Diagnosis not present

## 2018-06-21 DIAGNOSIS — I10 Essential (primary) hypertension: Secondary | ICD-10-CM | POA: Diagnosis not present

## 2018-06-21 DIAGNOSIS — I2510 Atherosclerotic heart disease of native coronary artery without angina pectoris: Secondary | ICD-10-CM | POA: Diagnosis not present

## 2018-06-21 MED ORDER — CHLORTHALIDONE 25 MG PO TABS: 25.0000 mg | ORAL_TABLET | Freq: Every day | ORAL | 3 refills | Status: DC

## 2018-06-21 NOTE — Patient Instructions (Signed)
Medication Instructions:  1) INCREASE CHLORTHALIDONE to 25 mg daily.   Testing/Procedures: Dr. Burt Knack recommends you have an echo at our office in [place].  Follow-Up: You have been referred to Dr. Curt Bears for evaluation of atrial fibrillation.

## 2018-06-21 NOTE — Progress Notes (Signed)
Cardiology Office Note:    Date:  06/21/2018   ID:  Gregory Davila, DOB 1940/04/06, MRN [id number]  PCP:  [facility]  Cardiologist:  Tonny BollmanMichael Faline Langer, MD  Electrophysiologist:  None   Referring MD: [facility]   Chief Complaint  Patient presents with   Palpitations    History of Present Illness:    Gregory Davila is a 78 y.o. male with a hx of coronary artery disease and atrial fibrillation, presenting for follow-up evaluation.  The patient's last office year was in March 2019.  He underwent multivessel CABG in 2016 after presenting with non-STEMI.  He has been noted to have persistent atrial fibrillation, chronic systolic heart failure, hypertension, and chronic kidney disease.The patient was recently evaluated in the emergency department with heart palpitations and was initially noted to have atrial fibrillation with RVR.  His rate was controlled and he was discharged home from the emergency department on oral AV nodal blocking therapy.  He presents today for follow-up evaluation.  The patient is here alone today.  He reports 3 episodes of symptomatic atrial fibrillation in the last month, despite compliance with amiodarone use and goal dose beta-blockade.  He had an episode that lasted about 2 hours following a round of golf this week and another episode that occurred at rest.  He describes symptoms of "butterflies in his chest."  He denies shortness of breath or chest pain.  He complains of fatigue associated with his atrial fibrillation.  He has had no recent edema, orthopnea, or PND.  Past Medical History:  Diagnosis Date   Anginal pain (HCC)    CAD (coronary artery disease)    a, 2011: occluded LAD with left to right collaterals and moderate disease in the right coronary artery and circumflex, EF 50%. b. NSTEMI 07/2011 in setting of AF-RVR, MV abnl, cath w/ LAD 100%, RCA mod-severe dz, small vessel, med rx   Chronic renal insufficiency    followed by  primary care physician   Gout    Hypertension    long-standing   Myocardial infarction [facility](HCC)    Paroxysmal atrial fibrillation (HCC)    a. On Coumadin therapy/amidarone. b. 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   Pulmonary embolism [facility](HCC)     Past Surgical History:  Procedure Laterality Date   APPENDECTOMY     CARDIAC CATHETERIZATION  2011 & 2013   2013 [place], LAD 100%, CFX 50%, RCA 80%, small vessel, med rx   CORONARY ARTERY BYPASS GRAFT N/A 02/14/2014   Procedure: CORONARY ARTERY BYPASS GRAFTING (CABG)X4 LIMA-LAD; SVG-RCA(ENDARDERECTOMY); SVG-OM; SVG-DIAG;  Surgeon: Kerin PernaPeter Van Trigt, MD;  Location: [facility] OR;  Service: Open Heart Surgery;  Laterality: N/A;   LEFT HEART CATHETERIZATION WITH CORONARY ANGIOGRAM N/A 02/08/2014   Procedure: LEFT HEART CATHETERIZATION WITH CORONARY ANGIOGRAM;  Surgeon: Micheline ChapmanMichael D Keelin Sheridan, MD;  [facility];  Service: Cardiovascular;  Laterality: N/A;   NM MYOVIEW LTD  2013   Scar in the anteroseptal/periapical distribution with moderate peri-infarct ischemia, EF 43%   STERNAL INCISION RECLOSURE N/A 02/28/2014   Procedure: Irrigation and Debridement of Sternum with Sternal Rewiring;  Surgeon: Kerin PernaPeter Van Trigt, MD;  [facility];  Service: Thoracic;  Laterality: N/A;   TEE WITHOUT CARDIOVERSION N/A 02/14/2014   Procedure: TRANSESOPHAGEAL ECHOCARDIOGRAM (TEE);  Surgeon: Kerin PernaPeter Van Trigt, MD;  [facility];  Service: Open Heart Surgery;  Laterality: N/A;   TOTAL KNEE ARTHROPLASTY Bilateral     Current  Medications: Current Meds  Medication Sig   allopurinol (ZYLOPRIM) 300 MG tablet Take 0.5 tablets (150 mg total) by mouth daily.   amiodarone (PACERONE) 200 MG tablet Take 1 tablet (200 mg total) by mouth daily.   apixaban (ELIQUIS) 5 MG TABS tablet Take 1 tablet (5 mg total) by mouth 2 (two) times daily.   aspirin EC 81 MG tablet Take 81 mg by mouth daily.   atorvastatin (LIPITOR) 40 MG tablet Take 1  tablet (40 mg total) by mouth daily at 6 PM.   carvedilol (COREG) 12.5 MG tablet Take 1 tablet (12.5 mg total) by mouth 2 (two) times daily.   chlorthalidone (HYGROTON) 25 MG tablet Take 1 tablet (25 mg total) by mouth daily.   COLCRYS 0.6 MG tablet Take 1 tablet by mouth 2 (two) times daily as needed (gout).    diltiazem (CARDIZEM) 30 MG tablet Take 30 mg by mouth daily as needed.   fluticasone (FLONASE) 50 MCG/ACT nasal spray Place 1 spray into both nostrils daily as needed for allergies or rhinitis.   loratadine (CLARITIN) 10 MG tablet Take 10 mg by mouth daily.   multivitamin (THERAGRAN) per tablet Take 1 tablet by mouth daily.     [DISCONTINUED] chlorthalidone (HYGROTON) 25 MG tablet Take 0.5 tablets by mouth daily.     Allergies:   Azithromycin and Oxycodone   Social History   Socioeconomic History   Marital status: Married    Spouse name: Not on file   Number of children: Not on file   Years of education: Not on file   Highest education level: Not on file  Occupational History   Occupation: Retired  Scientist, product/process development strain: Not on file   Food insecurity    Worry: Not on file    Inability: Not on file   Transportation needs    Medical: Not on file    Non-medical: Not on file  Tobacco Use   Smoking status: Former Smoker    Types: Cigarettes    Quit date: 01/04/1966    Years since quitting: 52.4   Smokeless tobacco: Former Systems developer    Types: [place] date: 01/05/2007   Tobacco comment: quit chewing tobacco about 4 yrs ago  Substance and Sexual Activity   Alcohol use: Yes    Comment: "seldomly drink beer" 1 every 2-3 weeks   Drug use: No   Sexual activity: Yes  Lifestyle   Physical activity    Days per week: Not on file    Minutes per session: Not on file   Stress: Not on file  Relationships   Social connections    Talks on phone: Not on file    Gets together: Not on file    Attends religious service: Not on file     Active member of club or organization: Not on file    Attends meetings of clubs or organizations: Not on file    Relationship status: Not on file  Other Topics Concern   Not on file  Social History Narrative   Not on file     Family History: The patient's family history includes Cancer in his mother; Coronary artery disease in his brother; Heart attack in his mother; Prostate cancer in his father.  ROS:   Please see the history of present illness.    All other systems reviewed and are negative.  EKGs/Labs/Other Studies Reviewed:    The following studies were reviewed today: 2D echocardiogram 02/09/2014: Study Conclusions   -  Left ventricle: The cavity size was normal. Wall thickness was  increased in a pattern of moderate LVH. Systolic function was  mildly to moderately reduced. The estimated ejection fraction was  in the range of 40% to 45%. Septal hypokinesis. Doppler  parameters are consistent with abnormal left ventricular  relaxation (grade 1 diastolic dysfunction).  - Aortic valve: There was no stenosis. There was mild  regurgitation.  - Mitral valve: Mildly calcified annulus. Mildly calcified leaflets  . There was mild regurgitation.  - Right ventricle: The cavity size was normal. Systolic function  was normal.  - Pulmonary arteries: No complete TR doppler jet so unable to  estimate PA systolic pressure.  - Systemic veins: IVC measured 2.5 cm with no significant  respirophasic variation, estimate RA pressure 15 mmHg.   Impressions:   - Normal LV size with moderate LV hypertrophy. EF 40-45% with  septal hypokinesis. Normal RV size and systolic function. Mild  AI, mild MR.   EKG:  EKG is not ordered today.  The ekg from the emergency department is reviewed and demonstrates atrial fibrillation with RVR, ventricular rate 135 bpm, nonspecific ST change  Recent Labs: 05/17/2018: B Natriuretic Peptide 86.0; BUN 32; Creatinine, Ser 1.74;  Hemoglobin 16.7; Platelets 209; Potassium 3.7; Sodium 137  Recent Lipid Panel    Component Value Date/Time   CHOL 135 03/24/2017 1247   TRIG 94 03/24/2017 1247   HDL 41 03/24/2017 1247   CHOLHDL 3.3 03/24/2017 1247   CHOLHDL 3.3 04/01/2015 0739   VLDL 22 04/01/2015 0739   LDLCALC 75 03/24/2017 1247    Physical Exam:    VS:  BP 136/82    Pulse 67    Temp (!) 95 F (35 C)    Ht 5\' 11"  (1.803 m)    Wt 227 lb 3.2 oz (103.1 kg)    BMI 31.69 kg/m     Wt Readings from Last 3 Encounters:  06/21/18 227 lb 3.2 oz (103.1 kg)  05/17/18 220 lb (99.8 kg)  03/24/17 207 lb 9.6 oz (94.2 kg)     GEN:  Well nourished, well developed in no acute distress HEENT: Normal NECK: No JVD; No carotid bruits LYMPHATICS: No lymphadenopathy CARDIAC: RRR, no murmurs, rubs, gallops RESPIRATORY:  Clear to auscultation without rales, wheezing or rhonchi  ABDOMEN: Soft, non-tender, non-distended MUSCULOSKELETAL:  No edema; No deformity  SKIN: Warm and dry NEUROLOGIC:  Alert and oriented x 3 PSYCHIATRIC:  Normal affect   ASSESSMENT:    1. Paroxysmal atrial fibrillation (HCC)   2. Coronary artery disease involving native coronary artery of native heart without angina pectoris   3. Essential hypertension    PLAN:    In order of problems listed above:  1. Paroxysmal atrial fibrillation: The patient is maintained on amiodarone and carvedilol.  He has had mild to moderate LV systolic dysfunction in the past, last echocardiogram in 2016.  He is tolerating oral anticoagulation with apixaban without bleeding problems.  I reviewed recent labs which demonstrated an elevated TSH but normal free T4.  LFTs have been normal.  The patient has chronic kidney disease which is stable.  We discussed further treatment options for atrial fibrillation.  His last echocardiogram in 2016 demonstrated no significant valvular disease and his left atrial size was normal.  I recommended an updated echocardiogram.  I recommended a  formal EP referral for consideration of atrial fib ablation.  We discussed the role of obesity with respect to risk of recurrent atrial fibrillation.  He is  motivated to work on weight loss, diet, and exercise. 2. The patient's coronary artery disease appears to be stable following history of coronary bypass surgery. 3. Blood pressures are reviewed from home.  Diastolic blood pressures are in a good range.  Systolic blood pressures are in the 130-140s.  I recommended increasing chlorthalidone to 25 mg daily.   Medication Adjustments/Labs and Tests Ordered: Current medicines are reviewed at length with the patient today.  Concerns regarding medicines are outlined above.  Orders Placed This Encounter  Procedures   Ambulatory referral to Cardiac Electrophysiology   ECHOCARDIOGRAM COMPLETE   Meds ordered this encounter  Medications   chlorthalidone (HYGROTON) 25 MG tablet    Sig: Take 1 tablet (25 mg total) by mouth daily.    Dispense:  90 tablet    Refill:  3    Patient Instructions  Medication Instructions:  1) INCREASE CHLORTHALIDONE to 25 mg daily.   Testing/Procedures: Dr. Excell Seltzerooper recommends you have an echo at our office in [place].  Follow-Up: You have been referred to Dr. Elberta Fortisamnitz for evaluation of atrial fibrillation.     Signed, Tonny BollmanMichael Loudon Krakow, MD  06/21/2018 5:57 PM    [facility]

## 2018-07-04 ENCOUNTER — Other Ambulatory Visit: Payer: Self-pay | Admitting: *Deleted

## 2018-07-04 ENCOUNTER — Ambulatory Visit (INDEPENDENT_AMBULATORY_CARE_PROVIDER_SITE_OTHER): Payer: Medicare Other | Admitting: *Deleted

## 2018-07-04 ENCOUNTER — Other Ambulatory Visit: Payer: Self-pay

## 2018-07-04 DIAGNOSIS — Z5181 Encounter for therapeutic drug level monitoring: Secondary | ICD-10-CM

## 2018-07-04 DIAGNOSIS — I480 Paroxysmal atrial fibrillation: Secondary | ICD-10-CM

## 2018-07-04 DIAGNOSIS — I4891 Unspecified atrial fibrillation: Secondary | ICD-10-CM | POA: Diagnosis not present

## 2018-07-04 DIAGNOSIS — I10 Essential (primary) hypertension: Secondary | ICD-10-CM

## 2018-07-04 NOTE — Progress Notes (Signed)
Pt requested to change from Warfarin to Eliquis.  Pt took last dose of warfarin on 05/22/18 and started Eliquis 5mg  twice daily for PAF on 05/24/18.    Labs on 05/17/18:  SCr 1.74, Hgb 16.7, Hct 49.2  Plts 209  Reviewed patients medication list.  Pt is not currently on any combined P-gp and strong CYP3A4 inhibitors/inducers (ketoconazole, traconazole, ritonavir, carbamazepine, phenytoin, rifampin, St. John's wort).  Reviewed labs from 07/04/18:  SCr 1.98, Weight 103.1kg CrCl 45.56.  Dose is appropriate based on age, weight, and SCr.  Hgb and HCT: 15.5/46.2  A full discussion of the nature of anticoagulants has been carried out.  A benefit/risk analysis has been presented to the patient, so that they understand the justification for choosing anticoagulation with Eliquis at this time.  The need for compliance is stressed.  Pt is aware to take the medication twice daily.  Side effects of potential bleeding are discussed, including unusual colored urine or stools, coughing up blood or coffee ground emesis, nose bleeds or serious fall or head trauma.  Discussed signs and symptoms of stroke. The patient should avoid any OTC items containing aspirin or ibuprofen.  Avoid alcohol consumption.   Call if any signs of abnormal bleeding.  Discussed financial obligations and resolved any difficulty in obtaining medication.  Next lab test in 6 months.   Discussed lab results with patient.  SCr slightly elevated from 1 month ago.  Copies of labs faxed to [facility] [place])   Placed in recall for 6 month follow up.  Pt verbalized understanding.

## 2018-07-05 LAB — CBC
HCT: 46.2 % (ref 38.5–50.0)
Hemoglobin: 15.5 g/dL (ref 13.2–17.1)
MCH: 30.9 pg (ref 27.0–33.0)
MCHC: 33.5 g/dL (ref 32.0–36.0)
MCV: 92 fL (ref 80.0–100.0)
MPV: 10.3 fL (ref 7.5–12.5)
Platelets: 236 10*3/uL (ref 140–400)
RBC: 5.02 10*6/uL (ref 4.20–5.80)
RDW: 13.7 % (ref 11.0–15.0)
WBC: 7.3 10*3/uL (ref 3.8–10.8)

## 2018-07-05 LAB — BASIC METABOLIC PANEL
BUN/Creatinine Ratio: 16 (calc) (ref 6–22)
BUN: 32 mg/dL — ABNORMAL HIGH (ref 7–25)
CO2: 31 mmol/L (ref 20–32)
Calcium: 9.6 mg/dL (ref 8.6–10.3)
Chloride: 99 mmol/L (ref 98–110)
Creat: 1.98 mg/dL — ABNORMAL HIGH (ref 0.70–1.18)
Glucose, Bld: 111 mg/dL (ref 65–139)
Potassium: 3.9 mmol/L (ref 3.5–5.3)
Sodium: 139 mmol/L (ref 135–146)

## 2018-07-13 ENCOUNTER — Other Ambulatory Visit: Payer: Self-pay

## 2018-07-13 ENCOUNTER — Telehealth: Payer: Self-pay

## 2018-07-13 ENCOUNTER — Ambulatory Visit (INDEPENDENT_AMBULATORY_CARE_PROVIDER_SITE_OTHER): Payer: Medicare Other

## 2018-07-13 DIAGNOSIS — I480 Paroxysmal atrial fibrillation: Secondary | ICD-10-CM | POA: Diagnosis not present

## 2018-07-13 DIAGNOSIS — I10 Essential (primary) hypertension: Secondary | ICD-10-CM

## 2018-07-13 NOTE — Telephone Encounter (Signed)
-----   Message from Sherren Mocha, MD sent at 07/12/2018  8:10 AM EDT ----- Creatinine is a little increased from baseline.  Okay to continue current therapy, push fluids, and repeat labs in 1 month.  Thanks

## 2018-07-13 NOTE — Telephone Encounter (Signed)
Reviewed results with patient who verbalized understanding.   Encouraged him to drink plenty of water. Order placed for BMET to be drawn at the end of the month at [facility]. The patient was grateful for assistance and agrees with treatment plan.

## 2018-07-21 ENCOUNTER — Other Ambulatory Visit: Payer: Self-pay

## 2018-07-21 ENCOUNTER — Ambulatory Visit (INDEPENDENT_AMBULATORY_CARE_PROVIDER_SITE_OTHER): Payer: Medicare Other | Admitting: Cardiology

## 2018-07-21 ENCOUNTER — Encounter: Payer: Self-pay | Admitting: Cardiology

## 2018-07-21 VITALS — BP 124/64 | HR 63 | Ht 71.0 in | Wt 226.0 lb

## 2018-07-21 DIAGNOSIS — M542 Cervicalgia: Secondary | ICD-10-CM | POA: Insufficient documentation

## 2018-07-21 DIAGNOSIS — N183 Chronic kidney disease, stage 3 unspecified: Secondary | ICD-10-CM | POA: Insufficient documentation

## 2018-07-21 DIAGNOSIS — I4819 Other persistent atrial fibrillation: Secondary | ICD-10-CM

## 2018-07-21 DIAGNOSIS — I2510 Atherosclerotic heart disease of native coronary artery without angina pectoris: Secondary | ICD-10-CM | POA: Insufficient documentation

## 2018-07-21 NOTE — Progress Notes (Signed)
Electrophysiology Office Note   Date:  07/21/2018   ID:  Gregory Davila, DOB December 13, 1940, MRN [id number]  [facility]  [facility]  Cardiologist:  Burt Knack Primary Electrophysiologist:  Erving Sassano Meredith Leeds, MD    No chief complaint on file.    History of Present Illness: Gregory Davila is a 78 y.o. male who is being seen today for the evaluation of AF at the request of [place]. Presenting today for electrophysiology evaluation.  He has a history significant for multivessel CABG in 2016 after presenting with non-STEMI.  He also has persistent atrial fibrillation, chronic systolic heart failure, hypertension and CKD.  He presented in the emergency room recently with atrial fibrillation and rapid rates.  He was rate controlled and discharged home on oral AV nodal blocking agents.  He has had 3 episodes of symptomatic atrial fibrillation in the last month despite amiodarone compliance.  He had an episode that lasted approximately 2 hours following a round of golf.  He had butterflies in his chest.  Today, he denies symptoms of palpitations, chest pain, shortness of breath, orthopnea, PND, lower extremity edema, claudication, dizziness, presyncope, syncope, bleeding, or neurologic sequela. The patient is tolerating medications without difficulties.    Past Medical History:  Diagnosis Date  . Anginal pain ([place])   . CAD (coronary artery disease)    a, 2011: occluded LAD with left to right collaterals and moderate disease in the right coronary artery and circumflex, EF 50%. b. NSTEMI 07/2011 in setting of AF-RVR, MV abnl, cath w/ LAD 100%, RCA mod-severe dz, small vessel, med rx  . Chronic renal insufficiency    followed by primary care physician  . Gout   . Hypertension    long-standing  . Myocardial infarction ([place])   . Paroxysmal atrial fibrillation ([place])    a. On Coumadin therapy/amidarone. b. 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  .  Pulmonary embolism [facility])    Past Surgical History:  Procedure Laterality Date  . APPENDECTOMY    . CARDIAC CATHETERIZATION  2011 & 2013   [place], LAD 100%, CFX 50%, RCA 80%, small vessel, med rx  . CORONARY ARTERY BYPASS GRAFT N/A 02/14/2014   Procedure: CORONARY ARTERY BYPASS GRAFTING (CABG)X4 LIMA-LAD; SVG-RCA(ENDARDERECTOMY); SVG-OM; SVG-DIAG;  Surgeon: Ivin Poot, MD;  Location: [place];  Service: Open Heart Surgery;  Laterality: N/A;  . LEFT HEART CATHETERIZATION WITH CORONARY ANGIOGRAM N/A 02/08/2014   Procedure: LEFT HEART CATHETERIZATION WITH CORONARY ANGIOGRAM;  Surgeon: Blane Ohara, MD;  Location: [facility] CATH LAB;  Service: Cardiovascular;  Laterality: N/A;  . NM MYOVIEW LTD  2013   Scar in the anteroseptal/periapical distribution with moderate peri-infarct ischemia, EF 43%  . STERNAL INCISION RECLOSURE N/A 02/28/2014   Procedure: Irrigation and Debridement of Sternum with Sternal Rewiring;  Surgeon: Ivin Poot, MD;  Location: [place];  Service: Thoracic;  Laterality: N/A;  . TEE WITHOUT CARDIOVERSION N/A 02/14/2014   Procedure: TRANSESOPHAGEAL ECHOCARDIOGRAM (TEE);  Surgeon: Ivin Poot, MD;  Location: [place];  Service: Open Heart Surgery;  Laterality: N/A;  . TOTAL KNEE ARTHROPLASTY Bilateral      Current Outpatient Medications  Medication Sig Dispense Refill  . allopurinol (ZYLOPRIM) 300 MG tablet Take 0.5 tablets (150 mg total) by mouth daily. 30 tablet 1  . amiodarone (PACERONE) 200 MG tablet Take 1 tablet (200 mg total) by mouth daily. 90 tablet 3  . apixaban (ELIQUIS) 5 MG TABS tablet  Take 1 tablet (5 mg total) by mouth 2 (two) times daily. 60 tablet 5  . aspirin EC 81 MG tablet Take 81 mg by mouth daily.    [name]. atorvastatin (LIPITOR) 40 MG tablet Take 1 tablet (40 mg total) by mouth daily at 6 PM. 30 tablet 1  . carvedilol (COREG) 12.5 MG tablet Take 1 tablet (12.5 mg total) by mouth 2 (two) times daily. 180 tablet 3  . chlorthalidone (HYGROTON) 25 MG tablet  Take 1 tablet (25 mg total) by mouth daily. 90 tablet 3  . COLCRYS 0.6 MG tablet Take 1 tablet by mouth 2 (two) times daily as needed (gout).     [name]. diltiazem (CARDIZEM) 30 MG tablet Take 30 mg by mouth daily as needed.    . fluticasone (FLONASE) 50 MCG/ACT nasal spray Place 1 spray into both nostrils daily as needed for allergies or rhinitis.    [name]. loratadine (CLARITIN) 10 MG tablet Take 10 mg by mouth daily.    . multivitamin (THERAGRAN) per tablet Take 1 tablet by mouth daily.      . nitroGLYCERIN (NITROSTAT) 0.4 MG SL tablet Place 1 tablet (0.4 mg total) under the tongue every 5 (five) minutes x 3 doses as needed for chest pain. 25 tablet 2   No current facility-administered medications for this visit.     Allergies:   Azithromycin and Oxycodone   Social History:  The patient  reports that he quit smoking about 52 years ago. His smoking use included cigarettes. He quit smokeless tobacco use about 11 years ago.  His smokeless tobacco use included chew. He reports current alcohol use. He reports that he does not use drugs.   Family History:  The patient's family history includes Cancer in his mother; Coronary artery disease in his brother; Heart attack in his mother; Prostate cancer in his father.    ROS:  Please see the history of present illness.   Otherwise, review of systems is positive for none.   All other systems are reviewed and negative.    PHYSICAL EXAM: VS:  BP 124/64   Pulse 63   Ht 5\' 11"  (1.803 m)   Wt 226 lb (102.5 kg)   BMI 31.52 kg/m  , BMI Body mass index is 31.52 kg/m. GEN: Well nourished, well developed, in no acute distress  HEENT: normal  Neck: no JVD, carotid bruits, or masses Cardiac: RRR; no murmurs, rubs, or gallops,no edema  Respiratory:  clear to auscultation bilaterally, normal work of breathing GI: soft, nontender, nondistended, + BS MS: no deformity or atrophy  Skin: warm and dry Neuro:  Strength and sensation are intact Psych: euthymic mood, full  affect  EKG:  EKG is ordered today. Personal review of the ekg ordered shows sinus rhythm  Recent Labs: 05/17/2018: B Natriuretic Peptide 86.0 07/04/2018: BUN 32; Creat 1.98; Hemoglobin 15.5; Platelets 236; Potassium 3.9; Sodium 139    Lipid Panel     Component Value Date/Time   CHOL 135 03/24/2017 1247   TRIG 94 03/24/2017 1247   HDL 41 03/24/2017 1247   CHOLHDL 3.3 03/24/2017 1247   CHOLHDL 3.3 04/01/2015 0739   VLDL 22 04/01/2015 0739   LDLCALC 75 03/24/2017 1247     Wt Readings from Last 3 Encounters:  07/21/18 226 lb (102.5 kg)  06/21/18 227 lb 3.2 oz (103.1 kg)  05/17/18 220 lb (99.8 kg)      Other studies Reviewed: Additional studies/ records that were reviewed today include: TTE 07/13/18  Review of  the above records today demonstrates:   1. The left ventricle has normal systolic function, with an ejection fraction of 55-60%. The cavity size was normal. There is mild concentric left ventricular hypertrophy. Left ventricular diastolic Doppler parameters are consistent with impaired  relaxation. No evidence of left ventricular regional wall motion abnormalities.  2. The right ventricle has mildly reduced systolic function. The cavity was normal. There is mildly increased right ventricular wall thickness.  3. The mitral valve is degenerative. Mild thickening of the mitral valve leaflet. There is mild mitral annular calcification present.  4. The tricuspid valve is grossly normal.  5. The aortic valve is tricuspid. Mild thickening of the aortic valve. Moderate calcification of the aortic valve. Aortic valve regurgitation is moderate by color flow Doppler. Mild stenosis of the aortic valve.  6. Severe aortic annular calcification.  7. There is mild dilatation of the aortic root measuring 40 mm.   ASSESSMENT AND PLAN:  1.  Persistent atrial fibrillation: Currently on amiodarone and Coreg.  Anticoagulation with Eliquis.  He has been referred for evaluation of ablation.  Risks  and benefits were discussed and include bleeding, tamponade, heart block, stroke, damage to surrounding organs.  The patient understands the risks and is agreed to the procedure.  This patients CHA2DS2-VASc Score and unadjusted Ischemic Stroke Rate (% per year) is equal to 4.8 % stroke rate/year from a score of 4  Above score calculated as 1 point each if present [CHF, HTN, DM, Vascular=MI/PAD/Aortic Plaque, Age if 65-74, or Male] Above score calculated as 2 points each if present [Age > 75, or Stroke/TIA/TE]   2.  Coronary artery disease: Status post CABG.  No current chest pain.  3.  Hypertension: Currently well controlled    Current medicines are reviewed at length with the patient today.   The patient does not have concerns regarding his medicines.  The following changes were made today:  none  Labs/ tests ordered today include:  Orders Placed This Encounter  Procedures  . EKG 12-Lead   Case discussed with primary cardiology  Disposition:   FU with Clova Morlock 3 months  Signed, Maanasa Aderhold Jorja LoaMartin Jiovany Scheffel, MD  07/21/2018 10:27 AM     [facility] [address] [place] [address] [phone] (office) [phone] (fax)

## 2018-07-21 NOTE — Patient Instructions (Signed)
Medication Instructions:  Your physician recommends that you continue on your current medications as directed. Please refer to the Current Medication list given to you today.  *If you need a refill on your cardiac medications before your next appointment, please call your pharmacy.  Labwork: You will get lab work within 30 days of your procedure:  BMP & CBC *Will notify you of abnormal results, otherwise continue current treatment plan.  Testing/Procedures: Your physician has requested that you have cardiac CT within 7 days prior to your ablation. Cardiac computed tomography (CT) is a painless test that uses an x-ray machine to take clear, detailed pictures of your heart. For further information please visit HugeFiesta.tn. Please follow instruction below located under special instructions. You will get a call from our office to schedule the date for this test.  Your physician has recommended that you have an ablation. Catheter ablation is a medical procedure used to treat some cardiac arrhythmias (irregular heartbeats). During catheter ablation, a long, thin, flexible tube is put into a blood vessel in your groin (upper thigh), or neck. This tube is called an ablation catheter. It is then guided to your heart through the blood vessel. Radio frequency waves destroy small areas of heart tissue where abnormal heartbeats may cause an arrhythmia to start. Please see the instructions below located under special instructions  Follow-Up: Your physician recommends that you schedule a follow-up appointment in: 4 weeks, after your procedure on ______, with Roderic Palau NP in the AFib clinic.  Your physician recommends that you schedule a follow up appointment in: 3 months, after your procedure on _______, with Dr. Curt Bears.  *Please note that any paperwork needing to be filled out by the provider will need to be addressed at the front desk prior to seeing the provider. Please note that any FMLA,  disability or other documents regarding health condition is subject to a $25.00 charge that must be received prior to completion of paperwork in the form of a money order or check.  Thank you for choosing [facility]!! Trinidad Curet, RN [phone]   Any Other Special Instructions Will Be Listed Below      CARDIAC CT INSTRUCTIONS:  Please arrive at the [facility] main entrance of [facility] at _______ AM (30-45 minutes prior to test start time) [facility] [address] [place], [place] [address] [phone]  Proceed to the [facility] Radiology Department (First Floor).  Please follow these instructions carefully (unless otherwise directed):  Hold all erectile dysfunction medications at least 48 hours prior to test.  On the Night Before the Test: . Drink plenty of water. . Do not consume any caffeinated/decaffeinated beverages or chocolate 12 hours prior to your test. . Do not take any antihistamines 12 hours prior to your test. . If you take Metformin do not take 24 hours prior to test. . If the patient has contrast allergy: ? Patient will need a prescription for Prednisone and very clear instructions (as follows): 1. Prednisone 50 mg - take 13 hours prior to test 2. Take another Prednisone 50 mg 7 hours prior to test 3. Take another Prednisone 50 mg 1 hour prior to test 4. Take Benadryl 50 mg 1 hour prior to test . Patient must complete all four doses of above prophylactic medications . Patient will need a ride after test due to Benadryl.  On the Day of the Test: . Drink plenty of water. Do not drink any water within one hour of  the test. . Do not eat any food 4 hours prior to the test. . You may take your regular medications prior to the test. . IF NOT ON A BETA BLOCKER - Take 50 mg of Lopressor (Metoprolol) one hour before the test.   . HOLD Furosemide morning of the test.  After the Test: . Drink plenty of water. . After receiving  IV contrast, you may experience a mild flushed feeling. This is normal. . On occasion, you may experience a mild rash up to 24 hours after the test. This is not dangerous. If this occurs, you can take Benadryl 25 mg and increase your fluid intake. . If you experience trouble breathing, this can be serious. If it is severe call 911 IMMEDIATELY. If it is mild, please call our office. . If you take any of these medications: Glipizide/Metformin, Avandament, Glucavance, please do not take 48 hours after completing test.    Instructions for your ablation: 1. [facility] at _____ on ______. 2. Do not eat or drink after midnight the night prior to the procedure. 3. Do not miss any doses of ELIQUIS prior to the morning of the procedure.  4. Do not take any medications the morning of the procedure. 5. Both of your groins will need to be shaved for this procedure (if needed). We ask that you do this yourself at home 1-2 days prior to the procedure.  If you are unable/uncomfortable to do yourself, the hospital staff will shave you the day of your procedure (if needed). 6. Plan for an overnight stay in the hospital. 7. You will need someone to drive you home at discharge.

## 2018-08-10 ENCOUNTER — Telehealth: Payer: Self-pay | Admitting: Cardiology

## 2018-08-10 DIAGNOSIS — I4819 Other persistent atrial fibrillation: Secondary | ICD-10-CM

## 2018-08-10 DIAGNOSIS — Z01812 Encounter for preprocedural laboratory examination: Secondary | ICD-10-CM

## 2018-08-10 NOTE — Telephone Encounter (Signed)
  Patient is calling to check on when he will be able to get the CT done that Dr Curt Bears wanted. Did not see orders for CT

## 2018-08-10 NOTE — Telephone Encounter (Signed)
Pt aware CT will be arranged once procedure date can be scheduled.  Pt aware I will call him at later time to arrange procedure for September if available.

## 2018-08-25 ENCOUNTER — Encounter: Payer: Self-pay | Admitting: Cardiology

## 2018-08-25 NOTE — Telephone Encounter (Signed)
Patient called again about CT scan, stated you were going to call him the first of the week, he still hasn't heard back from you.

## 2018-08-25 NOTE — Telephone Encounter (Signed)
error 

## 2018-08-25 NOTE — Telephone Encounter (Signed)
Advised pt that Gregory Davila has his information about his procedure and his CT.. she is out of the office today but will be calling him soon to get everything worked out for him.. he verbalized understanding and agreed.. will forward to [facility] for review.

## 2018-08-31 NOTE — Telephone Encounter (Signed)
Pt would like procedure on 9/16.  Aware I will contact him soon to go over instructions and office would contact him to arrange CT testing prior. Patient verbalized understanding and agreeable to plan.

## 2018-09-04 NOTE — Addendum Note (Signed)
Addended by: Stanton Kidney on: 09/04/2018 04:05 PM   Modules accepted: Orders

## 2018-09-06 ENCOUNTER — Encounter: Payer: Self-pay | Admitting: *Deleted

## 2018-09-06 NOTE — Telephone Encounter (Signed)
Reviewed procedure instructions w/ pt. Pt will stop by [facility] office tomorrow and pick up instruction letter. He will stop by [facility] tomorrow for labs. Aware office will contact him to arrange post procedure follow up. Patient verbalized understanding and agreeable to plan.

## 2018-09-06 NOTE — Addendum Note (Signed)
Addended by: Stanton Kidney on: 09/06/2018 11:42 AM   Modules accepted: Orders

## 2018-09-07 ENCOUNTER — Other Ambulatory Visit: Payer: Self-pay | Admitting: Cardiology

## 2018-09-07 ENCOUNTER — Telehealth: Payer: Self-pay | Admitting: *Deleted

## 2018-09-07 LAB — BASIC METABOLIC PANEL WITH GFR
BUN/Creatinine Ratio: 18 (calc) (ref 6–22)
BUN: 36 mg/dL — ABNORMAL HIGH (ref 7–25)
CO2: 27 mmol/L (ref 20–32)
Calcium: 9.4 mg/dL (ref 8.6–10.3)
Chloride: 103 mmol/L (ref 98–110)
Creat: 2.02 mg/dL — ABNORMAL HIGH (ref 0.70–1.18)
GFR, Est African American: 36 mL/min/{1.73_m2} — ABNORMAL LOW (ref >=60)
GFR, Est Non African American: 31 mL/min/{1.73_m2} — ABNORMAL LOW (ref >=60)
Glucose, Bld: 91 mg/dL (ref 65–99)
Potassium: 3.9 mmol/L (ref 3.5–5.3)
Sodium: 138 mmol/L (ref 135–146)

## 2018-09-07 LAB — CBC
HCT: 45.1 % (ref 38.5–50.0)
Hemoglobin: 15.2 g/dL (ref 13.2–17.1)
MCH: 31.1 pg (ref 27.0–33.0)
MCHC: 33.7 g/dL (ref 32.0–36.0)
MCV: 92.2 fL (ref 80.0–100.0)
MPV: 10.5 fL (ref 7.5–12.5)
Platelets: 221 10*3/uL (ref 140–400)
RBC: 4.89 10*6/uL (ref 4.20–5.80)
RDW: 13 % (ref 11.0–15.0)
WBC: 6.6 10*3/uL (ref 3.8–10.8)

## 2018-09-07 NOTE — Telephone Encounter (Signed)
Virtual Visit Pre-Appointment Phone Call  "(Name), I am calling you today to discuss your upcoming appointment. We are currently trying to limit exposure to the virus that causes COVID-19 by seeing patients at home rather than in the office."  1. "What is the BEST phone number to call the day of the visit?" - include this in appointment notes  2. "Do you have or have access to (through a family member/friend) a smartphone with video capability that we can use for your visit?" a. If yes - list this number in appt notes as "cell" (if different from BEST phone #) and list the appointment type as a VIDEO visit in appointment notes b. If no - list the appointment type as a PHONE visit in appointment notes  3. Confirm consent - "In the setting of the current Covid19 crisis, you are scheduled for a (phone or video) visit with your provider on (date) at (time).  Just as we do with many in-office visits, in order for you to participate in this visit, we must obtain consent.  If you'd like, I can send this to your mychart (if signed up) or email for you to review.  Otherwise, I can obtain your verbal consent now.  All virtual visits are billed to your insurance company just like a normal visit would be.  By agreeing to a virtual visit, we'd like you to understand that the technology does not allow for your provider to perform an examination, and thus may limit your provider's ability to fully assess your condition. If your provider identifies any concerns that need to be evaluated in person, we will make arrangements to do so.  Finally, though the technology is pretty good, we cannot assure that it will always work on either your or our end, and in the setting of a video visit, we may have to convert it to a phone-only visit.  In either situation, we cannot ensure that we have a secure connection.  Are you willing to proceed?" STAFF: Did the patient verbally acknowledge consent to telehealth visit? Document  YES/NO here: YES  4. Advise patient to be prepared - "Two hours prior to your appointment, go ahead and check your blood pressure, pulse, oxygen saturation, and your weight (if you have the equipment to check those) and write them all down. When your visit starts, your provider will ask you for this information. If you have an Apple Watch or Kardia device, please plan to have heart rate information ready on the day of your appointment. Please have a pen and paper handy nearby the day of the visit as well."  5. Give patient instructions for MyChart download to smartphone OR Doximity/Doxy.me as below if video visit (depending on what platform provider is using)  6. Inform patient they will receive a phone call 15 minutes prior to their appointment time (may be from unknown caller ID) so they should be prepared to answer    TELEPHONE CALL NOTE  Gregory Davila has been deemed a candidate for a follow-up tele-health visit to limit community exposure during the Covid-19 pandemic. I spoke with the patient via phone to ensure availability of phone/video source, confirm preferred email & phone number, and discuss instructions and expectations.  I reminded Gregory Davila to be prepared with any vital sign and/or heart rhythm information that could potentially be obtained via home monitoring, at the time of his visit. I reminded Gregory Davila to expect a phone call prior to  his visit.  Baird LyonsPrice, Audreana Hancox L, RN 09/07/2018 12:48 PM   INSTRUCTIONS FOR DOWNLOADING THE MYCHART APP TO SMARTPHONE  - The patient must first make sure to have activated MyChart and know their login information - If Apple, go to [employer] Store and type in MyChart in the search bar and download the app. If Android, ask patient to go to [employer] Play Store and type in PiffardMyChart in the search bar and download the app. The app is free but as with any other app downloads, their phone may require them to verify saved payment information or Apple/Android  password.  - The patient will need to then log into the app with their MyChart username and password, and select [facility] as their healthcare provider to link the account. When it is time for your visit, go to the MyChart app, find appointments, and click Begin Video Visit. Be sure to Select Allow for your device to access the Microphone and Camera for your visit. You will then be connected, and your provider will be with you shortly.  **If they have any issues connecting, or need assistance please contact MyChart service desk [phone])**  **If using a computer, in order to ensure the best quality for their visit they will need to use either of the following Internet Browsers: [employer] Edge, or Google Chrome**  IF USING DOXIMITY or DOXY.ME - The patient will receive a link just prior to their visit by text.     FULL LENGTH CONSENT FOR TELE-HEALTH VISIT   I hereby voluntarily request, consent and authorize [facility] and its employed or contracted physicians, physician assistants, nurse practitioners or other licensed health care professionals (the Practitioner), to provide me with telemedicine health care services (the "Services") as deemed necessary by the treating Practitioner. I acknowledge and consent to receive the Services by the Practitioner via telemedicine. I understand that the telemedicine visit will involve communicating with the Practitioner through live audiovisual communication technology and the disclosure of certain medical information by electronic transmission. I acknowledge that I have been given the opportunity to request an in-person assessment or other available alternative prior to the telemedicine visit and am voluntarily participating in the telemedicine visit.  I understand that I have the right to withhold or withdraw my consent to the use of telemedicine in the course of my care at any time, without affecting my right to future care or treatment,  and that the Practitioner or I may terminate the telemedicine visit at any time. I understand that I have the right to inspect all information obtained and/or recorded in the course of the telemedicine visit and may receive copies of available information for a reasonable fee.  I understand that some of the potential risks of receiving the Services via telemedicine include:  [name]. Delay or interruption in medical evaluation due to technological equipment failure or disruption; . Information transmitted may not be sufficient (e.g. poor resolution of images) to allow for appropriate medical decision making by the Practitioner; and/or  . In rare instances, security protocols could fail, causing a breach of personal health information.  Furthermore, I acknowledge that it is my responsibility to provide information about my medical history, conditions and care that is complete and accurate to the best of my ability. I acknowledge that Practitioner's advice, recommendations, and/or decision may be based on factors not within their control, such as incomplete or inaccurate data provided by me or distortions of diagnostic images or specimens that may result from electronic transmissions.  I understand that the practice of medicine is not an exact science and that Practitioner makes no warranties or guarantees regarding treatment outcomes. I acknowledge that I will receive a copy of this consent concurrently upon execution via email to the email address I last provided but may also request a printed copy by calling the office of [place].    I understand that my insurance will be billed for this visit.   I have read or had this consent read to me. . I understand the contents of this consent, which adequately explains the benefits and risks of the Services being provided via telemedicine.  . I have been provided ample opportunity to ask questions regarding this consent and the Services and have had my questions  answered to my satisfaction. . I give my informed consent for the services to be provided through the use of telemedicine in my medical care  By participating in this telemedicine visit I agree to the above.

## 2018-09-12 ENCOUNTER — Telehealth (INDEPENDENT_AMBULATORY_CARE_PROVIDER_SITE_OTHER): Payer: Medicare Other | Admitting: Cardiology

## 2018-09-12 ENCOUNTER — Other Ambulatory Visit: Payer: Self-pay

## 2018-09-12 ENCOUNTER — Encounter: Payer: Self-pay | Admitting: Cardiology

## 2018-09-12 DIAGNOSIS — I4891 Unspecified atrial fibrillation: Secondary | ICD-10-CM | POA: Diagnosis not present

## 2018-09-12 NOTE — Progress Notes (Signed)
Electrophysiology TeleHealth Note   Due to national recommendations of social distancing due to COVID 19, an audio/video telehealth visit is felt to be most appropriate for this patient at this time.  See Epic message for the patient's consent to telehealth for [facility].   Date:  09/12/2018   ID:  [name], DOB [birth year], MRN [id number]  Location: patient's home  Provider location: [address] [place]  Evaluation Performed: Follow-up visit  [facility]  [facility]  Cardiologist:  Tonny Bollman, MD  Electrophysiologist:  Dr Elberta Fortis  Chief Complaint:  AF  History of Present Illness:    Gregory Davila is a 78 y.o. male who presents via audio/video conferencing for a telehealth visit today.  Since last being seen in our clinic, the patient reports doing very well.  Today, he denies symptoms of palpitations, chest pain, shortness of breath,  lower extremity edema, dizziness, presyncope, or syncope.  The patient is otherwise without complaint today.  The patient denies symptoms of fevers, chills, cough, or new SOB worrisome for COVID 19.  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  Also has persistent atrial fibrillation, chronic systolic heart failure, hypertension, and CKD.  He is currently on amiodarone for his atrial fibrillation.  Today, denies symptoms of palpitations, chest pain, shortness of breath, orthopnea, PND, lower extremity edema, claudication, dizziness, presyncope, syncope, bleeding, or neurologic sequela. The patient is tolerating medications without difficulties.  He did have one episode of atrial fibrillation 1 week ago.  It lasted for approximately 1 hour.  Aside from that he is done well without atrial fibrillation since her last visit.  Past Medical History:  Diagnosis Date  . Anginal pain (HCC)   . CAD (coronary artery disease)    a, 2011: occluded LAD with left to right collaterals and  moderate disease in the right coronary artery and circumflex, EF 50%. b. NSTEMI 07/2011 in setting of AF-RVR, MV abnl, cath w/ LAD 100%, RCA mod-severe dz, small vessel, med rx  . Chronic renal insufficiency    followed by primary care physician  . Gout   . Hypertension    long-standing  . Myocardial infarction (HCC)   . Paroxysmal atrial fibrillation (HCC)    a. On Coumadin therapy/amidarone. b. 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  . Pulmonary embolism [facility])     Past Surgical History:  Procedure Laterality Date  . APPENDECTOMY    . CARDIAC CATHETERIZATION  2011 & 2013   2013 L main [place], LAD 100%, CFX 50%, RCA 80%, small vessel, med rx  . CORONARY ARTERY BYPASS GRAFT N/A 02/14/2014   Procedure: CORONARY ARTERY BYPASS GRAFTING (CABG)X4 LIMA-LAD; SVG-RCA(ENDARDERECTOMY); SVG-OM; SVG-DIAG;  Surgeon: Kerin Perna, MD;  Location: [facility] OR;  Service: Open Heart Surgery;  Laterality: N/A;  . LEFT HEART CATHETERIZATION WITH CORONARY ANGIOGRAM N/A 02/08/2014   Procedure: LEFT HEART CATHETERIZATION WITH CORONARY ANGIOGRAM;  Surgeon: Micheline Chapman, MD;  Location[facility] C[facility] LAB;  Service: Cardiovascular;  Laterality: N/A;  . NM MYOVIEW LTD  2013   Scar in the anteroseptal/periapical distribution with moderate peri-infarct ischemia, EF 43%  . STERNAL INCISION RECLOSURE N/A 02/28/2014   Procedure: Irrigation and Debridement of Sternum with Sternal Rewiring;  Surgeon: Kerin Perna, MD;  Location: [facility] OR;  Service: Thoracic;  Laterality: N/A;  . TEE WITHOUT CARDIOVERSION N/A 02/14/2014   Procedure: TRANSESOPHAGEAL ECHOCARDIOGRAM (TEE);  Surgeon: Kathlee Nations Trigt,  MD;  Location: [facility] OR;  Service: Open Heart Surgery;  Laterality: N/A;  . TOTAL KNEE ARTHROPLASTY Bilateral     Current Outpatient Medications  Medication Sig Dispense Refill  . allopurinol (ZYLOPRIM) 300 MG tablet Take 0.5 tablets (150 mg total) by mouth daily. 30 tablet 1  . amiodarone (PACERONE) 200 MG tablet  Take 1 tablet (200 mg total) by mouth daily. 90 tablet 3  . apixaban (ELIQUIS) 5 MG TABS tablet Take 1 tablet (5 mg total) by mouth 2 (two) times daily. 60 tablet 5  . aspirin EC 81 MG tablet Take 81 mg by mouth daily.    [name]. atorvastatin (LIPITOR) 40 MG tablet Take 1 tablet (40 mg total) by mouth daily at 6 PM. 30 tablet 1  . carvedilol (COREG) 12.5 MG tablet Take 1 tablet (12.5 mg total) by mouth 2 (two) times daily. 180 tablet 3  . chlorthalidone (HYGROTON) 25 MG tablet Take 1 tablet (25 mg total) by mouth daily. 90 tablet 3  . COLCRYS 0.6 MG tablet Take 1 tablet by mouth 2 (two) times daily as needed (gout).     [name]. diltiazem (CARDIZEM) 30 MG tablet Take 30 mg by mouth daily as needed.    . fluticasone (FLONASE) 50 MCG/ACT nasal spray Place 1 spray into both nostrils daily as needed for allergies or rhinitis.    [name]. loratadine (CLARITIN) 10 MG tablet Take 10 mg by mouth daily.    . multivitamin (THERAGRAN) per tablet Take 1 tablet by mouth daily.      . nitroGLYCERIN (NITROSTAT) 0.4 MG SL tablet Place 1 tablet (0.4 mg total) under the tongue every 5 (five) minutes x 3 doses as needed for chest pain. 25 tablet 2   No current facility-administered medications for this visit.     Allergies:   Azithromycin and Oxycodone   Social History:  The patient  reports that he quit smoking about 52 years ago. His smoking use included cigarettes. He quit smokeless tobacco use about 11 years ago.  His smokeless tobacco use included chew. He reports current alcohol use. He reports that he does not use drugs.   Family History:  The patient's  family history includes Cancer in his mother; Coronary artery disease in his brother; Heart attack in his mother; Prostate cancer in his father.   ROS:  Please see the history of present illness.   All other systems are personally reviewed and negative.    Exam:    Vital Signs:  BP (!) 160/97   Pulse 67   Ht 5\' 11"  (1.803 m)   BMI 31.52 kg/m  no acute distress, no  shortness of breath.  Labs/Other Tests and Data Reviewed:    Recent Labs: 05/17/2018: B Natriuretic Peptide 86.0 09/07/2018: BUN 36; Creat 2.02; Hemoglobin 15.2; Platelets 221; Potassium 3.9; Sodium 138   Wt Readings from Last 3 Encounters:  07/21/18 226 lb (102.5 kg)  06/21/18 227 lb 3.2 oz (103.1 kg)  05/17/18 220 lb (99.8 kg)     Other studies personally reviewed: Additional studies/ records that were reviewed today include: ECG 07/21/2018 personally reviewed Review of the above records today demonstrates: Sinus rhythm, anterolateral infarct  ASSESSMENT & PLAN:    1.  Persistent atrial fibrillation: Currently on Coreg and amiodarone.  We Momin Misko plan for ablation as he continues to go in and out of atrial fibrillation.  Risks and benefits were discussed which include bleeding, tamponade, heart block, stroke, damage surrounding organs.  The patient understands these risks and  is agreed to the procedure.  Patient is currently on Eliquis.  [facility] vascular 4.  2.  Coronary artery disease: Status post CABG.  No current chest pain.  3.  Hypertension: Elevated today but he has not yet taken his blood pressure medications.  Has been more normal in the past.  No changes.   COVID 19 screen The patient denies symptoms of COVID 19 at this time.  The importance of social distancing was discussed today.  Follow-up:  3 months  Current medicines are reviewed at length with the patient today.   The patient does not have concerns regarding his medicines.  The following changes were made today:  none  Labs/ tests ordered today include:  No orders of the defined types were placed in this encounter.    Patient Risk:  after full review of this patients clinical status, I feel that they are at moderate risk at this time.  Today, I have spent 8 minutes with the patient with telehealth technology discussing AF .    Signed, Malisa Ruggiero Meredith Leeds, MD  09/12/2018 8:14 AM     [facility] [address] [place] [address] [phone] (office) [phone] (fax)

## 2018-09-13 ENCOUNTER — Telehealth (HOSPITAL_COMMUNITY): Payer: Self-pay | Admitting: Emergency Medicine

## 2018-09-13 ENCOUNTER — Other Ambulatory Visit (HOSPITAL_COMMUNITY): Payer: Self-pay | Admitting: Emergency Medicine

## 2018-09-13 DIAGNOSIS — N289 Disorder of kidney and ureter, unspecified: Secondary | ICD-10-CM

## 2018-09-13 NOTE — Telephone Encounter (Signed)
Reaching out to patient to offer assistance regarding upcoming cardiac imaging study; pt verbalizes understanding of appt date/time, parking situation and where to check in, pre-test NPO status and medications ordered, and verified current allergies; name and call back number provided for further questions should they arise Marchia Bond RN Navigator Cardiac Imaging [facility] [phone] office [phone] cell  Pt denies covid symptoms, denies having ED medications in 5 days,

## 2018-09-14 ENCOUNTER — Ambulatory Visit (HOSPITAL_COMMUNITY): Admission: RE | Admit: 2018-09-14 | Payer: Medicare Other | Source: Ambulatory Visit

## 2018-09-14 ENCOUNTER — Other Ambulatory Visit: Payer: Self-pay

## 2018-09-14 ENCOUNTER — Ambulatory Visit (HOSPITAL_COMMUNITY)
Admission: RE | Admit: 2018-09-14 | Discharge: 2018-09-14 | Disposition: A | Discharge status: Home / Self Care | Payer: Medicare Other | Source: Ambulatory Visit | Attending: Cardiology | Admitting: Cardiology

## 2018-09-14 DIAGNOSIS — I4819 Other persistent atrial fibrillation: Secondary | ICD-10-CM

## 2018-09-14 DIAGNOSIS — N289 Disorder of kidney and ureter, unspecified: Secondary | ICD-10-CM

## 2018-09-14 LAB — BASIC METABOLIC PANEL
Anion gap: 10 (ref 5–15)
BUN: 28 mg/dL — ABNORMAL HIGH (ref 8–23)
CO2: 26 mmol/L (ref 22–32)
Calcium: 10.1 mg/dL (ref 8.9–10.3)
Chloride: 103 mmol/L (ref 98–111)
Creatinine, Ser: 2.07 mg/dL — ABNORMAL HIGH (ref 0.61–1.24)
GFR calc Af Amer: 35 mL/min — ABNORMAL LOW (ref >60)
GFR calc non Af Amer: 30 mL/min — ABNORMAL LOW (ref >60)
Glucose, Bld: 113 mg/dL — ABNORMAL HIGH (ref 70–99)
Potassium: 4.3 mmol/L (ref 3.5–5.1)
Sodium: 139 mmol/L (ref 135–145)

## 2018-09-14 MED ORDER — SODIUM BICARBONATE BOLUS VIA INFUSION
INTRAVENOUS | Status: AC
  Administered 2018-09-14: 09:00:00 75 meq via INTRAVENOUS
  Filled 2018-09-14: qty 1

## 2018-09-14 MED ORDER — IOHEXOL 350 MG/ML SOLN
100.0000 mL | Freq: Once | INTRAVENOUS | Status: AC | PRN
  Administered 2018-09-14: 100 mL via INTRAVENOUS

## 2018-09-14 MED ORDER — SODIUM BICARBONATE 8.4 % IV SOLN
INTRAVENOUS | Status: DC
  Filled 2018-09-14: qty 500

## 2018-09-16 ENCOUNTER — Other Ambulatory Visit (HOSPITAL_COMMUNITY)
Admission: RE | Admit: 2018-09-16 | Discharge: 2018-09-16 | Disposition: A | Discharge status: Home / Self Care | Payer: Medicare Other | Source: Ambulatory Visit | Attending: Cardiology | Admitting: Cardiology

## 2018-09-16 DIAGNOSIS — Z20828 Contact with and (suspected) exposure to other viral communicable diseases: Secondary | ICD-10-CM | POA: Insufficient documentation

## 2018-09-16 DIAGNOSIS — Z01812 Encounter for preprocedural laboratory examination: Secondary | ICD-10-CM | POA: Diagnosis present

## 2018-09-17 LAB — NOVEL CORONAVIRUS, NAA (HOSP ORDER, SEND-OUT TO REF LAB; TAT 18-24 HRS): SARS-CoV-2, NAA: NOT DETECTED

## 2018-09-19 NOTE — Anesthesia Preprocedure Evaluation (Addendum)
Anesthesia Evaluation  Patient identified by MRN, date of birth, ID band Patient awake    Reviewed: Allergy & Precautions, NPO status , Patient's Chart, lab work & pertinent test results  History of Anesthesia Complications (+) PONV  Airway Mallampati: II  TM Distance: >3 FB Neck ROM: Full    Dental  (+) Poor Dentition, Dental Advisory Given   Pulmonary former smoker,    breath sounds clear to auscultation       Cardiovascular hypertension, + angina + CAD, + Past MI and +CHF   Rhythm:Irregular Rate:Normal     Neuro/Psych    GI/Hepatic GERD  ,  Endo/Other  negative endocrine ROS  Renal/GU Renal disease     Musculoskeletal   Abdominal   Peds  Hematology   Anesthesia Other Findings   Reproductive/Obstetrics                           Anesthesia Physical Anesthesia Plan  ASA: III  Anesthesia Plan: General   Post-op Pain Management:    Induction: Intravenous  PONV Risk Score and Plan: Ondansetron, Dexamethasone and Midazolam  Airway Management Planned: Oral ETT  Additional Equipment:   Intra-op Plan:   Post-operative Plan: Possible Post-op intubation/ventilation  Informed Consent: I have reviewed the patients History and Physical, chart, labs and discussed the procedure including the risks, benefits and alternatives for the proposed anesthesia with the patient or authorized representative who has indicated his/her understanding and acceptance.     Dental advisory given  Plan Discussed with: CRNA and Anesthesiologist  Anesthesia Plan Comments:         Anesthesia Quick Evaluation

## 2018-09-20 ENCOUNTER — Ambulatory Visit (HOSPITAL_COMMUNITY): Payer: Medicare Other | Admitting: Anesthesiology

## 2018-09-20 ENCOUNTER — Other Ambulatory Visit: Payer: Self-pay

## 2018-09-20 ENCOUNTER — Encounter (HOSPITAL_COMMUNITY)
Admission: RE | Disposition: A | Discharge status: Home / Self Care | Payer: Medicare Other | Source: Home / Self Care | Attending: Cardiology

## 2018-09-20 ENCOUNTER — Ambulatory Visit (HOSPITAL_COMMUNITY)
Admission: RE | Admit: 2018-09-20 | Discharge: 2018-09-20 | Disposition: A | Discharge status: Home / Self Care | Payer: Medicare Other | Source: Home / Self Care | Attending: Cardiology | Admitting: Cardiology

## 2018-09-20 ENCOUNTER — Encounter (HOSPITAL_COMMUNITY): Payer: Self-pay | Admitting: Anesthesiology

## 2018-09-20 DIAGNOSIS — Z79899 Other long term (current) drug therapy: Secondary | ICD-10-CM | POA: Insufficient documentation

## 2018-09-20 DIAGNOSIS — N189 Chronic kidney disease, unspecified: Secondary | ICD-10-CM | POA: Insufficient documentation

## 2018-09-20 DIAGNOSIS — I4892 Unspecified atrial flutter: Secondary | ICD-10-CM

## 2018-09-20 DIAGNOSIS — I5022 Chronic systolic (congestive) heart failure: Secondary | ICD-10-CM | POA: Insufficient documentation

## 2018-09-20 DIAGNOSIS — I25119 Atherosclerotic heart disease of native coronary artery with unspecified angina pectoris: Secondary | ICD-10-CM | POA: Insufficient documentation

## 2018-09-20 DIAGNOSIS — I130 Hypertensive heart and chronic kidney disease with heart failure and stage 1 through stage 4 chronic kidney disease, or unspecified chronic kidney disease: Secondary | ICD-10-CM | POA: Insufficient documentation

## 2018-09-20 DIAGNOSIS — Z7901 Long term (current) use of anticoagulants: Secondary | ICD-10-CM | POA: Insufficient documentation

## 2018-09-20 DIAGNOSIS — I4819 Other persistent atrial fibrillation: Secondary | ICD-10-CM | POA: Insufficient documentation

## 2018-09-20 DIAGNOSIS — M109 Gout, unspecified: Secondary | ICD-10-CM | POA: Insufficient documentation

## 2018-09-20 DIAGNOSIS — I252 Old myocardial infarction: Secondary | ICD-10-CM | POA: Insufficient documentation

## 2018-09-20 DIAGNOSIS — Z87891 Personal history of nicotine dependence: Secondary | ICD-10-CM | POA: Insufficient documentation

## 2018-09-20 HISTORY — PX: ATRIAL FIBRILLATION ABLATION: EP1191

## 2018-09-20 LAB — POCT ACTIVATED CLOTTING TIME
Activated Clotting Time: 307 seconds
Activated Clotting Time: 323 seconds
Activated Clotting Time: 323 seconds
Activated Clotting Time: 323 seconds
Activated Clotting Time: 147 seconds

## 2018-09-20 SURGERY — ATRIAL FIBRILLATION ABLATION
Anesthesia: General

## 2018-09-20 MED ORDER — PROTAMINE SULFATE 10 MG/ML IV SOLN
INTRAVENOUS | Status: DC | PRN
  Administered 2018-09-20: 20 mg via INTRAVENOUS
  Administered 2018-09-20 (×3): 10 mg via INTRAVENOUS

## 2018-09-20 MED ORDER — SODIUM CHLORIDE 0.9 % IV SOLN: 250.0000 mL | INTRAVENOUS | Status: DC | PRN

## 2018-09-20 MED ORDER — BUPIVACAINE HCL (PF) 0.25 % IJ SOLN
INTRAMUSCULAR | Status: AC
  Filled 2018-09-20: qty 30

## 2018-09-20 MED ORDER — BUPIVACAINE HCL (PF) 0.25 % IJ SOLN
INTRAMUSCULAR | Status: DC | PRN
  Administered 2018-09-20: 20 mL

## 2018-09-20 MED ORDER — DOBUTAMINE IN D5W 4-5 MG/ML-% IV SOLN
INTRAVENOUS | Status: AC
  Filled 2018-09-20: qty 250

## 2018-09-20 MED ORDER — LIDOCAINE 2% (20 MG/ML) 5 ML SYRINGE
INTRAMUSCULAR | Status: DC | PRN
  Administered 2018-09-20: 80 mg via INTRAVENOUS

## 2018-09-20 MED ORDER — DEXAMETHASONE SODIUM PHOSPHATE 4 MG/ML IJ SOLN
INTRAMUSCULAR | Status: DC | PRN
  Administered 2018-09-20: 4 mg via INTRAVENOUS

## 2018-09-20 MED ORDER — EPHEDRINE SULFATE-NACL 50-0.9 MG/10ML-% IV SOSY
PREFILLED_SYRINGE | INTRAVENOUS | Status: DC | PRN
  Administered 2018-09-20 (×4): 10 mg via INTRAVENOUS

## 2018-09-20 MED ORDER — SODIUM CHLORIDE 0.9% FLUSH: 3.0000 mL | INTRAVENOUS | Status: DC | PRN

## 2018-09-20 MED ORDER — SODIUM CHLORIDE 0.9 % IV SOLN
INTRAVENOUS | Status: DC | PRN
  Administered 2018-09-20: 25 ug/min via INTRAVENOUS

## 2018-09-20 MED ORDER — HEPARIN SODIUM (PORCINE) 1000 UNIT/ML IJ SOLN
INTRAMUSCULAR | Status: AC
  Filled 2018-09-20: qty 1

## 2018-09-20 MED ORDER — ONDANSETRON HCL 4 MG/2ML IJ SOLN: 4.0000 mg | Freq: Four times a day (QID) | INTRAMUSCULAR | Status: DC | PRN

## 2018-09-20 MED ORDER — HEPARIN (PORCINE) IN NACL 1000-0.9 UT/500ML-% IV SOLN
INTRAVENOUS | Status: AC
  Filled 2018-09-20: qty 500

## 2018-09-20 MED ORDER — PROPOFOL 10 MG/ML IV BOLUS
INTRAVENOUS | Status: DC | PRN
  Administered 2018-09-20: 150 mg via INTRAVENOUS
  Administered 2018-09-20: 50 mg via INTRAVENOUS
  Administered 2018-09-20: 20 mg via INTRAVENOUS

## 2018-09-20 MED ORDER — HEPARIN SODIUM (PORCINE) 1000 UNIT/ML IJ SOLN
INTRAMUSCULAR | Status: DC | PRN
  Administered 2018-09-20: 1000 [IU] via INTRAVENOUS

## 2018-09-20 MED ORDER — FENTANYL CITRATE (PF) 100 MCG/2ML IJ SOLN
INTRAMUSCULAR | Status: DC | PRN
  Administered 2018-09-20: 100 ug via INTRAVENOUS

## 2018-09-20 MED ORDER — ACETAMINOPHEN 325 MG PO TABS: 650.0000 mg | ORAL_TABLET | ORAL | Status: DC | PRN

## 2018-09-20 MED ORDER — ROCURONIUM BROMIDE 10 MG/ML (PF) SYRINGE
PREFILLED_SYRINGE | INTRAVENOUS | Status: DC | PRN
  Administered 2018-09-20: 40 mg via INTRAVENOUS

## 2018-09-20 MED ORDER — SODIUM CHLORIDE 0.9 % IV SOLN
INTRAVENOUS | Status: DC
  Administered 2018-09-20 (×2): via INTRAVENOUS

## 2018-09-20 MED ORDER — DOBUTAMINE IN D5W 4-5 MG/ML-% IV SOLN
INTRAVENOUS | Status: DC | PRN
  Administered 2018-09-20: 20 ug/kg/min via INTRAVENOUS

## 2018-09-20 MED ORDER — HEPARIN (PORCINE) IN NACL 1000-0.9 UT/500ML-% IV SOLN
INTRAVENOUS | Status: DC | PRN
  Administered 2018-09-20 (×5): 500 mL

## 2018-09-20 MED ORDER — ALBUMIN HUMAN 5 % IV SOLN
INTRAVENOUS | Status: DC | PRN
  Administered 2018-09-20: 09:00:00 via INTRAVENOUS

## 2018-09-20 MED ORDER — PHENYLEPHRINE 40 MCG/ML (10ML) SYRINGE FOR IV PUSH (FOR BLOOD PRESSURE SUPPORT)
PREFILLED_SYRINGE | INTRAVENOUS | Status: DC | PRN
  Administered 2018-09-20: 80 ug via INTRAVENOUS
  Administered 2018-09-20: 120 ug via INTRAVENOUS
  Administered 2018-09-20: 80 ug via INTRAVENOUS
  Administered 2018-09-20: 120 ug via INTRAVENOUS

## 2018-09-20 MED ORDER — SODIUM CHLORIDE 0.9% FLUSH: 3.0000 mL | Freq: Two times a day (BID) | INTRAVENOUS | Status: DC

## 2018-09-20 MED ORDER — SUCCINYLCHOLINE CHLORIDE 200 MG/10ML IV SOSY
PREFILLED_SYRINGE | INTRAVENOUS | Status: DC | PRN
  Administered 2018-09-20: 140 mg via INTRAVENOUS

## 2018-09-20 MED ORDER — HEPARIN SODIUM (PORCINE) 1000 UNIT/ML IJ SOLN
INTRAMUSCULAR | Status: DC | PRN
  Administered 2018-09-20: 2000 [IU] via INTRAVENOUS
  Administered 2018-09-20: 15000 [IU] via INTRAVENOUS
  Administered 2018-09-20 (×2): 2000 [IU] via INTRAVENOUS

## 2018-09-20 MED ORDER — SUGAMMADEX SODIUM 200 MG/2ML IV SOLN
INTRAVENOUS | Status: DC | PRN
  Administered 2018-09-20: 200 mg via INTRAVENOUS

## 2018-09-20 MED ORDER — ONDANSETRON HCL 4 MG/2ML IJ SOLN
INTRAMUSCULAR | Status: DC | PRN
  Administered 2018-09-20: 4 mg via INTRAVENOUS

## 2018-09-20 MED FILL — Dextrose Inj 5%: INTRAVENOUS | Qty: 500 | Status: AC

## 2018-09-20 MED FILL — Sodium Bicarbonate IV Soln 8.4%: INTRAVENOUS | Qty: 50 | Status: AC

## 2018-09-20 SURGICAL SUPPLY — 20 items
BLANKET WARM UNDERBOD FULL ACC (MISCELLANEOUS) ×2 IMPLANT
CATH MAPPNG PENTARAY F 2-6-2MM (CATHETERS) ×1 IMPLANT
CATH SMTCH THERMOCOOL SF DF (CATHETERS) ×2 IMPLANT
CATH SOUNDSTAR ECO REPROCESSED (CATHETERS) ×2 IMPLANT
CATH WEBSTER BI DIR CS D-F CRV (CATHETERS) ×2 IMPLANT
COVER SWIFTLINK CONNECTOR (BAG) ×2 IMPLANT
PACK EP LATEX FREE (CUSTOM PROCEDURE TRAY) ×1
PACK EP LF (CUSTOM PROCEDURE TRAY) ×1 IMPLANT
PAD PRO RADIOLUCENT 2001M-C (PAD) ×2 IMPLANT
PATCH CARTO3 (PAD) ×2 IMPLANT
PENTARAY F 2-6-2MM (CATHETERS) ×2
SHEATH AVANTI 11F 11CM (SHEATH) ×2 IMPLANT
SHEATH BAYLIS SUREFLEX  M 8.5 (SHEATH) ×1
SHEATH BAYLIS SUREFLEX M 8.5 (SHEATH) ×1 IMPLANT
SHEATH BAYLIS TRANSSEPTAL 98CM (NEEDLE) ×2 IMPLANT
SHEATH CARTO VIZIGO SM CVD (SHEATH) ×2 IMPLANT
SHEATH PINNACLE 7F 10CM (SHEATH) ×2 IMPLANT
SHEATH PINNACLE 8F 10CM (SHEATH) ×4 IMPLANT
SHEATH PINNACLE 9F 10CM (SHEATH) ×4 IMPLANT
TUBING SMART ABLATE COOLFLOW (TUBING) ×2 IMPLANT

## 2018-09-20 NOTE — Progress Notes (Addendum)
On arrival from cath lab right groin firmness noted  About 5x5cm and pressure held by Dalbert Batman, RN x 15 min and right groin soft

## 2018-09-20 NOTE — Progress Notes (Signed)
Dr Curt Bears notified of firmness right groin, pressure held and groin now soft; no new orders; client advised to hold either groin if laughs, coughs , or sneezes and hold either groin and call nurse if feels anything  warm,wet, painful, or swollen right groin and client voiced understanding

## 2018-09-20 NOTE — H&P (Signed)
Gregory Davila has presented today for surgery, with the diagnosis of atrial fibrillation.  The various methods of treatment have been discussed with the patient and family. After consideration of risks, benefits and other options for treatment, the patient has consented to  Procedure(s): Catheter ablation as a surgical intervention .  Risks include but not limited to bleeding, tamponade, heart block, stroke, damage to surrounding organs, among others. The patient's history has been reviewed, patient examined, no change in status, stable for surgery.  I have reviewed the patient's chart and labs.  Questions were answered to the patient's satisfaction.    Eliya Bubar Curt Bears, MD 09/20/2018 8:40 AM

## 2018-09-20 NOTE — Anesthesia Procedure Notes (Signed)
Procedure Name: Intubation Date/Time: 09/20/2018 9:32 AM Performed by: Jenne Campus, CRNA Pre-anesthesia Checklist: Patient identified, Emergency Drugs available, Suction available and Patient being monitored Patient Re-evaluated:Patient Re-evaluated prior to induction Oxygen Delivery Method: Circle System Utilized Preoxygenation: Pre-oxygenation with 100% oxygen Induction Type: IV induction Laryngoscope Size: Glidescope and 4 Grade View: Grade I Tube type: Oral Tube size: 7.5 mm Number of attempts: 1 Airway Equipment and Method: Stylet,  Oral airway and Video-laryngoscopy Placement Confirmation: ETT inserted through vocal cords under direct vision,  positive ETCO2 and breath sounds checked- equal and bilateral Secured at: 21 cm Tube secured with: Tape Dental Injury: Teeth and Oropharynx as per pre-operative assessment

## 2018-09-20 NOTE — Progress Notes (Signed)
Up and walked and tolerated well; bilat groins stable, no bleeding or hematoma 

## 2018-09-20 NOTE — Transfer of Care (Signed)
Immediate Anesthesia Transfer of Care Note  Patient: Gregory Davila  Procedure(s) Performed: ATRIAL FIBRILLATION ABLATION (N/A )  Patient Location: PACU  Anesthesia Type:General  Level of Consciousness: oriented, drowsy and patient cooperative  Airway & Oxygen Therapy: Patient Spontanous Breathing and Patient connected to nasal cannula oxygen  Post-op Assessment: Report given to RN and Post -op Vital signs reviewed and stable  Post vital signs: Reviewed  Last Vitals:  Vitals Value Taken Time  BP 113/57 09/20/18 1213  Temp    Pulse 65 09/20/18 1216  Resp 8 09/20/18 1216  SpO2 94 % 09/20/18 1216  Vitals shown include unvalidated device data.  Last Pain:  Vitals:   09/20/18 0741  TempSrc:   PainSc: 0-No pain         Complications: No apparent anesthesia complications

## 2018-09-20 NOTE — Progress Notes (Signed)
2 X 9 Fr R F/V and 7 and 11 Fr L F/V sheath were pulled, and manual pressure was held for 30 min. Sterile gauze was applied at both sites. Both groins are soft and non tender.   R and DP was palpable before and after the sheaht pull.    Bed rest started at 1400 X 6 hr. Instructions were given to patient about this time.    BP 112/68 HR 78 SR sPO2 93 % on R/A

## 2018-09-20 NOTE — Discharge Instructions (Signed)
Cardiac Ablation, Care After °This sheet gives you information about how to care for yourself after your procedure. Your health care provider may also give you more specific instructions. If you have problems or questions, contact your health care provider. °What can I expect after the procedure? °After the procedure, it is common to have: °· Bruising around your puncture site. °· Tenderness around your puncture site. °· Skipped heartbeats. °· Tiredness (fatigue). °Follow these instructions at home: °Puncture site care ° °· Follow instructions from your health care provider about how to take care of your puncture site. Make sure you: °? Wash your hands with soap and water before you change your bandage (dressing). If soap and water are not available, use hand sanitizer. °? Change your dressing as told by your health care provider. °? Leave stitches (sutures), skin glue, or adhesive strips in place. These skin closures may need to stay in place for up to 2 weeks. If adhesive strip edges start to loosen and curl up, you may trim the loose edges. Do not remove adhesive strips completely unless your health care provider tells you to do that. °· Check your puncture site every day for signs of infection. Check for: °? Redness, swelling, or pain. °? Fluid or blood. If your puncture site starts to bleed, lie down on your back, apply firm pressure to the area, and contact your health care provider. °? Warmth. °? Pus or a bad smell. °Driving °· Ask your health care provider when it is safe for you to drive again after the procedure. °· Do not drive or use heavy machinery while taking prescription pain medicine. °· Do not drive for 24 hours if you were given a medicine to help you relax (sedative) during your procedure. °Activity °· Avoid activities that take a lot of effort for at least 3 days after your procedure. °· Do not lift anything that is heavier than 10 lb (4.5 kg), or the limit that you are told, until your health  care provider says that it is safe. °· Return to your normal activities as told by your health care provider. Ask your health care provider what activities are safe for you. °General instructions °· Take over-the-counter and prescription medicines only as told by your health care provider. °· Do not use any products that contain nicotine or tobacco, such as cigarettes and e-cigarettes. If you need help quitting, ask your health care provider. °· Do not take baths, swim, or use a hot tub until your health care provider approves. °· Do not drink alcohol for 24 hours after your procedure. °· Keep all follow-up visits as told by your health care provider. This is important. °Contact a health care provider if: °· You have redness, mild swelling, or pain around your puncture site. °· You have fluid or blood coming from your puncture site that stops after applying firm pressure to the area. °· Your puncture site feels warm to the touch. °· You have pus or a bad smell coming from your puncture site. °· You have a fever. °· You have chest pain or discomfort that spreads to your neck, jaw, or arm. °· You are sweating a lot. °· You feel nauseous. °· You have a fast or irregular heartbeat. °· You have shortness of breath. °· You are dizzy or light-headed and feel the need to lie down. °· You have pain or numbness in the arm or leg closest to your puncture site. °Get help right away if: °· Your puncture   site suddenly swells. °· Your puncture site is bleeding and the bleeding does not stop after applying firm pressure to the area. °These symptoms may represent a serious problem that is an emergency. Do not wait to see if the symptoms will go away. Get medical help right away. Call your local emergency services (911 in the [place] Do not drive yourself to the hospital. °Summary °· After the procedure, it is normal to have bruising and tenderness at the puncture site in your groin, neck, or forearm. °· Check your puncture site every  day for signs of infection. °· Get help right away if your puncture site is bleeding and the bleeding does not stop after applying firm pressure to the area. This is a medical emergency. °This information is not intended to replace advice given to you by your health care provider. Make sure you discuss any questions you have with your health care provider. °Document Released: 04/01/2016 Document Revised: 12/03/2016 Document Reviewed: 04/01/2016 °Elsevier Patient Education © 2020 [employer] ° °

## 2018-09-21 ENCOUNTER — Emergency Department (HOSPITAL_COMMUNITY): Payer: Medicare Other

## 2018-09-21 ENCOUNTER — Other Ambulatory Visit: Payer: Self-pay

## 2018-09-21 ENCOUNTER — Telehealth: Payer: Self-pay | Admitting: Cardiology

## 2018-09-21 ENCOUNTER — Inpatient Hospital Stay (HOSPITAL_COMMUNITY)
Admission: EM | Admit: 2018-09-21 | Discharge: 2018-09-26 | DRG: 274 | Disposition: A | Discharge status: Home / Self Care | Payer: Medicare Other | Attending: Cardiology | Admitting: Cardiology

## 2018-09-21 ENCOUNTER — Encounter (HOSPITAL_COMMUNITY): Payer: Self-pay | Admitting: Cardiology

## 2018-09-21 DIAGNOSIS — Z7982 Long term (current) use of aspirin: Secondary | ICD-10-CM | POA: Diagnosis not present

## 2018-09-21 DIAGNOSIS — Z79890 Hormone replacement therapy (postmenopausal): Secondary | ICD-10-CM | POA: Diagnosis not present

## 2018-09-21 DIAGNOSIS — Z881 Allergy status to other antibiotic agents status: Secondary | ICD-10-CM | POA: Diagnosis not present

## 2018-09-21 DIAGNOSIS — I770 Arteriovenous fistula, acquired: Secondary | ICD-10-CM | POA: Diagnosis present

## 2018-09-21 DIAGNOSIS — Z20828 Contact with and (suspected) exposure to other viral communicable diseases: Secondary | ICD-10-CM | POA: Diagnosis not present

## 2018-09-21 DIAGNOSIS — I129 Hypertensive chronic kidney disease with stage 1 through stage 4 chronic kidney disease, or unspecified chronic kidney disease: Secondary | ICD-10-CM | POA: Diagnosis present

## 2018-09-21 DIAGNOSIS — Z885 Allergy status to narcotic agent status: Secondary | ICD-10-CM

## 2018-09-21 DIAGNOSIS — T81718A Complication of other artery following a procedure, not elsewhere classified, initial encounter: Secondary | ICD-10-CM | POA: Diagnosis present

## 2018-09-21 DIAGNOSIS — I4891 Unspecified atrial fibrillation: Secondary | ICD-10-CM | POA: Diagnosis not present

## 2018-09-21 DIAGNOSIS — T148XXA Other injury of unspecified body region, initial encounter: Secondary | ICD-10-CM

## 2018-09-21 DIAGNOSIS — N183 Chronic kidney disease, stage 3 (moderate): Secondary | ICD-10-CM | POA: Diagnosis not present

## 2018-09-21 DIAGNOSIS — I252 Old myocardial infarction: Secondary | ICD-10-CM | POA: Diagnosis not present

## 2018-09-21 DIAGNOSIS — Z96653 Presence of artificial knee joint, bilateral: Secondary | ICD-10-CM | POA: Diagnosis present

## 2018-09-21 DIAGNOSIS — D62 Acute posthemorrhagic anemia: Secondary | ICD-10-CM | POA: Diagnosis not present

## 2018-09-21 DIAGNOSIS — M109 Gout, unspecified: Secondary | ICD-10-CM | POA: Diagnosis not present

## 2018-09-21 DIAGNOSIS — Z8249 Family history of ischemic heart disease and other diseases of the circulatory system: Secondary | ICD-10-CM | POA: Diagnosis not present

## 2018-09-21 DIAGNOSIS — Z87891 Personal history of nicotine dependence: Secondary | ICD-10-CM | POA: Diagnosis not present

## 2018-09-21 DIAGNOSIS — I724 Aneurysm of artery of lower extremity: Secondary | ICD-10-CM | POA: Diagnosis present

## 2018-09-21 DIAGNOSIS — Z86711 Personal history of pulmonary embolism: Secondary | ICD-10-CM | POA: Diagnosis not present

## 2018-09-21 DIAGNOSIS — I2510 Atherosclerotic heart disease of native coronary artery without angina pectoris: Secondary | ICD-10-CM | POA: Diagnosis present

## 2018-09-21 DIAGNOSIS — Z79899 Other long term (current) drug therapy: Secondary | ICD-10-CM

## 2018-09-21 DIAGNOSIS — I4819 Other persistent atrial fibrillation: Principal | ICD-10-CM | POA: Diagnosis present

## 2018-09-21 DIAGNOSIS — I729 Aneurysm of unspecified site: Secondary | ICD-10-CM

## 2018-09-21 DIAGNOSIS — Z7901 Long term (current) use of anticoagulants: Secondary | ICD-10-CM | POA: Diagnosis not present

## 2018-09-21 LAB — BASIC METABOLIC PANEL
Anion gap: 9 (ref 5–15)
BUN: 36 mg/dL — ABNORMAL HIGH (ref 8–23)
CO2: 21 mmol/L — ABNORMAL LOW (ref 22–32)
Calcium: 8.4 mg/dL — ABNORMAL LOW (ref 8.9–10.3)
Chloride: 106 mmol/L (ref 98–111)
Creatinine, Ser: 1.94 mg/dL — ABNORMAL HIGH (ref 0.61–1.24)
GFR calc Af Amer: 38 mL/min — ABNORMAL LOW (ref >60)
GFR calc non Af Amer: 32 mL/min — ABNORMAL LOW (ref >60)
Glucose, Bld: 141 mg/dL — ABNORMAL HIGH (ref 70–99)
Potassium: 4 mmol/L (ref 3.5–5.1)
Sodium: 136 mmol/L (ref 135–145)

## 2018-09-21 LAB — CBC
HCT: 38.5 % — ABNORMAL LOW (ref 39.0–52.0)
Hemoglobin: 12.3 g/dL — ABNORMAL LOW (ref 13.0–17.0)
MCH: 31.1 pg (ref 26.0–34.0)
MCHC: 31.9 g/dL (ref 30.0–36.0)
MCV: 97.2 fL (ref 80.0–100.0)
Platelets: 197 10*3/uL (ref 150–400)
RBC: 3.96 MIL/uL — ABNORMAL LOW (ref 4.22–5.81)
RDW: 13.8 % (ref 11.5–15.5)
WBC: 15.3 10*3/uL — ABNORMAL HIGH (ref 4.0–10.5)
nRBC: 0 % (ref 0.0–0.2)

## 2018-09-21 MED ORDER — ASPIRIN EC 81 MG PO TBEC: 81.0000 mg | DELAYED_RELEASE_TABLET | Freq: Every day | ORAL | Status: DC

## 2018-09-21 MED ORDER — LEVOTHYROXINE SODIUM 50 MCG PO TABS
50.0000 ug | ORAL_TABLET | Freq: Every day | ORAL | Status: DC
  Administered 2018-09-22 – 2018-09-26 (×5): 50 ug via ORAL
  Filled 2018-09-21 (×5): qty 1

## 2018-09-21 MED ORDER — CARVEDILOL 12.5 MG PO TABS
12.5000 mg | ORAL_TABLET | Freq: Two times a day (BID) | ORAL | Status: DC
  Administered 2018-09-21 – 2018-09-26 (×9): 12.5 mg via ORAL
  Filled 2018-09-21 (×10): qty 1

## 2018-09-21 MED ORDER — ACETAMINOPHEN 325 MG PO TABS
650.0000 mg | ORAL_TABLET | Freq: Four times a day (QID) | ORAL | Status: DC | PRN
  Administered 2018-09-21 – 2018-09-25 (×8): 650 mg via ORAL
  Filled 2018-09-21 (×10): qty 2

## 2018-09-21 MED ORDER — AMIODARONE HCL 200 MG PO TABS
200.0000 mg | ORAL_TABLET | Freq: Every day | ORAL | Status: DC
  Administered 2018-09-22 – 2018-09-26 (×5): 200 mg via ORAL
  Filled 2018-09-21 (×6): qty 1

## 2018-09-21 MED ORDER — ALLOPURINOL 300 MG PO TABS
150.0000 mg | ORAL_TABLET | Freq: Every day | ORAL | Status: DC
  Administered 2018-09-22 – 2018-09-26 (×5): 150 mg via ORAL
  Filled 2018-09-21 (×5): qty 1

## 2018-09-21 MED ORDER — CHLORTHALIDONE 25 MG PO TABS: 25.0000 mg | ORAL_TABLET | Freq: Every day | ORAL | Status: DC

## 2018-09-21 MED ORDER — ATORVASTATIN CALCIUM 40 MG PO TABS
40.0000 mg | ORAL_TABLET | Freq: Every day | ORAL | Status: DC
  Administered 2018-09-22 – 2018-09-25 (×4): 40 mg via ORAL
  Filled 2018-09-21 (×4): qty 1

## 2018-09-21 NOTE — ED Provider Notes (Signed)
  Physical Exam  BP 116/76   Pulse 75   Temp 98.2 F (36.8 C) (Oral)   Resp 18   Ht 5\' 11"  (1.803 m)   Wt 99 kg   SpO2 100%   BMI 30.44 kg/m   Physical Exam  ED Course/Procedures     Procedures  MDM  Patient's ultrasound shows pseudoaneurysm and fistula.  Hemoglobin is gone from 15 down to 12.  Discussed with Dr. Bronson Ing, who discussed with Dr. Curt Bears.  I also discussed with Dr. Donzetta Matters from vascular surgery.  Will transfer down to [facility] for possible procedure tomorrow.       Davonna Belling, MD 09/21/18 1751

## 2018-09-21 NOTE — Anesthesia Postprocedure Evaluation (Signed)
Anesthesia Post Note  Patient: Gregory Davila  Procedure(s) Performed: ATRIAL FIBRILLATION ABLATION (N/A )     Patient location during evaluation: PACU Anesthesia Type: General Level of consciousness: awake and alert Pain management: pain level controlled Vital Signs Assessment: post-procedure vital signs reviewed and stable Respiratory status: spontaneous breathing, nonlabored ventilation, respiratory function stable and patient connected to nasal cannula oxygen Cardiovascular status: blood pressure returned to baseline and stable Postop Assessment: no apparent nausea or vomiting Anesthetic complications: no    Last Vitals:  Vitals:   09/20/18 1803 09/20/18 1903  BP: 130/66 108/62  Pulse: 85 87  Resp: 16 14  Temp:    SpO2: 94% 92%    Last Pain:  Vitals:   09/20/18 1237  TempSrc: Temporal  PainSc: 0-No pain                 [name]

## 2018-09-21 NOTE — ED Notes (Signed)
[facility] at bedside 

## 2018-09-21 NOTE — Telephone Encounter (Signed)
Pt reports right thigh swelling post ablation yesterday.  States groin area looks fine. Advised to go to [facility] for evaluation per Dr. Curt Bears. Patient verbalized understanding and agreeable to plan.

## 2018-09-21 NOTE — H&P (Signed)
Cardiology Admission History and Physical:   Patient ID: Gregory Davila MRN: [id number]; DOB: 02-22-1940   Admission date: 09/21/2018  Primary Care Provider: Kela Millin, MD Primary Cardiologist: Tonny Bollman, MD  Primary Electrophysiologist:  Will Jorja Loa, MD   Chief Complaint:  Leg swelling  Patient Profile:   Gregory Davila is a 78 y.o. male with pAF s/p ablation 09/20/18 on eliquis who presents with groin pain and swelling found to have pseudoaneurysm with AV fistula.   History of Present Illness:   Mr. Gregory Davila underwent uncomplicated RFA yesterday. Post procedure he was noted to have a 5/5 cm area of induration that improved after 15 minutes of manual pressure. He was discharged yesterday post-procedure. This morning he awoke and noted his R thigh to be swollen and painful. He denied numbness or tingling and had no weakness in that leg. He called Dr. Elberta Fortis who advised him to present to the ER for evaluation.   Upon arrival, he was normotensive with Hgb 12 (from 15 prior). 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 There was an AV fistula in that same area. He was transferred to [facility] for possible vascular intervention in the AM.   At the time of my interview, patient awake and in no distress. Pain in leg is rated as an 8/10. ROS otherwise negative. Of note, his last dose of Eliquis was this morning.   Past Medical History:  Diagnosis Date   Anginal pain (HCC)    CAD (coronary artery disease)    a, 2011: occluded LAD with left to right collaterals and moderate disease in the right coronary artery and circumflex, EF 50%. b. NSTEMI 07/2011 in setting of AF-RVR, MV abnl, cath w/ LAD 100%, RCA mod-severe dz, small vessel, med rx   Chronic renal insufficiency    followed by primary care physician   Gout    Hypertension     long-standing   Myocardial infarction [facility])    Paroxysmal atrial fibrillation (HCC)    a. On Coumadin therapy/amidarone. b. 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   Pulmonary embolism (HCC)     Past Surgical History:  Procedure Laterality Date   APPENDECTOMY     ATRIAL FIBRILLATION ABLATION N/A 09/20/2018   Procedure: ATRIAL FIBRILLATION ABLATION;  Surgeon: Regan Lemming, MD;  Location: [facility] INVASIVE CV LAB;  Service: Cardiovascular;  Laterality: N/A;   CARDIAC CATHETERIZATION  2011 & 2013   2013 [place], LAD 100%, CFX 50%, RCA 80%, small vessel, med rx   CORONARY ARTERY BYPASS GRAFT N/A 02/14/2014   Procedure: CORONARY ARTERY BYPASS GRAFTING (CABG)X4 LIMA-LAD; SVG-RCA(ENDARDERECTOMY); SVG-OM; SVG-DIAG;  Surgeon: Kerin Perna, MD;  Location: [facility] OR;  Service: Open Heart Surgery;  Laterality: N/A;   LEFT HEART CATHETERIZATION WITH CORONARY ANGIOGRAM N/A 02/08/2014   Procedure: LEFT HEART CATHETERIZATION WITH CORONARY ANGIOGRAM;  Surgeon: Micheline Chapman, MD;  Location: [facility] CATH LAB;  Service: Cardiovascular;  Laterality: N/A;   NM MYOVIEW LTD  2013   Scar in the anteroseptal/periapical distribution with moderate peri-infarct ischemia, EF 43%   STERNAL INCISION RECLOSURE N/A 02/28/2014   Procedure: Irrigation and Debridement of Sternum with Sternal Rewiring;  Surgeon: Kerin Perna, MD;  Location: [facility] OR;  Service: Thoracic;  Laterality: N/A;   TEE WITHOUT CARDIOVERSION N/A 02/14/2014   Procedure: TRANSESOPHAGEAL ECHOCARDIOGRAM (TEE);  Surgeon: Theron Arista  Donata ClayVan Trigt, MD;  [facility];  Service: Open Heart Surgery;  Laterality: N/A;   TOTAL KNEE ARTHROPLASTY Bilateral      Medications Prior to Admission: Prior to Admission medications   Medication Sig Start Date End Date Taking? Authorizing Provider  allopurinol (ZYLOPRIM) 300 MG tablet Take 0.5 tablets (150 mg total) by mouth daily. 03/05/14  Yes [name], Gina L, PA-C  amiodarone (PACERONE) 200  MG tablet Take 1 tablet (200 mg total) by mouth daily. 05/18/18  Yes Tonny Bollmanooper, Michael, MD  apixaban (ELIQUIS) 5 MG TABS tablet Take 1 tablet (5 mg total) by mouth 2 (two) times daily. 05/22/18  Yes Tonny Bollmanooper, Michael, MD  aspirin EC 81 MG tablet Take 81 mg by mouth daily.   Yes [provider]  atorvastatin (LIPITOR) 40 MG tablet Take 1 tablet (40 mg total) by mouth daily at 6 PM. 03/05/14  Yes [name], Gina L, PA-C  calcium carbonate (TUMS - DOSED IN MG ELEMENTAL CALCIUM) 500 MG chewable tablet Chew 2 tablets by mouth daily as needed for indigestion or heartburn.   Yes [provider]  carvedilol (COREG) 12.5 MG tablet Take 1 tablet (12.5 mg total) by mouth 2 (two) times daily. 05/18/18  Yes Tonny Bollmanooper, Michael, MD  chlorthalidone (HYGROTON) 25 MG tablet Take 1 tablet (25 mg total) by mouth daily. 06/21/18 06/16/19 Yes Tonny Bollmanooper, Michael, MD  COLCRYS 0.6 MG tablet Take 0.6 mg by mouth 2 (two) times daily as needed (gout).  11/24/10  Yes [provider]  diltiazem (CARDIZEM) 30 MG tablet Take 15-30 mg by mouth 2 (two) times daily as needed (increased HR).    Yes [provider]  fluticasone (FLONASE) 50 MCG/ACT nasal spray Place 1 spray into both nostrils daily as needed for allergies or rhinitis.   Yes [provider]  ibuprofen (ADVIL) 200 MG tablet Take 400 mg by mouth every 6 (six) hours as needed for headache or moderate pain.   Yes [provider]  levothyroxine (SYNTHROID) 50 MCG tablet Take 50 mcg by mouth daily before breakfast.  08/30/18 08/30/19 Yes [provider]  loratadine (CLARITIN) 10 MG tablet Take 10 mg by mouth daily.   Yes [provider]  multivitamin [facility](THERAGRAN) per tablet Take 1 tablet by mouth daily.     Yes [provider]  Naphazoline HCl (CLEAR EYES OP) Place 1 drop into both eyes daily as needed (irritation).   Yes [provider]  nitroGLYCERIN (NITROSTAT) 0.4 MG SL tablet Place 1 tablet (0.4 mg total)  under the tongue every 5 (five) minutes x 3 doses as needed for chest pain. 04/30/13  Yes Tonny Bollmanooper, Michael, MD     Allergies:    Allergies  Allergen Reactions   Azithromycin Nausea And Vomiting    "throws for a loop"   Oxycodone Other (See Comments)    nightmares    Social History:   Social History   Socioeconomic History   Marital status: Married    Spouse name: Not on file   Number of children: Not on file   Years of education: Not on file   Highest education level: Not on file  Occupational History   Occupation: Retired  Ecologistocial Needs   Financial resource strain: Not on file   Food insecurity    Worry: Not on file    Inability: Not on file   Transportation needs    Medical: Not on file    Non-medical: Not on file  Tobacco Use   Smoking status: Former  Smoker    Types: Cigarettes    Quit date: 01/04/1966    Years since quitting: 52.7   Smokeless tobacco: Former NeurosurgeonUser    Types: Chew    Quit date: 01/05/2007   Tobacco comment: quit chewing tobacco about 4 yrs ago  Substance and Sexual Activity   Alcohol use: Yes    Comment: "seldomly drink beer" 1 every 2-3 weeks   Drug use: No   Sexual activity: Yes  Lifestyle   Physical activity    Days per week: Not on file    Minutes per session: Not on file   Stress: Not on file  Relationships   Social connections    Talks on phone: Not on file    Gets together: Not on file    Attends religious service: Not on file    Active member of club or organization: Not on file    Attends meetings of clubs or organizations: Not on file    Relationship status: Not on file   Intimate partner violence    Fear of current or ex partner: Not on file    Emotionally abused: Not on file    Physically abused: Not on file    Forced sexual activity: Not on file  Other Topics Concern   Not on file  Social History Narrative   Not on file    Family History:  The patient's family history includes Cancer in his mother;  Coronary artery disease in his brother; Heart attack in his mother; Prostate cancer in his father.    Review of Systems: [y] = yes, [ ]  = no     General: Weight gain [ ] ; Weight loss [ ] ; Anorexia [ ] ; Fatigue [ ] ; Fever [ ] ; Chills [ ] ; Weakness [ ]    Cardiac: Chest pain/pressure [ ] ; Resting SOB [ ] ; Exertional SOB [ ] ; Orthopnea [ ] ; Pedal Edema [ ] ; Palpitations [ ] ; Syncope [ ] ; Presyncope [ ] ; Paroxysmal nocturnal dyspnea[ ]    Pulmonary: Cough [ ] ; Wheezing[ ] ; Hemoptysis[ ] ; Sputum [ ] ; Snoring [ ]    GI: Vomiting[ ] ; Dysphagia[ ] ; Melena[ ] ; Hematochezia [ ] ; Heartburn[ ] ; Abdominal pain [ ] ; Constipation [ ] ; Diarrhea [ ] ; BRBPR [ ]    GU: Hematuria[ ] ; Dysuria [ ] ; Nocturia[ ]    Vascular: Pain in legs with walking [ ] ; Pain in feet with lying flat [ ] ; Non-healing sores [ ] ; Stroke [ ] ; TIA [ ] ; Slurred speech [ ] ;   Neuro: Headaches[ ] ; Vertigo[ ] ; Seizures[ ] ; Paresthesias[ ] ;Blurred vision [ ] ; Diplopia [ ] ; Vision changes [ ]    Ortho/Skin: Arthritis [ ] ; Joint pain [ ] ; Muscle pain [ ] ; Joint swelling [ ] ; Back Pain [ ] ; Rash [ ]    Psych: Depression[ ] ; Anxiety[ ]    Heme: Bleeding problems [ ] ; Clotting disorders [ ] ; Anemia [ ]    Endocrine: Diabetes [ ] ; Thyroid dysfunction[ ]   Physical Exam/Data:   Vitals:   09/21/18 1900 09/21/18 1930 09/21/18 2038 09/21/18 2138  BP: 117/73 106/64 121/76 110/70  Pulse: 77 80 82 98  Resp: 16 13 14 15   Temp:    99 F (37.2 C)  TempSrc:    Oral  SpO2: 99%   98%  Weight:    103.5 kg  Height:    5\' 11"  (1.803 m)   No intake or output data in the 24 hours ending 09/21/18 2244 Filed Weights   09/21/18 1029 09/21/18 2138  Weight: 99 kg 103.5 kg  Body mass index is 31.82 kg/m.  General:  Well nourished, well developed, in no acute distress HEENT: normal Lymph: no adenopathy Neck: no JVD Endocrine:  No thryomegaly Vascular: No carotid bruits; FA pulses 2+. Faint bruit appreciated on R. Large area of ecchymosis and induration  on R thigh extending toward R knee (marked). 2+ DP pulses bilaterally.  Cardiac:  normal S1, S2; RRR; no murmur  Lungs:  clear to auscultation bilaterally, no wheezing, rhonchi or rales  Abd: soft, nontender, no hepatomegaly  Ext: no edema Musculoskeletal:  No deformities, BUE and BLE strength normal and equal Skin: warm and dry  Neuro:  CNs 2-12 intact, no focal abnormalities noted Psych:  Normal affect   EKG:  The ECG that was done  was personally reviewed and demonstrates SR, normal axis, normal intervals. Low voltage.   Relevant CV Studies: TTE 07/2018: 1. The left ventricle has normal systolic function, with an ejection fraction of 55-60%. The cavity size was normal. There is mild concentric left ventricular hypertrophy. Left ventricular diastolic Doppler parameters are consistent with impaired  relaxation. No evidence of left ventricular regional wall motion abnormalities.  2. The right ventricle has mildly reduced systolic function. The cavity was normal. There is mildly increased right ventricular wall thickness.  3. The mitral valve is degenerative. Mild thickening of the mitral valve leaflet. There is mild mitral annular calcification present.  4. The tricuspid valve is grossly normal.  5. The aortic valve is tricuspid. Mild thickening of the aortic valve. Moderate calcification of the aortic valve. Aortic valve regurgitation is moderate by color flow Doppler. Mild stenosis of the aortic valve.  6. Severe aortic annular calcification.  7. There is mild dilatation of the aortic root measuring 40 mm.  Laboratory Data: Chemistry Recent Labs  Lab 09/21/18 1421  NA 136  K 4.0  CL 106  CO2 21*  GLUCOSE 141*  BUN 36*  CREATININE 1.94*  CALCIUM 8.4*  GFRNONAA 32*  GFRAA 38*  ANIONGAP 9    No results for input(s): PROT, ALBUMIN, AST, ALT, ALKPHOS, BILITOT in the last 168 hours. Hematology Recent Labs  Lab 09/21/18 1421  WBC 15.3*  RBC 3.96*  HGB 12.3*  HCT 38.5*  MCV  97.2  MCH 31.1  MCHC 31.9  RDW 13.8  PLT 197   Cardiac EnzymesNo results for input(s): TROPONINI in the last 168 hours. No results for input(s): TROPIPOC in the last 168 hours.  BNPNo results for input(s): BNP, PROBNP in the last 168 hours.  DDimer No results for input(s): DDIMER in the last 168 hours.  Radiology/Studies:  [place] Lower Ext Art Right Ltd  Result Date: 09/21/2018 CLINICAL DATA:  Possible pseudo aneurysm after catheterization. EXAM: Right LOWER EXTREMITY ARTERIAL DUPLEX SCAN TECHNIQUE: Gray-scale sonography as well as color Doppler and duplex ultrasound was performed to evaluate the lower extremity arteries including the common, superficial and profunda femoral arteries, popliteal artery and calf arteries. COMPARISON:  None. FINDINGS: Sonographic and Doppler evaluation of the right groin and upper thigh region was performed. This demonstrates large complex fluid collection in the right groin region with layering blood or heme products consistent with hematoma. Some pulsatile movement of the blood is noted suggesting pseudoaneurysm. Doppler also demonstrates arterial flow within the pseudoaneurysm that a synchronous with arterial flow in the adjacent right common femoral artery. The pseudoaneurysm and hematoma are large. There does appear to be a neck extending from the right common femoral artery. The right common femoral artery is patent. There is  also noted simultaneous flow in the adjacent right common femoral vein suggesting arteriovenous fistula. 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 Electronically Signed   By: Marijo Conception M.D.   On: 09/21/2018 14:25    Assessment and Plan:   Mr. Bonnette is a 78 year old man who presents with R common femoral pseudoaneurysm and  AV fistula following atrial fibrillation ablation.   -- Hold ASA, eliquis given size of hematoma/pseudoaneurysm, downtrending Hgb, and planned vascular procedure. -- Continue carvedilol 12.5mg  BID and amiodarone for AF. -- On home synthroid, atorvastatin -- Pain control with tylenol. -- Consult to vascular surgery in the AM; NPO @ [facility] for possible procedure. -- CBC, coags, T&S tonight in preparation  Severity of Illness: The appropriate patient status for this patient is INPATIENT. Inpatient status is judged to be reasonable and necessary in order to provide the required intensity of service to ensure the patient's safety. The patient's presenting symptoms, physical exam findings, and initial radiographic and laboratory data in the context of their chronic comorbidities is felt to place them at high risk for further clinical deterioration. Furthermore, it is not anticipated that the patient will be medically stable for discharge from the hospital within 2 midnights of admission. The following factors support the patient status of inpatient.   " The patient's presenting symptoms include R leg swelling. " The worrisome physical exam findings include hematoma. " The initial radiographic and laboratory data are worrisome because of anemia " The chronic co-morbidities include atrial fibrillation  * I certify that at the point of admission it is my clinical judgment that the patient will require inpatient hospital care spanning beyond 2 midnights from the point of admission due to high intensity of service, high risk for further deterioration and high frequency of surveillance required.*    For questions or updates, please contact [place] Please consult [email or web address] for contact info under     Signed, Milus Banister, MD  09/21/2018 10:44 PM

## 2018-09-21 NOTE — ED Triage Notes (Signed)
Pt reports had a cardiac [facility] at [facility] yesterday and reports was done through r groin.   pain and swelling in r thigh.  Area bruised.    Pt says he called the cardiologist and they told him to come to the ER.

## 2018-09-21 NOTE — ED Notes (Signed)
Attempted to call report to 4E at [facility]. RN will return call.

## 2018-09-21 NOTE — ED Notes (Signed)
Patient transported to Ultrasound 

## 2018-09-21 NOTE — ED Provider Notes (Addendum)
[facility] EMERGENCY DEPARTMENT Provider Note   CSN: [id number] Arrival date & time: 09/21/18  1019   History   Chief Complaint Chief Complaint  Patient presents with  . Leg Swelling    HPI Gregory Davila is a 78 y.o. male on Eliquis 5 mg 2 times daily for history of paroxysmal A. fib, pulmonary embolism, and myocardial infarction. Yesterday he had an ablation for atrial fibrillation and shortly after the procedure presented to have right groin swelling.  Today he is presenting with right upper thigh swelling, pain, and hematoma.  Patient states he woke up this morning and felt pain/pressure in his right upper thigh, felt increased tightening when he was up and ambulating.  He contacted his doctor's office who recommended he be seen and evaluated for right thigh swelling, bruising, and hematoma.  He took his Eliquis both last night and this morning.   He denies any fevers, headaches, chills, chest pain, shortness of breath, nausea, vomiting, abdominal pain, rashes, numbness or tingling in his extremities, and focal neurological deficits.   Past Medical History:  Diagnosis Date  . Anginal pain (HCC)   . CAD (coronary artery disease)    a, 2011: occluded LAD with left to right collaterals and moderate disease in the right coronary artery and circumflex, EF 50%. b. NSTEMI 07/2011 in setting of AF-RVR, MV abnl, cath w/ LAD 100%, RCA mod-severe dz, small vessel, med rx  . Chronic renal insufficiency    followed by primary care physician  . Gout   . Hypertension    long-standing  . Myocardial infarction (HCC)   . Paroxysmal atrial fibrillation (HCC)    a. On Coumadin therapy/amidarone. b. 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  . Pulmonary embolism [facility])     Patient Active Problem List   Diagnosis Date Noted  . CKD (chronic kidney disease) stage 3, GFR 30-59 ml/min (HCC) 07/21/2018  . Neck pain 07/21/2018  . Atherosclerotic heart disease of native  coronary artery without angina pectoris 07/21/2018  . Gout 02/16/2017  . History of pulmonary embolism 02/16/2017  . Essential hypertension 02/16/2017  . Chronic combined systolic and diastolic CHF (congestive heart failure) (HCC) 01/27/2016  . Pure hypercholesterolemia 01/27/2016  . 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 History of non-ST elevation myocardial infarction (NSTEMI) 07/13/2011  . Atrial fibrillation with RVR (HCC) 07/12/2011  . Persistent atrial fibrillation   . Coronary artery disease involving native coronary artery of native heart without angina pectoris   . Hypertension   . Renal insufficiency   . Pulmonary embolism (HCC)   . History of amiodarone therapy 07/27/2010  . Long term (current) use of anticoagulants 04/13/2010  . EDEMA 11/20/2009  . Chronic rhinitis 02/04/2009  . GERD (gastroesophageal reflux disease) 11/05/2006    Past Surgical History:  Procedure Laterality Date  . APPENDECTOMY    . ATRIAL FIBRILLATION ABLATION N/A 09/20/2018   Procedure: ATRIAL FIBRILLATION ABLATION;  Surgeon: Regan Lemming, MD;  Location: [facility] INVASIVE CV LAB;  Service: Cardiovascular;  Laterality: N/A;  . CARDIAC CATHETERIZATION  2011 & 2013   [place], LAD 100%, CFX 50%, RCA 80%, small vessel, med rx  . CORONARY ARTERY BYPASS GRAFT N/A 02/14/2014   Procedure: CORONARY ARTERY BYPASS GRAFTING (CABG)X4 LIMA-LAD; SVG-RCA(ENDARDERECTOMY); SVG-OM; SVG-DIAG;  Surgeon: Kerin Perna, MD;  Location: [facility] OR;  Service: Open Heart Surgery;  Laterality: N/A;  . LEFT HEART CATHETERIZATION WITH CORONARY ANGIOGRAM N/A 02/08/2014   Procedure: LEFT HEART CATHETERIZATION  WITH CORONARY ANGIOGRAM;  Surgeon: Micheline ChapmanMichael D Cooper, MD;  [facility];  Service: Cardiovascular;  Laterality: N/A;  . NM MYOVIEW LTD  2013   Scar in the anteroseptal/periapical distribution with moderate peri-infarct ischemia, EF 43%  . STERNAL INCISION RECLOSURE N/A 02/28/2014   Procedure: Irrigation and Debridement of Sternum  with Sternal Rewiring;  Surgeon: Kerin PernaPeter Van Trigt, MD;  [facility];  Service: Thoracic;  Laterality: N/A;  . TEE WITHOUT CARDIOVERSION N/A 02/14/2014   Procedure: TRANSESOPHAGEAL ECHOCARDIOGRAM (TEE);  Surgeon: Kerin PernaPeter Van Trigt, MD;  [facility];  Service: Open Heart Surgery;  Laterality: N/A;  . TOTAL KNEE ARTHROPLASTY Bilateral      Home Medications    Prior to Admission medications   Medication Sig Start Date End Date Taking? Authorizing Provider  allopurinol (ZYLOPRIM) 300 MG tablet Take 0.5 tablets (150 mg total) by mouth daily. 03/05/14   [name], Debby FreibergGina L, PA-C  amiodarone (PACERONE) 200 MG tablet Take 1 tablet (200 mg total) by mouth daily. 05/18/18   Tonny Bollmanooper, Michael, MD  apixaban (ELIQUIS) 5 MG TABS tablet Take 1 tablet (5 mg total) by mouth 2 (two) times daily. 05/22/18   Tonny Bollmanooper, Michael, MD  aspirin EC 81 MG tablet Take 81 mg by mouth daily.    [provider]  atorvastatin (LIPITOR) 40 MG tablet Take 1 tablet (40 mg total) by mouth daily at 6 PM. 03/05/14   Thomasena Edisollins, Almira CoasterGina L, PA-C  calcium carbonate (TUMS - DOSED IN MG ELEMENTAL CALCIUM) 500 MG chewable tablet Chew 2 tablets by mouth daily as needed for indigestion or heartburn.    [provider]  carvedilol (COREG) 12.5 MG tablet Take 1 tablet (12.5 mg total) by mouth 2 (two) times daily. 05/18/18   Tonny Bollmanooper, Michael, MD  chlorthalidone (HYGROTON) 25 MG tablet Take 1 tablet (25 mg total) by mouth daily. 06/21/18 06/16/19  Tonny Bollmanooper, Michael, MD  COLCRYS 0.6 MG tablet Take 0.6 mg by mouth 2 (two) times daily as needed (gout).  11/24/10   [provider]  diltiazem (CARDIZEM) 30 MG tablet Take 15-30 mg by mouth 2 (two) times daily as needed (increased HR).     [provider]  fluticasone (FLONASE) 50 MCG/ACT nasal spray Place 1 spray into both nostrils daily as needed for allergies or rhinitis.    [provider]  ibuprofen (ADVIL) 200 MG tablet Take 400 mg by mouth every 6 (six) hours as needed for  headache or moderate pain.    [provider]  levothyroxine (SYNTHROID) 50 MCG tablet Take 50 mcg by mouth daily before breakfast.  08/30/18 08/30/19  [provider]  loratadine (CLARITIN) 10 MG tablet Take 10 mg by mouth daily.    [provider]  multivitamin [facility](THERAGRAN) per tablet Take 1 tablet by mouth daily.      [provider]  Naphazoline HCl (CLEAR EYES OP) Place 1 drop into both eyes daily as needed (irritation).    [provider]  nitroGLYCERIN (NITROSTAT) 0.4 MG SL tablet Place 1 tablet (0.4 mg total) under the tongue every 5 (five) minutes x 3 doses as needed for chest pain. 04/30/13   Tonny Bollmanooper, Michael, MD    Family History Family History  Problem Relation Age of Onset  . Heart attack Mother   . Cancer Mother   . Prostate cancer Father   . Coronary artery disease Brother        MI    Social History Social History   Tobacco Use  .  Smoking status: Former Smoker    Types: Cigarettes    Quit date: 01/04/1966    Years since quitting: 52.7  . Smokeless tobacco: Former Systems developer    Types: Chew    Quit date: 01/05/2007  . Tobacco comment: quit chewing tobacco about 4 yrs ago  Substance Use Topics  . Alcohol use: Yes    Comment: "seldomly drink beer" 1 every 2-3 weeks  . Drug use: No     Allergies   Azithromycin and Oxycodone   Review of Systems Review of Systems - see HPI   Physical Exam Updated Vital Signs BP 117/74 (BP Location: Left Arm)   Pulse 94   Temp 98.2 F (36.8 C) (Oral)   Resp 18   Ht 5\' 11"  (1.803 m)   Wt 99 kg   SpO2 96%   BMI 30.44 kg/m   Physical Exam Constitutional:      General: He is not in acute distress.    Appearance: Normal appearance. He is not ill-appearing.  HENT:     Mouth/Throat:     Mouth: Mucous membranes are moist.  Eyes:     Extraocular Movements: Extraocular movements intact.  Neck:     Musculoskeletal: Normal range of motion.  Cardiovascular:     Rate and Rhythm: Normal rate  and regular rhythm.     Pulses: Normal pulses.     Heart sounds: Normal heart sounds.  Pulmonary:     Effort: Pulmonary effort is normal. No respiratory distress.     Breath sounds: Normal breath sounds.  Abdominal:     General: Bowel sounds are normal.     Palpations: Abdomen is soft.  Musculoskeletal:        General: Swelling (Firm swelling to upper medial and anterior thigh) and tenderness (To palpation over right upper anterior and medial thigh) present.  Lymphadenopathy:     Cervical: No cervical adenopathy.  Skin:    General: Skin is warm.     Capillary Refill: Capillary refill takes less than 2 seconds.     Findings: Bruising (Right upper medial thigh near crease of leg) present.  Neurological:     General: No focal deficit present.     Mental Status: He is alert.       ED Treatments / Results  Labs (all labs ordered are listed, but only abnormal results are displayed) Labs Reviewed - No data to display  EKG None  Radiology No results found.  Procedures Procedures (including critical care time)  Medications Ordered in ED Medications - No data to display   Initial Impression / Assessment and Plan / ED Course  I have reviewed the triage vital signs and the nursing notes.  Pertinent labs & imaging results that were available during my care of the patient were reviewed by me and considered in my medical decision making (see chart for details).  Right upper thigh swelling: Concerning for hematoma s/p right groin access for atrial ablation yesterday.  Patient took his Eliquis both last night and this morning.  He denies any trauma or falls.  Thigh swelling is most likely due to hematoma or extensive bruising from yesterday's procedure. No concern for traumatic cause of thigh swelling or ecchymosis. -evaluate ecchymosis with right lower extremity soft tissue and vascular ultrasound -BMP, CBC   Final Clinical Impressions(s) / ED Diagnoses   Final diagnoses:  None     ED Discharge Orders    None      Milus Banister, [place],  PGY-2 09/21/2018 11:06 AM    Dollene ClevelandAnderson, Hannah C, DO 09/21/18 1325    Peggyann ShoalsAnderson, Hannah C, DO 09/21/18 1545    Blane OharaZavitz, Joshua, MD 09/23/18 0131

## 2018-09-21 NOTE — ED Notes (Signed)
ED TO INPATIENT HANDOFF REPORT  ED Nurse Name and Phone #: [phone]  S Name/Age/Gender Gregory Davila 78 y.o. male Room/Bed: APOTF/OTF  Code Status   Code Status: Prior  Home/SNF/Other Home Patient oriented to: self Is this baseline? Yes   Triage Complete: Triage complete  Chief Complaint Leg Swelling  Triage Note Pt reports had a cardiac [facility] at [facility] yesterday and reports was done through r groin.   pain and swelling in r thigh.  Area bruised.    Pt says he called the cardiologist and they told him to come to the ER.   Allergies Allergies  Allergen Reactions  . Azithromycin Nausea And Vomiting    "throws for a loop"  . Oxycodone Other (See Comments)    nightmares    Level of Care/Admitting Diagnosis ED Disposition    ED Disposition Condition Comment   Admit  Hospital Area: [place] [[id number]]  Level of Care: Telemetry Cardiac [103]  Covid Evaluation: Asymptomatic Screening Protocol (No Symptoms)  Diagnosis: Pseudoaneurysm following procedure [facility]) [[id number]]  Admitting Physician: Geralynn Rile [[id number]]  Attending Physician: Geralynn Rile [phone]  PT Class (Do Not Modify): Observation [104]  PT Acc Code (Do Not Modify): Observation [10022]       B Medical/Surgery History Past Medical History:  Diagnosis Date  . Anginal pain ([place])   . CAD (coronary artery disease)    a, 2011: occluded LAD with left to right collaterals and moderate disease in the right coronary artery and circumflex, EF 50%. b. NSTEMI 07/2011 in setting of AF-RVR, MV abnl, cath w/ LAD 100%, RCA mod-severe dz, small vessel, med rx  . Chronic renal insufficiency    followed by primary care physician  . Gout   . Hypertension    long-standing  . Myocardial infarction ([place])   . Paroxysmal atrial fibrillation ([place])    a. On Coumadin therapy/amidarone. b. 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  . Pulmonary embolism [facility])     Past Surgical History:  Procedure Laterality Date  . APPENDECTOMY    . ATRIAL FIBRILLATION ABLATION N/A 09/20/2018   Procedure: ATRIAL FIBRILLATION ABLATION;  Surgeon: Constance Haw, MD;  Location: [place] CV LAB;  Service: Cardiovascular;  Laterality: N/A;  . CARDIAC CATHETERIZATION  2011 & 2013   [place], LAD 100%, CFX 50%, RCA 80%, small vessel, med rx  . CORONARY ARTERY BYPASS GRAFT N/A 02/14/2014   Procedure: CORONARY ARTERY BYPASS GRAFTING (CABG)X4 LIMA-LAD; SVG-RCA(ENDARDERECTOMY); SVG-OM; SVG-DIAG;  Surgeon: Ivin Poot, MD;  Location: [place];  Service: Open Heart Surgery;  Laterality: N/A;  . LEFT HEART CATHETERIZATION WITH CORONARY ANGIOGRAM N/A 02/08/2014   Procedure: LEFT HEART CATHETERIZATION WITH CORONARY ANGIOGRAM;  Surgeon: Blane Ohara, MD;  Location: [facility] CATH LAB;  Service: Cardiovascular;  Laterality: N/A;  . NM MYOVIEW LTD  2013   Scar in the anteroseptal/periapical distribution with moderate peri-infarct ischemia, EF 43%  . STERNAL INCISION RECLOSURE N/A 02/28/2014   Procedure: Irrigation and Debridement of Sternum with Sternal Rewiring;  Surgeon: Ivin Poot, MD;  Location: [place];  Service: Thoracic;  Laterality: N/A;  . TEE WITHOUT CARDIOVERSION N/A 02/14/2014   Procedure: TRANSESOPHAGEAL ECHOCARDIOGRAM (TEE);  Surgeon: Ivin Poot, MD;  Location: [place];  Service: Open Heart Surgery;  Laterality: N/A;  . TOTAL KNEE ARTHROPLASTY Bilateral      A IV Location/Drains/Wounds Patient Lines/Drains/Airways Status   Active Line/Drains/Airways    Name:   Placement  date:   Placement time:   Site:   Days:   Peripheral IV 09/21/18 Left Forearm   09/21/18    1819    Forearm   less than 1   Closed System Drain 1 Medial Chest Bulb (JP) 15 Fr.   02/28/14    1229    Chest   1666   Incision (Closed) 02/14/14 Chest Other (Comment)   02/14/14    1318     1680   Incision (Closed) 02/14/14 Leg Right   02/14/14    -     1680   Incision (Closed) 02/28/14 Other  (Comment) Other (Comment)   02/28/14    1303     1666   Wound / Incision (Open or Dehisced) 02/08/14 Other (Comment) Arm Left LFA skin tear   02/08/14    -    Arm   1686   Wound / Incision (Open or Dehisced) 02/22/14 Other (Comment) Ear Left crack from wearing O2   02/22/14    1408    Ear   1672          Intake/Output Last 24 hours No intake or output data in the 24 hours ending 09/21/18 2054  Labs/Imaging Results for orders placed or performed during the hospital encounter of 09/21/18 (from the past 48 hour(s))  CBC     Status: Abnormal   Collection Time: 09/21/18  2:21 PM  Result Value Ref Range   WBC 15.3 (H) 4.0 - 10.5 K/uL   RBC 3.96 (L) 4.22 - 5.81 MIL/uL   Hemoglobin 12.3 (L) 13.0 - 17.0 g/dL   HCT 40.938.5 (L) 81.139.0 - 91.452.0 %   MCV 97.2 80.0 - 100.0 fL   MCH 31.1 26.0 - 34.0 pg   MCHC 31.9 30.0 - 36.0 g/dL   RDW 78.213.8 95.611.5 - 21.315.5 %   Platelets 197 150 - 400 K/uL   nRBC 0.0 0.0 - 0.2 %    Comment: Performed at [facility], [address] [place], [place] [address]  Basic metabolic panel     Status: Abnormal   Collection Time: 09/21/18  2:21 PM  Result Value Ref Range   Sodium 136 135 - 145 mmol/L   Potassium 4.0 3.5 - 5.1 mmol/L   Chloride 106 98 - 111 mmol/L   CO2 21 (L) 22 - 32 mmol/L   Glucose, Bld 141 (H) 70 - 99 mg/dL   BUN 36 (H) 8 - 23 mg/dL   Creatinine, Ser 8.461.94 (H) 0.61 - 1.24 mg/dL   Calcium 8.4 (L) 8.9 - 10.3 mg/dL   GFR calc non Af Amer 32 (L) >60 mL/min   GFR calc Af Amer 38 (L) >60 mL/min   Anion gap 9 5 - 15    Comment: Performed at [facility], [address] [place], [place] [address]   [place] Lower Ext Art Right Ltd  Result Date: 09/21/2018 CLINICAL DATA:  Possible pseudo aneurysm after catheterization. EXAM: Right LOWER EXTREMITY ARTERIAL DUPLEX SCAN TECHNIQUE: Gray-scale sonography as well as color Doppler and duplex ultrasound was performed to evaluate the lower extremity arteries including the common, superficial and profunda femoral arteries,  popliteal artery and calf arteries. COMPARISON:  None. FINDINGS: Sonographic and Doppler evaluation of the right groin and upper thigh region was performed. This demonstrates large complex fluid collection in the right groin region with layering blood or heme products consistent with hematoma. Some pulsatile movement of the blood is noted suggesting pseudoaneurysm. Doppler also demonstrates arterial flow within the pseudoaneurysm  that a synchronous with arterial flow in the adjacent right common femoral artery. The pseudoaneurysm and hematoma are large. There does appear to be a neck extending from the right common femoral artery. The right common femoral artery is patent. There is also noted simultaneous flow in the adjacent right common femoral vein suggesting arteriovenous fistula. 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 Electronically Signed   By: Lupita Raider M.D.   On: 09/21/2018 14:25    Pending Labs Unresulted Labs (From admission, onward)    Start     Ordered   09/21/18 1750  SARS CORONAVIRUS 2 (TAT 6-24 HRS) Nasopharyngeal Nasopharyngeal Swab  (Asymptomatic/Tier 2 Patients Labs)  Once,   STAT    Question Answer Comment  Is this test for diagnosis or screening Screening   Symptomatic for COVID-19 as defined by CDC No   Hospitalized for COVID-19 No   Admitted to ICU for COVID-19 No   Previously tested for COVID-19 Yes   Resident in a congregate (group) care setting No   Employed in healthcare setting No      09/21/18 1750          Vitals/Pain Today's Vitals   09/21/18 1830 09/21/18 1900 09/21/18 1930 09/21/18 2038  BP: 105/67 117/73 106/64 121/76  Pulse: 76 77 80 82  Resp: 14 16 13 14   Temp:      TempSrc:      SpO2:  99%    Weight:      Height:       PainSc:        Isolation Precautions No active isolations  Medications Medications  acetaminophen (TYLENOL) tablet 650 mg (650 mg Oral Given 09/21/18 1858)    Mobility walks Low fall risk   Focused Assessments    R Recommendations: See Admitting Provider Note  Report given to:   Additional Notes:

## 2018-09-21 NOTE — Telephone Encounter (Signed)
New message   Patient had ablation on yesterday. He states that he has swelling in right leg in his thigh. Please call.

## 2018-09-22 ENCOUNTER — Encounter (HOSPITAL_COMMUNITY)
Admission: EM | Disposition: A | Discharge status: Home / Self Care | Payer: Self-pay | Source: Home / Self Care | Attending: Cardiovascular Disease

## 2018-09-22 ENCOUNTER — Observation Stay (HOSPITAL_COMMUNITY): Payer: Medicare Other | Admitting: Anesthesiology

## 2018-09-22 ENCOUNTER — Encounter (HOSPITAL_COMMUNITY): Payer: Self-pay | Admitting: *Deleted

## 2018-09-22 DIAGNOSIS — Z96653 Presence of artificial knee joint, bilateral: Secondary | ICD-10-CM | POA: Diagnosis not present

## 2018-09-22 DIAGNOSIS — T81718A Complication of other artery following a procedure, not elsewhere classified, initial encounter: Secondary | ICD-10-CM | POA: Diagnosis not present

## 2018-09-22 DIAGNOSIS — M109 Gout, unspecified: Secondary | ICD-10-CM | POA: Diagnosis not present

## 2018-09-22 DIAGNOSIS — I724 Aneurysm of artery of lower extremity: Secondary | ICD-10-CM | POA: Diagnosis not present

## 2018-09-22 DIAGNOSIS — I4819 Other persistent atrial fibrillation: Secondary | ICD-10-CM | POA: Diagnosis not present

## 2018-09-22 DIAGNOSIS — Z7901 Long term (current) use of anticoagulants: Secondary | ICD-10-CM | POA: Diagnosis not present

## 2018-09-22 DIAGNOSIS — I2510 Atherosclerotic heart disease of native coronary artery without angina pectoris: Secondary | ICD-10-CM | POA: Diagnosis not present

## 2018-09-22 DIAGNOSIS — Z20828 Contact with and (suspected) exposure to other viral communicable diseases: Secondary | ICD-10-CM | POA: Diagnosis not present

## 2018-09-22 DIAGNOSIS — D62 Acute posthemorrhagic anemia: Secondary | ICD-10-CM | POA: Diagnosis not present

## 2018-09-22 DIAGNOSIS — Z7982 Long term (current) use of aspirin: Secondary | ICD-10-CM | POA: Diagnosis not present

## 2018-09-22 DIAGNOSIS — N183 Chronic kidney disease, stage 3 (moderate): Secondary | ICD-10-CM | POA: Diagnosis not present

## 2018-09-22 DIAGNOSIS — Z881 Allergy status to other antibiotic agents status: Secondary | ICD-10-CM | POA: Diagnosis not present

## 2018-09-22 DIAGNOSIS — Z87891 Personal history of nicotine dependence: Secondary | ICD-10-CM | POA: Diagnosis not present

## 2018-09-22 DIAGNOSIS — I739 Peripheral vascular disease, unspecified: Secondary | ICD-10-CM | POA: Diagnosis not present

## 2018-09-22 DIAGNOSIS — I252 Old myocardial infarction: Secondary | ICD-10-CM | POA: Diagnosis not present

## 2018-09-22 DIAGNOSIS — Z86711 Personal history of pulmonary embolism: Secondary | ICD-10-CM | POA: Diagnosis not present

## 2018-09-22 DIAGNOSIS — Z79890 Hormone replacement therapy (postmenopausal): Secondary | ICD-10-CM | POA: Diagnosis not present

## 2018-09-22 DIAGNOSIS — Z885 Allergy status to narcotic agent status: Secondary | ICD-10-CM | POA: Diagnosis not present

## 2018-09-22 DIAGNOSIS — Z79899 Other long term (current) drug therapy: Secondary | ICD-10-CM | POA: Diagnosis not present

## 2018-09-22 DIAGNOSIS — I129 Hypertensive chronic kidney disease with stage 1 through stage 4 chronic kidney disease, or unspecified chronic kidney disease: Secondary | ICD-10-CM | POA: Diagnosis not present

## 2018-09-22 DIAGNOSIS — I770 Arteriovenous fistula, acquired: Secondary | ICD-10-CM | POA: Diagnosis not present

## 2018-09-22 DIAGNOSIS — Z8249 Family history of ischemic heart disease and other diseases of the circulatory system: Secondary | ICD-10-CM | POA: Diagnosis not present

## 2018-09-22 DIAGNOSIS — I729 Aneurysm of unspecified site: Secondary | ICD-10-CM | POA: Diagnosis not present

## 2018-09-22 HISTORY — PX: FALSE ANEURYSM REPAIR: SHX5152

## 2018-09-22 LAB — POCT I-STAT EG7
Acid-base deficit: 5 mmol/L — ABNORMAL HIGH (ref 0.0–2.0)
Bicarbonate: 22.4 mmol/L (ref 20.0–28.0)
Calcium, Ion: 1.16 mmol/L (ref 1.15–1.40)
HCT: 29 % — ABNORMAL LOW (ref 39.0–52.0)
Hemoglobin: 9.9 g/dL — ABNORMAL LOW (ref 13.0–17.0)
O2 Saturation: 68 %
Potassium: 4.5 mmol/L (ref 3.5–5.1)
Sodium: 140 mmol/L (ref 135–145)
TCO2: 24 mmol/L (ref 22–32)
pCO2, Ven: 52.9 mmHg (ref 44.0–60.0)
pH, Ven: 7.235 — ABNORMAL LOW (ref 7.250–7.430)
pO2, Ven: 42 mmHg (ref 32.0–45.0)

## 2018-09-22 LAB — MRSA PCR SCREENING: MRSA by PCR: NEGATIVE

## 2018-09-22 LAB — CBC
HCT: 38.3 % — ABNORMAL LOW (ref 39.0–52.0)
Hemoglobin: 12.4 g/dL — ABNORMAL LOW (ref 13.0–17.0)
MCH: 31 pg (ref 26.0–34.0)
MCHC: 32.4 g/dL (ref 30.0–36.0)
MCV: 95.8 fL (ref 80.0–100.0)
Platelets: 185 10*3/uL (ref 150–400)
RBC: 4 MIL/uL — ABNORMAL LOW (ref 4.22–5.81)
RDW: 13.7 % (ref 11.5–15.5)
WBC: 13.9 10*3/uL — ABNORMAL HIGH (ref 4.0–10.5)
nRBC: 0 % (ref 0.0–0.2)

## 2018-09-22 LAB — TYPE AND SCREEN
ABO/RH(D): O POS
Antibody Screen: NEGATIVE

## 2018-09-22 LAB — PROTIME-INR
INR: 1.4 — ABNORMAL HIGH (ref 0.8–1.2)
Prothrombin Time: 16.9 seconds — ABNORMAL HIGH (ref 11.4–15.2)

## 2018-09-22 LAB — SARS CORONAVIRUS 2 (TAT 6-24 HRS): SARS Coronavirus 2: NEGATIVE

## 2018-09-22 SURGERY — REPAIR, PSEUDOANEURYSM
Anesthesia: General | Site: Groin | Laterality: Right

## 2018-09-22 MED ORDER — PANTOPRAZOLE SODIUM 40 MG PO TBEC
40.0000 mg | DELAYED_RELEASE_TABLET | Freq: Every day | ORAL | Status: DC
  Administered 2018-09-22 – 2018-09-26 (×5): 40 mg via ORAL
  Filled 2018-09-22 (×5): qty 1

## 2018-09-22 MED ORDER — SODIUM CHLORIDE 0.9 % IV SOLN
INTRAVENOUS | Status: DC | PRN
  Administered 2018-09-22: 25 ug/min via INTRAVENOUS
  Administered 2018-09-22: 16:00:00 via INTRAVENOUS

## 2018-09-22 MED ORDER — EPHEDRINE SULFATE 50 MG/ML IJ SOLN
INTRAMUSCULAR | Status: DC | PRN
  Administered 2018-09-22 (×5): 10 mg via INTRAVENOUS

## 2018-09-22 MED ORDER — PHENOL 1.4 % MT LIQD: 1.0000 | OROMUCOSAL | Status: DC | PRN

## 2018-09-22 MED ORDER — 0.9 % SODIUM CHLORIDE (POUR BTL) OPTIME
TOPICAL | Status: DC | PRN
  Administered 2018-09-22: 2000 mL

## 2018-09-22 MED ORDER — ROCURONIUM BROMIDE 50 MG/5ML IV SOSY
PREFILLED_SYRINGE | INTRAVENOUS | Status: DC | PRN
  Administered 2018-09-22: 20 mg via INTRAVENOUS
  Administered 2018-09-22: 60 mg via INTRAVENOUS

## 2018-09-22 MED ORDER — PHENYLEPHRINE HCL (PRESSORS) 10 MG/ML IV SOLN
INTRAVENOUS | Status: DC | PRN
  Administered 2018-09-22: 140 ug via INTRAVENOUS
  Administered 2018-09-22: 100 ug via INTRAVENOUS
  Administered 2018-09-22 (×2): 80 ug via INTRAVENOUS

## 2018-09-22 MED ORDER — METOPROLOL TARTRATE 5 MG/5ML IV SOLN: 2.0000 mg | INTRAVENOUS | Status: DC | PRN

## 2018-09-22 MED ORDER — CEFAZOLIN SODIUM-DEXTROSE 2-4 GM/100ML-% IV SOLN
2.0000 g | Freq: Three times a day (TID) | INTRAVENOUS | Status: AC
  Administered 2018-09-22 – 2018-09-23 (×2): 2 g via INTRAVENOUS
  Filled 2018-09-22 (×2): qty 100

## 2018-09-22 MED ORDER — MORPHINE SULFATE (PF) 2 MG/ML IV SOLN: 2.0000 mg | INTRAVENOUS | Status: DC | PRN

## 2018-09-22 MED ORDER — ALUM & MAG HYDROXIDE-SIMETH 200-200-20 MG/5ML PO SUSP: 15.0000 mL | ORAL | Status: DC | PRN

## 2018-09-22 MED ORDER — FENTANYL CITRATE (PF) 100 MCG/2ML IJ SOLN: 25.0000 ug | INTRAMUSCULAR | Status: DC | PRN

## 2018-09-22 MED ORDER — SODIUM CHLORIDE 0.9 % IV SOLN
INTRAVENOUS | Status: AC
  Filled 2018-09-22: qty 1.2

## 2018-09-22 MED ORDER — TRAMADOL HCL 50 MG PO TABS: 50.0000 mg | ORAL_TABLET | Freq: Four times a day (QID) | ORAL | Status: DC | PRN

## 2018-09-22 MED ORDER — HYDRALAZINE HCL 20 MG/ML IJ SOLN: 5.0000 mg | INTRAMUSCULAR | Status: DC | PRN

## 2018-09-22 MED ORDER — SODIUM CHLORIDE 0.9 % IV SOLN: 500.0000 mL | Freq: Once | INTRAVENOUS | Status: DC | PRN

## 2018-09-22 MED ORDER — PROPOFOL 10 MG/ML IV BOLUS
INTRAVENOUS | Status: DC | PRN
  Administered 2018-09-22: 40 mg via INTRAVENOUS
  Administered 2018-09-22: 100 mg via INTRAVENOUS

## 2018-09-22 MED ORDER — FENTANYL CITRATE (PF) 250 MCG/5ML IJ SOLN
INTRAMUSCULAR | Status: AC
  Filled 2018-09-22: qty 5

## 2018-09-22 MED ORDER — LABETALOL HCL 5 MG/ML IV SOLN: 10.0000 mg | INTRAVENOUS | Status: DC | PRN

## 2018-09-22 MED ORDER — VASOPRESSIN 20 UNIT/ML IV SOLN
INTRAVENOUS | Status: DC | PRN
  Administered 2018-09-22 (×2): 1 [IU] via INTRAVENOUS

## 2018-09-22 MED ORDER — FENTANYL CITRATE (PF) 100 MCG/2ML IJ SOLN
INTRAMUSCULAR | Status: DC | PRN
  Administered 2018-09-22: 100 ug via INTRAVENOUS

## 2018-09-22 MED ORDER — LACTATED RINGERS IV SOLN
INTRAVENOUS | Status: DC | PRN
  Administered 2018-09-22: 15:00:00 via INTRAVENOUS

## 2018-09-22 MED ORDER — PROTAMINE SULFATE 10 MG/ML IV SOLN
INTRAVENOUS | Status: DC | PRN
  Administered 2018-09-22: 20 mg via INTRAVENOUS
  Administered 2018-09-22: 10 mg via INTRAVENOUS
  Administered 2018-09-22: 20 mg via INTRAVENOUS

## 2018-09-22 MED ORDER — DOCUSATE SODIUM 100 MG PO CAPS
100.0000 mg | ORAL_CAPSULE | Freq: Every day | ORAL | Status: DC
  Administered 2018-09-23 – 2018-09-26 (×4): 100 mg via ORAL
  Filled 2018-09-22 (×4): qty 1

## 2018-09-22 MED ORDER — EPHEDRINE 5 MG/ML INJ
INTRAVENOUS | Status: AC
  Filled 2018-09-22: qty 10

## 2018-09-22 MED ORDER — GUAIFENESIN-DM 100-10 MG/5ML PO SYRP: 15.0000 mL | ORAL_SOLUTION | ORAL | Status: DC | PRN

## 2018-09-22 MED ORDER — SODIUM CHLORIDE 0.9 % IV SOLN
INTRAVENOUS | Status: DC
  Administered 2018-09-22 (×2): via INTRAVENOUS

## 2018-09-22 MED ORDER — DEXAMETHASONE SODIUM PHOSPHATE 10 MG/ML IJ SOLN
INTRAMUSCULAR | Status: DC | PRN
  Administered 2018-09-22: 10 mg via INTRAVENOUS

## 2018-09-22 MED ORDER — SUGAMMADEX SODIUM 200 MG/2ML IV SOLN
INTRAVENOUS | Status: DC | PRN
  Administered 2018-09-22: 200 mg via INTRAVENOUS

## 2018-09-22 MED ORDER — ONDANSETRON HCL 4 MG/2ML IJ SOLN
INTRAMUSCULAR | Status: DC | PRN
  Administered 2018-09-22: 4 mg via INTRAVENOUS

## 2018-09-22 MED ORDER — SODIUM CHLORIDE 0.9 % IV SOLN: INTRAVENOUS | Status: DC

## 2018-09-22 MED ORDER — VASOPRESSIN 20 UNIT/ML IV SOLN
INTRAVENOUS | Status: AC
  Filled 2018-09-22: qty 1

## 2018-09-22 MED ORDER — ROCURONIUM BROMIDE 10 MG/ML (PF) SYRINGE
PREFILLED_SYRINGE | INTRAVENOUS | Status: AC
  Filled 2018-09-22: qty 10

## 2018-09-22 MED ORDER — HEMOSTATIC AGENTS (NO CHARGE) OPTIME
TOPICAL | Status: DC | PRN
  Administered 2018-09-22: 2 via TOPICAL

## 2018-09-22 MED ORDER — CEFAZOLIN SODIUM-DEXTROSE 2-3 GM-%(50ML) IV SOLR
INTRAVENOUS | Status: DC | PRN
  Administered 2018-09-22: 2 g via INTRAVENOUS

## 2018-09-22 MED ORDER — LACTATED RINGERS IV SOLN: INTRAVENOUS | Status: DC | PRN

## 2018-09-22 MED ORDER — POTASSIUM CHLORIDE CRYS ER 20 MEQ PO TBCR: 20.0000 meq | EXTENDED_RELEASE_TABLET | Freq: Every day | ORAL | Status: DC | PRN

## 2018-09-22 MED ORDER — ONDANSETRON HCL 4 MG/2ML IJ SOLN: 4.0000 mg | Freq: Four times a day (QID) | INTRAMUSCULAR | Status: DC | PRN

## 2018-09-22 MED ORDER — ALBUMIN HUMAN 5 % IV SOLN
INTRAVENOUS | Status: DC | PRN
  Administered 2018-09-22 (×3): via INTRAVENOUS

## 2018-09-22 MED ORDER — SODIUM CHLORIDE 0.9 % IV SOLN
INTRAVENOUS | Status: DC | PRN
  Administered 2018-09-22: 500 mL

## 2018-09-22 MED ORDER — MAGNESIUM SULFATE 2 GM/50ML IV SOLN: 2.0000 g | Freq: Every day | INTRAVENOUS | Status: DC | PRN

## 2018-09-22 MED ORDER — PHENYLEPHRINE HCL (PRESSORS) 10 MG/ML IV SOLN
INTRAVENOUS | Status: AC
  Filled 2018-09-22: qty 1

## 2018-09-22 MED ORDER — HEPARIN SODIUM (PORCINE) 1000 UNIT/ML IJ SOLN
INTRAMUSCULAR | Status: DC | PRN
  Administered 2018-09-22: 5000 [IU] via INTRAVENOUS

## 2018-09-22 MED ORDER — PROPOFOL 10 MG/ML IV BOLUS
INTRAVENOUS | Status: AC
  Filled 2018-09-22: qty 20

## 2018-09-22 MED ORDER — LIDOCAINE 2% (20 MG/ML) 5 ML SYRINGE
INTRAMUSCULAR | Status: DC | PRN
  Administered 2018-09-22: 100 mg via INTRAVENOUS

## 2018-09-22 SURGICAL SUPPLY — 51 items
BANDAGE ESMARK 6X9 LF (GAUZE/BANDAGES/DRESSINGS) IMPLANT
BIOPATCH RED 1 DISK 7.0 (GAUZE/BANDAGES/DRESSINGS) ×2 IMPLANT
BNDG ESMARK 6X9 LF (GAUZE/BANDAGES/DRESSINGS)
CANISTER SUCT 3000ML PPV (MISCELLANEOUS) ×2 IMPLANT
CANNULA VESSEL 3MM 2 BLNT TIP (CANNULA) ×4 IMPLANT
CLIP VESOCCLUDE MED 24/CT (CLIP) ×2 IMPLANT
CLIP VESOCCLUDE SM WIDE 24/CT (CLIP) ×2 IMPLANT
COVER WAND RF STERILE (DRAPES) ×2 IMPLANT
CUFF TOURN SGL QUICK 18X4 (TOURNIQUET CUFF) IMPLANT
CUFF TOURN SGL QUICK 24 (TOURNIQUET CUFF)
CUFF TOURN SGL QUICK 34 (TOURNIQUET CUFF)
CUFF TOURN SGL QUICK 42 (TOURNIQUET CUFF) IMPLANT
CUFF TRNQT CYL 24X4X16.5-23 (TOURNIQUET CUFF) IMPLANT
CUFF TRNQT CYL 34X4.125X (TOURNIQUET CUFF) IMPLANT
DERMABOND ADVANCED (GAUZE/BANDAGES/DRESSINGS) ×1
DERMABOND ADVANCED .7 DNX12 (GAUZE/BANDAGES/DRESSINGS) ×1 IMPLANT
DRAIN CHANNEL 10M FLAT 3/4 FLT (DRAIN) ×2 IMPLANT
DRAIN SNY 10X20 3/4 PERF (WOUND CARE) IMPLANT
ELECT REM PT RETURN 9FT ADLT (ELECTROSURGICAL) ×2
ELECTRODE REM PT RTRN 9FT ADLT (ELECTROSURGICAL) ×1 IMPLANT
EVACUATOR SILICONE 100CC (DRAIN) ×2 IMPLANT
GAUZE SPONGE 4X4 12PLY STRL LF (GAUZE/BANDAGES/DRESSINGS) ×2 IMPLANT
GLOVE BIO SURGEON STRL SZ 6.5 (GLOVE) ×2 IMPLANT
GLOVE BIO SURGEON STRL SZ7.5 (GLOVE) ×2 IMPLANT
GLOVE BIOGEL PI IND STRL 6.5 (GLOVE) ×1 IMPLANT
GLOVE BIOGEL PI INDICATOR 6.5 (GLOVE) ×1
GOWN STRL REUS W/ TWL LRG LVL3 (GOWN DISPOSABLE) ×3 IMPLANT
GOWN STRL REUS W/TWL LRG LVL3 (GOWN DISPOSABLE) ×3
HEMOSTAT SPONGE AVITENE ULTRA (HEMOSTASIS) ×4 IMPLANT
KIT BASIN OR (CUSTOM PROCEDURE TRAY) ×2 IMPLANT
KIT TURNOVER KIT B (KITS) ×2 IMPLANT
NS IRRIG 1000ML POUR BTL (IV SOLUTION) ×4 IMPLANT
PACK PERIPHERAL VASCULAR (CUSTOM PROCEDURE TRAY) ×2 IMPLANT
PAD ARMBOARD 7.5X6 YLW CONV (MISCELLANEOUS) ×4 IMPLANT
SLEEVE SURGEON STRL (DRAPES) ×2 IMPLANT
SPONGE SURGIFOAM ABS GEL 100 (HEMOSTASIS) IMPLANT
STAPLER VISISTAT 35W (STAPLE) IMPLANT
SUT ETHILON 2 0 FS 18 (SUTURE) ×4 IMPLANT
SUT MNCRL AB 4-0 PS2 18 (SUTURE) ×2 IMPLANT
SUT PROLENE 5 0 C 1 24 (SUTURE) ×14 IMPLANT
SUT PROLENE 6 0 CC (SUTURE) ×4 IMPLANT
SUT SILK 2 0 PERMA HAND 18 BK (SUTURE) IMPLANT
SUT VIC AB 2-0 CTX 36 (SUTURE) ×4 IMPLANT
SUT VIC AB 3-0 SH 27 (SUTURE) ×2
SUT VIC AB 3-0 SH 27X BRD (SUTURE) ×2 IMPLANT
SUT VIC AB 4-0 PS2 18 (SUTURE) ×2 IMPLANT
TAPE CLOTH SURG 4X10 WHT LF (GAUZE/BANDAGES/DRESSINGS) ×2 IMPLANT
TOWEL GREEN STERILE (TOWEL DISPOSABLE) ×2 IMPLANT
TRAY FOLEY MTR SLVR 16FR STAT (SET/KITS/TRAYS/PACK) IMPLANT
UNDERPAD 30X30 (UNDERPADS AND DIAPERS) ×2 IMPLANT
WATER STERILE IRR 1000ML POUR (IV SOLUTION) ×2 IMPLANT

## 2018-09-22 NOTE — Transfer of Care (Signed)
Immediate Anesthesia Transfer of Care Note  Patient: Gregory Davila  Procedure(s) Performed: REPAIR FEMORAL PSEUDO ANEURYSM AND ARTERIOVENOUS FISTULA (Right Groin)  Patient Location: PACU  Anesthesia Type:General  Level of Consciousness: awake, alert  and oriented  Airway & Oxygen Therapy: Patient Spontanous Breathing and Patient connected to face mask oxygen  Post-op Assessment: Report given to RN and Post -op Vital signs reviewed and stable  Post vital signs: Reviewed and stable  Last Vitals:  Vitals Value Taken Time  BP 111/50 09/22/18 1628  Temp    Pulse 80 09/22/18 1631  Resp 17 09/22/18 1631  SpO2 100 % 09/22/18 1631  Vitals shown include unvalidated device data.  Last Pain:  Vitals:   09/22/18 0800  TempSrc:   PainSc: 3          Complications: No apparent anesthesia complications

## 2018-09-22 NOTE — Plan of Care (Signed)
  Problem: Education: Goal: Knowledge of General Education information will improve Description Including pain rating scale, medication(s)/side effects and non-pharmacologic comfort measures Outcome: Progressing   Problem: Health Behavior/Discharge Planning: Goal: Ability to manage health-related needs will improve Outcome: Progressing   

## 2018-09-22 NOTE — Progress Notes (Signed)
Pt returned from OR to room 4e04. Vitals obtained and stable. Right thigh and groin assessed. Right pedal pulse palpable +2. Pt denies needs at this time. Will continue to monitor.

## 2018-09-22 NOTE — Consult Note (Addendum)
ELECTROPHYSIOLOGY CONSULT NOTE    Patient ID: Gregory Davila MRN: [id number], DOB/AGE: [birth year] 78 y.o.  Admit date: 09/21/2018 Date of Consult: 09/22/2018  Primary Physician: Kela MillinBarrino, Alethea Y, MD Primary Cardiologist: Dr. Excell Seltzerooper Electrophysiologist: Dr. Elberta Fortisamnitz  Referring Provider: Dr Charna Busmanruby  Patient Profile: Gregory Davila is a 78 y.o. male with a history of PAF who is being seen today for the evaluation of pseudoaneurysm and AV fistula at the request of Dr. Charna Busmanruby.  HPI:  Gregory Davila is a 78 y.o. male is well known to Dr. Elberta Fortisamnitz from RFA of atrial fibrillation 09/20/2018. [facility] with a large area of induration. He woke 9/17 and noted his R thigh to be swollen and painfull. No numbness, tingling, or weakness further down the leg.   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 Appears to also have an AV fistula in the same area between the RCFA and RCFV.  He is feeling somewhat better this am. Leg is less stiff, but still uncomfortable and sore. Denies SOB, CP, or tachypalpitations. Remains in sinus by tele.   Past Medical History:  Diagnosis Date   Anginal pain (HCC)    CAD (coronary artery disease)    a, 2011: occluded LAD with left to right collaterals and moderate disease in the right coronary artery and circumflex, EF 50%. b. NSTEMI 07/2011 in setting of AF-RVR, MV abnl, cath w/ LAD 100%, RCA mod-severe dz, small vessel, med rx   Chronic renal insufficiency    followed by primary care physician   Gout    Hypertension    long-standing   Myocardial infarction [facility](HCC)    Paroxysmal atrial fibrillation (HCC)    a. On Coumadin therapy/amidarone. b. 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   Pulmonary embolism (HCC)      Surgical History:  Past Surgical History:  Procedure Laterality Date   APPENDECTOMY     ATRIAL FIBRILLATION ABLATION N/A 09/20/2018   Procedure: ATRIAL  FIBRILLATION ABLATION;  Surgeon: Regan Lemmingamnitz, Andersson Larrabee Martin, MD;  [facility]: [facility] INVASIVE CV LAB;  Service: Cardiovascular;  Laterality: N/A;   CARDIAC CATHETERIZATION  2011 & 2013   2013 L [place], LAD 100%, CFX 50%, RCA 80%, small vessel, med rx   CORONARY ARTERY BYPASS GRAFT N/A 02/14/2014   Procedure: CORONARY ARTERY BYPASS GRAFTING (CABG)X4 LIMA-LAD; SVG-RCA(ENDARDERECTOMY); SVG-OM; SVG-DIAG;  Surgeon: Kerin PernaPeter Van Trigt, MD;  Location: [facility] OR;  Service: Open Heart Surgery;  Laterality: N/A;   LEFT HEART CATHETERIZATION WITH CORONARY ANGIOGRAM N/A 02/08/2014   Procedure: LEFT HEART CATHETERIZATION WITH CORONARY ANGIOGRAM;  Surgeon: Micheline ChapmanMichael D Cooper, MD;  [facility];  Service: Cardiovascular;  Laterality: N/A;   NM MYOVIEW LTD  2013   Scar in the anteroseptal/periapical distribution with moderate peri-infarct ischemia, EF 43%   STERNAL INCISION RECLOSURE N/A 02/28/2014   Procedure: Irrigation and Debridement of Sternum with Sternal Rewiring;  Surgeon: Kerin PernaPeter Van Trigt, MD;  [facility];  Service: Thoracic;  Laterality: N/A;   TEE WITHOUT CARDIOVERSION N/A 02/14/2014   Procedure: TRANSESOPHAGEAL ECHOCARDIOGRAM (TEE);  Surgeon: Kerin PernaPeter Van Trigt, MD;  [facility];  Service: Open Heart Surgery;  Laterality: N/A;   TOTAL KNEE ARTHROPLASTY Bilateral      Medications Prior to Admission  Medication Sig Dispense Refill Last Dose   allopurinol (ZYLOPRIM) 300 MG tablet Take 0.5 tablets (150 mg total) by mouth daily. 30 tablet 1 09/21/2018 at Unknown time   amiodarone (  PACERONE) 200 MG tablet Take 1 tablet (200 mg total) by mouth daily. 90 tablet 3 09/21/2018 at Unknown time   apixaban (ELIQUIS) 5 MG TABS tablet Take 1 tablet (5 mg total) by mouth 2 (two) times daily. 60 tablet 5 09/21/2018 at Unknown time   aspirin EC 81 MG tablet Take 81 mg by mouth daily.   09/21/2018 at Unknown time   atorvastatin (LIPITOR) 40 MG tablet Take 1 tablet (40 mg total) by mouth daily at 6 PM. 30 tablet 1  09/20/2018 at Unknown time   calcium carbonate (TUMS - DOSED IN MG ELEMENTAL CALCIUM) 500 MG chewable tablet Chew 2 tablets by mouth daily as needed for indigestion or heartburn.      carvedilol (COREG) 12.5 MG tablet Take 1 tablet (12.5 mg total) by mouth 2 (two) times daily. 180 tablet 3 09/21/2018 at Unknown time   chlorthalidone (HYGROTON) 25 MG tablet Take 1 tablet (25 mg total) by mouth daily. 90 tablet 3 09/21/2018 at Unknown time   COLCRYS 0.6 MG tablet Take 0.6 mg by mouth 2 (two) times daily as needed (gout).    09/21/2018 at Unknown time   diltiazem (CARDIZEM) 30 MG tablet Take 15-30 mg by mouth 2 (two) times daily as needed (increased HR).    09/21/2018 at Unknown time   fluticasone (FLONASE) 50 MCG/ACT nasal spray Place 1 spray into both nostrils daily as needed for allergies or rhinitis.   09/20/2018 at Unknown time   ibuprofen (ADVIL) 200 MG tablet Take 400 mg by mouth every 6 (six) hours as needed for headache or moderate pain.      levothyroxine (SYNTHROID) 50 MCG tablet Take 50 mcg by mouth daily before breakfast.    09/21/2018 at Unknown time   loratadine (CLARITIN) 10 MG tablet Take 10 mg by mouth daily.   09/20/2018 at Unknown time   multivitamin [facility]) per tablet Take 1 tablet by mouth daily.     09/21/2018 at Unknown time   Naphazoline HCl (CLEAR EYES OP) Place 1 drop into both eyes daily as needed (irritation).   09/21/2018 at Unknown time   nitroGLYCERIN (NITROSTAT) 0.4 MG SL tablet Place 1 tablet (0.4 mg total) under the tongue every 5 (five) minutes x 3 doses as needed for chest pain. 25 tablet 2     Inpatient Medications:   allopurinol  150 mg Oral Daily   amiodarone  200 mg Oral Daily   atorvastatin  40 mg Oral q1800   carvedilol  12.5 mg Oral BID   levothyroxine  50 mcg Oral QAC breakfast    Allergies:  Allergies  Allergen Reactions   Azithromycin Nausea And Vomiting    "throws for a loop"   Oxycodone Other (See Comments)    nightmares     Social History   Socioeconomic History   Marital status: Married    Spouse name: Not on file   Number of children: Not on file   Years of education: Not on file   Highest education level: Not on file  Occupational History   Occupation: Retired  Ecologist strain: Not on file   Food insecurity    Worry: Not on file    Inability: Not on file   Transportation needs    Medical: Not on file    Non-medical: Not on file  Tobacco Use   Smoking status: Former Smoker    Types: Cigarettes    Quit date: 01/04/1966    Years since quitting: 52.7  Smokeless tobacco: Former Neurosurgeon    Types: Chew    Quit date: 01/05/2007   Tobacco comment: quit chewing tobacco about 4 yrs ago  Substance and Sexual Activity   Alcohol use: Yes    Comment: "seldomly drink beer" 1 every 2-3 weeks   Drug use: No   Sexual activity: Yes  Lifestyle   Physical activity    Days per week: Not on file    Minutes per session: Not on file   Stress: Not on file  Relationships   Social connections    Talks on phone: Not on file    Gets together: Not on file    Attends religious service: Not on file    Active member of club or organization: Not on file    Attends meetings of clubs or organizations: Not on file    Relationship status: Not on file   Intimate partner violence    Fear of current or ex partner: Not on file    Emotionally abused: Not on file    Physically abused: Not on file    Forced sexual activity: Not on file  Other Topics Concern   Not on file  Social History Narrative   Not on file     Family History  Problem Relation Age of Onset   Heart attack Mother    Cancer Mother    Prostate cancer Father    Coronary artery disease Brother        MI     Review of Systems: All other systems reviewed and are otherwise negative except as noted above.  Physical Exam: Vitals:   09/21/18 1930 09/21/18 2038 09/21/18 2138 09/22/18 0336  BP: 106/64  121/76 110/70 97/69  Pulse: 80 82 98 79  Resp: 13 14 15 16   Temp:   99 F (37.2 C) 98.5 F (36.9 C)  TempSrc:   Oral Oral  SpO2:   98% 92%  Weight:   103.5 kg   Height:   5\' 11"  (1.803 m)     GEN- The patient is well appearing, alert and oriented x 3 today.   HEENT: normocephalic, atraumatic; sclera clear, conjunctiva pink; hearing intact; oropharynx clear; neck supple Lungs- Clear to ausculation bilaterally, normal work of breathing.  No wheezes, rales, rhonchi Heart- Regular rate and rhythm, no murmurs, rubs or gallops GI- soft, non-tender, non-distended, bowel sounds present Extremities- R groin with large hematoma.  MS- no significant deformity or atrophy Skin- warm and dry, no rash or lesion Psych- euthymic mood, full affect Neuro- strength and sensation are intact  Labs:   Lab Results  Component Value Date   WBC 13.9 (H) 09/22/2018   HGB 12.4 (L) 09/22/2018   HCT 38.3 (L) 09/22/2018   MCV 95.8 09/22/2018   PLT 185 09/22/2018    Recent Labs  Lab 09/21/18 1421  NA 136  K 4.0  CL 106  CO2 21*  BUN 36*  CREATININE 1.94*  CALCIUM 8.4*  GLUCOSE 141*      Radiology/Studies: [place] Lower Ext Art Right Ltd  Result Date: 09/21/2018 CLINICAL DATA:  Possible pseudo aneurysm after catheterization. EXAM: Right LOWER EXTREMITY ARTERIAL DUPLEX SCAN TECHNIQUE: Gray-scale sonography as well as color Doppler and duplex ultrasound was performed to evaluate the lower extremity arteries including the common, superficial and profunda femoral arteries, popliteal artery and calf arteries. COMPARISON:  None. FINDINGS: Sonographic and Doppler evaluation of the right groin and upper thigh region was performed. This demonstrates large complex fluid collection in the right  groin region with layering blood or heme products consistent with hematoma. Some pulsatile movement of the blood is noted suggesting pseudoaneurysm. Doppler also demonstrates arterial flow within the pseudoaneurysm that a  synchronous with arterial flow in the adjacent right common femoral artery. The pseudoaneurysm and hematoma are large. There does appear to be a neck extending from the right common femoral artery. The right common femoral artery is patent. There is also noted simultaneous flow in the adjacent right common femoral vein suggesting arteriovenous fistula. 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 Electronically Signed   By: Marijo Conception M.D.   On: 09/21/2018 14:25   Ct Cardiac Morph/pulm Vein W/cm&w/o Ca Score  Addendum Date: 09/14/2018   ADDENDUM REPORT: 09/14/2018 14:12 EXAM: OVER-READ INTERPRETATION  CT CHEST The following report is an over-read performed by radiologist Dr. Rebekah Chesterfield [facility] on 09/14/2018. This over-read does not include interpretation of cardiac or coronary anatomy or pathology. The calcium score and cardiac CTA interpretation by the cardiologist is attached. COMPARISON:  Chest CT 02/28/2014. FINDINGS: Aortic atherosclerosis with ectasia of ascending thoracic aorta (4.2 cm in diameter). Status post median sternotomy for CABG. Within the visualized portions of the thorax there are no suspicious appearing pulmonary nodules or masses, there is no acute consolidative airspace disease, no pleural effusions, no pneumothorax and no lymphadenopathy. Visualized portions of the upper abdomen are unremarkable. There are no aggressive appearing lytic or blastic lesions noted in the visualized portions of the skeleton. IMPRESSION: 1.  Aortic Atherosclerosis (ICD10-I70.0). 2. 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 Recommend annual imaging followup by CTA or MRA. This recommendation follows 2010 ACCF/AHA/AATS/ACR/ASA/SCA/SCAI/SIR/STS/SVM Guidelines  for the Diagnosis and Management of Patients with Thoracic Aortic Disease. Circulation. 2010; 121: M578-I696. Aortic aneurysm NOS (ICD10-I71.9) Electronically Signed   By: Vinnie Langton M.D.   On: 09/14/2018 14:12   Result Date: 09/14/2018 CLINICAL DATA:  78 year old male with atrial fibrillation scheduled for an ablation. EXAM: Cardiac CT/CTA TECHNIQUE: The patient was scanned on a Siemens Somatom scanner. FINDINGS: A 120 kV prospective scan was triggered in the descending thoracic aorta at 111 HU's. Gantry rotation speed was 280 msecs and collimation was .9 mm. No beta blockade and no NTG was given. The 3D data set was reconstructed in 5% intervals of the 60-80 % of the R-R cycle. Diastolic phases were analyzed on a dedicated work station using MPR, MIP and VRT modes. The patient received 80 cc of contrast. There is normal pulmonary vein drainage into the left atrium (3 on the right and 2 on the left) with ostial measurements as follows: RUPV: 24.6 x 16.1 mm RMPV: 12.3 x 7.9 mm RLPV: 26.9 x 21.1 mm LUPV: 24.0 x 12.8 mm LLPV: 20.6 x 13.5 mm The left atrial appendage is medium size with no evidence for a thrombus. The esophagus runs to the left from the left atrial midline and is in the proximity to the ostium of the LLPV. Aorta: Normal caliber. No dissection. Mild diffuse atherosclerotic disease and calcifications. Aortic Valve: Trileaflet. Mild leaflet thickening and calcifications. IMPRESSION: 1. There is normal pulmonary vein drainage into the left atrium (3 on the right and 2 on the left). 2. The left atrial appendage is medium size with no evidence for a thrombus. 3. The esophagus runs to the left  from the left atrial midline and is in the proximity to the ostium of the LLPV Electronically Signed: By: Tobias AlexanderKatarina  Nelson On: 09/14/2018 12:42    EKG: 9/16 shows NSR 73 bpm (personally reviewed)  TELEMETRY: NSR 80-90s (personally reviewed)  Assessment/Plan: 1.  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   2. Persistent atrial fibrillation Maintaining NSR on coreg and amiodarone.  Pt on Eliquis with CHA2DS2VASC of 4. Emillee Talsma have to discuss resumption with vascular based on management of psuedoaneurysm.  3. HTN Follow.   For questions or updates, please contact [facility] Please consult [email or web address] for contact info under Cardiology/STEMI.  Signed, Graciella FreerMichael Andrew Tillery, PA-C  09/22/2018 7:41 AM   I have seen and examined this patient with Otilio SaberAndy Tillery.  Agree with above, note added to reflect my findings.  On exam, RRR, no murmurs, lungs clear. Patient with af ablation 2 days prior. Had swelling of right leg the day after. Ultrasound demonstrated hematoma and pseudoaneurysm with possible AV fistula. Right leg is tight at the catheter site with swelling and hematoma. Tamey Wanek consult vascular surgery for their input on further therapy.    Ermina Oberman M. Amarion Portell MD 09/22/2018 7:59 AM

## 2018-09-22 NOTE — Consult Note (Signed)
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 male, status post ablation for atrial fibrillation yesterday.  Patient returned to the emergency room with a hematoma in the right groin and pain.  Ultrasound last evening showed right common femoral artery pseudoaneurysm with AV fistula communication between the common femoral artery and vein.  Patient still has some pain in the right groin and thigh but no expansion of the hematoma in his right leg.  Pain is fairly well-controlled with ice and pain medication.  He has no numbness or tingling in his foot.  Patient is on Eliquis for his atrial fibrillation.  His last dose was yesterday evening.  Other medical problems include coronary artery disease, renal insufficiency, hypertension all of which have been stable.  Past Medical History:  Diagnosis Date  . Anginal pain (Gregory)   . CAD (coronary artery disease)    a, 2011: occluded LAD with left to right collaterals and moderate disease in the right coronary artery and circumflex, EF 50%. b. NSTEMI 07/2011 in setting of AF-RVR, MV abnl, cath w/ LAD 100%, RCA mod-severe dz, small vessel, med rx  . Chronic renal insufficiency    followed by primary care physician  . Gout   . Hypertension    long-standing  . Myocardial infarction (Gregory Davila)   . Paroxysmal atrial fibrillation (Gregory Davila)    a. On Coumadin therapy/amidarone. b. 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  . Pulmonary embolism Gregory Davila)    Past Surgical History:  Procedure Laterality Date  . APPENDECTOMY    . ATRIAL FIBRILLATION ABLATION N/A 09/20/2018   Procedure: ATRIAL FIBRILLATION ABLATION;  Surgeon: Constance Haw, MD;  Location: [facility];  Service: Cardiovascular;  Laterality: N/A;  . CARDIAC CATHETERIZATION  2011 & 2013   2013 L  [place], LAD 100%, CFX 50%, RCA 80%, small vessel, med rx  . CORONARY ARTERY BYPASS GRAFT N/A 02/14/2014   Procedure: CORONARY ARTERY BYPASS GRAFTING (CABG)X4 LIMA-LAD; SVG-RCA(ENDARDERECTOMY); SVG-OM; SVG-DIAG;  Surgeon: Ivin Poot, MD;  Location: Gregory [facility];  Service: Open Heart Surgery;  Laterality: N/A;  . LEFT HEART CATHETERIZATION WITH CORONARY ANGIOGRAM N/A 02/08/2014   Procedure: LEFT HEART CATHETERIZATION WITH CORONARY ANGIOGRAM;  Surgeon: Blane Ohara, MD;  Location: [facility];  Service: Cardiovascular;  Laterality: N/A;  . NM MYOVIEW LTD  2013   Scar in the anteroseptal/periapical distribution with moderate peri-infarct ischemia, EF 43%  . STERNAL INCISION RECLOSURE N/A 02/28/2014   Procedure: Irrigation and Debridement of Sternum with Sternal Rewiring;  Surgeon: Ivin Poot, MD;  Location: Gregory [facility];  Service: Thoracic;  Laterality: N/A;  . TEE WITHOUT CARDIOVERSION N/A 02/14/2014   Procedure: TRANSESOPHAGEAL ECHOCARDIOGRAM (TEE);  Surgeon: Ivin Poot, MD;  Location: [facility];  Service: Open Heart Surgery;  Laterality: N/A;  . TOTAL KNEE ARTHROPLASTY Bilateral     Family History  Problem Relation Age of Onset  . Heart attack Mother   . Cancer Mother   . Prostate cancer Father   . Coronary artery disease Brother        MI    SOCIAL HISTORY: Social History   Socioeconomic History  . Marital status: Married    Spouse name: Not on file  . Number of children: Not on file  . Years of education: Not on file  .  Highest education level: Not on file  Occupational History  . Occupation: Retired  Engineer, productionocial Needs  . Financial resource strain: Not on file  . Food insecurity    Worry: Not on file    Inability: Not on file  . Transportation needs    Medical: Not on file    Non-medical: Not on file  Tobacco Use  . Smoking status: Former Smoker    Types: Cigarettes    Quit date: 01/04/1966    Years since quitting: 52.7  . Smokeless tobacco: Former NeurosurgeonUser    Types: Chew    Quit  date: 01/05/2007  . Tobacco comment: quit chewing tobacco about 4 yrs ago  Substance and Sexual Activity  . Alcohol use: Yes    Comment: "seldomly drink beer" 1 every 2-3 weeks  . Drug use: No  . Sexual activity: Yes  Lifestyle  . Physical activity    Days per week: Not on file    Minutes per session: Not on file  . Stress: Not on file  Relationships  . Social Musicianconnections    Talks on phone: Not on file    Gets together: Not on file    Attends religious service: Not on file    Active member of club or organization: Not on file    Attends meetings of clubs or organizations: Not on file    Relationship status: Not on file  . Intimate partner violence    Fear of current or ex partner: Not on file    Emotionally abused: Not on file    Physically abused: Not on file    Forced sexual activity: Not on file  Other Topics Concern  . Not on file  Social History Narrative  . Not on file    Allergies  Allergen Reactions  . Azithromycin Nausea And Vomiting    "throws for a loop"  . Oxycodone Other (See Comments)    nightmares    Current Facility-Administered Medications  Medication Dose Route Frequency Provider Last Rate Last Dose  . acetaminophen (TYLENOL) tablet 650 mg  650 mg Oral Q6H PRN Sande Rives'Neal, [place] Thomas, MD   650 mg at 09/22/18 [phone]  . allopurinol (ZYLOPRIM) tablet 150 mg  150 mg Oral Daily Laurell Josephsruby, Lauren K, MD      . amiodarone (PACERONE) tablet 200 mg  200 mg Oral Daily Laurell Josephsruby, Lauren K, MD      . atorvastatin (LIPITOR) tablet 40 mg  40 mg Oral q1800 Laurell Josephsruby, Lauren K, MD      . carvedilol (COREG) tablet 12.5 mg  12.5 mg Oral BID Laurell Josephsruby, Lauren K, MD   12.5 mg at 09/21/18 2235  . levothyroxine (SYNTHROID) tablet 50 mcg  50 mcg Oral QAC breakfast Laurell Josephsruby, Lauren K, MD   50 mcg at 09/22/18 0641    ROS:   General:  No weight loss, Fever, chills  Cardiac: No recent episodes of chest pain/pressure, no shortness of breath at rest.  No shortness of breath with exertion.  Denies  history of atrial fibrillation or irregular heartbeat  Vascular: No history of rest pain in feet.  No history of claudication.  No history of non-healing ulcer, No history of DVT   Pulmonary: No home oxygen, no productive cough, no hemoptysis,  No asthma or wheezing   Physical Examination  Vitals:   09/21/18 1930 09/21/18 2038 09/21/18 2138 09/22/18 0336  BP: 106/64 121/76 110/70 97/69  Pulse: 80 82 98 79  Resp: 13 14 15 16   Temp:  99 F (37.2 C) 98.5 F (36.9 C)  TempSrc:   Oral Oral  SpO2:   98% 92%  Weight:   103.5 kg   Height:   5\' 11"  (1.803 m)     Body mass index is 31.82 kg/m.  General:  Alert and oriented, no acute distress HEENT: Normal Neck: No JVD Pulmonary: Clear to auscultation bilaterally Cardiac: Regular Rate and Rhythm Abdomen: Soft, non-tender, non-distended Skin: No rash, ecchymosis extending to the right medial thigh with tenderness and induration extending from the groin into the high medial thigh Extremity Pulses:  2+ radial, brachial, femoral, dorsalis pedis, posterior tibial pulses right leg Musculoskeletal: No deformity mild right leg edema  Neurologic: Upper and lower extremity motor 5/5 and symmetric  DATA:  Ultrasound right groin reviewed right common femoral artery pseudoaneurysm with AV fistula communication  Operative note reviewed no mention of arterial cannulation but obviously patient has common femoral artery pseudoaneurysm.  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  Due to the fact patient has an AV fistula communication I would not consider doing a thrombin injection of this.  We will plan on fixing this in the operating room later today.  Risk benefits possible complications and procedure details discussed with the patient today including but not limited to bleeding infection possible transfusion possible wound healing problems.  He understands and agrees to proceed.   PLAN: Keep patient  n.p.o.  Continue to hold Eliquis  Plan for repair of artery later today.   Fabienne Bruns, MD [facility] Office: [phone] Pager: [phone]

## 2018-09-22 NOTE — Anesthesia Procedure Notes (Signed)
Procedure Name: Intubation Date/Time: 09/22/2018 2:01 PM Performed by: Candis Shine, CRNA Pre-anesthesia Checklist: Patient identified, Emergency Drugs available, Suction available and Patient being monitored Patient Re-evaluated:Patient Re-evaluated prior to induction Oxygen Delivery Method: Circle System Utilized Preoxygenation: Pre-oxygenation with 100% oxygen Induction Type: IV induction Ventilation: Mask ventilation without difficulty and Oral airway inserted - appropriate to patient size Laryngoscope Size: Mac and 4 Grade View: Grade I Tube type: Oral Tube size: 7.5 mm Number of attempts: 1 Airway Equipment and Method: Stylet and Oral airway Placement Confirmation: ETT inserted through vocal cords under direct vision,  positive ETCO2 and breath sounds checked- equal and bilateral Secured at: 22 cm Tube secured with: Tape Dental Injury: Teeth and Oropharynx as per pre-operative assessment

## 2018-09-22 NOTE — Anesthesia Preprocedure Evaluation (Signed)
Anesthesia Evaluation  Patient identified by MRN, date of birth, ID band Patient awake    Reviewed: Allergy & Precautions, NPO status , Patient's Chart, lab work & pertinent test results, reviewed documented beta blocker date and time   History of Anesthesia Complications (+) PONV  Airway Mallampati: II  TM Distance: >3 FB Neck ROM: Full    Dental  (+) Poor Dentition, Dental Advisory Given   Pulmonary former smoker,    breath sounds clear to auscultation       Cardiovascular hypertension, Pt. on medications and Pt. on home beta blockers + angina + CAD, + Past MI and +CHF  + Valvular Problems/Murmurs AS  Rhythm:Irregular Rate:Normal  Echo 07/2018  1. The left ventricle has normal systolic function, with an ejection fraction of 55-60%. The cavity size was normal. There is mild concentric left ventricular hypertrophy. Left ventricular diastolic Doppler parameters are consistent with impaired relaxation. No evidence of left ventricular regional wall motion abnormalities.  2. The right ventricle has mildly reduced systolic function. The cavity was normal. There is mildly increased right ventricular wall thickness.  3. The mitral valve is degenerative. Mild thickening of the mitral valve leaflet. There is mild mitral annular calcification present.  4. The tricuspid valve is grossly normal.  5. The aortic valve is tricuspid. Mild thickening of the aortic valve. Moderate calcification of the aortic valve. Aortic valve regurgitation is moderate by color flow Doppler. Mild stenosis of the aortic valve.  6. Severe aortic annular calcification.  7. There is mild dilatation of the aortic root measuring 40 mm.   Neuro/Psych    GI/Hepatic GERD  ,  Endo/Other  negative endocrine ROS  Renal/GU Renal disease     Musculoskeletal   Abdominal   Peds  Hematology   Anesthesia Other Findings   Reproductive/Obstetrics                             Anesthesia Physical  Anesthesia Plan  ASA: III  Anesthesia Plan: General   Post-op Pain Management:    Induction: Intravenous  PONV Risk Score and Plan: 4 or greater and Ondansetron, Dexamethasone and Treatment may vary due to age or medical condition  Airway Management Planned: Oral ETT  Additional Equipment: None  Intra-op Plan:   Post-operative Plan: Possible Post-op intubation/ventilation  Informed Consent: I have reviewed the patients History and Physical, chart, labs and discussed the procedure including the risks, benefits and alternatives for the proposed anesthesia with the patient or authorized representative who has indicated his/her understanding and acceptance.     Dental advisory given  Plan Discussed with: CRNA  Anesthesia Plan Comments:        Anesthesia Quick Evaluation

## 2018-09-22 NOTE — Op Note (Signed)
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  He was noted to have increasing pain in his right groin and found to have a pseudoaneurysm and AV fistula on ultrasound.  Operative findings: #1 3 holes repaired right superficial femoral artery approximately 4 cm below the femoral bifurcation  2.  1 hole repaired right superficial femoral vein  3.  AV fistula communication on the anterior portion of the artery and vein disconnected  Operative details: After team informed consent, the patient in the operating.  The patient was placed in supine position operating table.  After induction general anesthesia endotracheal ovation patient's right groin and anterior thigh were prepped and draped in usual sterile fashion.  Next a longitudinal incision was made in the right groin carried down through the subcutaneous tissues down level right common femoral artery.  Inguinal ligament was identified.  The common femoral artery was dissected free circumferentially just below the level inguinal ligament.  There was no area of bleeding in this location.  A vessel loop was placed around the common femoral artery.  Then proceeded to dissected further down the common femoral artery and there were no bleeding sites within the common femoral artery.  There was a pro-glide suture type device on the anterior wall and this was taken down.  This area was repaired with a 5-0 Prolene suture.  The profunda femoris was dissected free circumferentially and Vesseloops placed around it.  The proximal superficial femoral artery was dissected free circumferentially and a vessel loop placed around it.  The patient was given 5000 units of intravenous heparin.  Further dissection  down the anterior portion of the superficial femoral artery revealed significant bleeding from the anterior wall and posterior wall of the SFA.  This was about 4 cm below the femoral bifurcation.  There is also a fistula communication between the artery and the vein this was transected and oversewn on either side with a 5-0 Prolene figure-of-eight stitch.  I was able to dissect down below the area of injury to the SFA and place a Henley clamp on the artery.  This achieved pretty reasonable hemostasis with Vesseloops controlled proximally.  Then proceeded to repair 3 discrete holes in the SFA (2 on the anterior wall one on the posterior wall) with a two 5 0 figure-of-eight's and 160 figure-of-eight suture.  There was also a hole in the superficial femoral vein adjacent to this and this was repaired with 150 figure-of-eight suture.  This point hemostasis obtained in the soft tissues using cautery.  A #10 flat Jackson-Pratt drain was then brought out through a separate stab incision through the hematoma cavity on the anterior aspect of the right thigh.  This was sutured in place with a nylon stitch.  Avitene and direct pressure were used to control some oozing in the soft tissues.  The groin was then closed in multiple layers a running 2-0 and 3-0 Vicryl suture and a 4-0 Vicryl subcuticular stitch in the skin.  Dermabond was applied.  The patient tolerated procedure well and there were no complications.  The instrument sponge and needle count was correct the end of the case.  The patient was taken to recovery in stable condition.  [name], MD [facility] Office: [phone] Pager: [phone]

## 2018-09-22 NOTE — Progress Notes (Signed)
Consulted vascular surgery for pseudoaneurysm with AV fistula. Dr. Oneida Alar to see the patient.

## 2018-09-23 DIAGNOSIS — I4819 Other persistent atrial fibrillation: Principal | ICD-10-CM

## 2018-09-23 LAB — BASIC METABOLIC PANEL
Anion gap: 13 (ref 5–15)
Anion gap: 9 (ref 5–15)
BUN: 36 mg/dL — ABNORMAL HIGH (ref 8–23)
BUN: 36 mg/dL — ABNORMAL HIGH (ref 8–23)
CO2: 17 mmol/L — ABNORMAL LOW (ref 22–32)
CO2: 24 mmol/L (ref 22–32)
Calcium: 7.7 mg/dL — ABNORMAL LOW (ref 8.9–10.3)
Calcium: 8.4 mg/dL — ABNORMAL LOW (ref 8.9–10.3)
Chloride: 104 mmol/L (ref 98–111)
Chloride: 106 mmol/L (ref 98–111)
Creatinine, Ser: 2.01 mg/dL — ABNORMAL HIGH (ref 0.61–1.24)
Creatinine, Ser: 2.09 mg/dL — ABNORMAL HIGH (ref 0.61–1.24)
GFR calc Af Amer: 36 mL/min — ABNORMAL LOW (ref >60)
GFR calc Af Amer: 34 mL/min — ABNORMAL LOW (ref >60)
GFR calc non Af Amer: 31 mL/min — ABNORMAL LOW (ref >60)
GFR calc non Af Amer: 30 mL/min — ABNORMAL LOW (ref >60)
Glucose, Bld: 125 mg/dL — ABNORMAL HIGH (ref 70–99)
Glucose, Bld: 165 mg/dL — ABNORMAL HIGH (ref 70–99)
Potassium: 6.2 mmol/L — ABNORMAL HIGH (ref 3.5–5.1)
Potassium: 4.2 mmol/L (ref 3.5–5.1)
Sodium: 134 mmol/L — ABNORMAL LOW (ref 135–145)
Sodium: 139 mmol/L (ref 135–145)

## 2018-09-23 LAB — CBC
HCT: 25.2 % — ABNORMAL LOW (ref 39.0–52.0)
Hemoglobin: 8.3 g/dL — ABNORMAL LOW (ref 13.0–17.0)
MCH: 31.9 pg (ref 26.0–34.0)
MCHC: 32.9 g/dL (ref 30.0–36.0)
MCV: 96.9 fL (ref 80.0–100.0)
Platelets: 138 10*3/uL — ABNORMAL LOW (ref 150–400)
RBC: 2.6 MIL/uL — ABNORMAL LOW (ref 4.22–5.81)
RDW: 13.8 % (ref 11.5–15.5)
WBC: 14.1 10*3/uL — ABNORMAL HIGH (ref 4.0–10.5)
nRBC: 0 % (ref 0.0–0.2)

## 2018-09-23 NOTE — Anesthesia Postprocedure Evaluation (Signed)
Anesthesia Post Note  Patient: Gregory Davila  Procedure(s) Performed: REPAIR FEMORAL PSEUDO ANEURYSM AND ARTERIOVENOUS FISTULA (Right Groin)     Patient location during evaluation: PACU Anesthesia Type: General Level of consciousness: sedated and patient cooperative Pain management: pain level controlled Vital Signs Assessment: post-procedure vital signs reviewed and stable Respiratory status: spontaneous breathing Cardiovascular status: stable Anesthetic complications: no    Last Vitals:  Vitals:   09/23/18 0916 09/23/18 1332  BP: 98/64 98/68  Pulse: 88 76  Resp: 16 17  Temp: 36.4 C 36.7 C  SpO2: 91% 95%    Last Pain:  Vitals:   09/23/18 1332  TempSrc: Oral  PainSc:                  [name]

## 2018-09-23 NOTE — Evaluation (Signed)
Physical Therapy Evaluation Patient Details Name: Gregory Davila MRN: [id number] DOB: 08-23-1940 Today's Date: 09/23/2018   History of Present Illness  78 y.o. 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 Hx of CAD, MI, pAF, embolism, and HTN.  Clinical Impression  Patient is s/p above surgery presenting with functional limitations due to the deficits listed below (see PT Problem List). Supervision to min assist, stead with RW for support. Lives with wife who will be home with him at d/c. Declines need for HHPT which should be fine based on his performance today but may consider OPPT for strengthening given the fact that he was active and on his feet a lot at work PTA. Will continue to progress during admission. Patient will benefit from skilled PT to increase their independence and safety with mobility to allow discharge to the venue listed below.       Follow Up Recommendations Outpatient PT(to redevelop strength as needed, postop)    Equipment Recommendations  None recommended by PT    Recommendations for Other Services       Precautions / Restrictions Precautions Precautions: None Restrictions Weight Bearing Restrictions: No      Mobility  Bed Mobility Overal bed mobility: Needs Assistance Bed Mobility: Supine to Sit     Supine to sit: Min assist     General bed mobility comments: Initially needed min assist for RLE OOB due to soreness but performed without assist a bit later. Slower and guarded but safe and appropriate.  Transfers Overall transfer level: Needs assistance Equipment used: Rolling walker (2 wheeled) Transfers: Sit to/from Stand Sit to Stand: Supervision         General transfer comment: Supervision for safety. Slow to rise, VC for hand placement.  Ambulation/Gait Ambulation/Gait assistance: Supervision Gait Distance (Feet): 85 Feet Assistive device: Rolling walker (2 wheeled) Gait Pattern/deviations: Step-through  pattern;Decreased step length - left;Decreased stance time - right;Decreased stride length;Antalgic Gait velocity: Decreased   General Gait Details: Supervision for safety. VC for safe RW use. No buckling or overt loss of balance. Light support utilized from [employer].  Stairs            Wheelchair Mobility    Modified Rankin (Stroke Patients Only)       Balance Overall balance assessment: Needs assistance Sitting-balance support: No upper extremity supported;Feet supported Sitting balance-Leahy Scale: Normal     Standing balance support: No upper extremity supported Standing balance-Leahy Scale: Good                               Pertinent Vitals/Pain Pain Assessment: No/denies pain    Home Living Family/patient expects to be discharged to:: Private residence Living Arrangements: Spouse/significant other Available Help at Discharge: Family;Available 24 hours/day Type of Home: House Home Access: Stairs to enter Entrance Stairs-Rails: None Entrance Stairs-Number of Steps: 1-2 Home Layout: One level Home Equipment: Walker - 4 wheels;Cane - single point;Shower seat - built in      Prior Function Level of Independence: Independent               Hand Dominance   Dominant Hand: Right    Extremity/Trunk Assessment   Upper Extremity Assessment Upper Extremity Assessment: Defer to OT evaluation    Lower Extremity Assessment Lower Extremity Assessment: RLE deficits/detail RLE Deficits / Details: General weakness, slight guarding with hip movement.       Communication   Communication: No difficulties  Cognition Arousal/Alertness: Awake/alert Behavior During Therapy: WFL for tasks assessed/performed Overall Cognitive Status: Within Functional Limits for tasks assessed                                        General Comments General comments (skin integrity, edema, etc.): SpO2 93% and greater on room air during therapy session     Exercises     Assessment/Plan    PT Assessment Patient needs continued PT services  PT Problem List Decreased strength;Decreased range of motion;Decreased balance;Decreased mobility;Decreased knowledge of use of DME;Decreased activity tolerance;Pain       PT Treatment Interventions DME instruction;Gait training;Stair training;Functional mobility training;Therapeutic activities;Therapeutic exercise;Balance training;Neuromuscular re-education    PT Goals (Current goals can be found in the Care Plan section)  Acute Rehab PT Goals Patient Stated Goal: go home PT Goal Formulation: With patient Time For Goal Achievement: 10/07/18 Potential to Achieve Goals: Good    Frequency Min 3X/week   Barriers to discharge        Co-evaluation               AM-PAC PT "6 Clicks" Mobility  Outcome Measure Help needed turning from your back to your side while in a flat bed without using bedrails?: None Help needed moving from lying on your back to sitting on the side of a flat bed without using bedrails?: A Little Help needed moving to and from a bed to a chair (including a wheelchair)?: None Help needed standing up from a chair using your arms (e.g., wheelchair or bedside chair)?: None Help needed to walk in hospital room?: A Little Help needed climbing 3-5 steps with a railing? : A Lot 6 Click Score: 20    End of Session Equipment Utilized During Treatment: Gait belt Activity Tolerance: Patient tolerated treatment well Patient left: in chair;with call bell/phone within reach   PT Visit Diagnosis: Other abnormalities of gait and mobility (R26.89);Difficulty in walking, not elsewhere classified (R26.2);Pain Pain - Right/Left: Right Pain - part of body: (Groin)    Time: 1610-9604 PT Time Calculation (min) (ACUTE ONLY): 24 min   Charges:   PT Evaluation $PT Eval Moderate Complexity: 1 Mod PT Treatments $Gait Training: 8-22 mins        [employer], PT   Ellouise Newer 09/23/2018, 11:49 AM

## 2018-09-23 NOTE — Progress Notes (Addendum)
[facility]  Subjective  - no new complaints.   Objective 107/61 79 98.5 F (36.9 C) (Oral) 15 90%  Intake/Output Summary (Last 24 hours) at 09/23/2018 0747 Last data filed at 09/23/2018 0604 Gross per 24 hour  Intake 3050 ml  Output 2107 ml  Net 943 ml    Right groin softer per patient JP drain 107 cc total with last shift 70 cc output. 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 PT/OT ordered. Will discuss Eliquis restart with Dr. Oneida Alar HGB 8.3 asymptomatic blood loss anemia.  Gregory Davila 09/23/2018 7:47 AM --  Discussed with Dr Lovena Le he is ok with holding Eliquis Keep drain until less than 30/24 hr Out of bed ambulate  [name], MD [facility] [place]: [phone] Pager: [phone]   Laboratory Lab Results: Recent Labs    09/22/18 0324 09/22/18 1528 09/23/18 0318  WBC 13.9*  --  14.1*  HGB 12.4* 9.9* 8.3*  HCT 38.3* 29.0* 25.2*  PLT 185  --  138*   BMET Recent Labs    09/21/18 1421 09/22/18 1528 09/23/18 0318  NA 136 140 134*  K 4.0 4.5 6.2*  CL 106  --  104  CO2 21*  --  17*  GLUCOSE 141*  --  125*  BUN 36*  --  36*  CREATININE 1.94*  --  2.01*  CALCIUM 8.4*  --  7.7*    COAG Lab Results  Component Value Date   INR 1.4 (H) 09/22/2018   INR 3.0 05/23/2018   INR 2.8 (H) 05/17/2018   No results found for: PTT

## 2018-09-23 NOTE — Progress Notes (Signed)
Progress Note  Patient Name: Gregory Davila Date of Encounter: 09/23/2018  Primary Cardiologist: Sherren Mocha, MD   Subjective   "My leg feels better."  Inpatient Medications    Scheduled Meds:  allopurinol  150 mg Oral Daily   amiodarone  200 mg Oral Daily   atorvastatin  40 mg Oral q1800   carvedilol  12.5 mg Oral BID   docusate sodium  100 mg Oral Daily   levothyroxine  50 mcg Oral QAC breakfast   pantoprazole  40 mg Oral Daily   Continuous Infusions:  sodium chloride     sodium chloride     magnesium sulfate bolus IVPB     PRN Meds: sodium chloride, acetaminophen, alum & mag hydroxide-simeth, guaiFENesin-dextromethorphan, hydrALAZINE, labetalol, magnesium sulfate bolus IVPB, metoprolol tartrate, morphine injection, ondansetron, phenol, potassium chloride, traMADol   Vital Signs    Vitals:   09/22/18 1813 09/22/18 2032 09/23/18 0334 09/23/18 0916  BP: (!) 104/53 100/61 107/61 98/64  Pulse: 83 88 79 88  Resp: 18 17 15 16   Temp: 98.5 F (36.9 C) 98.6 F (37 C) 98.5 F (36.9 C) 97.6 F (36.4 C)  TempSrc: Oral Oral Oral Oral  SpO2: 93% 92% 90% 91%  Weight:      Height:        Intake/Output Summary (Last 24 hours) at 09/23/2018 1032 Last data filed at 09/23/2018 0604 Gross per 24 hour  Intake 3050 ml  Output 2107 ml  Net 943 ml   Filed Weights   09/21/18 1029 09/21/18 2138 09/22/18 1247  Weight: 99 kg 103.5 kg 103.5 kg    Telemetry    nsr - Personally Reviewed  ECG    none - Personally Reviewed  Physical Exam   GEN: No acute distress.   Neck: No JVD Cardiac: RRR, no murmurs, rubs, or gallops.  Respiratory: Clear to auscultation bilaterally. GI: Soft, nontender, non-distended  MS: No edema; No deformity. Right groin with clean/dry bandages Neuro:  Nonfocal  Psych: Normal affect   Labs    Chemistry Recent Labs  Lab 09/21/18 1421 09/22/18 1528 09/23/18 0318 09/23/18 0936  NA 136 140 134* 139  K 4.0 4.5 6.2* 4.2  CL 106   --  104 106  CO2 21*  --  17* 24  GLUCOSE 141*  --  125* 165*  BUN 36*  --  36* 36*  CREATININE 1.94*  --  2.01* 2.09*  CALCIUM 8.4*  --  7.7* 8.4*  GFRNONAA 32*  --  31* 30*  GFRAA 38*  --  36* 34*  ANIONGAP 9  --  13 9     Hematology Recent Labs  Lab 09/21/18 1421 09/22/18 0324 09/22/18 1528 09/23/18 0318  WBC 15.3* 13.9*  --  14.1*  RBC 3.96* 4.00*  --  2.60*  HGB 12.3* 12.4* 9.9* 8.3*  HCT 38.5* 38.3* 29.0* 25.2*  MCV 97.2 95.8  --  96.9  MCH 31.1 31.0  --  31.9  MCHC 31.9 32.4  --  32.9  RDW 13.8 13.7  --  13.8  PLT 197 185  --  138*    Cardiac EnzymesNo results for input(s): TROPONINI in the last 168 hours. No results for input(s): TROPIPOC in the last 168 hours.   BNPNo results for input(s): BNP, PROBNP in the last 168 hours.   DDimer No results for input(s): DDIMER in the last 168 hours.   Radiology    [place] Lower Ext Art Right Ltd  Result Date: 09/21/2018 CLINICAL  DATA:  Possible pseudo aneurysm after catheterization. EXAM: Right LOWER EXTREMITY ARTERIAL DUPLEX SCAN TECHNIQUE: Gray-scale sonography as well as color Doppler and duplex ultrasound was performed to evaluate the lower extremity arteries including the common, superficial and profunda femoral arteries, popliteal artery and calf arteries. COMPARISON:  None. FINDINGS: Sonographic and Doppler evaluation of the right groin and upper thigh region was performed. This demonstrates large complex fluid collection in the right groin region with layering blood or heme products consistent with hematoma. Some pulsatile movement of the blood is noted suggesting pseudoaneurysm. Doppler also demonstrates arterial flow within the pseudoaneurysm that a synchronous with arterial flow in the adjacent right common femoral artery. The pseudoaneurysm and hematoma are large. There does appear to be a neck extending from the right common femoral artery. The right common femoral artery is patent. There is also noted simultaneous flow in  the adjacent right common femoral vein suggesting arteriovenous fistula. 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 Electronically Signed   By: Lupita RaiderJames  Green Jr M.D.   On: 09/21/2018 14:25    Cardiac Studies   none  Patient Profile     78 y.o. Gregory Davila admitted with right groin bleeding after atrial fib ablation. Found to have an AV fistula.   Assessment & Plan    1. AV fistula - he is s/p surgical repair and appears to be doing well.  2. Atrial fib - he is maintaining NSR. He will continue amiodarone. We will hold off on Eliquis for a couple of days.     For questions or updates, please contact [facility] Please consult [email or web address] for contact info under Cardiology/STEMI.      Signed, Lewayne BuntingGregg Lundon Verdejo, MD  09/23/2018, 10:32 AM  Patient ID: Gregory Davila, Gregory Davila   DOB: September 02, 1940, 78 y.o.   MRN: [id number]

## 2018-09-24 ENCOUNTER — Encounter (HOSPITAL_COMMUNITY): Payer: Self-pay | Admitting: Vascular Surgery

## 2018-09-24 MED ORDER — BISACODYL 5 MG PO TBEC
10.0000 mg | DELAYED_RELEASE_TABLET | Freq: Every day | ORAL | Status: DC | PRN
  Administered 2018-09-24: 10 mg via ORAL
  Filled 2018-09-24: qty 2

## 2018-09-24 NOTE — Progress Notes (Addendum)
[facility]  Subjective  - Increased bruising  right groin into the scrotum.   Objective 113/70 78 98.5 F (36.9 C) (Oral) 15 95%  Intake/Output Summary (Last 24 hours) at 09/24/2018 0758 Last data filed at 09/24/2018 0744 Gross per 24 hour  Intake 600 ml  Output 2465 ml  Net -1865 ml    Right groin soft without frank hematoma, incision healing well.  Scrotal ecchymosis.   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 Last OP 95 cc total last 24 hours.  Groin soft without frank hematoma. Will discuss Eliquis restart with Dr. Oneida Alar.  Cardiology has Eliquis on hold for now. HGB 8.3 asymptomatic blood loss anemia 09/23/18.  No CBC ordered for today.   Gregory Davila 09/24/2018 7:58 AM --  Agree with above.  Drain output still to high to d/c.  Does not have ongoing bleeding clinically   Renal dysfunction CR 2.0 apparently baseline Acute blood loss anemia Afib in remisssion currently Will defer these issue to cardiology and primary service  [name], MD [facility] [place]: [phone] Pager: [phone]  Laboratory Lab Results: Recent Labs    09/22/18 0324 09/22/18 1528 09/23/18 0318  WBC 13.9*  --  14.1*  HGB 12.4* 9.9* 8.3*  HCT 38.3* 29.0* 25.2*  PLT 185  --  138*   BMET Recent Labs    09/23/18 0318 09/23/18 0936  NA 134* 139  K 6.2* 4.2  CL 104 106  CO2 17* 24  GLUCOSE 125* 165*  BUN 36* 36*  CREATININE 2.01* 2.09*  CALCIUM 7.7* 8.4*    COAG Lab Results  Component Value Date   INR 1.4 (H) 09/22/2018   INR 3.0 05/23/2018   INR 2.8 (H) 05/17/2018   No results found for: PTT

## 2018-09-24 NOTE — Evaluation (Signed)
Occupational Therapy Evaluation Patient Details Name: Gregory Davila MRN: [id number] DOB: [birth year] Today's Date: 09/24/2018    History of Present Illness 78 y.o. 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 Hx of CAD, MI, pAF, embolism, and HTN.   Clinical Impression   PTA patient independent and working. Admitted for above and limited by problem list below, including pain in groin, decreased activity tolerance.  He is able to complete LB ADLs with min-mod assist, transfers with supervision and mobility using RW with supervision.  He will benefit from continued OT services while admitted in order to maximize independence and safety with ADLs/mobility, anticipate no further needs after dc.     Follow Up Recommendations  No OT follow up;Supervision - Intermittent    Equipment Recommendations  None recommended by OT    Recommendations for Other Services       Precautions / Restrictions Precautions Precautions: None Restrictions Weight Bearing Restrictions: No      Mobility Bed Mobility Overal bed mobility: Needs Assistance Bed Mobility: Supine to Sit     Supine to sit: Supervision     General bed mobility comments: close supervision for safety, no phyiscal assist required  Transfers Overall transfer level: Needs assistance Equipment used: Rolling walker (2 wheeled) Transfers: Sit to/from Stand Sit to Stand: Supervision         General transfer comment: Supervision for safety. Slow to rise, VC for hand placement.    Balance Overall balance assessment: Needs assistance Sitting-balance support: No upper extremity supported;Feet supported Sitting balance-Leahy Scale: Normal     Standing balance support: Bilateral upper extremity supported;During functional activity;No upper extremity supported Standing balance-Leahy Scale: Fair Standing balance comment: relaint on BUE support dynamically, able to groom without UE support standing                            ADL either performed or assessed with clinical judgement   ADL Overall ADL's : Needs assistance/impaired     Grooming: Supervision/safety;Standing   Upper Body Bathing: Set up;Sitting   Lower Body Bathing: Minimal assistance;Sit to/from stand   Upper Body Dressing : Set up;Sitting   Lower Body Dressing: Moderate assistance;Sit to/from stand Lower Body Dressing Details (indicate cue type and reason): decreaed functional reach to R LE, reviewed compensatory techniques Toilet Transfer: Supervision/safety;RW;BSC;Ambulation   Toileting- Clothing Manipulation and Hygiene: Supervision/safety;Sit to/from stand       Functional mobility during ADLs: Supervision/safety;Rolling walker General ADL Comments: limited by pain in groin, decreased R LE ROM      Vision   Vision Assessment?: No apparent visual deficits     Perception     Praxis      Pertinent Vitals/Pain Pain Assessment: 0-10 Pain Score: 3  Pain Location: R groin/LE Pain Descriptors / Indicators: Discomfort;Grimacing;Guarding Pain Intervention(s): Monitored during session;Repositioned     Hand Dominance Right   Extremity/Trunk Assessment Upper Extremity Assessment Upper Extremity Assessment: Overall WFL for tasks assessed   Lower Extremity Assessment Lower Extremity Assessment: Defer to PT evaluation       Communication Communication Communication: No difficulties   Cognition Arousal/Alertness: Awake/alert Behavior During Therapy: WFL for tasks assessed/performed Overall Cognitive Status: Within Functional Limits for tasks assessed                                     General Comments  VSS, SPO2 on  RA  94-98%; HR 89-108    Exercises     Shoulder Instructions      Home Living Family/patient expects to be discharged to:: Private residence Living Arrangements: Spouse/significant other Available Help at Discharge: Family;Available 24 hours/day Type of  Home: House Home Access: Stairs to enter Enter[employer] CorporationEntrance Stairs-Number of Steps: 1-2 Entrance Stairs-Rails: None Home Layout: One level     Bathroom Shower/Tub: Producer, television/film/videoWalk-in shower   Bathroom Toilet: Standard     Home Equipment: Environmental consultantWalker - 4 wheels;Cane - single point;Shower seat - built in;Grab bars - toilet;Grab bars - tub/shower          Prior Functioning/Environment Level of Independence: Independent                 OT Problem List: Decreased strength;Decreased activity tolerance;Impaired balance (sitting and/or standing);Pain;Decreased knowledge of use of DME or AE      OT Treatment/Interventions: Self-care/ADL training;DME and/or AE instruction;Therapeutic activities;Patient/family education;Balance training;Therapeutic exercise    OT Goals(Current goals can be found in the care plan section) Acute Rehab OT Goals Patient Stated Goal: go home OT Goal Formulation: With patient Time For Goal Achievement: 10/08/18 Potential to Achieve Goals: Good  OT Frequency: Min 2X/week   Barriers to D/C:            Co-evaluation              AM-PAC OT "6 Clicks" Daily Activity     Outcome Measure Help from another person eating meals?: None Help from another person taking care of personal grooming?: A Little Help from another person toileting, which includes using toliet, bedpan, or urinal?: A Little Help from another person bathing (including washing, rinsing, drying)?: A Little Help from another person to put on and taking off regular upper body clothing?: None Help from another person to put on and taking off regular lower body clothing?: A Little 6 Click Score: 20   End of Session Equipment Utilized During Treatment: Rolling walker Nurse Communication: Mobility status;Other (comment)(position)  Activity Tolerance: Patient tolerated treatment well Patient left: in chair;with call bell/phone within reach  OT Visit Diagnosis: Other abnormalities of gait and mobility  (R26.89);Pain Pain - Right/Left: Right Pain - part of body: Leg(groin)                Time: [id number] OT Time Calculation (min): 21 min Charges:  OT General Charges $OT Visit: 1 Visit OT Evaluation $OT Eval Moderate Complexity: 1 Mod  Chancy Milroyhristie S Reyaansh Merlo, OT Acute Rehabilitation Services Pager [phone] Office [phone]   Chancy MilroyChristie S Swayze Kozuch 09/24/2018, 1:25 PM

## 2018-09-25 ENCOUNTER — Inpatient Hospital Stay (HOSPITAL_COMMUNITY): Payer: Medicare Other

## 2018-09-25 DIAGNOSIS — I729 Aneurysm of unspecified site: Secondary | ICD-10-CM

## 2018-09-25 DIAGNOSIS — I739 Peripheral vascular disease, unspecified: Secondary | ICD-10-CM

## 2018-09-25 DIAGNOSIS — T81718A Complication of other artery following a procedure, not elsewhere classified, initial encounter: Secondary | ICD-10-CM

## 2018-09-25 LAB — BASIC METABOLIC PANEL
Anion gap: 9 (ref 5–15)
BUN: 40 mg/dL — ABNORMAL HIGH (ref 8–23)
CO2: 28 mmol/L (ref 22–32)
Calcium: 8.7 mg/dL — ABNORMAL LOW (ref 8.9–10.3)
Chloride: 100 mmol/L (ref 98–111)
Creatinine, Ser: 1.87 mg/dL — ABNORMAL HIGH (ref 0.61–1.24)
GFR calc Af Amer: 39 mL/min — ABNORMAL LOW (ref >60)
GFR calc non Af Amer: 34 mL/min — ABNORMAL LOW (ref >60)
Glucose, Bld: 120 mg/dL — ABNORMAL HIGH (ref 70–99)
Potassium: 3.8 mmol/L (ref 3.5–5.1)
Sodium: 137 mmol/L (ref 135–145)

## 2018-09-25 LAB — CBC
HCT: 25.2 % — ABNORMAL LOW (ref 39.0–52.0)
Hemoglobin: 8.4 g/dL — ABNORMAL LOW (ref 13.0–17.0)
MCH: 32.1 pg (ref 26.0–34.0)
MCHC: 33.3 g/dL (ref 30.0–36.0)
MCV: 96.2 fL (ref 80.0–100.0)
Platelets: 224 10*3/uL (ref 150–400)
RBC: 2.62 MIL/uL — ABNORMAL LOW (ref 4.22–5.81)
RDW: 13.8 % (ref 11.5–15.5)
WBC: 13.8 10*3/uL — ABNORMAL HIGH (ref 4.0–10.5)
nRBC: 0.2 % (ref 0.0–0.2)

## 2018-09-25 MED ORDER — SODIUM CHLORIDE 0.9 % IV SOLN
INTRAVENOUS | Status: AC
  Administered 2018-09-25: 16:00:00 via INTRAVENOUS

## 2018-09-25 NOTE — Discharge Instructions (Signed)
You may advance your activity as tolerated slowly  no driving for 1 week from surgery.   You can ride in the car.   OK to shower 24 hours after drain was removed (09/26/2018 late afternoon/evening) Keep clean dry dressing over incision until no drainage.   F/U with vascular surgeon in 2-3 weeks

## 2018-09-25 NOTE — Progress Notes (Signed)
Occupational Therapy Treatment Patient Details Name: Gregory Davila MRN: [id number] DOB: 1940-08-19 Today's Date: 09/25/2018    History of present illness 78 y.o. 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 Hx of CAD, MI, pAF, embolism, and HTN.   OT comments  Patient supine in bed and agreeable to OT. Eager to [employer].  Patient completing bed mobility with supervision, transfers with supervision and grooming with supervision standing. Reviewed reverse step transfer into walk in shower using RW (as cannot reach grab bars until in shower), and reviewed safety bathing seated.  Reports spouse will assist with LB dressing to R LE (reviewed compensatory techniques), but planning to purchase long sponge for bathing.  Will follow. DC plan remains appropriate.    Follow Up Recommendations  No OT follow up;Supervision - Intermittent    Equipment Recommendations  None recommended by OT    Recommendations for Other Services      Precautions / Restrictions Precautions Precautions: None Restrictions Weight Bearing Restrictions: No       Mobility Bed Mobility Overal bed mobility: Needs Assistance Bed Mobility: Supine to Sit     Supine to sit: HOB elevated;Supervision     General bed mobility comments: supervision for safety, use of bed rails   Transfers Overall transfer level: Needs assistance Equipment used: Rolling walker (2 wheeled) Transfers: Sit to/from Stand Sit to Stand: Supervision         General transfer comment: Supervision for safety. Slow to rise, VC for hand placement.    Balance Overall balance assessment: Needs assistance Sitting-balance support: No upper extremity supported;Feet supported Sitting balance-Leahy Scale: Normal     Standing balance support: Bilateral upper extremity supported;During functional activity;No upper extremity supported Standing balance-Leahy Scale: Good                             ADL  either performed or assessed with clinical judgement   ADL Overall ADL's : Needs assistance/impaired     Grooming: Supervision/safety;Standing;Wash/dry hands               Lower Body Dressing: Minimal assistance;Sit to/from stand Lower Body Dressing Details (indicate cue type and reason): continues to have d ifficulty reaching R LE, but reports spouse will assist at [employer] Transfer: Water engineer Details (indicate cue type and reason): 3:1 over commode, cueing for safety with RW use     Tub/ Shower Transfer: Walk-in shower;Min guard;Ambulation;Shower seat;Grab Designer, jewellery Details (indicate cue type and reason): educated on reverse step transfer using RW into shower (pt has grabbars but cannot reach until into shower) Functional mobility during ADLs: Supervision/safety;Rolling walker General ADL Comments: limited by pain in groin, decreased R LE ROM      Vision   Vision Assessment?: No apparent visual deficits   Perception     Praxis      Cognition Arousal/Alertness: Awake/alert Behavior During Therapy: WFL for tasks assessed/performed Overall Cognitive Status: Within Functional Limits for tasks assessed                                          Exercises     Shoulder Instructions       General Comments VSS, reviewed mobility every hour     Pertinent Vitals/ Pain       Pain Assessment: 0-10 Pain  Score: 3  Pain Location: R groin/LE Pain Descriptors / Indicators: Discomfort;Grimacing;Guarding Pain Intervention(s): Monitored during session;Repositioned;Limited activity within patient's tolerance  Home Living Family/patient expects to be discharged to:: Private residence Living Arrangements: Spouse/significant other Available Help at Discharge: Family;Available 24 hours/day Type of Home: House Home Access: Stairs to enter [employer] of Steps:  1-2 Entrance Stairs-Rails: None Home Layout: One level     Bathroom Shower/Tub: Walk-in shower         Home Equipment: Environmental consultant - 4 wheels;Cane - single point;Shower seat - built in          Prior Functioning/Environment Level of Independence: Independent            Frequency  Min 2X/week        Progress Toward Goals  OT Goals(current goals can now be found in the care plan section)  Progress towards OT goals: Progressing toward goals  Acute Rehab OT Goals Patient Stated Goal: home today OT Goal Formulation: With patient  Plan Discharge plan remains appropriate;Frequency remains appropriate    Co-evaluation                 AM-PAC OT "6 Clicks" Daily Activity     Outcome Measure   Help from another person eating meals?: None Help from another person taking care of personal grooming?: None Help from another person toileting, which includes using toliet, bedpan, or urinal?: A Little Help from another person bathing (including washing, rinsing, drying)?: A Little Help from another person to put on and taking off regular upper body clothing?: None Help from another person to put on and taking off regular lower body clothing?: A Little 6 Click Score: 21    End of Session Equipment Utilized During Treatment: Rolling walker  OT Visit Diagnosis: Other abnormalities of gait and mobility (R26.89);Pain Pain - Right/Left: Right Pain - part of body: Leg(groin)   Activity Tolerance Patient tolerated treatment well   Patient Left in bed;with call bell/phone within reach   Nurse Communication Mobility status        Time: [id number] OT Time Calculation (min): 21 min  Charges: OT General Charges $OT Visit: 1 Visit OT Treatments $Self Care/Home Management : 8-22 mins  Delight Stare, [place] Pager [phone] Office [phone]    Delight Stare 09/25/2018, 1:44 PM

## 2018-09-25 NOTE — Progress Notes (Signed)
Pt ambulated x 150 with walker pt tolerated well

## 2018-09-25 NOTE — Care Management Important Message (Signed)
Important Message  Patient Details  Name: SEQUAN AUXIER MRN: [id number] Date of Birth: 07/12/1940   Medicare Important Message Given:  Yes     Shelda Altes 09/25/2018, 12:17 PM

## 2018-09-25 NOTE — Discharge Summary (Addendum)
DISCHARGE SUMMARY    Patient ID: Gregory Davila,  MRN: [id number], DOB/AGE: 77-24-1942 77 y.o.  Admit date: 09/21/2018 Discharge date: 09/25/2018  Primary Care Physician: Kela Millin, MD  Primary Cardiologist: Dr. Excell Seltzer Electrophysiologist: Dr. Elberta Fortis  Primary Discharge Diagnosis:  1. RFA pseudoaneurysm, AV fistula     S/p repair on 09/22/2018  Secondary Discharge Diagnosis:  1. Paroxysmal Afib     CHA2DS2Vasc is 4, on eliquis 2. CAD 3. HTN  Allergies  Allergen Reactions  . Azithromycin Nausea And Vomiting    "throws for a loop"  . Oxycodone Other (See Comments)    nightmares     Procedures This Admission:  1. 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 male with PMHx including above, underwent EPS/ablation (PVI/AFib) procedure on 09/20/2018, he had post procedure hematoma, pressure was held and was discharged with stable site.  He returned 09/21/18 with increasing pain and swelling, a drop in his Hgb was noted, [place] found large pseudoaneurysm and AV fistula.   Hospital Course:  The patient was admitted his home Eliquis held, vascular surgery consulted to the case and underwent surgical intervention 09/22/2018.  His vitals remained stable throughout, and maintained SR through his stay.  Procedure drain was removed today and cleared from vascular standpoint to discharge to home. Their discharge instructions were provided to the patient.  OK for Eliquis to resume today from vascular standpoint. H/H stable from post-op, Creat down further/improved  I have called Dr. Darrick Penna office, they Boysie Bonebrake call the patient to make his follow up appointment. The patient denies any significant pain, declines pain medicine Discussed with VVS APP, OK to discharge after drain removed.   Physical Exam:  Vitals:   09/24/18 2054 09/25/18 0530 09/25/18 0839 09/25/18 0843  BP: 110/65 (!) 96/59 129/84 129/84  Pulse: 71 64 77 77  Resp: 15 17 16    Temp: 98.5 F (36.9 C) 98.2 F (36.8 C) 98.2 F (36.8 C)   TempSrc: Oral Oral Oral   SpO2: 93% 96% 95%   Weight:      Height:        GEN- The patient is well appearing, alert and oriented x 3 today.   HEENT: normocephalic, atraumatic; sclera clear, conjunctiva pink; hearing intact; oropharynx clear; neck supple, no JVP Lungs- CTA b/l, normal work of breathing.  No wheezes, rales, rhonchi Heart- RRR, no murmurs, rubs or gallops, PMI not laterally displaced GI- soft, non-tender, non-distended, Extremities- no clubbing, cyanosis, or edema;LLE RLE, R groin surgical wound is clean, dry, dressing at drain removal site is clean/dry, large area of though ecchymosis, swelling, noted (unchanged per [atient, not enlarging) MS- no significant deformity or atrophy Skin- warm and dry, no rash or lesion Psych- euthymic mood, full affect Neuro- no gross deficits   Labs:   Lab Results  Component Value Date   WBC 13.8 (H) 09/25/2018   HGB 8.4 (L) 09/25/2018   HCT 25.2 (L) 09/25/2018   MCV 96.2 09/25/2018   PLT 224 09/25/2018    Recent Labs  Lab 09/25/18 0828  NA 137  K 3.8  CL 100  CO2 28  BUN 40*  CREATININE 1.87*  CALCIUM 8.7*  GLUCOSE 120*    Discharge Medications:  Allergies as of 09/25/2018      Reactions   Azithromycin Nausea And Vomiting   "throws  for a loop"   Oxycodone Other (See Comments)   nightmares      Medication List    TAKE these medications   allopurinol 300 MG tablet Commonly known as: ZYLOPRIM Take 0.5 tablets (150 mg total) by mouth daily.   amiodarone 200 MG tablet Commonly known as: PACERONE Take 1 tablet (200 mg total) by mouth daily.   apixaban 5 MG Tabs tablet Commonly known as: ELIQUIS Take 1 tablet (5 mg total) by mouth 2 (two) times daily.   aspirin EC 81 MG tablet Take 81 mg by mouth daily.    atorvastatin 40 MG tablet Commonly known as: LIPITOR Take 1 tablet (40 mg total) by mouth daily at 6 PM.   calcium carbonate 500 MG chewable tablet Commonly known as: TUMS - dosed in mg elemental calcium Chew 2 tablets by mouth daily as needed for indigestion or heartburn.   carvedilol 12.5 MG tablet Commonly known as: COREG Take 1 tablet (12.5 mg total) by mouth 2 (two) times daily.   chlorthalidone 25 MG tablet Commonly known as: HYGROTON Take 1 tablet (25 mg total) by mouth daily.   CLEAR EYES OP Place 1 drop into both eyes daily as needed (irritation).   Colcrys 0.6 MG tablet Generic drug: colchicine Take 0.6 mg by mouth 2 (two) times daily as needed (gout).   diltiazem 30 MG tablet Commonly known as: CARDIZEM Take 15-30 mg by mouth 2 (two) times daily as needed (increased HR).   fluticasone 50 MCG/ACT nasal spray Commonly known as: FLONASE Place 1 spray into both nostrils daily as needed for allergies or rhinitis.   ibuprofen 200 MG tablet Commonly known as: ADVIL Take 400 mg by mouth every 6 (six) hours as needed for headache or moderate pain. Notes to patient: Avoid regular use as much as possible given Eliquis   levothyroxine 50 MCG tablet Commonly known as: SYNTHROID Take 50 mcg by mouth daily before breakfast.   loratadine 10 MG tablet Commonly known as: CLARITIN Take 10 mg by mouth daily.   multivitamin per tablet Take 1 tablet by mouth daily.   nitroGLYCERIN 0.4 MG SL tablet Commonly known as: NITROSTAT Place 1 tablet (0.4 mg total) under the tongue every 5 (five) minutes x 3 doses as needed for chest pain.       Disposition:  Discharge Instructions    Diet - low sodium heart healthy   Complete by: As directed    Increase activity slowly   Complete by: As directed      Follow-up Information    Elam Dutch, MD Follow up.   Specialties: Vascular Surgery, Cardiology Why: You Larisha Vencill be called by the office to schedule a follow up  appointment.  Pelase call them if you have not heard from the office in the few days. Contact information: [address] [place] [address] [phone]        [facility] Follow up.   Specialty: Cardiology Why: 10/18/2018 @ 10:00AM Contact information: [address] [id number] [place] [address] [phone]       Constance Haw, MD Follow up.   Specialty: Cardiology Why: 12/19/2018 @ 9:45AM Contact information: [address] [place] [address] [phone]           Duration of Discharge Encounter: Greater than 30 minutes including physician time.  SignedTommye Standard, PA-C 09/25/2018 11:08 AM   I have seen and examined this patient with Tommye Standard.  Agree with  above, note added to reflect my findings.  On exam, RRR, no murmurs, lungs clear.  Patient admitted to the hospital with a pseudoaneurysm and AV fistula post atrial fibrillation ablation.  Vascular surgery was consulted and he had surgical intervention 09/22/2018.  He had a drain through the weekend but it has been DC'd since.  We Shyloh Krinke plan for discharge today with follow-up in clinic.  Ingvald Theisen M. Kashae Carstens MD 09/25/2018 11:20 AM

## 2018-09-25 NOTE — Progress Notes (Signed)
Physical Therapy Treatment Patient Details Name: Gregory Davila MRN: [id number] DOB: August 11, 1940 Today's Date: 09/25/2018    History of Present Illness 78 y.o. 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 Hx of CAD, MI, pAF, embolism, and HTN.    PT Comments    Pt agreeable to ambulation with therapy. Pt reports that he is experiencing stiffness in his R LE and reports he feels like he has a gout flare coming on. Reports he has told RN. Pt is supervision with bed mobility with HoB elevated and heavy use of bedrail. Pt reports that he will probably sleep in the recliner as he has difficulty getting out of the bed on his own. Pt supervision for transfers and for ambulation of 150 feet with  RW. Educated pt on need for activity hourly so that R LE does not stiffen up. Pt is hopeful for d/c today after drain is removed. D/c plans remain appropriate.    Follow Up Recommendations  Outpatient PT(to redevelop strength as needed, postop)     Equipment Recommendations  None recommended by PT       Precautions / Restrictions Precautions Precautions: None Restrictions Weight Bearing Restrictions: No    Mobility  Bed Mobility Overal bed mobility: Needs Assistance Bed Mobility: Supine to Sit     Supine to sit: HOB elevated;Supervision     General bed mobility comments: supervision for safety and requires increased usage of bedrail to pull to EoB, pt reports he will probably sleep in recliner when he first gets home.   Transfers Overall transfer level: Needs assistance Equipment used: Rolling walker (2 wheeled) Transfers: Sit to/from Stand Sit to Stand: Supervision         General transfer comment: Supervision for safety. Slow to rise, VC for hand placement.  Ambulation/Gait Ambulation/Gait assistance: Supervision Gait Distance (Feet): 150 Feet Assistive device: Rolling walker (2 wheeled) Gait Pattern/deviations: Step-through pattern;Decreased step length  - left;Decreased stance time - right;Decreased stride length;Antalgic Gait velocity: Decreased Gait velocity interpretation: <1.31 ft/sec, indicative of household ambulator General Gait Details: Supervision for safety. VC for safe RW use. No buckling or overt loss of balance. Light support utilized from [employer].       Balance Overall balance assessment: Needs assistance Sitting-balance support: No upper extremity supported;Feet supported Sitting balance-Leahy Scale: Normal     Standing balance support: No upper extremity supported Standing balance-Leahy Scale: Good                              Cognition Arousal/Alertness: Awake/alert Behavior During Therapy: WFL for tasks assessed/performed Overall Cognitive Status: Within Functional Limits for tasks assessed                                           General Comments General comments (skin integrity, edema, etc.): VSS      Pertinent Vitals/Pain Pain Assessment: 0-10 Pain Score: 3  Pain Location: R groin/LE Pain Descriptors / Indicators: Discomfort;Grimacing;Guarding Pain Intervention(s): Limited activity within patient's tolerance;Monitored during session;Repositioned    Home Living Family/patient expects to be discharged to:: Private residence Living Arrangements: Spouse/significant other Available Help at Discharge: Family;Available 24 hours/day Type of Home: House Home Access: Stairs to enter Entrance Stairs-Rails: None Home Layout: One level Home Equipment: Environmental consultant - 4 wheels;Cane - single point;Shower seat - built in  Prior Function Level of Independence: Independent          PT Goals (current goals can now be found in the care plan section) Acute Rehab PT Goals Patient Stated Goal: go home PT Goal Formulation: With patient Time For Goal Achievement: 10/07/18 Potential to Achieve Goals: Good Progress towards PT goals: Progressing toward goals    Frequency    Min  3X/week      PT Plan Current plan remains appropriate       AM-PAC PT "6 Clicks" Mobility   Outcome Measure  Help needed turning from your back to your side while in a flat bed without using bedrails?: None Help needed moving from lying on your back to sitting on the side of a flat bed without using bedrails?: A Little Help needed moving to and from a bed to a chair (including a wheelchair)?: None Help needed standing up from a chair using your arms (e.g., wheelchair or bedside chair)?: None Help needed to walk in hospital room?: A Little Help needed climbing 3-5 steps with a railing? : A Lot 6 Click Score: 20    End of Session Equipment Utilized During Treatment: Gait belt Activity Tolerance: Patient tolerated treatment well Patient left: in chair;with call bell/phone within reach Nurse Communication: Mobility status PT Visit Diagnosis: Other abnormalities of gait and mobility (R26.89);Difficulty in walking, not elsewhere classified (R26.2);Pain Pain - Right/Left: Right Pain - part of body: (Groin)     Time: 4854-6270 PT Time Calculation (min) (ACUTE ONLY): 16 min  Charges:  $Gait Training: 8-22 mins                     Obryan Radu B. Migdalia Dk PT, DPT Acute Rehabilitation Services Pager [phone] Office [phone]    [place] 09/25/2018, 10:48 AM

## 2018-09-25 NOTE — Progress Notes (Addendum)
[facility]  Subjective  - Doing well, worried about BM.  Reassured him that this is not a side affect of his surgery and walking with drinking plenty of water should resolve the issue.   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  He can ride in the car.  Shower 24 hours after drain D/C. Dry dressing over incision until no drainage.   F/U in our office 2-3 weeks Stable for discharge from a vascular point of view.    Gregory Davila 09/25/2018 7:30 AM --   Agree with above  [name], MD [facility]: [phone] Pager: [phone]  Laboratory Lab Results: Recent Labs    09/22/18 1528 09/23/18 0318  WBC  --  14.1*  HGB 9.9* 8.3*  HCT 29.0* 25.2*  PLT  --  138*   BMET Recent Labs    09/23/18 0318 09/23/18 0936  NA 134* 139  K 6.2* 4.2  CL 104 106  CO2 17* 24  GLUCOSE 125* 165*  BUN 36* 36*  CREATININE 2.01* 2.09*  CALCIUM 7.7* 8.4*    COAG Lab Results  Component Value Date   INR 1.4 (H) 09/22/2018   INR 3.0 05/23/2018   INR 2.8 (H) 05/17/2018   No results found for: PTT

## 2018-09-25 NOTE — Progress Notes (Signed)
Patient feels " a little lightheaded"  with ambulation today, does not feel ready for discharge/  No CP, palpitations or SOB. He did not feel lightheaded supine/in bed Less lightheaded seated, though remains slightly. 114/70, HR 70s sinus.  Will keep him today Discussed likely 2/2 to being in bed and anemia Discussed with Dr. Curt Bears, will give him some IVF Encouraged sitting up, chair intermittently to help reduce orthostatic symptoms  Tommye Standard, PA-C

## 2018-09-25 NOTE — Progress Notes (Signed)
ABI's have been completed. Preliminary results can be found in CV Proc through chart review.   09/25/18 10:14 AM Gregory Davila RVT

## 2018-09-26 MED ORDER — AMIODARONE HCL 200 MG PO TABS: 200.0000 mg | ORAL_TABLET | Freq: Two times a day (BID) | ORAL | 3 refills | Status: DC

## 2018-09-26 MED ORDER — APIXABAN 5 MG PO TABS
5.0000 mg | ORAL_TABLET | Freq: Two times a day (BID) | ORAL | Status: DC
  Administered 2018-09-26: 5 mg via ORAL
  Filled 2018-09-26: qty 1

## 2018-09-26 NOTE — Progress Notes (Addendum)
[facility]  Subjective  - Doing OK this am, but has not gotten out of bed yet today.   Objective (!) 106/55 73 98.7 F (37.1 C) (Oral) 16 90%  Intake/Output Summary (Last 24 hours) at 09/26/2018 0710 Last data filed at 09/26/2018 0427 Gross per 24 hour  Intake 244.81 ml  Output 1325 ml  Net -1080.19 ml    Right thigh with decreasing ecchymosis, soft.  Incision is healing well. Right palpable DP pulse JP site dressing clean and dry changed this am by RN. 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  He can ride in the car.  Shower 24 hours after drain D/C. Dry dressing over incision until no drainage.   F/U in our office 2-3 weeks  Roxy Horseman 09/26/2018 7:10 AM --  Agree with above  [name], MD [facility]: [phone] Pager: [phone]  Laboratory Lab Results: Recent Labs    09/25/18 0828  WBC 13.8*  HGB 8.4*  HCT 25.2*  PLT 224   BMET Recent Labs    09/23/18 0936 09/25/18 0828  NA 139 137  K 4.2 3.8  CL 106 100  CO2 24 28  GLUCOSE 165* 120*  BUN 36* 40*  CREATININE 2.09* 1.87*  CALCIUM 8.4* 8.7*    COAG Lab Results  Component Value Date   INR 1.4 (H) 09/22/2018   INR 3.0 05/23/2018   INR 2.8 (H) 05/17/2018   No results found for: PTT

## 2018-09-26 NOTE — Discharge Summary (Addendum)
DISCHARGE SUMMARY    Patient ID: Gregory Davila,  MRN: [id number], DOB/AGE: January 23, 1940 78 y.o.  Admit date: 09/21/2018 Discharge date: 09/26/2018  Primary Care Physician: Kela Millin, MD  Primary Cardiologist: Dr. Excell Seltzer Electrophysiologist: Dr. Elberta Fortis  Primary Discharge Diagnosis:  1. RFA pseudoaneurysm, AV fistula     S/p repair on 09/22/2018  Secondary Discharge Diagnosis:  1. Paroxysmal Afib     CHA2DS2Vasc is 4, on eliquis 2. CAD 3. HTN  Allergies  Allergen Reactions  . Azithromycin Nausea And Vomiting    "throws for a loop"  . Oxycodone Other (See Comments)    nightmares     Procedures This Admission:  1. 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 male with PMHx including above, underwent EPS/ablation (PVI/AFib) procedure on 09/20/2018, he had post procedure hematoma, pressure was held and was discharged with stable site.  He returned 09/21/18 with increasing pain and swelling, a drop in his Hgb was noted, [place] found large pseudoaneurysm and AV fistula.   Hospital Course:  The patient was admitted his home Eliquis held, vascular surgery consulted to the case and underwent surgical intervention 09/22/2018.  His vitals remained stable throughout his stay.  Procedure drain was removed yesterday and cleared from vascular standpoint to discharge to home. Their discharge instructions were provided to the patient.  OK for Eliquis to resume yesterday from vascular standpoint. H/H stable from post-op, Creat down further/improved.  The patient' was initially planned for discharge yesterday though had some orthostatic dizziness, felt a little weak and preferred to stay an additional day.  He was given IVF.  He is feeling better this AM, reports yesterday afternoon had felt better  particularly after having a BM.  He c/w R thigh discomfort swelling/tight feeling but a little better, and again, declines pain management.  His Eliquis was to have been resumed last night at home, though he ended up staying and not resumed until this AM.  He has had some brief self limited episodes of AFib rates 110's-120, is currently in SR 70's.  He Gregory Davila get Eliquis this AM and his Amiodarone prior to discharge, Gregory Davila increase his amiodarone to BID for now and re-evaluate this at his AFib clinic visit.  He is aware of his AFib and should be able to let us know his his burden is.  The patient was seen and examined this morning by Dr. Elberta Fortis and felt stable for discharge.   I called Dr. Darrick Penna office yesterday, they Gregory Davila call the patient to make his follow up appointment.    Physical Exam: Vitals:   09/25/18 2000 09/26/18 0425 09/26/18 0748 09/26/18 0802  BP: 99/60 (!) 106/55 (!) 101/58 (!) 101/58  Pulse: 71 73 72 72  Resp: 13 16 16    Temp: 98.7 F (37.1 C) 98.7 F (37.1 C) 98.7 F (37.1 C)   TempSrc: Oral Oral Oral   SpO2: 93% 90% 91%   Weight:      Height:        GEN- The patient is well appearing, alert and oriented x 3 today.   HEENT: normocephalic, atraumatic; sclera clear, conjunctiva pink; hearing intact; oropharynx clear; neck supple, no JVP Lungs- CTA b/l, normal work of breathing.  No wheezes, rales, rhonchi Heart- RRR, no murmurs, rubs or gallops, PMI not laterally displaced GI- soft, non-tender, non-distended, Extremities-  no clubbing, cyanosis, or edema; LLE RLE, R groin surgical wound is clean, dry, dressing at drain removal site is clean/dry, large area of though ecchymosis, swelling, noted, slightly softer then yesterday MS- no significant deformity or atrophy Skin- warm and dry, no rash or lesion Psych- euthymic mood, full affect Neuro- no gross deficits   Labs:   Lab Results  Component Value Date   WBC 13.8 (H) 09/25/2018   HGB 8.4 (L) 09/25/2018   HCT  25.2 (L) 09/25/2018   MCV 96.2 09/25/2018   PLT 224 09/25/2018    Recent Labs  Lab 09/25/18 0828  NA 137  K 3.8  CL 100  CO2 28  BUN 40*  CREATININE 1.87*  CALCIUM 8.7*  GLUCOSE 120*    Discharge Medications:  Allergies as of 09/26/2018      Reactions   Azithromycin Nausea And Vomiting   "throws for a loop"   Oxycodone Other (See Comments)   nightmares      Medication List    TAKE these medications   allopurinol 300 MG tablet Commonly known as: ZYLOPRIM Take 0.5 tablets (150 mg total) by mouth daily.   amiodarone 200 MG tablet Commonly known as: PACERONE Take 1 tablet (200 mg total) by mouth 2 (two) times daily. What changed: when to take this   apixaban 5 MG Tabs tablet Commonly known as: ELIQUIS Take 1 tablet (5 mg total) by mouth 2 (two) times daily.   aspirin EC 81 MG tablet Take 81 mg by mouth daily.   atorvastatin 40 MG tablet Commonly known as: LIPITOR Take 1 tablet (40 mg total) by mouth daily at 6 PM.   calcium carbonate 500 MG chewable tablet Commonly known as: TUMS - dosed in mg elemental calcium Chew 2 tablets by mouth daily as needed for indigestion or heartburn.   carvedilol 12.5 MG tablet Commonly known as: COREG Take 1 tablet (12.5 mg total) by mouth 2 (two) times daily.   chlorthalidone 25 MG tablet Commonly known as: HYGROTON Take 1 tablet (25 mg total) by mouth daily.   CLEAR EYES OP Place 1 drop into both eyes daily as needed (irritation).   Colcrys 0.6 MG tablet Generic drug: colchicine Take 0.6 mg by mouth 2 (two) times daily as needed (gout).   diltiazem 30 MG tablet Commonly known as: CARDIZEM Take 15-30 mg by mouth 2 (two) times daily as needed (increased HR).   fluticasone 50 MCG/ACT nasal spray Commonly known as: FLONASE Place 1 spray into both nostrils daily as needed for allergies or rhinitis.   ibuprofen 200 MG tablet Commonly known as: ADVIL Take 400 mg by mouth every 6 (six) hours as needed for headache or  moderate pain. Notes to patient: Avoid regular use as much as possible given Eliquis   levothyroxine 50 MCG tablet Commonly known as: SYNTHROID Take 50 mcg by mouth daily before breakfast.   loratadine 10 MG tablet Commonly known as: CLARITIN Take 10 mg by mouth daily.   multivitamin per tablet Take 1 tablet by mouth daily.   nitroGLYCERIN 0.4 MG SL tablet Commonly known as: NITROSTAT Place 1 tablet (0.4 mg total) under the tongue every 5 (five) minutes x 3 doses as needed for chest pain.       Disposition:  Discharge Instructions    Diet - low sodium heart healthy   Complete by: As directed    Diet - low sodium heart healthy   Complete by: As directed    Increase activity slowly  Complete by: As directed    Increase activity slowly   Complete by: As directed      Follow-up Information    Sherren Kerns, MD Follow up.   Specialties: Vascular Surgery, Cardiology Why: You Veida Spira be called by the office to schedule a follow up appointment.  P[place]ase call them if you have not heard from the office in the few days. Contact information: [address] [place] [address] [phone]        [facility] Follow up.   Specialty: Cardiology Why: 10/18/2018 @ 10:00AM Contact information: [address] [id number] mc [address] [place] [address] [phone]       Regan Lemming, MD Follow up.   Specialty: Cardiology Why: 12/19/2018 @ 9:45AM Contact information: [address] [place] [address] [phone]           Duration of Discharge Encounter: Greater than 30 minutes including physician time.  Norma Fredrickson, PA-C 09/26/2018 8:39 AM  I have seen and examined this patient with Francis Dowse.  Agree with above, note added to reflect my findings.  On exam, RRR, no murmurs, lungs clear.  He is admitted to the hospital with pseudoaneurysm and AV fistula post repair by vascular surgery.  Patient  has had short bursts of atrial fibrillation and Tashanda Fuhrer increase amiodarone for the next month.  This can be decreased at A. fib clinic visit.  Plan for discharge today with vascular surgery recommendations.  Roshard Rezabek M. Beck Cofer MD 09/26/2018 9:01 AM

## 2018-09-26 NOTE — Progress Notes (Signed)
PT provided discharge instructions and education. IV removed and intact. CCMD notified and telebox removed. PT vitals stable. Pt has no complaints. PT has all belongings. PT tx via wheelchair to valet to meet wife.  Jerald Kief, RN

## 2018-10-06 ENCOUNTER — Other Ambulatory Visit: Payer: Self-pay

## 2018-10-06 ENCOUNTER — Encounter (HOSPITAL_COMMUNITY): Payer: Self-pay | Admitting: Emergency Medicine

## 2018-10-06 ENCOUNTER — Emergency Department (HOSPITAL_COMMUNITY)
Admission: EM | Admit: 2018-10-06 | Discharge: 2018-10-06 | Disposition: A | Discharge status: Home / Self Care | Payer: Medicare Other | Attending: Emergency Medicine | Admitting: Emergency Medicine

## 2018-10-06 DIAGNOSIS — Y658 Other specified misadventures during surgical and medical care: Secondary | ICD-10-CM | POA: Insufficient documentation

## 2018-10-06 DIAGNOSIS — Z951 Presence of aortocoronary bypass graft: Secondary | ICD-10-CM | POA: Insufficient documentation

## 2018-10-06 DIAGNOSIS — Z96653 Presence of artificial knee joint, bilateral: Secondary | ICD-10-CM | POA: Insufficient documentation

## 2018-10-06 DIAGNOSIS — T8130XA Disruption of wound, unspecified, initial encounter: Secondary | ICD-10-CM | POA: Diagnosis not present

## 2018-10-06 DIAGNOSIS — I252 Old myocardial infarction: Secondary | ICD-10-CM | POA: Diagnosis not present

## 2018-10-06 DIAGNOSIS — I10 Essential (primary) hypertension: Secondary | ICD-10-CM | POA: Insufficient documentation

## 2018-10-06 DIAGNOSIS — T8189XA Other complications of procedures, not elsewhere classified, initial encounter: Secondary | ICD-10-CM | POA: Diagnosis present

## 2018-10-06 DIAGNOSIS — I2510 Atherosclerotic heart disease of native coronary artery without angina pectoris: Secondary | ICD-10-CM | POA: Insufficient documentation

## 2018-10-06 NOTE — ED Notes (Signed)
Pt presents with 2cm surgical laceration following ablation procedure to treat A-fib. Pt noted bleeding earlier today, but upon assessment, bleeding is controlled at this time. The surgical incision is deep, and gauze dressing is in place at this time.

## 2018-10-06 NOTE — ED Notes (Signed)
ED Provider at bedside. 

## 2018-10-06 NOTE — ED Triage Notes (Signed)
Pt status post recent cardiac ablation and complications from that last Thursday requiring another surgery to fix complications and today noticed that he was bleeding at surgical site. Bandage has small amount of blood to R groin but bleeding is controlled at this time.

## 2018-10-06 NOTE — ED Provider Notes (Signed)
[facility] Emergency Department Provider Note MRN:  [id number]  Arrival date & time: 10/06/18     Chief Complaint   Post-op Problem   History of Present Illness   Gregory Davila is a 78 y.o. year-old male with a history of CAD, A. fib presenting to the ED with chief complaint of postop problem.  Patient underwent ablation for A. fib a few weeks ago and then after which she experienced pain and swelling to his right leg, 2 weeks ago was diagnosed with a pseudoaneurysm and fistula to the puncture site of the ablation, underwent surgical repair.  Explained that he has been recovering well from this surgery, the swelling in his leg is improving every day.  This evening he noticed some blood on his gauze that is covering his right groin wound.  Here for evaluation.  Small amount of blood, has stopped bleeding since he arrived here.  Still taking Eliquis.  Denies pain, no fever, no other complaints.  Review of Systems  A complete 10 system review of systems was obtained and all systems are negative except as noted in the HPI and PMH.   Patient's Health History    Past Medical History:  Diagnosis Date  . Anginal pain (HCC)   . CAD (coronary artery disease)    a, 2011: occluded LAD with left to right collaterals and moderate disease in the right coronary artery and circumflex, EF 50%. b. NSTEMI 07/2011 in setting of AF-RVR, MV abnl, cath w/ LAD 100%, RCA mod-severe dz, small vessel, med rx  . Chronic renal insufficiency    followed by primary care physician  . Gout   . Hypertension    long-standing  . Myocardial infarction (HCC)   . Paroxysmal atrial fibrillation (HCC)    a. On Coumadin therapy/amidarone. b. 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  . Pulmonary embolism [facility](HCC)     Past Surgical History:  Procedure Laterality Date  . APPENDECTOMY    . ATRIAL FIBRILLATION ABLATION N/A 09/20/2018   Procedure: ATRIAL FIBRILLATION ABLATION;   Surgeon: Regan Lemmingamnitz, Will Martin, MD;  [facility] INV[facility] LAB;  Service: Cardiovascular;  Laterality: N/A;  . CARDIAC CATHETERIZATION  2011 & 2013   2013 [place], LAD 100%, CFX 50%, RCA 80%, small vessel, med rx  . CORONARY ARTERY BYPASS GRAFT N/A 02/14/2014   Procedure: CORONARY ARTERY BYPASS GRAFTING (CABG)X4 LIMA-LAD; SVG-RCA(ENDARDERECTOMY); SVG-OM; SVG-DIAG;  Surgeon: Kerin PernaPeter Van Trigt, MD;  Location: [facility] OR;  Service: Open Heart Surgery;  Laterality: N/A;  . FALSE ANEURYSM REPAIR Right 09/22/2018   Procedure: REPAIR FEMORAL PSEUDO ANEURYSM AND ARTERIOVENOUS FISTULA;  Surgeon: Sherren KernsFields, Charles E, MD;  [facility];  Service: Vascular;  Laterality: Right;  . LEFT HEART CATHETERIZATION WITH CORONARY ANGIOGRAM N/A 02/08/2014   Procedure: LEFT HEART CATHETERIZATION WITH CORONARY ANGIOGRAM;  Surgeon: Micheline ChapmanMichael D Cooper, MD;  [facility];  Service: Cardiovascular;  Laterality: N/A;  . NM MYOVIEW LTD  2013   Scar in the anteroseptal/periapical distribution with moderate peri-infarct ischemia, EF 43%  . STERNAL INCISION RECLOSURE N/A 02/28/2014   Procedure: Irrigation and Debridement of Sternum with Sternal Rewiring;  Surgeon: Kerin PernaPeter Van Trigt, MD;  [facility];  Service: Thoracic;  Laterality: N/A;  . TEE WITHOUT CARDIOVERSION N/A 02/14/2014   Procedure: TRANSESOPHAGEAL ECHOCARDIOGRAM (TEE);  Surgeon: Kerin PernaPeter Van Trigt, MD;  [facility];  Service: Open Heart Surgery;  Laterality: N/A;  . TOTAL KNEE ARTHROPLASTY Bilateral  Family History  Problem Relation Age of Onset  . Heart attack Mother   . Cancer Mother   . Prostate cancer Father   . Coronary artery disease Brother        MI    Social History   Socioeconomic History  . Marital status: Married    Spouse name: Not on file  . Number of children: Not on file  . Years of education: Not on file  . Highest education level: Not on file  Occupational History  . Occupation: Retired  Scientific laboratory technician  . Financial resource  strain: Not on file  . Food insecurity    Worry: Not on file    Inability: Not on file  . Transportation needs    Medical: Not on file    Non-medical: Not on file  Tobacco Use  . Smoking status: Former Smoker    Types: Cigarettes    Quit date: 01/04/1966    Years since quitting: 52.7  . Smokeless tobacco: Former Systems developer    Types: Chew    Quit date: 01/05/2007  . Tobacco comment: quit chewing tobacco about 4 yrs ago  Substance and Sexual Activity  . Alcohol use: Yes    Comment: "seldomly drink beer" 1 every 2-3 weeks  . Drug use: No  . Sexual activity: Yes  Lifestyle  . Physical activity    Days per week: Not on file    Minutes per session: Not on file  . Stress: Not on file  Relationships  . Social Herbalist on phone: Not on file    Gets together: Not on file    Attends religious service: Not on file    Active member of club or organization: Not on file    Attends meetings of clubs or organizations: Not on file    Relationship status: Not on file  . Intimate partner violence    Fear of current or ex partner: Not on file    Emotionally abused: Not on file    Physically abused: Not on file    Forced sexual activity: Not on file  Other Topics Concern  . Not on file  Social History Narrative  . Not on file     Physical Exam  Vital Signs and Nursing Notes reviewed Vitals:   10/06/18 2116  BP: 140/75  Pulse: 81  Resp: 18  Temp: 98.5 F (36.9 C)  SpO2: 97%    CONSTITUTIONAL: Well-appearing, NAD NEURO:  Alert and oriented x 3, no focal deficits EYES:  eyes equal and reactive ENT/NECK:  no LAD, no JVD CARDIO: Regular rate, well-perfused, normal S1 and S2 PULM:  CTAB no wheezing or rhonchi GI/GU:  normal bowel sounds, non-distended, non-tender MSK/SPINE:  No gross deformities, no edema SKIN:  no rash, atraumatic; 1 or 2 cm open wound to the right groin, no erythema, no foul odor, hemostatic PSYCH:  Appropriate speech and behavior  Diagnostic and  Interventional Summary    Labs Reviewed - No data to display  No orders to display    Medications - No data to display   Wound packing  Date/Time: 10/06/2018 10:56 PM Performed by: Maudie Flakes, MD Authorized by: Maudie Flakes, MD  Consent: Verbal consent obtained. Risks and benefits: risks, benefits and alternatives were discussed Consent given by: patient Patient identity confirmed: verbally with patient Time out: Immediately prior to procedure a "time out" was called to verify the correct patient, procedure, equipment, support staff and site/side marked as required.  Preparation: Patient was prepped and draped in the usual sterile fashion. Local anesthesia used: no  Anesthesia: Local anesthesia used: no Patient tolerance: patient tolerated the procedure well with no immediate complications    Critical Care  ED Course and Medical Decision Making  I have reviewed the triage vital signs and the nursing notes.  Pertinent labs & imaging results that were available during my care of the patient were reviewed by me and considered in my medical decision making (see below for details).  Very small amount of bleeding to this wound, 2 weeks postop, in general seems to be recovering well.  I see nothing to suggest recurrence of pseudoaneurysm or fistula or significant bleeding.  Will monitor for a brief time in the emergency department and then discharge with instructions to call surgeon first thing Monday morning.  Strict return precautions.  Upon reevaluation there has been no further bleeding.  On closer inspection it appears that the patient's right groin wound is actually a small dehiscence of a larger well-healing surgical scar.  Patient has great follow-up Monday with his primary care doctor and will call his surgeon first thing Monday morning for evaluation of this dehiscence.  The wound continues to be hemostatic, he has normal vital signs, he is appropriate for discharge.   Wound packed as described above, recommending changing of this packing on Monday with PCP or in the emergency department.  Elmer Sow. Pilar Plate, MD [facility] Emergency Medicine [facility] [email or web address]  Final Clinical Impressions(s) / ED Diagnoses     ICD-10-CM   1. Wound dehiscence  T81.30XA     ED Discharge Orders    None      Discharge Instructions Discussed with and Provided to Patient:   Discharge Instructions     You were evaluated in the Emergency Department and after careful evaluation, we did not find any emergent condition requiring admission or further testing in the hospital.  Your exam/testing today was overall reassuring.  The wound of your right groin seems to have opened up or widened slightly.  We placed some packing material here in the emergency department.  As discussed, please call your surgeon first thing Monday morning to have this wound to be examined.  We also encourage you to have your primary care doctor examine the wound on Monday and exchange the packing material if they are able to do so.  Please return to the Emergency Department if you experience any worsening of your condition.  We encourage you to follow up with a primary care provider.  Thank you for allowing [place] to be a part of your care.        Sabas Sous, MD 10/06/18 2258

## 2018-10-06 NOTE — Discharge Instructions (Addendum)
You were evaluated in the Emergency Department and after careful evaluation, we did not find any emergent condition requiring admission or further testing in the hospital.  Your exam/testing today was overall reassuring.  The wound of your right groin seems to have opened up or widened slightly.  We placed some packing material here in the emergency department.  As discussed, please call your surgeon first thing Monday morning to have this wound to be examined.  We also encourage you to have your primary care doctor examine the wound on Monday and exchange the packing material if they are able to do so.  Please return to the Emergency Department if you experience any worsening of your condition.  We encourage you to follow up with a primary care provider.  Thank you for allowing [place] to be a part of your care.

## 2018-10-18 ENCOUNTER — Other Ambulatory Visit: Payer: Self-pay

## 2018-10-18 ENCOUNTER — Encounter (HOSPITAL_COMMUNITY): Payer: Self-pay | Admitting: Nurse Practitioner

## 2018-10-18 ENCOUNTER — Ambulatory Visit (HOSPITAL_COMMUNITY)
Admission: RE | Admit: 2018-10-18 | Discharge: 2018-10-18 | Disposition: A | Discharge status: Home / Self Care | Payer: Medicare Other | Source: Ambulatory Visit | Attending: Nurse Practitioner | Admitting: Nurse Practitioner

## 2018-10-18 VITALS — BP 116/72 | HR 69 | Ht 70.0 in | Wt 221.2 lb

## 2018-10-18 DIAGNOSIS — Z8249 Family history of ischemic heart disease and other diseases of the circulatory system: Secondary | ICD-10-CM | POA: Diagnosis not present

## 2018-10-18 DIAGNOSIS — I252 Old myocardial infarction: Secondary | ICD-10-CM | POA: Diagnosis not present

## 2018-10-18 DIAGNOSIS — M109 Gout, unspecified: Secondary | ICD-10-CM | POA: Diagnosis not present

## 2018-10-18 DIAGNOSIS — I4819 Other persistent atrial fibrillation: Secondary | ICD-10-CM | POA: Diagnosis not present

## 2018-10-18 DIAGNOSIS — I2510 Atherosclerotic heart disease of native coronary artery without angina pectoris: Secondary | ICD-10-CM | POA: Diagnosis not present

## 2018-10-18 DIAGNOSIS — Z86711 Personal history of pulmonary embolism: Secondary | ICD-10-CM | POA: Diagnosis not present

## 2018-10-18 DIAGNOSIS — Z79890 Hormone replacement therapy (postmenopausal): Secondary | ICD-10-CM | POA: Diagnosis not present

## 2018-10-18 DIAGNOSIS — Z7901 Long term (current) use of anticoagulants: Secondary | ICD-10-CM | POA: Diagnosis not present

## 2018-10-18 DIAGNOSIS — Z87891 Personal history of nicotine dependence: Secondary | ICD-10-CM | POA: Diagnosis not present

## 2018-10-18 DIAGNOSIS — Z79899 Other long term (current) drug therapy: Secondary | ICD-10-CM | POA: Insufficient documentation

## 2018-10-18 DIAGNOSIS — Z885 Allergy status to narcotic agent status: Secondary | ICD-10-CM | POA: Insufficient documentation

## 2018-10-18 DIAGNOSIS — Z881 Allergy status to other antibiotic agents status: Secondary | ICD-10-CM | POA: Diagnosis not present

## 2018-10-18 DIAGNOSIS — I480 Paroxysmal atrial fibrillation: Secondary | ICD-10-CM | POA: Diagnosis present

## 2018-10-18 DIAGNOSIS — Z7982 Long term (current) use of aspirin: Secondary | ICD-10-CM | POA: Diagnosis not present

## 2018-10-18 DIAGNOSIS — I10 Essential (primary) hypertension: Secondary | ICD-10-CM | POA: Insufficient documentation

## 2018-10-18 MED ORDER — AMIODARONE HCL 200 MG PO TABS: 200.0000 mg | ORAL_TABLET | Freq: Every day | ORAL | 3 refills | Status: DC

## 2018-10-18 NOTE — Progress Notes (Signed)
Primary Care Physician: Kela MillinBarrino, Alethea Y, MD Referring Physician:Dr. Chilton GreathouseCamnitz    Gregory Davila is a 78 y.o. male with a h/o afib ablation one month ago with post op complication of pseudoaneurysm. This improving slowly. He is walking more with less discomfort.No awareness of afib. Will reduce amiodarone to 200 mg daily.  Continues on anticoagultion.    Today, he denies symptoms of palpitations, chest pain, shortness of breath, orthopnea, PND, lower extremity edema, dizziness, presyncope, syncope, or neurologic sequela. The patient is tolerating medications without difficulties and is otherwise without complaint today.   Past Medical History:  Diagnosis Date  . Anginal pain (HCC)   . CAD (coronary artery disease)    a, 2011: occluded LAD with left to right collaterals and moderate disease in the right coronary artery and circumflex, EF 50%. b. NSTEMI 07/2011 in setting of AF-RVR, MV abnl, cath w/ LAD 100%, RCA mod-severe dz, small vessel, med rx  . Chronic renal insufficiency    followed by primary care physician  . Gout   . Hypertension    long-standing  . Myocardial infarction (HCC)   . Paroxysmal atrial fibrillation (HCC)    a. On Coumadin therapy/amidarone. b. 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  . Pulmonary embolism [facility](HCC)    Past Surgical History:  Procedure Laterality Date  . APPENDECTOMY    . ATRIAL FIBRILLATION ABLATION N/A 09/20/2018   Procedure: ATRIAL FIBRILLATION ABLATION;  Surgeon: Regan Lemmingamnitz, Will Martin, MD;  [facility] INV[facility] LAB;  Service: Cardiovascular;  Laterality: N/A;  . CARDIAC CATHETERIZATION  2011 & 2013   2013 [place], LAD 100%, CFX 50%, RCA 80%, small vessel, med rx  . CORONARY ARTERY BYPASS GRAFT N/A 02/14/2014   Procedure: CORONARY ARTERY BYPASS GRAFTING (CABG)X4 LIMA-LAD; SVG-RCA(ENDARDERECTOMY); SVG-OM; SVG-DIAG;  Surgeon: Kerin PernaPeter Van Trigt, MD;  Location: [facility] OR;  Service: Open Heart Surgery;  Laterality: N/A;  .  FALSE ANEURYSM REPAIR Right 09/22/2018   Procedure: REPAIR FEMORAL PSEUDO ANEURYSM AND ARTERIOVENOUS FISTULA;  Surgeon: Sherren KernsFields, Charles E, MD;  [facility];  Service: Vascular;  Laterality: Right;  . LEFT HEART CATHETERIZATION WITH CORONARY ANGIOGRAM N/A 02/08/2014   Procedure: LEFT HEART CATHETERIZATION WITH CORONARY ANGIOGRAM;  Surgeon: Micheline ChapmanMichael D Cooper, MD;  [facility];  Service: Cardiovascular;  Laterality: N/A;  . NM MYOVIEW LTD  2013   Scar in the anteroseptal/periapical distribution with moderate peri-infarct ischemia, EF 43%  . STERNAL INCISION RECLOSURE N/A 02/28/2014   Procedure: Irrigation and Debridement of Sternum with Sternal Rewiring;  Surgeon: Kerin PernaPeter Van Trigt, MD;  [facility];  Service: Thoracic;  Laterality: N/A;  . TEE WITHOUT CARDIOVERSION N/A 02/14/2014   Procedure: TRANSESOPHAGEAL ECHOCARDIOGRAM (TEE);  Surgeon: Kerin PernaPeter Van Trigt, MD;  [facility];  Service: Open Heart Surgery;  Laterality: N/A;  . TOTAL KNEE ARTHROPLASTY Bilateral     Current Outpatient Medications  Medication Sig Dispense Refill  . allopurinol (ZYLOPRIM) 300 MG tablet Take 0.5 tablets (150 mg total) by mouth daily. 30 tablet 1  . amiodarone (PACERONE) 200 MG tablet Take 1 tablet (200 mg total) by mouth 2 (two) times daily. 60 tablet 3  . apixaban (ELIQUIS) 5 MG TABS tablet Take 1 tablet (5 mg total) by mouth 2 (two) times daily. 60 tablet 5  . aspirin EC 81 MG tablet Take 81 mg by mouth daily.    [name]. atorvastatin (LIPITOR) 40 MG tablet Take 1 tablet (40 mg total) by mouth daily at 6 PM.  30 tablet 1  . calcium carbonate (TUMS - DOSED IN MG ELEMENTAL CALCIUM) 500 MG chewable tablet Chew 2 tablets by mouth as needed for indigestion or heartburn.     . carvedilol (COREG) 12.5 MG tablet Take 1 tablet (12.5 mg total) by mouth 2 (two) times daily. 180 tablet 3  . cephALEXin (KEFLEX) 500 MG capsule Take by mouth.    . chlorthalidone (HYGROTON) 25 MG tablet Take 1 tablet (25 mg total) by mouth  daily. 90 tablet 3  . COLCRYS 0.6 MG tablet Take 0.6 mg by mouth as needed (gout).     . fluticasone (FLONASE) 50 MCG/ACT nasal spray Place 1 spray into both nostrils daily as needed for allergies or rhinitis.    [name] levothyroxine (SYNTHROID) 50 MCG tablet Take 50 mcg by mouth daily before breakfast.     . loratadine (CLARITIN) 10 MG tablet Take 10 mg by mouth daily.    . multivitamin (THERAGRAN) per tablet Take 1 tablet by mouth daily.      . Naphazoline HCl (CLEAR EYES OP) Place 1 drop into both eyes daily as needed (irritation).    . nitroGLYCERIN (NITROSTAT) 0.4 MG SL tablet Place 1 tablet (0.4 mg total) under the tongue every 5 (five) minutes x 3 doses as needed for chest pain. 25 tablet 2  . diltiazem (CARDIZEM) 30 MG tablet Take 15-30 mg by mouth 2 (two) times daily as needed (increased HR).     . NON FORMULARY Take by mouth.     No current facility-administered medications for this encounter.     Allergies  Allergen Reactions  . Azithromycin Nausea And Vomiting    "throws for a loop"  . Oxycodone Other (See Comments)    nightmares    Social History   Socioeconomic History  . Marital status: Married    Spouse name: Not on file  . Number of children: Not on file  . Years of education: Not on file  . Highest education level: Not on file  Occupational History  . Occupation: Retired  Scientific laboratory technician  . Financial resource strain: Not on file  . Food insecurity    Worry: Not on file    Inability: Not on file  . Transportation needs    Medical: Not on file    Non-medical: Not on file  Tobacco Use  . Smoking status: Former Smoker    Types: Cigarettes    Quit date: 01/04/1966    Years since quitting: 52.8  . Smokeless tobacco: Former Systems developer    Types: Chew    Quit date: 01/05/2007  . Tobacco comment: quit chewing tobacco about 4 yrs ago  Substance and Sexual Activity  . Alcohol use: Yes    Comment: "seldomly drink beer" 1 every 2-3 weeks  . Drug use: No  . Sexual activity: Yes   Lifestyle  . Physical activity    Days per week: Not on file    Minutes per session: Not on file  . Stress: Not on file  Relationships  . Social Herbalist on phone: Not on file    Gets together: Not on file    Attends religious service: Not on file    Active member of club or organization: Not on file    Attends meetings of clubs or organizations: Not on file    Relationship status: Not on file  . Intimate partner violence    Fear of current or ex partner: Not on file    Emotionally  abused: Not on file    Physically abused: Not on file    Forced sexual activity: Not on file  Other Topics Concern  . Not on file  Social History Narrative  . Not on file    Family History  Problem Relation Age of Onset  . Heart attack Mother   . Cancer Mother   . Prostate cancer Father   . Coronary artery disease Brother        MI    ROS- All systems are reviewed and negative except as per the HPI above  Physical Exam: Vitals:   10/18/18 1008  BP: 116/72  Pulse: 69  Weight: 100.3 kg  Height: 5\' 10"  (1.778 m)   Wt Readings from Last 3 Encounters:  10/18/18 100.3 kg  10/06/18 102.5 kg  09/22/18 103.5 kg    Labs: Lab Results  Component Value Date   NA 137 09/25/2018   K 3.8 09/25/2018   CL 100 09/25/2018   CO2 28 09/25/2018   GLUCOSE 120 (H) 09/25/2018   BUN 40 (H) 09/25/2018   CREATININE 1.87 (H) 09/25/2018   CALCIUM 8.7 (L) 09/25/2018   PHOS 3.0 11/02/2009   MG 1.9 01/28/2016   Lab Results  Component Value Date   INR 1.4 (H) 09/22/2018   Lab Results  Component Value Date   CHOL 135 03/24/2017   HDL 41 03/24/2017   LDLCALC 75 03/24/2017   TRIG 94 03/24/2017     GEN- The patient is well appearing, alert and oriented x 3 today.   Head- normocephalic, atraumatic Eyes-  Sclera clear, conjunctiva pink Ears- hearing intact Oropharynx- clear Neck- supple, no JVP Lymph- no cervical lymphadenopathy Lungs- Clear to ausculation bilaterally, normal work of  breathing Heart- Regular rate and rhythm, no murmurs, rubs or gallops, PMI not laterally displaced GI- soft, NT, ND, + BS Extremities- no clubbing, cyanosis, or edema MS- no significant deformity or atrophy Skin- no rash or lesion Psych- euthymic mood, full affect Neuro- strength and sensation are intact  EKG-NSR at 69 bpm, print 162 ms, qrs int 94 ms, qtc 437 ms    Assessment and Plan: 1. Afib Enjoying SR since procedure Will reduce amiodarone to 200 mg daily Continue carvedilol 12.5 mg bid   2. CHA2DS2VASc score of at least 5  Continue apixaban 5 mg bid  Aware not to interrupt anticoagulation during the 3 month healing period  3. Rt pseudoaneurysm Improving Has f/u with vascular tommorrow    F/u with Dr. 03/26/2017 12/15   1/16 C. Lup[facility] Le[facility] [address] [place], [place] [phone]

## 2018-10-19 ENCOUNTER — Ambulatory Visit (INDEPENDENT_AMBULATORY_CARE_PROVIDER_SITE_OTHER): Payer: Self-pay | Admitting: Vascular Surgery

## 2018-10-19 ENCOUNTER — Encounter: Payer: Self-pay | Admitting: Vascular Surgery

## 2018-10-19 VITALS — BP 129/72 | HR 75 | Temp 97.4°F | Resp 20 | Ht 70.0 in | Wt 219.0 lb

## 2018-10-19 DIAGNOSIS — I724 Aneurysm of artery of lower extremity: Secondary | ICD-10-CM

## 2018-10-19 NOTE — Progress Notes (Signed)
    Patient is a 78 year old male who returns today for postoperative follow-up after repair of a right femoral artery pseudoaneurysm and AV fistula after an ablation procedure by Dr. Curt Bears.  He is postoperative day #27.  Most of the pain in his right groin has completely resolved.  He did have a small amount of drainage but this is improving.  Physical exam:  Vitals:   10/19/18 0832  BP: 129/72  Pulse: 75  Resp: 20  Temp: (!) 97.4 F (36.3 C)  SpO2: 96%  Weight: 219 lb (99.3 kg)  Height: 5\' 10"  (1.778 m)    Right groin some separation of the inferior 2 cm of the incision with healthy-appearing granulation tissue less than 1 mm depth overall incision is clean no significant drainage or erythema  2+ posterior tibial pulse right foot absent dorsalis pedis pulse  Assessment: Doing well status post repair of right common femoral artery pseudoaneurysm and AV fistula after ablation  Plan: The patient will follow up on an as-needed basis if the incision has not completely healed in a few weeks.  [name], MD [facility] Office: [phone] Pager: [phone]

## 2018-10-30 ENCOUNTER — Other Ambulatory Visit: Payer: Self-pay | Admitting: Cardiovascular Disease

## 2018-10-30 NOTE — Telephone Encounter (Signed)
Pt last saw Roderic Palau, NP on 10/18/18, last labs 09/25/18 Creat 1.87, age 78, weight 99.3kg, based on specified criteria pt is on appropriate dosage of Eliquis 5mg  BID.  Will refill rx.

## 2018-12-19 ENCOUNTER — Other Ambulatory Visit: Payer: Self-pay

## 2018-12-19 ENCOUNTER — Ambulatory Visit: Payer: Medicare Other | Admitting: Cardiology

## 2018-12-19 ENCOUNTER — Encounter: Payer: Self-pay | Admitting: Cardiology

## 2018-12-19 VITALS — BP 132/70 | HR 68 | Ht 70.0 in | Wt 225.0 lb

## 2018-12-19 DIAGNOSIS — I480 Paroxysmal atrial fibrillation: Secondary | ICD-10-CM | POA: Diagnosis not present

## 2018-12-19 NOTE — Progress Notes (Signed)
Electrophysiology Office Note   Date:  12/19/2018   ID:  Gregory Davila, DOB 11-13-1940, MRN [id number]  PCP:  Kela Millin, MD  Cardiologist:  Excell Seltzer Primary Electrophysiologist:  Remberto Lienhard Jorja Loa, MD    No chief complaint on file.    History of Present Illness: Gregory Davila is a 78 y.o. male who is being seen today for the evaluation of AF at the request of [place], [facility]. Presenting today for electrophysiology evaluation.  He has a history significant for multivessel CABG in 2016 after presenting with non-STEMI.  He also has persistent atrial fibrillation, chronic systolic heart failure, hypertension and CKD.  He presented in the emergency room recently with atrial fibrillation and rapid rates.  He was rate controlled and discharged home on oral AV nodal blocking agents.  He has had 3 episodes of symptomatic atrial fibrillation in the last month despite amiodarone compliance.  He had an episode that lasted approximately 2 hours following a round of golf.  He had butterflies in his chest.  He has now status post AF ablation 09/20/2018.  Today, denies symptoms of palpitations, chest pain, shortness of breath, orthopnea, PND, lower extremity edema, claudication, dizziness, presyncope, syncope, bleeding, or neurologic sequela. The patient is tolerating medications without difficulties.  He fortunately remains in sinus rhythm.  Unfortunately during his procedure, he had an AV fistula requiring operation.  He continues to have issues with mobility of his right leg.   Past Medical History:  Diagnosis Date  . Anginal pain (HCC)   . CAD (coronary artery disease)    a, 2011: occluded LAD with left to right collaterals and moderate disease in the right coronary artery and circumflex, EF 50%. b. NSTEMI 07/2011 in setting of AF-RVR, MV abnl, cath w/ LAD 100%, RCA mod-severe dz, small vessel, med rx  . Chronic renal insufficiency    followed by primary care physician  . Gout     . Hypertension    long-standing  . Myocardial infarction (HCC)   . Paroxysmal atrial fibrillation (HCC)    a. On Coumadin therapy/amidarone. b. 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  . Pulmonary embolism [facility])    Past Surgical History:  Procedure Laterality Date  . APPENDECTOMY    . ATRIAL FIBRILLATION ABLATION N/A 09/20/2018   Procedure: ATRIAL FIBRILLATION ABLATION;  Surgeon: Regan Lemming, MD;  Location: [facility] INVASIVE CV LAB;  Service: Cardiovascular;  Laterality: N/A;  . CARDIAC CATHETERIZATION  2011 & 2013   2013 L main [place], LAD 100%, CFX 50%, RCA 80%, small vessel, med rx  . CORONARY ARTERY BYPASS GRAFT N/A 02/14/2014   Procedure: CORONARY ARTERY BYPASS GRAFTING (CABG)X4 LIMA-LAD; SVG-RCA(ENDARDERECTOMY); SVG-OM; SVG-DIAG;  Surgeon: Kerin Perna, MD;  Location: [facility] OR;  Service: Open Heart Surgery;  Laterality: N/A;  . FALSE ANEURYSM REPAIR Right 09/22/2018   Procedure: REPAIR FEMORAL PSEUDO ANEURYSM AND ARTERIOVENOUS FISTULA;  Surgeon: Sherren Kerns, MD;  Location: [facility] OR;  Service: Vascular;  Laterality: Right;  . LEFT HEART CATHETERIZATION WITH CORONARY ANGIOGRAM N/A 02/08/2014   Procedure: LEFT HEART CATHETERIZATION WITH CORONARY ANGIOGRAM;  Surgeon: Micheline Chapman, MD;  Location: [facility] CATH LAB;  Service: Cardiovascular;  Laterality: N/A;  . NM MYOVIEW LTD  2013   Scar in the anteroseptal/periapical distribution with moderate peri-infarct ischemia, EF 43%  . STERNAL INCISION RECLOSURE N/A 02/28/2014   Procedure: Irrigation and Debridement of Sternum with Sternal Rewiring;  Surgeon: Kerin Perna, MD;  Location: [facility]  OR;  Service: Thoracic;  Laterality: N/A;  . TEE WITHOUT CARDIOVERSION N/A 02/14/2014   Procedure: TRANSESOPHAGEAL ECHOCARDIOGRAM (TEE);  Surgeon: Kerin PernaPeter Van Trigt, MD;  [facility];  Service: Open Heart Surgery;  Laterality: N/A;  . TOTAL KNEE ARTHROPLASTY Bilateral      Current Outpatient Medications  Medication Sig Dispense  Refill  . allopurinol (ZYLOPRIM) 300 MG tablet Take 0.5 tablets (150 mg total) by mouth daily. 30 tablet 1  . aspirin EC 81 MG tablet Take 81 mg by mouth daily.    [name]. atorvastatin (LIPITOR) 40 MG tablet Take 1 tablet (40 mg total) by mouth daily at 6 PM. 30 tablet 1  . calcium carbonate (TUMS - DOSED IN MG ELEMENTAL CALCIUM) 500 MG chewable tablet Chew 2 tablets by mouth as needed for indigestion or heartburn.     . carvedilol (COREG) 12.5 MG tablet Take 1 tablet (12.5 mg total) by mouth 2 (two) times daily. 180 tablet 3  . chlorthalidone (HYGROTON) 25 MG tablet Take 1 tablet (25 mg total) by mouth daily. 90 tablet 3  . COLCRYS 0.6 MG tablet Take 0.6 mg by mouth as needed (gout).     [name]. diltiazem (CARDIZEM) 30 MG tablet Take 15-30 mg by mouth 2 (two) times daily as needed (increased HR).     [name]. ELIQUIS 5 MG TABS tablet TAKE ONE TABLET BY MOUTH TWICE DAILY. 60 tablet 5  . fluticasone (FLONASE) 50 MCG/ACT nasal spray Place 1 spray into both nostrils daily as needed for allergies or rhinitis.    [name]. levothyroxine (SYNTHROID) 50 MCG tablet Take 50 mcg by mouth daily before breakfast.     . loratadine (CLARITIN) 10 MG tablet Take 10 mg by mouth daily.    . multivitamin (THERAGRAN) per tablet Take 1 tablet by mouth daily.      . Naphazoline HCl (CLEAR EYES OP) Place 1 drop into both eyes daily as needed (irritation).    . nitroGLYCERIN (NITROSTAT) 0.4 MG SL tablet Place 1 tablet (0.4 mg total) under the tongue every 5 (five) minutes x 3 doses as needed for chest pain. 25 tablet 2  . NON FORMULARY Take by mouth.     No current facility-administered medications for this visit.    Allergies:   Azithromycin and Oxycodone   Social History:  The patient  reports that he quit smoking about 52 years ago. His smoking use included cigarettes. He quit smokeless tobacco use about 11 years ago.  His smokeless tobacco use included chew. He reports current alcohol use. He reports that he does not use drugs.   Family  History:  The patient's family history includes Cancer in his mother; Coronary artery disease in his brother; Heart attack in his mother; Prostate cancer in his father.    ROS:  Please see the history of present illness.   Otherwise, review of systems is positive for none.   All other systems are reviewed and negative.   PHYSICAL EXAM: VS:  BP 132/70   Pulse 68   Ht 5\' 10"  (1.778 m)   Wt 225 lb (102.1 kg)   SpO2 94%   BMI 32.28 kg/m  , BMI Body mass index is 32.28 kg/m. GEN: Well nourished, well developed, in no acute distress  HEENT: normal  Neck: no JVD, carotid bruits, or masses Cardiac: RRR; no murmurs, rubs, or gallops,no edema  Respiratory:  clear to auscultation bilaterally, normal work of breathing GI: soft, nontender, nondistended, + BS MS: no deformity or atrophy  Skin:  warm and dry Neuro:  Strength and sensation are intact Psych: euthymic mood, full affect  EKG:  EKG is ordered today. Personal review of the ekg ordered shows sinus rhythm, rate 68  Recent Labs: 05/17/2018: B Natriuretic Peptide 86.0 09/25/2018: BUN 40; Creatinine, Ser 1.87; Hemoglobin 8.4; Platelets 224; Potassium 3.8; Sodium 137    Lipid Panel     Component Value Date/Time   CHOL 135 03/24/2017 1247   TRIG 94 03/24/2017 1247   HDL 41 03/24/2017 1247   CHOLHDL 3.3 03/24/2017 1247   CHOLHDL 3.3 04/01/2015 0739   VLDL 22 04/01/2015 0739   LDLCALC 75 03/24/2017 1247     Wt Readings from Last 3 Encounters:  12/19/18 225 lb (102.1 kg)  10/19/18 219 lb (99.3 kg)  10/18/18 221 lb 3.2 oz (100.3 kg)      Other studies Reviewed: Additional studies/ records that were reviewed today include: TTE 07/13/18  Review of the above records today demonstrates:   1. The left ventricle has normal systolic function, with an ejection fraction of 55-60%. The cavity size was normal. There is mild concentric left ventricular hypertrophy. Left ventricular diastolic Doppler parameters are consistent with impaired    relaxation. No evidence of left ventricular regional wall motion abnormalities.  2. The right ventricle has mildly reduced systolic function. The cavity was normal. There is mildly increased right ventricular wall thickness.  3. The mitral valve is degenerative. Mild thickening of the mitral valve leaflet. There is mild mitral annular calcification present.  4. The tricuspid valve is grossly normal.  5. The aortic valve is tricuspid. Mild thickening of the aortic valve. Moderate calcification of the aortic valve. Aortic valve regurgitation is moderate by color flow Doppler. Mild stenosis of the aortic valve.  6. Severe aortic annular calcification.  7. There is mild dilatation of the aortic root measuring 40 mm.   ASSESSMENT AND PLAN:  1.  Persistent atrial fibrillation: Currently on amiodarone, Coreg, Eliquis.  CHA2DS2-VASc of 4.  He is now status post AF ablation 09/20/2018.  We Natlie Asfour plan to stop amiodarone.   2.  Coronary artery disease: Status post CABG.  No current chest pain.  3.  Hypertension: Currently well controlled  4.  AV fistula: Operation by Dr. Oneida Alar.  Continues to have issues with mobility of his right leg.  I have told him that this should improve over time.    Current medicines are reviewed at length with the patient today.   The patient does not have concerns regarding his medicines.  The following changes were made today: Stop amiodarone  Labs/ tests ordered today include:  Orders Placed This Encounter  Procedures  . EKG 12-Lead    Disposition:   FU with Rockie Vawter 3 months  Signed, Taneka Espiritu Meredith Leeds, MD  12/19/2018 9:58 AM     [facility] [address] [place] [address] [phone] (office) [phone] (fax)

## 2018-12-19 NOTE — Patient Instructions (Signed)
Medication Instructions:  Your physician has recommended you make the following change in your medication:  1. STOP Amiodarone  * If you need a refill on your cardiac medications before your next appointment, please call your pharmacy.   Labwork: None ordered  Testing/Procedures: None ordered  Follow-Up: At [facility], you and your health needs are our priority.  As part of our continuing mission to provide you with exceptional heart care, we have created designated Provider Care Teams.  These Care Teams include your primary Cardiologist (physician) and Advanced Practice Providers (APPs -  Physician Assistants and Nurse Practitioners) who all work together to provide you with the care you need, when you need it.  You will need a follow up appointment in 3 months.  Please call our office 2 months in advance to schedule this appointment.  You may see Dr Camnitz or one of the following Advanced Practice Providers on your designated Care Team:    Amber Seiler, NP  Renee Ursuy, PA-C  Andy Tillery, PA-C  Thank you for choosing [facility]!!   Lidiya Reise, RN [phone]         

## 2019-01-18 ENCOUNTER — Other Ambulatory Visit: Payer: Self-pay

## 2019-01-18 ENCOUNTER — Ambulatory Visit (INDEPENDENT_AMBULATORY_CARE_PROVIDER_SITE_OTHER): Payer: Medicare Other | Admitting: *Deleted

## 2019-01-18 DIAGNOSIS — Z5181 Encounter for therapeutic drug level monitoring: Secondary | ICD-10-CM | POA: Diagnosis not present

## 2019-01-18 DIAGNOSIS — I480 Paroxysmal atrial fibrillation: Secondary | ICD-10-CM

## 2019-01-18 NOTE — Progress Notes (Signed)
Pt requested to change from Warfarin to Eliquis.  Pt took last dose of warfarin on 05/22/18 and started Eliquis 5mg  twice daily for PAF on 05/24/18.    Labs on 05/17/18:  SCr 1.74, Hgb 16.7, Hct 49.2  Plts 209  Reviewed patients medication list.  Pt is not currently on any combined P-gp and strong CYP3A4 inhibitors/inducers (ketoconazole, traconazole, ritonavir, carbamazepine, phenytoin, rifampin, St. John's wort).  Reviewed labs from 01/19/19:  SCr 1.88, Weight 101.1kg CrCl 47.47.  Dose is appropriate based on age, weight, and SCr.  Hgb and HCT: 15.7/47.6  A full discussion of the nature of anticoagulants has been carried out.  A benefit/risk analysis has been presented to the patient, so that they understand the justification for choosing anticoagulation with Eliquis at this time.  The need for compliance is stressed.  Pt is aware to take the medication twice daily.  Side effects of potential bleeding are discussed, including unusual colored urine or stools, coughing up blood or coffee ground emesis, nose bleeds or serious fall or head trauma.  Discussed signs and symptoms of stroke. The patient should avoid any OTC items containing aspirin or ibuprofen.  Avoid alcohol consumption.   Call if any signs of abnormal bleeding.  Discussed financial obligations and resolved any difficulty in obtaining medication.  Next lab test in 6 months.   Discussed lab results with patient.  SCr stable.  Hgb/Hct back to normal after hematoma groin area post ablation.  6 month follow up appt made.  Pt in agreement.

## 2019-01-20 LAB — CBC
HCT: 47.6 % (ref 38.5–50.0)
Hemoglobin: 15.7 g/dL (ref 13.2–17.1)
MCH: 29.6 pg (ref 27.0–33.0)
MCHC: 33 g/dL (ref 32.0–36.0)
MCV: 89.8 fL (ref 80.0–100.0)
MPV: 10.1 fL (ref 7.5–12.5)
Platelets: 221 10*3/uL (ref 140–400)
RBC: 5.3 10*6/uL (ref 4.20–5.80)
RDW: 13.9 % (ref 11.0–15.0)
WBC: 5.6 10*3/uL (ref 3.8–10.8)

## 2019-01-20 LAB — BASIC METABOLIC PANEL
BUN/Creatinine Ratio: 18 (calc) (ref 6–22)
BUN: 33 mg/dL — ABNORMAL HIGH (ref 7–25)
CO2: 24 mmol/L (ref 20–32)
Calcium: 9.4 mg/dL (ref 8.6–10.3)
Chloride: 103 mmol/L (ref 98–110)
Creat: 1.88 mg/dL — ABNORMAL HIGH (ref 0.70–1.18)
Glucose, Bld: 125 mg/dL (ref 65–139)
Potassium: 3.7 mmol/L (ref 3.5–5.3)
Sodium: 138 mmol/L (ref 135–146)

## 2019-02-09 ENCOUNTER — Other Ambulatory Visit (HOSPITAL_COMMUNITY): Payer: Self-pay | Admitting: Nephrology

## 2019-02-09 DIAGNOSIS — N1832 Chronic kidney disease, stage 3b: Secondary | ICD-10-CM

## 2019-02-09 DIAGNOSIS — I10 Essential (primary) hypertension: Secondary | ICD-10-CM

## 2019-02-19 ENCOUNTER — Other Ambulatory Visit: Payer: Self-pay

## 2019-02-19 ENCOUNTER — Ambulatory Visit (HOSPITAL_COMMUNITY)
Admission: RE | Admit: 2019-02-19 | Discharge: 2019-02-19 | Disposition: A | Discharge status: Home / Self Care | Payer: Medicare Other | Source: Ambulatory Visit | Attending: Nephrology | Admitting: Nephrology

## 2019-02-19 DIAGNOSIS — N1832 Chronic kidney disease, stage 3b: Secondary | ICD-10-CM | POA: Diagnosis present

## 2019-02-19 DIAGNOSIS — I10 Essential (primary) hypertension: Secondary | ICD-10-CM | POA: Insufficient documentation

## 2019-03-05 DIAGNOSIS — Z0000 Encounter for general adult medical examination without abnormal findings: Secondary | ICD-10-CM | POA: Diagnosis not present

## 2019-03-05 DIAGNOSIS — I5042 Chronic combined systolic (congestive) and diastolic (congestive) heart failure: Secondary | ICD-10-CM | POA: Diagnosis not present

## 2019-03-05 DIAGNOSIS — N1831 Chronic kidney disease, stage 3a: Secondary | ICD-10-CM | POA: Diagnosis not present

## 2019-03-05 DIAGNOSIS — I4891 Unspecified atrial fibrillation: Secondary | ICD-10-CM | POA: Diagnosis not present

## 2019-03-05 DIAGNOSIS — J310 Chronic rhinitis: Secondary | ICD-10-CM | POA: Diagnosis not present

## 2019-03-20 ENCOUNTER — Ambulatory Visit: Payer: Medicare Other | Admitting: Cardiology

## 2019-03-29 DIAGNOSIS — E559 Vitamin D deficiency, unspecified: Secondary | ICD-10-CM | POA: Diagnosis not present

## 2019-03-29 DIAGNOSIS — I5032 Chronic diastolic (congestive) heart failure: Secondary | ICD-10-CM | POA: Diagnosis not present

## 2019-03-29 DIAGNOSIS — N1832 Chronic kidney disease, stage 3b: Secondary | ICD-10-CM | POA: Diagnosis not present

## 2019-03-29 DIAGNOSIS — I10 Essential (primary) hypertension: Secondary | ICD-10-CM | POA: Diagnosis not present

## 2019-03-29 DIAGNOSIS — N200 Calculus of kidney: Secondary | ICD-10-CM | POA: Diagnosis not present

## 2019-04-10 ENCOUNTER — Other Ambulatory Visit: Payer: Self-pay | Admitting: Cardiovascular Disease

## 2019-04-23 ENCOUNTER — Other Ambulatory Visit: Payer: Self-pay

## 2019-04-23 DIAGNOSIS — I351 Nonrheumatic aortic (valve) insufficiency: Secondary | ICD-10-CM

## 2019-05-07 DIAGNOSIS — E559 Vitamin D deficiency, unspecified: Secondary | ICD-10-CM | POA: Diagnosis not present

## 2019-05-07 DIAGNOSIS — I10 Essential (primary) hypertension: Secondary | ICD-10-CM | POA: Diagnosis not present

## 2019-05-07 DIAGNOSIS — N200 Calculus of kidney: Secondary | ICD-10-CM | POA: Diagnosis not present

## 2019-05-07 DIAGNOSIS — I5032 Chronic diastolic (congestive) heart failure: Secondary | ICD-10-CM | POA: Diagnosis not present

## 2019-05-07 DIAGNOSIS — N1832 Chronic kidney disease, stage 3b: Secondary | ICD-10-CM | POA: Diagnosis not present

## 2019-06-05 ENCOUNTER — Encounter: Payer: Self-pay | Admitting: Cardiology

## 2019-06-05 ENCOUNTER — Ambulatory Visit: Payer: Medicare Other | Admitting: Cardiology

## 2019-06-05 ENCOUNTER — Other Ambulatory Visit: Payer: Self-pay

## 2019-06-05 VITALS — BP 124/82 | HR 71 | Ht 71.0 in | Wt 229.0 lb

## 2019-06-05 DIAGNOSIS — I4819 Other persistent atrial fibrillation: Secondary | ICD-10-CM

## 2019-06-05 NOTE — Progress Notes (Signed)
Electrophysiology Office Note   Date:  06/05/2019   ID:  Gregory Davila, DOB November 18, 1940, MRN [id number]  PCP:  Leeanne Rio, MD  Cardiologist:  Burt Knack Primary Electrophysiologist:  Will Meredith Leeds, MD    No chief complaint on file.    History of Present Illness: Gregory Davila is a 79 y.o. male who is being seen today for the evaluation of AF at the request of Rucker, Nicole Kindred, MD. Presenting today for electrophysiology evaluation.  He has a history significant for multivessel CABG in 2016 after presenting with non-STEMI.  He also has persistent atrial fibrillation, chronic systolic heart failure, hypertension and CKD.  He presented in the emergency room recently with atrial fibrillation and rapid rates.  He was rate controlled and discharged home on oral AV nodal blocking agents.  He has had 3 episodes of symptomatic atrial fibrillation in the last month despite amiodarone compliance.  He had an episode that lasted approximately 2 hours following a round of golf.  He had butterflies in his chest.  He has now status post AF ablation 09/20/2018.  Today, denies symptoms of palpitations, chest pain, shortness of breath, orthopnea, PND, lower extremity edema, claudication, dizziness, presyncope, syncope, bleeding, or neurologic sequela. The patient is tolerating medications without difficulties.  He is noted no further episodes of atrial fibrillation.  He continues to do well.  His only issue is continued discomfort in his right leg from his AV fistula which was a complication of his ablation.  Aside from that he has done well without complaint.   Past Medical History:  Diagnosis Date  . Anginal pain ([place])   . CAD (coronary artery disease)    a, 2011: occluded LAD with left to right collaterals and moderate disease in the right coronary artery and circumflex, EF 50%. b. NSTEMI 07/2011 in setting of AF-RVR, MV abnl, cath w/ LAD 100%, RCA mod-severe dz, small vessel, med rx  . Chronic  renal insufficiency    followed by primary care physician  . Gout   . Hypertension    long-standing  . Myocardial infarction ([place])   . Paroxysmal atrial fibrillation ([place])    a. On Coumadin therapy/amidarone. b. 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  . Pulmonary embolism [facility])    Past Surgical History:  Procedure Laterality Date  . APPENDECTOMY    . ATRIAL FIBRILLATION ABLATION N/A 09/20/2018   Procedure: ATRIAL FIBRILLATION ABLATION;  Surgeon: Constance Haw, MD;  Location: [place] CV LAB;  Service: Cardiovascular;  Laterality: N/A;  . CARDIAC CATHETERIZATION  2011 & 2013   2013 L [place], LAD 100%, CFX 50%, RCA 80%, small vessel, med rx  . CORONARY ARTERY BYPASS GRAFT N/A 02/14/2014   Procedure: CORONARY ARTERY BYPASS GRAFTING (CABG)X4 LIMA-LAD; SVG-RCA(ENDARDERECTOMY); SVG-OM; SVG-DIAG;  Surgeon: Ivin Poot, MD;  Location: [place];  Service: Open Heart Surgery;  Laterality: N/A;  . FALSE ANEURYSM REPAIR Right 09/22/2018   Procedure: REPAIR FEMORAL PSEUDO ANEURYSM AND ARTERIOVENOUS FISTULA;  Surgeon: Elam Dutch, MD;  Location: [place];  Service: Vascular;  Laterality: Right;  . LEFT HEART CATHETERIZATION WITH CORONARY ANGIOGRAM N/A 02/08/2014   Procedure: LEFT HEART CATHETERIZATION WITH CORONARY ANGIOGRAM;  Surgeon: Blane Ohara, MD;  Location: [facility] CATH LAB;  Service: Cardiovascular;  Laterality: N/A;  . NM MYOVIEW LTD  2013   Scar in the anteroseptal/periapical distribution with moderate peri-infarct ischemia, EF 43%  . STERNAL INCISION RECLOSURE N/A 02/28/2014   Procedure: Irrigation  and Debridement of Sternum with Sternal Rewiring;  Surgeon: Kerin Perna, MD;  Location: [facility] OR;  Service: Thoracic;  Laterality: N/A;  . TEE WITHOUT CARDIOVERSION N/A 02/14/2014   Procedure: TRANSESOPHAGEAL ECHOCARDIOGRAM (TEE);  Surgeon: Kerin Perna, MD;  Location: [facility] OR;  Service: Open Heart Surgery;  Laterality: N/A;  . TOTAL KNEE ARTHROPLASTY  Bilateral      Current Outpatient Medications  Medication Sig Dispense Refill  . allopurinol (ZYLOPRIM) 100 MG tablet Take 100 mg by mouth 2 (two) times daily.    [name] aspirin EC 81 MG tablet Take 81 mg by mouth daily.    [name] atorvastatin (LIPITOR) 40 MG tablet Take 1 tablet (40 mg total) by mouth daily at 6 PM. 30 tablet 1  . calcium carbonate (TUMS - DOSED IN MG ELEMENTAL CALCIUM) 500 MG chewable tablet Chew 2 tablets by mouth as needed for indigestion or heartburn.     . carvedilol (COREG) 12.5 MG tablet TAKE ONE TABLET BY MOUTH TWICE DAILY. 180 tablet 3  . chlorthalidone (HYGROTON) 25 MG tablet TAKE ONE TABLET BY MOUTH DAILY. 90 tablet 3  . COLCRYS 0.6 MG tablet Take 0.6 mg by mouth as needed (gout).     [name] diltiazem (CARDIZEM) 30 MG tablet Take 15-30 mg by mouth 2 (two) times daily as needed (increased HR).     [name] ELIQUIS 5 MG TABS tablet TAKE ONE TABLET BY MOUTH TWICE DAILY. 60 tablet 5  . fluticasone (FLONASE) 50 MCG/ACT nasal spray Place 1 spray into both nostrils daily as needed for allergies or rhinitis.    [name] levothyroxine (SYNTHROID) 50 MCG tablet Take 50 mcg by mouth daily before breakfast.     . loratadine (CLARITIN) 10 MG tablet Take 10 mg by mouth daily.    . montelukast (SINGULAIR) 10 MG tablet Take 50 mg by mouth at bedtime.    . multivitamin (THERAGRAN) per tablet Take 1 tablet by mouth daily.      . Naphazoline HCl (CLEAR EYES OP) Place 1 drop into both eyes daily as needed (irritation).    . nitroGLYCERIN (NITROSTAT) 0.4 MG SL tablet Place 1 tablet (0.4 mg total) under the tongue every 5 (five) minutes x 3 doses as needed for chest pain. 25 tablet 2  . NON FORMULARY Take by mouth.     No current facility-administered medications for this visit.    Allergies:   Azithromycin and Oxycodone   Social History:  The patient  reports that he quit smoking about 53 years ago. His smoking use included cigarettes. He quit smokeless tobacco use about 12 years ago.  His smokeless tobacco  use included chew. He reports current alcohol use. He reports that he does not use drugs.   Family History:  The patient's family history includes Cancer in his mother; Coronary artery disease in his brother; Heart attack in his mother; Prostate cancer in his father.   ROS:  Please see the history of present illness.   Otherwise, review of systems is positive for none.   All other systems are reviewed and negative.   PHYSICAL EXAM: VS:  BP 124/82   Pulse 71   Ht 5\' 11"  (1.803 m)   Wt 229 lb (103.9 kg)   BMI 31.94 kg/m  , BMI Body mass index is 31.94 kg/m. GEN: Well nourished, well developed, in no acute distress  HEENT: normal  Neck: no JVD, carotid bruits, or masses Cardiac: RRR; no murmurs, rubs, or gallops,no edema  Respiratory:  clear to auscultation  bilaterally, normal work of breathing GI: soft, nontender, nondistended, + BS MS: no deformity or atrophy  Skin: warm and dry Neuro:  Strength and sensation are intact Psych: euthymic mood, full affect  EKG:  EKG is ordered today. Personal review of the ekg ordered shows sinus rhythm, rate71  Recent Labs: 01/19/2019: BUN 33; Creat 1.88; Hemoglobin 15.7; Platelets 221; Potassium 3.7; Sodium 138    Lipid Panel     Component Value Date/Time   CHOL 135 03/24/2017 1247   TRIG 94 03/24/2017 1247   HDL 41 03/24/2017 1247   CHOLHDL 3.3 03/24/2017 1247   CHOLHDL 3.3 04/01/2015 0739   VLDL 22 04/01/2015 0739   LDLCALC 75 03/24/2017 1247     Wt Readings from Last 3 Encounters:  06/05/19 229 lb (103.9 kg)  12/19/18 225 lb (102.1 kg)  10/19/18 219 lb (99.3 kg)      Other studies Reviewed: Additional studies/ records that were reviewed today include: TTE 07/13/18  Review of the above records today demonstrates:   1. The left ventricle has normal systolic function, with an ejection fraction of 55-60%. The cavity size was normal. There is mild concentric left ventricular hypertrophy. Left ventricular diastolic Doppler parameters  are consistent with impaired  relaxation. No evidence of left ventricular regional wall motion abnormalities.  2. The right ventricle has mildly reduced systolic function. The cavity was normal. There is mildly increased right ventricular wall thickness.  3. The mitral valve is degenerative. Mild thickening of the mitral valve leaflet. There is mild mitral annular calcification present.  4. The tricuspid valve is grossly normal.  5. The aortic valve is tricuspid. Mild thickening of the aortic valve. Moderate calcification of the aortic valve. Aortic valve regurgitation is moderate by color flow Doppler. Mild stenosis of the aortic valve.  6. Severe aortic annular calcification.  7. There is mild dilatation of the aortic root measuring 40 mm.   ASSESSMENT AND PLAN:  1.  Persistent atrial fibrillation: On carvedilol and Eliquis.  CHA2DS2-VASc of 4.  Post AF ablation 09/20/2018.  Had no further episodes of atrial fibrillation.  He continues to have soreness of his right leg from his AV fistula as a complication from his ablation.  Otherwise he has no complaints.   2.  Coronary artery disease: Status post CABG.  No current chest pain.  3.  Hypertension: Currently well controlled   Current medicines are reviewed at length with the patient today.   The patient does not have concerns regarding his medicines.  The following changes were made today: None  Labs/ tests ordered today include:  Orders Placed This Encounter  Procedures  . EKG 12-Lead    Disposition:   FU with Will Camnitz 6 months  Signed, Will Jorja Loa, MD  06/05/2019 10:04 AM     [facility] [address] [place] [address] [phone] (office) [phone] (fax)

## 2019-06-05 NOTE — Patient Instructions (Signed)
Medication Instructions:  Your physician recommends that you continue on your current medications as directed. Please refer to the Current Medication list given to you today.  *If you need a refill on your cardiac medications before your next appointment, please call your pharmacy*   Lab Work: None ordered If you have labs (blood work) drawn today and your tests are completely normal, you will receive your results only by: [name] MyChart Message (if you have MyChart) OR . A paper copy in the mail If you have any lab test that is abnormal or we need to change your treatment, we will call you to review the results.   Testing/Procedures: None ordered   Follow-Up: At [facility], you and your health needs are our priority.  As part of our continuing mission to provide you with exceptional heart care, we have created designated Provider Care Teams.  These Care Teams include your primary Cardiologist (physician) and Advanced Practice Providers (APPs -  Physician Assistants and Nurse Practitioners) who all work together to provide you with the care you need, when you need it.  We recommend signing up for the patient portal called "MyChart".  Sign up information is provided on this After Visit Summary.  MyChart is used to connect with patients for Virtual Visits (Telemedicine).  Patients are able to view lab/test results, encounter notes, upcoming appointments, etc.  Non-urgent messages can be sent to your provider as well.   To learn more about what you can do with MyChart, go to [email or web address].    Your next appointment:   6 month(s)  The format for your next appointment:   In Person  Provider:   You may see Will Jorja Loa, MD or one of the following Advanced Practice Providers on your designated Care Team:    Gypsy Balsam, NP  Francis Dowse, [place]  Casimiro Needle "Otilio Saber, [place]   Your physician wants you to follow-up in: 1 year with Dr. Elberta Fortis. You will receive a reminder  letter in the mail two months in advance. If you don't receive a letter, please call our office to schedule the follow-up appointment.   Thank you for choosing [facility]!!   Dory Horn, RN [phone]   Other Instructions

## 2019-06-12 ENCOUNTER — Other Ambulatory Visit: Payer: Self-pay

## 2019-06-12 MED ORDER — APIXABAN 5 MG PO TABS: 5.0000 mg | ORAL_TABLET | Freq: Two times a day (BID) | ORAL | 5 refills | Status: DC

## 2019-06-12 NOTE — Telephone Encounter (Signed)
Pt last saw Dr Elberta Fortis 06/05/19, Last labs 01/19/19 Creat 1.88, age 79, weight 103.9kg, based on specified criteria pt is on appropriate dosage of Eliquis 5mg  BID.  Will refill rx.

## 2019-06-12 NOTE — Telephone Encounter (Signed)
This is a [place] pt °

## 2019-06-15 DIAGNOSIS — E559 Vitamin D deficiency, unspecified: Secondary | ICD-10-CM | POA: Diagnosis not present

## 2019-06-15 DIAGNOSIS — I10 Essential (primary) hypertension: Secondary | ICD-10-CM | POA: Diagnosis not present

## 2019-06-15 DIAGNOSIS — N1832 Chronic kidney disease, stage 3b: Secondary | ICD-10-CM | POA: Diagnosis not present

## 2019-06-15 DIAGNOSIS — N200 Calculus of kidney: Secondary | ICD-10-CM | POA: Diagnosis not present

## 2019-06-15 DIAGNOSIS — I5032 Chronic diastolic (congestive) heart failure: Secondary | ICD-10-CM | POA: Diagnosis not present

## 2019-07-05 ENCOUNTER — Other Ambulatory Visit: Payer: Self-pay | Admitting: Family Medicine

## 2019-07-05 DIAGNOSIS — M109 Gout, unspecified: Secondary | ICD-10-CM

## 2019-07-19 ENCOUNTER — Encounter: Payer: Self-pay | Admitting: Cardiovascular Disease

## 2019-07-19 ENCOUNTER — Other Ambulatory Visit: Payer: Self-pay

## 2019-07-19 ENCOUNTER — Ambulatory Visit: Payer: Medicare Other | Admitting: Cardiovascular Disease

## 2019-07-19 ENCOUNTER — Ambulatory Visit (HOSPITAL_COMMUNITY): Payer: Medicare Other | Attending: Internal Medicine

## 2019-07-19 VITALS — BP 124/80 | HR 68 | Ht 71.0 in | Wt 230.8 lb

## 2019-07-19 DIAGNOSIS — I351 Nonrheumatic aortic (valve) insufficiency: Secondary | ICD-10-CM

## 2019-07-19 DIAGNOSIS — I4819 Other persistent atrial fibrillation: Secondary | ICD-10-CM | POA: Diagnosis not present

## 2019-07-19 DIAGNOSIS — I350 Nonrheumatic aortic (valve) stenosis: Secondary | ICD-10-CM

## 2019-07-19 DIAGNOSIS — E782 Mixed hyperlipidemia: Secondary | ICD-10-CM | POA: Diagnosis not present

## 2019-07-19 DIAGNOSIS — I2510 Atherosclerotic heart disease of native coronary artery without angina pectoris: Secondary | ICD-10-CM | POA: Diagnosis not present

## 2019-07-19 NOTE — Patient Instructions (Signed)

## 2019-07-19 NOTE — Progress Notes (Signed)
Cardiology Office Note:    Date:  07/19/2019   ID:  Gregory Davila, DOB Jan 01, 1941, MRN [id number]  PCP:  Suzan Slick, MD  [facility] Cardiologist:  Tonny Bollman, MD  [facility] Electrophysiologist:  Will Jorja Loa, MD   Referring MD: Suzan Slick, MD   Chief Complaint  Patient presents with  . Coronary Artery Disease    History of Present Illness:    Gregory Davila is a 79 y.o. male with a hx of coronary artery disease and atrial fibrillation, presenting for follow-up evaluation.  I saw him last in June 2020 and he is also followed closely with Dr. Elberta Fortis over the past year.  Cardiovascular problems include persistent atrial fibrillation, chronic systolic heart failure, hypertension, chronic kidney disease stage IIIb, and coronary artery disease status post multivessel CABG in 2016.  He underwent atrial fib ablation in September 2020.  He has had no recurrence of atrial fibrillation since that time.  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  The patient is here alone today.  He is not having any cardiac-related symptoms at present.  He denies chest pain, chest pressure, shortness of breath, orthopnea, PND, or heart palpitations.  He denies lightheadedness or syncope.  He has had some functional limitation related to his surgical repair in the right groin and is not able to lift his leg quite as well as he previously could.  He is not playing golf anymore.  Past Medical History:  Diagnosis Date  . Anginal pain (HCC)   . CAD (coronary artery disease)    a, 2011: occluded LAD with left to right collaterals and moderate disease in the right coronary artery and circumflex, EF 50%. b. NSTEMI 07/2011 in setting of AF-RVR, MV abnl, cath w/ LAD 100%, RCA mod-severe dz, small vessel, med rx  . Chronic renal insufficiency    followed by primary care physician  . Gout   .  Hypertension    long-standing  . Myocardial infarction (HCC)   . Paroxysmal atrial fibrillation (HCC)    a. On Coumadin therapy/amidarone. b. 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  . Pulmonary embolism [facility])     Past Surgical History:  Procedure Laterality Date  . APPENDECTOMY    . ATRIAL FIBRILLATION ABLATION N/A 09/20/2018   Procedure: ATRIAL FIBRILLATION ABLATION;  Surgeon: Regan Lemming, MD;  Location: [facility] INVASIVE CV LAB;  Service: Cardiovascular;  Laterality: N/A;  . CARDIAC CATHETERIZATION  2011 & 2013   2013 L [place], LAD 100%, CFX 50%, RCA 80%, small vessel, med rx  . CORONARY ARTERY BYPASS GRAFT N/A 02/14/2014   Procedure: CORONARY ARTERY BYPASS GRAFTING (CABG)X4 LIMA-LAD; SVG-RCA(ENDARDERECTOMY); SVG-OM; SVG-DIAG;  Surgeon: Kerin Perna, MD;  Location: [facility] OR;  Service: Open Heart Surgery;  Laterality: N/A;  . FALSE ANEURYSM REPAIR Right 09/22/2018   Procedure: REPAIR FEMORAL PSEUDO ANEURYSM AND ARTERIOVENOUS FISTULA;  Surgeon: Sherren Kerns, MD;  Location: [facility] OR;  Service: Vascular;  Laterality: Right;  . LEFT HEART CATHETERIZATION WITH CORONARY ANGIOGRAM N/A 02/08/2014   Procedure: LEFT HEART CATHETERIZATION WITH CORONARY ANGIOGRAM;  Surgeon: Micheline Chapman, MD;  Location[facility] CATH LAB;  Service: Cardiovascular;  Laterality: N/A;  . NM MYOVIEW LTD  2013   Scar in the anteroseptal/periapical distribution with moderate peri-infarct ischemia, EF 43%  . STERNAL INCISION RECLOSURE N/A 02/28/2014   Procedure: Irrigation and Debridement of Sternum  with Sternal Rewiring;  Surgeon: Kerin Perna, MD;  Location: [facility] OR;  Service: Thoracic;  Laterality: N/A;  . TEE WITHOUT CARDIOVERSION N/A 02/14/2014   Procedure: TRANSESOPHAGEAL ECHOCARDIOGRAM (TEE);  Surgeon: Kerin Perna, MD;  Location: [facility] OR;  Service: Open Heart Surgery;  Laterality: N/A;  . TOTAL KNEE ARTHROPLASTY Bilateral     Current Medications: Current Meds  Medication Sig  .  allopurinol (ZYLOPRIM) 100 MG tablet Take 100 mg by mouth 2 (two) times daily.  [name] apixaban (ELIQUIS) 5 MG TABS tablet Take 1 tablet (5 mg total) by mouth 2 (two) times daily.  [name] aspirin EC 81 MG tablet Take 81 mg by mouth daily.  [name] atorvastatin (LIPITOR) 40 MG tablet Take 1 tablet (40 mg total) by mouth daily at 6 PM.  . calcium carbonate (TUMS - DOSED IN MG ELEMENTAL CALCIUM) 500 MG chewable tablet Chew 2 tablets by mouth as needed for indigestion or heartburn.   . carvedilol (COREG) 12.5 MG tablet TAKE ONE TABLET BY MOUTH TWICE DAILY.  . chlorthalidone (HYGROTON) 25 MG tablet TAKE ONE TABLET BY MOUTH DAILY.  [name] COLCRYS 0.6 MG tablet Take 0.6 mg by mouth as needed (gout).   [name] diltiazem (CARDIZEM) 30 MG tablet Take 15-30 mg by mouth 2 (two) times daily as needed (increased HR).   . fluticasone (FLONASE) 50 MCG/ACT nasal spray Place 1 spray into both nostrils daily as needed for allergies or rhinitis.  [name] levothyroxine (SYNTHROID) 50 MCG tablet Take 50 mcg by mouth daily before breakfast.   . loratadine (CLARITIN) 10 MG tablet Take 10 mg by mouth daily.  . montelukast (SINGULAIR) 10 MG tablet Take 50 mg by mouth at bedtime.  . multivitamin (THERAGRAN) per tablet Take 1 tablet by mouth daily.    . Naphazoline HCl (CLEAR EYES OP) Place 1 drop into both eyes daily as needed (irritation).  . nitroGLYCERIN (NITROSTAT) 0.4 MG SL tablet Place 1 tablet (0.4 mg total) under the tongue every 5 (five) minutes x 3 doses as needed for chest pain.  . NON FORMULARY Take by mouth.     Allergies:   Azithromycin and Oxycodone   Social History   Socioeconomic History  . Marital status: Married    Spouse name: Not on file  . Number of children: Not on file  . Years of education: Not on file  . Highest education level: Not on file  Occupational History  . Occupation: Retired  Tobacco Use  . Smoking status: Former Smoker    Types: Cigarettes    Quit date: 01/04/1966    Years since quitting: 53.5  .  Smokeless tobacco: Former Neurosurgeon    Types: Chew    Quit date: 01/05/2007  . Tobacco comment: quit chewing tobacco about 4 yrs ago  Vaping Use  . Vaping Use: Never used  Substance and Sexual Activity  . Alcohol use: Yes    Comment: "seldomly drink beer" 1 every 2-3 weeks  . Drug use: No  . Sexual activity: Yes  Other Topics Concern  . Not on file  Social History Narrative  . Not on file   Social Determinants of Health   Financial Resource Strain:   . Difficulty of Paying Living Expenses:   Food Insecurity:   . Worried About Programme researcher, broadcasting/film/video in the Last Year:   . Barista in the Last Year:   Transportation Needs:   . Freight forwarder (Medical):   [name] Lack of Transportation (Non-Medical):  Physical Activity:   . Days of Exercise per Week:   . Minutes of Exercise per Session:   Stress:   . Feeling of Stress :   Social Connections:   . Frequency of Communication with Friends and Family:   . Frequency of Social Gatherings with Friends and Family:   . Attends Religious Services:   . Active Member of Clubs or Organizations:   . Attends Banker Meetings:   [name] Marital Status:      Family History: The patient's family history includes Cancer in his mother; Coronary artery disease in his brother; Heart attack in his mother; Prostate cancer in his father.  ROS:   Please see the history of present illness.    All other systems reviewed and are negative.  EKGs/Labs/Other Studies Reviewed:    The following studies were reviewed today: The patient's 2D echocardiogram is reviewed.  The formal interpretation is pending as the study was just done this morning prior to his office visit.  Overall LV systolic function appears preserved with a wall motion abnormality in the distal anteroseptum.  The patient has degenerative changes of the aortic valve with calcified valve leaflets and mild aortic stenosis and insufficiency.  RV function appears normal.  There is mild  mitral and tricuspid regurgitation.  EKG:  EKG is not ordered today.   Recent Labs: 01/19/2019: BUN 33; Creat 1.88; Hemoglobin 15.7; Platelets 221; Potassium 3.7; Sodium 138  Recent Lipid Panel    Component Value Date/Time   CHOL 135 03/24/2017 1247   TRIG 94 03/24/2017 1247   HDL 41 03/24/2017 1247   CHOLHDL 3.3 03/24/2017 1247   CHOLHDL 3.3 04/01/2015 0739   VLDL 22 04/01/2015 0739   LDLCALC 75 03/24/2017 1247    Physical Exam:    VS:  BP 124/80   Pulse 68   Ht 5\' 11"  (1.803 m)   Wt 230 lb 12.8 oz (104.7 kg)   SpO2 95%   BMI 32.19 kg/m     Wt Readings from Last 3 Encounters:  07/19/19 230 lb 12.8 oz (104.7 kg)  06/05/19 229 lb (103.9 kg)  12/19/18 225 lb (102.1 kg)     GEN:  Well nourished, well developed in no acute distress HEENT: Normal NECK: No JVD; No carotid bruits LYMPHATICS: No lymphadenopathy CARDIAC: RRR, 2/6 systolic ejection murmur at the right upper sternal border, no diastolic murmur RESPIRATORY:  Clear to auscultation without rales, wheezing or rhonchi  ABDOMEN: Soft, non-tender, non-distended MUSCULOSKELETAL:  No edema; No deformity  SKIN: Warm and dry NEUROLOGIC:  Alert and oriented x 3 PSYCHIATRIC:  Normal affect   ASSESSMENT:    1. Persistent atrial fibrillation (HCC)   2. Coronary artery disease involving native coronary artery of native heart without angina pectoris   3. Mild aortic stenosis   4. Mixed hyperlipidemia    PLAN:    In order of problems listed above:  1. Maintaining sinus rhythm following AF ablation.  Anticoagulated with apixaban, denies bleeding problems. 2. Stable on aspirin 81 mg daily, carvedilol, diltiazem, and atorvastatin. 3. I personally reviewed his echo images today.  He appears to have stable, mild aortic stenosis and insufficiency.  No changes are recommended.  I will see him back in 1 year for follow-up evaluation.  Will review formal echo interpretation when available. 4. Treated with atorvastatin 40 mg  daily.  Lipids are followed by his PCP.  I reviewed his most recent labs in our system.   Medication Adjustments/Labs and Tests Ordered:  Current medicines are reviewed at length with the patient today.  Concerns regarding medicines are outlined above.  No orders of the defined types were placed in this encounter.  No orders of the defined types were placed in this encounter.   Patient Instructions  Medication Instructions:  Your provider recommends that you continue on your current medications as directed. Please refer to the Current Medication list given to you today.   *If you need a refill on your cardiac medications before your next appointment, please call your pharmacy*  Follow-Up: [facility], you and your health needs are our priority.  As part of our continuing mission to provide you with exceptional heart care, we have created designated Provider Care Teams.  These Care Teams include your primary Cardiologist (physician) and Advanced Practice Providers (APPs -  Physician Assistants and Nurse Practitioners) who all work together to provide you with the care you need, when you need it. Your next appointment:   12 month(s) The format for your next appointment:   In Person Provider:   You may see Tonny BollmanMichael Christianna Belmonte, MD or one of the following Advanced Practice Providers on your designated Care Team:    Tereso NewcomerScott Weaver, PA-C  Chelsea AusVin Bhagat, [place]C      Signed, Tonny BollmanMichael Cortlyn Cannell, MD  07/19/2019 12:35 PM    [facility]

## 2019-07-24 ENCOUNTER — Ambulatory Visit (INDEPENDENT_AMBULATORY_CARE_PROVIDER_SITE_OTHER): Payer: Medicare Other | Admitting: *Deleted

## 2019-07-24 DIAGNOSIS — I4891 Unspecified atrial fibrillation: Secondary | ICD-10-CM | POA: Diagnosis not present

## 2019-07-24 DIAGNOSIS — I4819 Other persistent atrial fibrillation: Secondary | ICD-10-CM

## 2019-07-24 DIAGNOSIS — Z5181 Encounter for therapeutic drug level monitoring: Secondary | ICD-10-CM

## 2019-07-24 NOTE — Progress Notes (Signed)
Pt requested to change from Warfarin to Eliquis. Pt took last dose of warfarin on 05/22/18 and started Eliquis5mg  twice dailyfor PAFon 05/24/18.  Pt does not have any complaints taking Eliquis.  Denies bleeding, excessive bruising or GI upset.  Reviewed patients medication list. Pt is notcurrently on any combined P-gp and strong CYP3A4 inhibitors/inducers (ketoconazole, traconazole, ritonavir, carbamazepine, phenytoin, rifampin, St. John's wort). Reviewed labs from [facility] on 07/27/19:SCr 1.55, Weight101.1kg CrCl 56.11. Dose isappropriate based on age, weight, and SCr. Hgb and HCT: 16.6 /49.1  Plts: 244   A full discussion of the nature of anticoagulants has been carried out. A benefit/risk analysis has been presented to the patient, so that they understand the justification for choosing anticoagulation with Eliquis at this time. The need for compliance is stressed. Pt is aware to take the medication twice daily. Side effects of potential bleeding are discussed, including unusual colored urine or stools, coughing up blood or coffee ground emesis, nose bleeds or serious fall or head trauma. Discussed signs and symptoms of stroke. The patient should avoid any OTC items containing aspirin or ibuprofen. Avoid alcohol consumption. Call if any signs of abnormal bleeding. Discussed financial obligations and resolved any difficulty in obtaining medication. Next lab test in 6 months.   Left message on cell phone with lab results.  Labs have improved since 6 months ago.  Will continue current dose of Eliquis and repeat labs and Follow up appt in 6 months.  Will put in recall.

## 2019-07-27 DIAGNOSIS — Z5181 Encounter for therapeutic drug level monitoring: Secondary | ICD-10-CM | POA: Diagnosis not present

## 2019-07-27 DIAGNOSIS — I4819 Other persistent atrial fibrillation: Secondary | ICD-10-CM | POA: Diagnosis not present

## 2019-07-27 LAB — CBC
HCT: 49.1 % (ref 38.5–50.0)
Hemoglobin: 16.6 g/dL (ref 13.2–17.1)
MCH: 31.8 pg (ref 27.0–33.0)
MCHC: 33.8 g/dL (ref 32.0–36.0)
MCV: 94.1 fL (ref 80.0–100.0)
MPV: 10.7 fL (ref 7.5–12.5)
Platelets: 244 10*3/uL (ref 140–400)
RBC: 5.22 10*6/uL (ref 4.20–5.80)
RDW: 13.6 % (ref 11.0–15.0)
WBC: 7.8 10*3/uL (ref 3.8–10.8)

## 2019-07-27 LAB — BASIC METABOLIC PANEL
BUN/Creatinine Ratio: 17 (calc) (ref 6–22)
BUN: 27 mg/dL — ABNORMAL HIGH (ref 7–25)
CO2: 26 mmol/L (ref 20–32)
Calcium: 9.9 mg/dL (ref 8.6–10.3)
Chloride: 102 mmol/L (ref 98–110)
Creat: 1.55 mg/dL — ABNORMAL HIGH (ref 0.70–1.18)
Glucose, Bld: 184 mg/dL — ABNORMAL HIGH (ref 65–99)
Potassium: 3.9 mmol/L (ref 3.5–5.3)
Sodium: 136 mmol/L (ref 135–146)

## 2019-08-09 DIAGNOSIS — N200 Calculus of kidney: Secondary | ICD-10-CM | POA: Diagnosis not present

## 2019-08-09 DIAGNOSIS — N1832 Chronic kidney disease, stage 3b: Secondary | ICD-10-CM | POA: Diagnosis not present

## 2019-08-09 DIAGNOSIS — E559 Vitamin D deficiency, unspecified: Secondary | ICD-10-CM | POA: Diagnosis not present

## 2019-08-09 DIAGNOSIS — I10 Essential (primary) hypertension: Secondary | ICD-10-CM | POA: Diagnosis not present

## 2019-08-09 DIAGNOSIS — I5032 Chronic diastolic (congestive) heart failure: Secondary | ICD-10-CM | POA: Diagnosis not present

## 2019-08-17 DIAGNOSIS — I10 Essential (primary) hypertension: Secondary | ICD-10-CM | POA: Diagnosis not present

## 2019-08-17 DIAGNOSIS — I5032 Chronic diastolic (congestive) heart failure: Secondary | ICD-10-CM | POA: Diagnosis not present

## 2019-08-17 DIAGNOSIS — E559 Vitamin D deficiency, unspecified: Secondary | ICD-10-CM | POA: Diagnosis not present

## 2019-08-17 DIAGNOSIS — N1832 Chronic kidney disease, stage 3b: Secondary | ICD-10-CM | POA: Diagnosis not present

## 2019-08-17 DIAGNOSIS — R7303 Prediabetes: Secondary | ICD-10-CM | POA: Diagnosis not present

## 2019-09-17 ENCOUNTER — Telehealth: Payer: Self-pay | Admitting: Cardiovascular Disease

## 2019-09-17 DIAGNOSIS — R7303 Prediabetes: Secondary | ICD-10-CM | POA: Diagnosis not present

## 2019-09-17 DIAGNOSIS — N1831 Chronic kidney disease, stage 3a: Secondary | ICD-10-CM | POA: Diagnosis not present

## 2019-09-17 NOTE — Telephone Encounter (Signed)
  New message:     Patient calling asking can he start back taking Wafrin instead Eliquis. Please call patient back.

## 2019-09-17 NOTE — Telephone Encounter (Signed)
Spoke with patient, he will resume warfarin tonight. He will overlap with the Eliquis he has left. His PE was provoked, therefore do not feel a bridge is needed. Will resume at previous dose of 5mg  daily. However, he may need higher dose as he is not on amiodarone anymore.

## 2019-09-17 NOTE — Telephone Encounter (Signed)
Mr. Fiallos has 3 tablets of Eliquis left and is requesting to switch back to Warfarin when he runs out due to cost. He is spending over $150 monthly and cannot afford it. He seems relatively uninterested in completing a PA unless it will be as cheap as Warfarin. He expects a call back today to discuss since his meds will be out tomorrow.

## 2019-09-21 ENCOUNTER — Ambulatory Visit (INDEPENDENT_AMBULATORY_CARE_PROVIDER_SITE_OTHER): Payer: Medicare Other | Admitting: *Deleted

## 2019-09-21 DIAGNOSIS — I4819 Other persistent atrial fibrillation: Secondary | ICD-10-CM

## 2019-09-21 DIAGNOSIS — Z5181 Encounter for therapeutic drug level monitoring: Secondary | ICD-10-CM | POA: Diagnosis not present

## 2019-09-21 LAB — POCT INR: INR: 1.1 — AB (ref 2.0–3.0)

## 2019-09-21 MED ORDER — WARFARIN SODIUM 5 MG PO TABS: ORAL_TABLET | ORAL | 4 refills | Status: DC

## 2019-09-21 NOTE — Patient Instructions (Signed)
Take 2 tablets tonight then increase dose to 1 1/2 tablets daily except 1 tablet on Mondays, Wednesdays and Fridays Recheck in 1 week

## 2019-09-27 ENCOUNTER — Ambulatory Visit (INDEPENDENT_AMBULATORY_CARE_PROVIDER_SITE_OTHER): Payer: Medicare Other | Admitting: *Deleted

## 2019-09-27 DIAGNOSIS — I4819 Other persistent atrial fibrillation: Secondary | ICD-10-CM | POA: Diagnosis not present

## 2019-09-27 DIAGNOSIS — Z5181 Encounter for therapeutic drug level monitoring: Secondary | ICD-10-CM

## 2019-09-27 LAB — POCT INR: INR: 1.8 — AB (ref 2.0–3.0)

## 2019-09-27 NOTE — Patient Instructions (Signed)
Continue warfarin 1 1/2 tablets daily except 1 tablet on Mondays, Wednesdays and Fridays Recheck in 1 week

## 2019-10-05 ENCOUNTER — Ambulatory Visit (INDEPENDENT_AMBULATORY_CARE_PROVIDER_SITE_OTHER): Payer: Medicare Other | Admitting: *Deleted

## 2019-10-05 DIAGNOSIS — I4819 Other persistent atrial fibrillation: Secondary | ICD-10-CM | POA: Diagnosis not present

## 2019-10-05 DIAGNOSIS — Z5181 Encounter for therapeutic drug level monitoring: Secondary | ICD-10-CM

## 2019-10-05 LAB — POCT INR: INR: 3.2 — AB (ref 2.0–3.0)

## 2019-10-05 NOTE — Patient Instructions (Signed)
Hold warfarin tonight then decrease dose to 1 tablet daily except 1 1/2 tablets on Tuesdays and Saturdays Recheck in 2 weeks

## 2019-10-17 ENCOUNTER — Ambulatory Visit (INDEPENDENT_AMBULATORY_CARE_PROVIDER_SITE_OTHER): Payer: Medicare Other | Admitting: *Deleted

## 2019-10-17 DIAGNOSIS — I4819 Other persistent atrial fibrillation: Secondary | ICD-10-CM

## 2019-10-17 DIAGNOSIS — Z5181 Encounter for therapeutic drug level monitoring: Secondary | ICD-10-CM

## 2019-10-17 LAB — POCT INR: INR: 1.8 — AB (ref 2.0–3.0)

## 2019-10-17 NOTE — Patient Instructions (Signed)
Increase warfarin to 1 1/2 tablets daily except 1 tablet on Sundays, Tuesdays and Thursdays Recheck in 2 weeks 

## 2019-10-22 DIAGNOSIS — E559 Vitamin D deficiency, unspecified: Secondary | ICD-10-CM | POA: Diagnosis not present

## 2019-10-22 DIAGNOSIS — I5032 Chronic diastolic (congestive) heart failure: Secondary | ICD-10-CM | POA: Diagnosis not present

## 2019-10-22 DIAGNOSIS — I10 Essential (primary) hypertension: Secondary | ICD-10-CM | POA: Diagnosis not present

## 2019-10-22 DIAGNOSIS — N1832 Chronic kidney disease, stage 3b: Secondary | ICD-10-CM | POA: Diagnosis not present

## 2019-10-26 DIAGNOSIS — E559 Vitamin D deficiency, unspecified: Secondary | ICD-10-CM | POA: Diagnosis not present

## 2019-10-26 DIAGNOSIS — N1832 Chronic kidney disease, stage 3b: Secondary | ICD-10-CM | POA: Diagnosis not present

## 2019-10-26 DIAGNOSIS — I5032 Chronic diastolic (congestive) heart failure: Secondary | ICD-10-CM | POA: Diagnosis not present

## 2019-10-26 DIAGNOSIS — R7303 Prediabetes: Secondary | ICD-10-CM | POA: Diagnosis not present

## 2019-10-26 DIAGNOSIS — I10 Essential (primary) hypertension: Secondary | ICD-10-CM | POA: Diagnosis not present

## 2019-11-01 ENCOUNTER — Ambulatory Visit (INDEPENDENT_AMBULATORY_CARE_PROVIDER_SITE_OTHER): Payer: Medicare Other | Admitting: *Deleted

## 2019-11-01 DIAGNOSIS — Z5181 Encounter for therapeutic drug level monitoring: Secondary | ICD-10-CM | POA: Diagnosis not present

## 2019-11-01 DIAGNOSIS — I4819 Other persistent atrial fibrillation: Secondary | ICD-10-CM | POA: Diagnosis not present

## 2019-11-01 LAB — POCT INR: INR: 2.9 (ref 2.0–3.0)

## 2019-11-01 NOTE — Patient Instructions (Signed)
Continue warfarin 1 1/2 tablets daily except 1 tablet on Sundays, Tuesdays and Thursdays Recheck in 3 weeks 

## 2019-12-03 ENCOUNTER — Ambulatory Visit (INDEPENDENT_AMBULATORY_CARE_PROVIDER_SITE_OTHER): Payer: Medicare Other | Admitting: *Deleted

## 2019-12-03 ENCOUNTER — Other Ambulatory Visit: Payer: Self-pay

## 2019-12-03 DIAGNOSIS — I4819 Other persistent atrial fibrillation: Secondary | ICD-10-CM | POA: Diagnosis not present

## 2019-12-03 DIAGNOSIS — Z5181 Encounter for therapeutic drug level monitoring: Secondary | ICD-10-CM | POA: Diagnosis not present

## 2019-12-03 LAB — POCT INR: INR: 2.6 (ref 2.0–3.0)

## 2019-12-03 NOTE — Patient Instructions (Signed)
Continue warfarin 1 1/2 tablets daily except 1 tablet on Sundays, Tuesdays and Thursdays.  Recheck in 5 weeks. 

## 2020-01-07 ENCOUNTER — Ambulatory Visit (INDEPENDENT_AMBULATORY_CARE_PROVIDER_SITE_OTHER): Payer: Medicare Other | Admitting: *Deleted

## 2020-01-07 ENCOUNTER — Other Ambulatory Visit: Payer: Self-pay

## 2020-01-07 DIAGNOSIS — I4819 Other persistent atrial fibrillation: Secondary | ICD-10-CM

## 2020-01-07 DIAGNOSIS — Z5181 Encounter for therapeutic drug level monitoring: Secondary | ICD-10-CM

## 2020-01-07 LAB — POCT INR: INR: 1.6 — AB (ref 2.0–3.0)

## 2020-01-07 NOTE — Patient Instructions (Signed)
Take 2 tablets tonight and tomorrow night then resume 1 1/2 tablets daily except 1 tablet on Sundays, Tuesdays and Thursdays Recheck in 2 weeks

## 2020-01-23 ENCOUNTER — Ambulatory Visit (INDEPENDENT_AMBULATORY_CARE_PROVIDER_SITE_OTHER): Payer: Medicare Other | Admitting: *Deleted

## 2020-01-23 DIAGNOSIS — I4819 Other persistent atrial fibrillation: Secondary | ICD-10-CM

## 2020-01-23 DIAGNOSIS — Z5181 Encounter for therapeutic drug level monitoring: Secondary | ICD-10-CM

## 2020-01-23 LAB — POCT INR: INR: 1.9 — AB (ref 2.0–3.0)

## 2020-01-23 NOTE — Patient Instructions (Signed)
Increase warfarin to 1 1/2 tablets daily except 1 tablet on Tuesdays and Fridays Recheck in 3 weeks

## 2020-02-11 ENCOUNTER — Ambulatory Visit (INDEPENDENT_AMBULATORY_CARE_PROVIDER_SITE_OTHER): Payer: Medicare Other | Admitting: *Deleted

## 2020-02-11 DIAGNOSIS — Z5181 Encounter for therapeutic drug level monitoring: Secondary | ICD-10-CM

## 2020-02-11 DIAGNOSIS — I4819 Other persistent atrial fibrillation: Secondary | ICD-10-CM | POA: Diagnosis not present

## 2020-02-11 LAB — POCT INR: INR: 2.7 (ref 2.0–3.0)

## 2020-02-11 NOTE — Patient Instructions (Signed)
Continue warfarin 1 1/2 tablets daily except 1 tablet on Tuesdays and Fridays Recheck in 4 weeks 

## 2020-02-28 ENCOUNTER — Other Ambulatory Visit: Payer: Self-pay

## 2020-02-28 ENCOUNTER — Emergency Department (HOSPITAL_COMMUNITY): Payer: Medicare Other

## 2020-02-28 ENCOUNTER — Emergency Department (HOSPITAL_COMMUNITY)
Admission: EM | Admit: 2020-02-28 | Discharge: 2020-02-28 | Disposition: A | Discharge status: Home / Self Care | Payer: Medicare Other | Attending: Emergency Medicine | Admitting: Emergency Medicine

## 2020-02-28 ENCOUNTER — Encounter (HOSPITAL_COMMUNITY): Payer: Self-pay | Admitting: Emergency Medicine

## 2020-02-28 DIAGNOSIS — I5042 Chronic combined systolic (congestive) and diastolic (congestive) heart failure: Secondary | ICD-10-CM | POA: Insufficient documentation

## 2020-02-28 DIAGNOSIS — M1811 Unilateral primary osteoarthritis of first carpometacarpal joint, right hand: Secondary | ICD-10-CM | POA: Diagnosis not present

## 2020-02-28 DIAGNOSIS — N183 Chronic kidney disease, stage 3 unspecified: Secondary | ICD-10-CM | POA: Diagnosis not present

## 2020-02-28 DIAGNOSIS — Z96653 Presence of artificial knee joint, bilateral: Secondary | ICD-10-CM | POA: Insufficient documentation

## 2020-02-28 DIAGNOSIS — I2510 Atherosclerotic heart disease of native coronary artery without angina pectoris: Secondary | ICD-10-CM | POA: Insufficient documentation

## 2020-02-28 DIAGNOSIS — I130 Hypertensive heart and chronic kidney disease with heart failure and stage 1 through stage 4 chronic kidney disease, or unspecified chronic kidney disease: Secondary | ICD-10-CM | POA: Diagnosis not present

## 2020-02-28 DIAGNOSIS — I252 Old myocardial infarction: Secondary | ICD-10-CM | POA: Insufficient documentation

## 2020-02-28 DIAGNOSIS — Z7982 Long term (current) use of aspirin: Secondary | ICD-10-CM | POA: Diagnosis not present

## 2020-02-28 DIAGNOSIS — Z87891 Personal history of nicotine dependence: Secondary | ICD-10-CM | POA: Insufficient documentation

## 2020-02-28 DIAGNOSIS — R202 Paresthesia of skin: Secondary | ICD-10-CM | POA: Diagnosis present

## 2020-02-28 DIAGNOSIS — Z79899 Other long term (current) drug therapy: Secondary | ICD-10-CM | POA: Diagnosis not present

## 2020-02-28 DIAGNOSIS — Z951 Presence of aortocoronary bypass graft: Secondary | ICD-10-CM | POA: Diagnosis not present

## 2020-02-28 NOTE — ED Triage Notes (Signed)
Pt states he woke up around 0830 with tingling in his right thumb, decreased sensation and limited ROM. Pt denies any other neurological symptoms.

## 2020-02-28 NOTE — ED Provider Notes (Signed)
[facility] EMERGENCY DEPARTMENT Provider Note   CSN: [id number] Arrival date & time: 02/28/20  1035     History Chief Complaint  Patient presents with  . Numbness    (right thumb)    Gregory Davila is a 80 y.o. male.  Patient woke this morning with his right thumb not able to fully extend at the base. Patient states it was fine when he went to bed. He also got up in the melanite it was fine then. Patient known to have a history of arthritis. Patient said there was also some numbness in the thumb. But there was no pain. All the other fingers working perfectly normal. No other extremity problems no visual problems no speech problems. No headache. Patient clinically worried about stroke. 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  Patient is right-hand dominant.        Past Medical History:  Diagnosis Date  . Anginal pain (HCC)   . CAD (coronary artery disease)    a, 2011: occluded LAD with left to right collaterals and moderate disease in the right coronary artery and circumflex, EF 50%. b. NSTEMI 07/2011 in setting of AF-RVR, MV abnl, cath w/ LAD 100%, RCA mod-severe dz, small vessel, med rx  . Chronic renal insufficiency    followed by primary care physician  . Gout   . Hypertension    long-standing  . Myocardial infarction (HCC)   . Paroxysmal atrial fibrillation (HCC)    a. On Coumadin therapy/amidarone. b. 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  . Pulmonary embolism [facility])     Patient Active Problem List   Diagnosis Date Noted  . Pseudoaneurysm following procedure (HCC) 09/21/2018  . CKD (chronic kidney disease) stage 3, GFR 30-59 ml/min (HCC) 07/21/2018  . Neck pain 07/21/2018  . Atherosclerotic heart disease of native coronary artery without angina pectoris 07/21/2018  . Gout 02/16/2017  . History of pulmonary embolism 02/16/2017  . Essential hypertension  02/16/2017  . Chronic combined systolic and diastolic CHF (congestive heart failure) (HCC) 01/27/2016  . Pure hypercholesterolemia 01/27/2016  . 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 History of non-ST elevation myocardial infarction (NSTEMI) 07/13/2011  . Atrial fibrillation with RVR (HCC) 07/12/2011  . Persistent atrial fibrillation (HCC)   . Coronary artery disease involving native coronary artery of native heart without angina pectoris   . Hypertension   . Renal insufficiency   . Pulmonary embolism (HCC)   . History of amiodarone therapy 07/27/2010  . Long term (current) use of anticoagulants 04/13/2010  . EDEMA 11/20/2009  . Chronic rhinitis 02/04/2009  . GERD (gastroesophageal reflux disease) 11/05/2006    Past Surgical History:  Procedure Laterality Date  . APPENDECTOMY    . ATRIAL FIBRILLATION ABLATION N/A 09/20/2018   Procedure: ATRIAL FIBRILLATION ABLATION;  Surgeon: Regan Lemming, MD;  Location: [facility] INVASIVE CV LAB;  Service: Cardiovascular;  Laterality: N/A;  . CARDIAC CATHETERIZATION  2011 & 2013   2013 [place], LAD 100%, CFX 50%, RCA 80%, small vessel, med rx  . CORONARY ARTERY BYPASS GRAFT N/A 02/14/2014   Procedure: CORONARY ARTERY BYPASS GRAFTING (CABG)X4 LIMA-LAD; SVG-RCA(ENDARDERECTOMY); SVG-OM; SVG-DIAG;  Surgeon: Kerin Perna, MD;  Location: [facility] OR;  Service: Open Heart Surgery;  Laterality: N/A;  . FALSE ANEURYSM REPAIR Right 09/22/2018   Procedure: REPAIR FEMORAL PSEUDO ANEURYSM AND ARTERIOVENOUS FISTULA;  Surgeon: Sherren Kerns, MD;  Location: [facility] OR;  Service: Vascular;  Laterality: Right;  . LEFT HEART CATHETERIZATION WITH CORONARY ANGIOGRAM N/A 02/08/2014   Procedure: LEFT HEART CATHETERIZATION WITH CORONARY ANGIOGRAM;  Surgeon: Micheline ChapmanMichael D Cooper, MD;  [facility];  Service: Cardiovascular;  Laterality: N/A;  . NM MYOVIEW LTD  2013   Scar in the anteroseptal/periapical distribution with moderate peri-infarct ischemia, EF 43%  . STERNAL INCISION  RECLOSURE N/A 02/28/2014   Procedure: Irrigation and Debridement of Sternum with Sternal Rewiring;  Surgeon: Kerin PernaPeter Van Trigt, MD;  [facility];  Service: Thoracic;  Laterality: N/A;  . TEE WITHOUT CARDIOVERSION N/A 02/14/2014   Procedure: TRANSESOPHAGEAL ECHOCARDIOGRAM (TEE);  Surgeon: Kerin PernaPeter Van Trigt, MD;  [facility];  Service: Open Heart Surgery;  Laterality: N/A;  . TOTAL KNEE ARTHROPLASTY Bilateral        Family History  Problem Relation Age of Onset  . Heart attack Mother   . Cancer Mother   . Prostate cancer Father   . Coronary artery disease Brother        MI    Social History   Tobacco Use  . Smoking status: Former Smoker    Types: Cigarettes    Quit date: 01/04/1966    Years since quitting: 54.1  . Smokeless tobacco: Former NeurosurgeonUser    Types: Chew    Quit date: 01/05/2007  . Tobacco comment: quit chewing tobacco about 4 yrs ago  Vaping Use  . Vaping Use: Never used  Substance Use Topics  . Alcohol use: Yes    Comment: "seldomly drink beer" 1 every 2-3 weeks  . Drug use: No    Home Medications Prior to Admission medications   Medication Sig Start Date End Date Taking? Authorizing Provider  allopurinol (ZYLOPRIM) 100 MG tablet Take 100 mg by mouth 2 (two) times daily. 05/08/19   [provider]  aspirin EC 81 MG tablet Take 81 mg by mouth daily.    [provider]  atorvastatin (LIPITOR) 40 MG tablet Take 1 tablet (40 mg total) by mouth daily at 6 PM. 03/05/14   Thomasena Edisollins, Almira CoasterGina L, PA-C  calcium carbonate (TUMS - DOSED IN MG ELEMENTAL CALCIUM) 500 MG chewable tablet Chew 2 tablets by mouth as needed for indigestion or heartburn.     [provider]  carvedilol (COREG) 12.5 MG tablet TAKE ONE TABLET BY MOUTH TWICE DAILY. 04/10/19   Camnitz, Andree CossWill Martin, MD  chlorthalidone (HYGROTON) 25 MG tablet TAKE ONE TABLET BY MOUTH DAILY. 04/10/19   Camnitz, Andree CossWill Martin, MD  COLCRYS 0.6 MG tablet Take 0.6 mg by mouth as needed (gout).  11/24/10   [provider]  diltiazem (CARDIZEM) 30 MG tablet Take 15-30 mg by mouth 2 (two) times daily as needed (increased HR).     [provider]  fluticasone (FLONASE) 50 MCG/ACT nasal spray Place 1 spray into both nostrils daily as needed for allergies or rhinitis.    [provider]  levothyroxine (SYNTHROID) 50 MCG tablet Take 50 mcg by mouth daily before breakfast.  08/30/18 08/30/19  [provider]  loratadine (CLARITIN) 10 MG tablet Take 10 mg by mouth daily.    [provider]  montelukast (SINGULAIR) 10 MG tablet Take 50 mg by mouth at bedtime. 05/09/19   [provider]  multivitamin [facility](THERAGRAN) per tablet Take 1 tablet by mouth daily.      [provider]  Naphazoline HCl (CLEAR EYES OP) Place 1 drop into both eyes daily as needed (irritation).    [provider]  nitroGLYCERIN (NITROSTAT) 0.4 MG SL tablet Place 1 tablet (0.4 mg total) under the tongue every 5 (five) minutes x 3 doses as needed for chest pain. 04/30/13   Tonny Bollman, MD  NON FORMULARY Take by mouth.    [provider]  warfarin (COUMADIN) 5 MG tablet Take 1 - 2 tablets daily or as directed 09/21/19   Tonny Bollman, MD    Allergies    Azithromycin and Oxycodone  Review of Systems   Review of Systems  Constitutional: Negative for chills and fever.  HENT: Negative for rhinorrhea and sore throat.   Eyes: Negative for visual disturbance.  Respiratory: Negative for cough and shortness of breath.   Cardiovascular: Negative for chest pain and leg swelling.  Gastrointestinal: Negative for abdominal pain, diarrhea, nausea and vomiting.  Genitourinary: Negative for dysuria.  Musculoskeletal: Negative for back pain and neck pain.  Skin: Negative for rash.  Neurological: Positive for numbness. Negative for dizziness, light-headedness and headaches.  Hematological: Does not bruise/bleed easily.  Psychiatric/Behavioral: Negative for confusion.    Physical  Exam Updated Vital Signs BP 137/81 (BP Location: Right Arm)   Pulse 87   Temp 97.9 F (36.6 C) (Oral)   Resp 18   Ht 1.778 m (5\' 10" )   Wt 95.3 kg   SpO2 96%   BMI 30.13 kg/m   Physical Exam Vitals and nursing note reviewed.  Constitutional:      General: He is not in acute distress.    Appearance: He is well-developed and well-nourished.  HENT:     Head: Normocephalic and atraumatic.  Eyes:     Extraocular Movements: Extraocular movements intact.     Conjunctiva/sclera: Conjunctivae normal.  Cardiovascular:     Rate and Rhythm: Normal rate and regular rhythm.     Heart sounds: No murmur heard.   Pulmonary:     Effort: Pulmonary effort is normal. No respiratory distress.     Breath sounds: Normal breath sounds.  Abdominal:     Palpations: Abdomen is soft.     Tenderness: There is no abdominal tenderness.  Musculoskeletal:        General: Deformity present. No swelling, tenderness or edema.     Cervical back: Neck supple.     Comments: Patient with nontender swelling to the base of the thumb. On the right side. Patient has extension at the distal joint. But limited extension at the base. Questionable subluxation or perhaps dislocation. But able to passively move it well. Good cap refill to all of the fingers. Radial pulses 1+. Excellent extension of the fingers and flexion of the fingers and opposition of the thumb works well. Strength seems to be fine. And all the fingers and thumb. No notable numbness to the right thumb. No numbness anywhere else in the hand.  Skin:    General: Skin is warm and dry.     Capillary Refill: Capillary refill takes less than 2 seconds.  Neurological:     General: No focal deficit present.     Mental Status: He is alert and oriented to person, place, and time.     Cranial Nerves: No cranial nerve deficit.     Sensory: No sensory deficit.     Motor: No weakness.  Psychiatric:        Mood and Affect: Mood and affect normal.     ED Results  / Procedures / Treatments   Labs (all labs ordered are listed, but only abnormal results are displayed) Labs Reviewed -  No data to display  EKG None  Radiology No results found.  Procedures Procedures   Medications Ordered in ED Medications - No data to display  ED Course  I have reviewed the triage vital signs and the nursing notes.  Pertinent labs & imaging results that were available during my care of the patient were reviewed by me and considered in my medical decision making (see chart for details).    MDM Rules/Calculators/A&P                         X-ray of the thumb shows extensive arthritis at the base. But no subluxation or dislocation. Patient's thumb is now working better for it. I feel that this is all secondary to arthritis. No clinical concerns for stroke since things were so isolated. Will have him try a thumb spica splint. Apparently patient did not like that. Will have patient follow-up with orthopedics Dr. Romeo Apple locally.    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

## 2020-02-28 NOTE — Discharge Instructions (Addendum)
X-ray of your right thumb shows arthritis at the base. This was most likely the problem. No clinical concerns for stroke. No bony fractures. Would recommend following up with Dr. Romeo Apple from hand surgery information provided above given call in the morning to set up an appointment. Return for new or worse symptoms

## 2020-03-03 ENCOUNTER — Other Ambulatory Visit: Payer: Self-pay

## 2020-03-03 ENCOUNTER — Observation Stay (HOSPITAL_COMMUNITY)
Admission: EM | Admit: 2020-03-03 | Discharge: 2020-03-04 | Disposition: A | Discharge status: Home / Self Care | Payer: Medicare Other | Attending: Internal Medicine | Admitting: Internal Medicine

## 2020-03-03 ENCOUNTER — Emergency Department (HOSPITAL_COMMUNITY): Payer: Medicare Other

## 2020-03-03 ENCOUNTER — Encounter (HOSPITAL_COMMUNITY): Payer: Self-pay

## 2020-03-03 DIAGNOSIS — I130 Hypertensive heart and chronic kidney disease with heart failure and stage 1 through stage 4 chronic kidney disease, or unspecified chronic kidney disease: Secondary | ICD-10-CM | POA: Insufficient documentation

## 2020-03-03 DIAGNOSIS — Z87891 Personal history of nicotine dependence: Secondary | ICD-10-CM | POA: Insufficient documentation

## 2020-03-03 DIAGNOSIS — Z20822 Contact with and (suspected) exposure to covid-19: Secondary | ICD-10-CM | POA: Insufficient documentation

## 2020-03-03 DIAGNOSIS — Z951 Presence of aortocoronary bypass graft: Secondary | ICD-10-CM | POA: Diagnosis not present

## 2020-03-03 DIAGNOSIS — I2510 Atherosclerotic heart disease of native coronary artery without angina pectoris: Secondary | ICD-10-CM | POA: Insufficient documentation

## 2020-03-03 DIAGNOSIS — Z7901 Long term (current) use of anticoagulants: Secondary | ICD-10-CM | POA: Diagnosis not present

## 2020-03-03 DIAGNOSIS — R41841 Cognitive communication deficit: Secondary | ICD-10-CM | POA: Insufficient documentation

## 2020-03-03 DIAGNOSIS — Z79899 Other long term (current) drug therapy: Secondary | ICD-10-CM | POA: Insufficient documentation

## 2020-03-03 DIAGNOSIS — I5042 Chronic combined systolic (congestive) and diastolic (congestive) heart failure: Secondary | ICD-10-CM | POA: Diagnosis not present

## 2020-03-03 DIAGNOSIS — I4819 Other persistent atrial fibrillation: Secondary | ICD-10-CM | POA: Diagnosis not present

## 2020-03-03 DIAGNOSIS — R791 Abnormal coagulation profile: Secondary | ICD-10-CM | POA: Diagnosis not present

## 2020-03-03 DIAGNOSIS — I639 Cerebral infarction, unspecified: Secondary | ICD-10-CM | POA: Diagnosis not present

## 2020-03-03 DIAGNOSIS — E039 Hypothyroidism, unspecified: Secondary | ICD-10-CM | POA: Diagnosis not present

## 2020-03-03 DIAGNOSIS — N1831 Chronic kidney disease, stage 3a: Secondary | ICD-10-CM | POA: Insufficient documentation

## 2020-03-03 DIAGNOSIS — Z7982 Long term (current) use of aspirin: Secondary | ICD-10-CM | POA: Diagnosis not present

## 2020-03-03 DIAGNOSIS — I63512 Cerebral infarction due to unspecified occlusion or stenosis of left middle cerebral artery: Secondary | ICD-10-CM

## 2020-03-03 DIAGNOSIS — Z96643 Presence of artificial hip joint, bilateral: Secondary | ICD-10-CM | POA: Diagnosis not present

## 2020-03-03 DIAGNOSIS — R531 Weakness: Secondary | ICD-10-CM | POA: Diagnosis present

## 2020-03-03 DIAGNOSIS — N183 Chronic kidney disease, stage 3 unspecified: Secondary | ICD-10-CM | POA: Diagnosis present

## 2020-03-03 DIAGNOSIS — I10 Essential (primary) hypertension: Secondary | ICD-10-CM | POA: Diagnosis present

## 2020-03-03 DIAGNOSIS — R29898 Other symptoms and signs involving the musculoskeletal system: Secondary | ICD-10-CM | POA: Insufficient documentation

## 2020-03-03 LAB — CBC WITH DIFFERENTIAL/PLATELET
Abs Immature Granulocytes: 0.01 10*3/uL (ref 0.00–0.07)
Basophils Absolute: 0.1 10*3/uL (ref 0.0–0.1)
Basophils Relative: 1 %
Eosinophils Absolute: 0.3 10*3/uL (ref 0.0–0.5)
Eosinophils Relative: 4 %
HCT: 50 % (ref 39.0–52.0)
Hemoglobin: 16.6 g/dL (ref 13.0–17.0)
Immature Granulocytes: 0 %
Lymphocytes Relative: 27 %
Lymphs Abs: 1.9 10*3/uL (ref 0.7–4.0)
MCH: 31.4 pg (ref 26.0–34.0)
MCHC: 33.2 g/dL (ref 30.0–36.0)
MCV: 94.5 fL (ref 80.0–100.0)
Monocytes Absolute: 0.7 10*3/uL (ref 0.1–1.0)
Monocytes Relative: 10 %
Neutro Abs: 4.1 10*3/uL (ref 1.7–7.7)
Neutrophils Relative %: 58 %
Platelets: 212 10*3/uL (ref 150–400)
RBC: 5.29 MIL/uL (ref 4.22–5.81)
RDW: 14.5 % (ref 11.5–15.5)
WBC: 7.1 10*3/uL (ref 4.0–10.5)
nRBC: 0 % (ref 0.0–0.2)

## 2020-03-03 LAB — PROTIME-INR
INR: 1.7 — ABNORMAL HIGH (ref 0.8–1.2)
Prothrombin Time: 19.6 seconds — ABNORMAL HIGH (ref 11.4–15.2)

## 2020-03-03 LAB — RESP PANEL BY RT-PCR (FLU A&B, COVID) ARPGX2
Influenza A by PCR: NEGATIVE
Influenza B by PCR: NEGATIVE
SARS Coronavirus 2 by RT PCR: NEGATIVE

## 2020-03-03 LAB — COMPREHENSIVE METABOLIC PANEL
ALT: 35 U/L (ref 0–44)
AST: 37 U/L (ref 15–41)
Albumin: 4.2 g/dL (ref 3.5–5.0)
Alkaline Phosphatase: 71 U/L (ref 38–126)
Anion gap: 13 (ref 5–15)
BUN: 29 mg/dL — ABNORMAL HIGH (ref 8–23)
CO2: 24 mmol/L (ref 22–32)
Calcium: 9.8 mg/dL (ref 8.9–10.3)
Chloride: 99 mmol/L (ref 98–111)
Creatinine, Ser: 1.41 mg/dL — ABNORMAL HIGH (ref 0.61–1.24)
GFR, Estimated: 51 mL/min — ABNORMAL LOW (ref >60)
Glucose, Bld: 105 mg/dL — ABNORMAL HIGH (ref 70–99)
Potassium: 3.8 mmol/L (ref 3.5–5.1)
Sodium: 136 mmol/L (ref 135–145)
Total Bilirubin: 0.8 mg/dL (ref 0.3–1.2)
Total Protein: 8.1 g/dL (ref 6.5–8.1)

## 2020-03-03 MED ORDER — ASPIRIN 300 MG RE SUPP: 300.0000 mg | Freq: Every day | RECTAL | Status: DC

## 2020-03-03 MED ORDER — CLOPIDOGREL BISULFATE 75 MG PO TABS
75.0000 mg | ORAL_TABLET | Freq: Every day | ORAL | Status: DC
  Administered 2020-03-04: 75 mg via ORAL
  Filled 2020-03-03: qty 1

## 2020-03-03 MED ORDER — CARVEDILOL 12.5 MG PO TABS: 12.5000 mg | ORAL_TABLET | Freq: Two times a day (BID) | ORAL | Status: DC

## 2020-03-03 MED ORDER — DILTIAZEM HCL 30 MG PO TABS: 15.0000 mg | ORAL_TABLET | Freq: Two times a day (BID) | ORAL | Status: DC | PRN

## 2020-03-03 MED ORDER — ATORVASTATIN CALCIUM 40 MG PO TABS
40.0000 mg | ORAL_TABLET | Freq: Every day | ORAL | Status: DC
  Administered 2020-03-03 – 2020-03-04 (×2): 40 mg via ORAL
  Filled 2020-03-03 (×2): qty 1

## 2020-03-03 MED ORDER — ACETAMINOPHEN 650 MG RE SUPP: 650.0000 mg | RECTAL | Status: DC | PRN

## 2020-03-03 MED ORDER — WARFARIN SODIUM 7.5 MG PO TABS
7.5000 mg | ORAL_TABLET | ORAL | Status: DC
  Administered 2020-03-03: 7.5 mg via ORAL
  Filled 2020-03-03: qty 1

## 2020-03-03 MED ORDER — ACETAMINOPHEN 325 MG PO TABS
650.0000 mg | ORAL_TABLET | ORAL | Status: DC | PRN
  Administered 2020-03-04 (×4): 650 mg via ORAL
  Filled 2020-03-03 (×4): qty 2

## 2020-03-03 MED ORDER — CHLORTHALIDONE 25 MG PO TABS: 25.0000 mg | ORAL_TABLET | Freq: Every day | ORAL | Status: DC

## 2020-03-03 MED ORDER — CLOPIDOGREL BISULFATE 75 MG PO TABS
300.0000 mg | ORAL_TABLET | Freq: Once | ORAL | Status: AC
  Administered 2020-03-03: 300 mg via ORAL
  Filled 2020-03-03: qty 4

## 2020-03-03 MED ORDER — IOHEXOL 350 MG/ML SOLN
75.0000 mL | Freq: Once | INTRAVENOUS | Status: AC | PRN
  Administered 2020-03-03: 75 mL via INTRAVENOUS

## 2020-03-03 MED ORDER — LEVOTHYROXINE SODIUM 50 MCG PO TABS
50.0000 ug | ORAL_TABLET | Freq: Every day | ORAL | Status: DC
  Administered 2020-03-04: 50 ug via ORAL
  Filled 2020-03-03: qty 1

## 2020-03-03 MED ORDER — WARFARIN SODIUM 5 MG PO TABS: 5.0000 mg | ORAL_TABLET | ORAL | Status: DC

## 2020-03-03 MED ORDER — ADULT MULTIVITAMIN W/MINERALS CH
1.0000 | ORAL_TABLET | Freq: Every day | ORAL | Status: DC
  Administered 2020-03-03 – 2020-03-04 (×2): 1 via ORAL
  Filled 2020-03-03 (×2): qty 1

## 2020-03-03 MED ORDER — ASPIRIN EC 81 MG PO TBEC: 81.0000 mg | DELAYED_RELEASE_TABLET | Freq: Every day | ORAL | Status: DC

## 2020-03-03 MED ORDER — WARFARIN - PHYSICIAN DOSING INPATIENT: Freq: Every day | Status: DC

## 2020-03-03 MED ORDER — ACETAMINOPHEN 160 MG/5ML PO SOLN: 650.0000 mg | ORAL | Status: DC | PRN

## 2020-03-03 MED ORDER — ASPIRIN 325 MG PO TABS
325.0000 mg | ORAL_TABLET | Freq: Every day | ORAL | Status: DC
  Administered 2020-03-03 – 2020-03-04 (×2): 325 mg via ORAL
  Filled 2020-03-03 (×2): qty 1

## 2020-03-03 MED ORDER — MONTELUKAST SODIUM 10 MG PO TABS
10.0000 mg | ORAL_TABLET | Freq: Every day | ORAL | Status: DC
  Administered 2020-03-03: 10 mg via ORAL
  Filled 2020-03-03: qty 1

## 2020-03-03 MED ORDER — STROKE: EARLY STAGES OF RECOVERY BOOK: Freq: Once | Status: AC

## 2020-03-03 MED ORDER — WARFARIN SODIUM 5 MG PO TABS: 5.0000 mg | ORAL_TABLET | Freq: Once | ORAL | Status: DC

## 2020-03-03 NOTE — ED Notes (Signed)
Pt says sensory is same on both sides at this time.

## 2020-03-03 NOTE — ED Notes (Signed)
Dr Wilkie Aye in triage to evaluate pt.

## 2020-03-03 NOTE — Progress Notes (Signed)
Briefly reviewed imaging and discussed this case with EDP.  Gregory Davila presents with R hand tingling and clumsiness with a LKW of 2100. NIHSS per EDP was zero. 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 MR Angio head with multifocal multivessel stenosis. Most notable is the L MCA M2 severe multiple stenosis both proximal and distal.  Recs: - I ordered Aspirin 325mg  daily - I ordered Plavix 300mg  load and plavix 75mg  daily x 90 days. - Discussed with ED team and ordered CT Angio head and neck. If he has significant extracranial Left ICA stenosis, he might benefit from an Angiogram and see if there is anything amenable to intervention which can be offered at [facility] but not at [facility]. Otherwise for intracranial stenosis, [employer] trial has demonstrated that medical management is superior to PTAS.  [facility] Pager Number 

## 2020-03-03 NOTE — ED Provider Notes (Signed)
[facility] EMERGENCY DEPARTMENT Provider Note   CSN: [id number] Arrival date & time: 03/03/20  1007     History Chief Complaint  Patient presents with  . Numbness    Gregory Davila is a 80 y.o. male.  HPI   80 year old male with past medical history of paroxysmal atrial fibrillation anticoagulated on Coumadin, CAD, previous PE, HTN presents the emergency department with concern for right-sided facial numbness and right upper extremity weakness.  Patient states he went to bed around 930 last night, in his usual state of health.  He got up overnight multiple times use a restroom and did not notice any symptoms.  When he woke up this morning around 7:30 AM is when he noticed some tingling sensation to the right side of his face stemming from his cheeks to his chin as well as weakness in the right upper extremity.  Denies any specific ataxia, facial droop, difficulty ambulating, speech changes, confusion.  He has never had these symptoms before.  He is otherwise been well, compliant with his medications.  Past Medical History:  Diagnosis Date  . Anginal pain (HCC)   . CAD (coronary artery disease)    a, 2011: occluded LAD with left to right collaterals and moderate disease in the right coronary artery and circumflex, EF 50%. b. NSTEMI 07/2011 in setting of AF-RVR, MV abnl, cath w/ LAD 100%, RCA mod-severe dz, small vessel, med rx  . Chronic renal insufficiency    followed by primary care physician  . Gout   . Hypertension    long-standing  . Myocardial infarction (HCC)   . Paroxysmal atrial fibrillation (HCC)    a. On Coumadin therapy/amidarone. b. 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  . Pulmonary embolism [facility](HCC)     Patient Active Problem List   Diagnosis Date Noted  . Pseudoaneurysm following procedure (HCC) 09/21/2018  . CKD (chronic kidney disease) stage 3, GFR 30-59 ml/min (HCC) 07/21/2018  . Neck pain 07/21/2018  . Atherosclerotic heart  disease of native coronary artery without angina pectoris 07/21/2018  . Gout 02/16/2017  . History of pulmonary embolism 02/16/2017  . Essential hypertension 02/16/2017  . Chronic combined systolic and diastolic CHF (congestive heart failure) (HCC) 01/27/2016  . Pure hypercholesterolemia 01/27/2016  . 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 History of non-ST elevation myocardial infarction (NSTEMI) 07/13/2011  . Atrial fibrillation with RVR (HCC) 07/12/2011  . Persistent atrial fibrillation (HCC)   . Coronary artery disease involving native coronary artery of native heart without angina pectoris   . Hypertension   . Renal insufficiency   . Pulmonary embolism (HCC)   . History of amiodarone therapy 07/27/2010  . Long term (current) use of anticoagulants 04/13/2010  . EDEMA 11/20/2009  . Chronic rhinitis 02/04/2009  . GERD (gastroesophageal reflux disease) 11/05/2006    Past Surgical History:  Procedure Laterality Date  . APPENDECTOMY    . ATRIAL FIBRILLATION ABLATION N/A 09/20/2018   Procedure: ATRIAL FIBRILLATION ABLATION;  Surgeon: Regan Lemmingamnitz, Will Martin, MD;  Location: [facility] INVASIVE CV LAB;  Service: Cardiovascular;  Laterality: N/A;  . CARDIAC CATHETERIZATION  2011 & 2013   [place], LAD 100%, CFX 50%, RCA 80%, small vessel, med rx  . CORONARY ARTERY BYPASS GRAFT N/A 02/14/2014   Procedure: CORONARY ARTERY BYPASS GRAFTING (CABG)X4 LIMA-LAD; SVG-RCA(ENDARDERECTOMY); SVG-OM; SVG-DIAG;  Surgeon: Kerin PernaPeter Van Trigt, MD;  Location: [facility] OR;  Service: Open Heart Surgery;  Laterality: N/A;  . FALSE ANEURYSM REPAIR Right 09/22/2018  Procedure: REPAIR FEMORAL PSEUDO ANEURYSM AND ARTERIOVENOUS FISTULA;  Surgeon: Sherren Kerns, MD;  Location: [facility] OR;  Service: Vascular;  Laterality: Right;  . LEFT HEART CATHETERIZATION WITH CORONARY ANGIOGRAM N/A 02/08/2014   Procedure: LEFT HEART CATHETERIZATION WITH CORONARY ANGIOGRAM;  Surgeon: Micheline Chapman, MD;  Location: [facility] CATH LAB;  Service: Cardiovascular;   Laterality: N/A;  . NM MYOVIEW LTD  2013   Scar in the anteroseptal/periapical distribution with moderate peri-infarct ischemia, EF 43%  . STERNAL INCISION RECLOSURE N/A 02/28/2014   Procedure: Irrigation and Debridement of Sternum with Sternal Rewiring;  Surgeon: Kerin Perna, MD;  Location: [facility] [place] OR;  Service: Thoracic;  Laterality: N/A;  . TEE WITHOUT CARDIOVERSION N/A 02/14/2014   Procedure: TRANSESOPHAGEAL ECHOCARDIOGRAM (TEE);  Surgeon: Kerin Perna, MD;  Location: [facility] OR;  Service: Open Heart Surgery;  Laterality: N/A;  . TOTAL KNEE ARTHROPLASTY Bilateral        Family History  Problem Relation Age of Onset  . Heart attack Mother   . Cancer Mother   . Prostate cancer Father   . Coronary artery disease Brother        MI    Social History   Tobacco Use  . Smoking status: Former Smoker    Types: Cigarettes    Quit date: 01/04/1966    Years since quitting: 54.1  . Smokeless tobacco: Former Neurosurgeon    Types: Chew    Quit date: 01/05/2007  . Tobacco comment: quit chewing tobacco about 4 yrs ago  Vaping Use  . Vaping Use: Never used  Substance Use Topics  . Alcohol use: Yes    Comment: "seldomly drink beer" 1 every 2-3 weeks  . Drug use: No    Home Medications Prior to Admission medications   Medication Sig Start Date End Date Taking? Authorizing Provider  allopurinol (ZYLOPRIM) 100 MG tablet Take 100 mg by mouth 2 (two) times daily. 05/08/19   [provider]  aspirin EC 81 MG tablet Take 81 mg by mouth daily.    [provider]  atorvastatin (LIPITOR) 40 MG tablet Take 1 tablet (40 mg total) by mouth daily at 6 PM. 03/05/14   Thomasena Edis, Almira Coaster L, PA-C  calcium carbonate (TUMS - DOSED IN MG ELEMENTAL CALCIUM) 500 MG chewable tablet Chew 2 tablets by mouth as needed for indigestion or heartburn.     [provider]  carvedilol (COREG) 12.5 MG tablet TAKE ONE TABLET BY MOUTH TWICE DAILY. 04/10/19   Camnitz, Andree Coss, MD  chlorthalidone (HYGROTON) 25 MG  tablet TAKE ONE TABLET BY MOUTH DAILY. Patient taking differently: Take 25 mg by mouth daily. 04/10/19   Camnitz, Andree Coss, MD  COLCRYS 0.6 MG tablet Take 0.6 mg by mouth as needed (gout).  11/24/10   [provider]  diltiazem (CARDIZEM) 30 MG tablet Take 15-30 mg by mouth 2 (two) times daily as needed (increased HR).     [provider]  fluticasone (FLONASE) 50 MCG/ACT nasal spray Place 1 spray into both nostrils daily as needed for allergies or rhinitis.    [provider]  levothyroxine (SYNTHROID) 50 MCG tablet Take 50 mcg by mouth daily before breakfast.  08/30/18 08/30/19  [provider]  loratadine (CLARITIN) 10 MG tablet Take 10 mg by mouth daily.    [provider]  montelukast (SINGULAIR) 10 MG tablet Take 50 mg by mouth at bedtime. 05/09/19   [provider]  multivitamin [facility]) per tablet Take 1 tablet by mouth daily.  [provider]  Naphazoline HCl (CLEAR EYES OP) Place 1 drop into both eyes daily as needed (irritation).    [provider]  nitroGLYCERIN (NITROSTAT) 0.4 MG SL tablet Place 1 tablet (0.4 mg total) under the tongue every 5 (five) minutes x 3 doses as needed for chest pain. 04/30/13   Tonny Bollman, MD  warfarin (COUMADIN) 5 MG tablet Take 1 - 2 tablets daily or as directed 09/21/19   Tonny Bollman, MD    Allergies    Azithromycin and Oxycodone  Review of Systems   Review of Systems  Constitutional: Negative for chills and fever.  HENT: Negative for congestion.   Eyes: Negative for visual disturbance.  Respiratory: Negative for shortness of breath.   Cardiovascular: Negative for chest pain.  Gastrointestinal: Negative for abdominal pain, diarrhea and vomiting.  Genitourinary: Negative for dysuria.  Skin: Negative for rash.  Neurological: Positive for weakness and numbness. Negative for headaches.    Physical Exam Updated Vital Signs BP 120/78   Pulse 73   Temp 97.8 F (36.6  C) (Oral)   Resp 14   Ht 5\' 10"  (1.778 m)   Wt 95.2 kg   SpO2 93%   BMI 30.10 kg/m   Physical Exam Vitals and nursing note reviewed.  Constitutional:      Appearance: Normal appearance.  HENT:     Head: Normocephalic.     Mouth/Throat:     Mouth: Mucous membranes are moist.  Cardiovascular:     Rate and Rhythm: Normal rate.  Pulmonary:     Effort: Pulmonary effort is normal. No respiratory distress.  Abdominal:     Palpations: Abdomen is soft.     Tenderness: There is no abdominal tenderness.  Skin:    General: Skin is warm.  Neurological:     Mental Status: He is alert and oriented to person, place, and time.     Comments: Please see NIH  Psychiatric:        Mood and Affect: Mood normal.     ED Results / Procedures / Treatments   Labs (all labs ordered are listed, but only abnormal results are displayed) Labs Reviewed  COMPREHENSIVE METABOLIC PANEL - Abnormal; Notable for the following components:      Result Value   Glucose, Bld 105 (*)    BUN 29 (*)    Creatinine, Ser 1.41 (*)    GFR, Estimated 51 (*)    All other components within normal limits  PROTIME-INR - Abnormal; Notable for the following components:   Prothrombin Time 19.6 (*)    INR 1.7 (*)    All other components within normal limits  CBC WITH DIFFERENTIAL/PLATELET    EKG None  Radiology CT Head Wo Contrast  Result Date: 03/03/2020 CLINICAL DATA:  Headache, new or worsening. Additional history provided: Patient reports right-sided facial numbness and headache which began this morning. EXAM: CT HEAD WITHOUT CONTRAST TECHNIQUE: Contiguous axial images were obtained from the base of the skull through the vertex without intravenous contrast. COMPARISON:  No pertinent prior exams available for comparison. FINDINGS: Brain: Mild cerebral atrophy. There is no acute intracranial hemorrhage. No demarcated cortical infarct. No extra-axial fluid collection. No evidence of intracranial mass. No midline shift.  Vascular: No hyperdense vessel.  Atherosclerotic calcifications. Skull: No calvarial fracture. Nonspecific smooth thinning of the right parietal calvarium. Sinuses/Orbits: Visualized orbits show no acute finding. Trace bilateral ethmoid and maxillary sinus mucosal thickening at the imaged levels. Other: Debris within the right external auditory canal. Trace  right mastoid effusion. IMPRESSION: No evidence of acute intracranial abnormality. Mild cerebral atrophy. Trace right mastoid effusion. Electronically Signed   By: Jackey Loge DO   On: 03/03/2020 10:51    Procedures Procedures   Medications Ordered in ED Medications - No data to display  ED Course  I have reviewed the triage vital signs and the nursing notes.  Pertinent labs & imaging results that were available during my care of the patient were reviewed by me and considered in my medical decision making (see chart for details).    MDM Rules/Calculators/A&P                          80 year old male presents the emergency department with pins and needle sensation in the right face and subjective right upper extremity weakness.  He is an NIH of 0, he has sensory in the right face but does state that it feels "different", not specifically less sensation.  He has full strength in the right upper extremity, he is neurovascularly intact, no ataxia.  Patient is out of the window and with an NIH of 0 will not be placed as a stroke page.  We will do a neurologic evaluation.  CT of the head did not show anything, MRI does show small acute infarcts in the left MCA territory, MRA was done and shows scattered stenosis.  Neurology was consulted.  They recommend CT angio of the head and neck for further evaluation and to evaluate the patient for either admission at any [facility] or transfer to [facility] if there is a large vessel abnormality.  Hospitalist has been made aware, we are pending CTA and neuro follow-up.  Patient will require admission. Final  Clinical Impression(s) / ED Diagnoses Final diagnoses:  None    Rx / DC Orders ED Discharge Orders    None       Rozelle Logan, DO 03/03/20 1538

## 2020-03-03 NOTE — ED Notes (Signed)
Patient off unit to CT scan at this time.

## 2020-03-03 NOTE — H&P (Signed)
TRH H&P    Patient Demographics:    Gregory Davila, is a 80 y.o. male  MRN: [id number]  DOB - 1940-06-24  Admit Date - 03/03/2020  Referring MD/NP/PA: Wilkie AyeHorton  Outpatient Primary MD for the patient is Suzan Slickucker, Alethea Y, MD  Patient coming from: Home  Chief complaint- Right sided weakness/tingling   HPI:    Gregory Davila  is a 80 y.o. male, with history of PE, paroxysmal atrial fibrillation on Coumadin, myocardial infarction, hypertension, gout, chronic renal insufficiency, CAD, and more presents the ED with a chief complaint of right face flushing, right arm tingling and weakness.  Patient reports that his last known well was 9:30 PM on 03/02/2020.  When he woke up at 730 in a.m. symptoms are present.  He reports his symptoms are constant.  They were moderate in intensity, and are mild in intensity now.  He reports that they must of started to ease up over the afternoon he is not sure.  Patient reports he had an associated headache that first he says was over the left temporal and parietal regions, but then reports it may been occiput as well.  Patient reports that he does not normally get headaches, but this 1 was mild in intensity.  He did not take anything for it.  He describes the pain is a "little sharp pain."  He had no change in vision, change in hearing, change in speech, difficulty swallowing during any of this today.  Patient does report that on 02/28/2020 he was seen in the ED for numbness in his thumb.  He reports at that time he was not able to use his thumb, and that it was totally numb.  Reports he was diagnosed with arthritis.  At that time he did not have numbness or weakness anywhere else including other parts of the right upper extremity, right face, the right leg.  He does report it was the right thumb that was affected though.  Patient's INR today is 1.7.  He thinks last time it was checked it was 2.9.  He  reports that there were no changes made to his Coumadin regimen at the last check 2 weeks ago.  He has not missed any doses.  He reports that he checks his blood pressure occasionally at home, its been normal for him.  He has not seen any outlying numbers that are either hypertensive or hypotensive.  Patient is on a statin medication already at home.  Patient reports he does not smoke, does not drink, does not use illicit drugs.  He has been vaccinated for COVID.  He is full code.    IN the ED T 97.8, HR 72, R 14-25, BP 148/92, 95% WBC 7.1, Hgb 16.6 Chem panel = BUN/Cr 29/1.41 -baseline CT angio neck = 50% narrowing Left ICA CT head = no evidence of acute intracranial abnormality, mild cerebral atrophy, trace Rt mastoid effusion MRI brain = several small acute left MCA territory infarcts w/in left frontal and parietal lobes as well as left basal ganglia measuring up to 10mm. One  infarct affects left frontal lobe motor strip.  MRA head = no intracranial large vessel occlusion identified. Severe stenoses in superior and inferior division proximal and mid M2, left middle cerebral artery branches. Moderate stenosis w/in a superior division proximal M2 Rt MCA branch. Apparent mod/severe stenosis of P2 Rt post cerebral artery Neuro consulted abd rec'd dual antiplatelet therapy as well as CTA neck to determine if pt will need intervention that would warrant cone admission, or if they can stay here.  Update-CTA shows 50% narrowing of the carotid, neuro reports that it is okay for patient to be admitted here per Dr. Jeraldine Loots in the ER handoff physician.    Review of systems:    In addition to the HPI above,  No Fever-chills, Admits to headache, No changes with Vision or hearing, No problems swallowing food or Liquids, No Chest pain, Cough or Shortness of Breath, No Abdominal pain, No Nausea or Vomiting, bowel movements are regular, No Blood in stool or Urine, No dysuria, No new skin rashes or  bruises, No new joints pains-aches,  No recent weight gain or loss, No polyuria, polydypsia or polyphagia, No significant Mental Stressors.  All other systems reviewed and are negative.    Past History of the following :    Past Medical History:  Diagnosis Date  . Anginal pain (HCC)   . CAD (coronary artery disease)    a, 2011: occluded LAD with left to right collaterals and moderate disease in the right coronary artery and circumflex, EF 50%. b. NSTEMI 07/2011 in setting of AF-RVR, MV abnl, cath w/ LAD 100%, RCA mod-severe dz, small vessel, med rx  . Chronic renal insufficiency    followed by primary care physician  . Gout   . Hypertension    long-standing  . Myocardial infarction (HCC)   . Paroxysmal atrial fibrillation (HCC)    a. On Coumadin therapy/amidarone. b. 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  . Pulmonary embolism [facility])       Past Surgical History:  Procedure Laterality Date  . APPENDECTOMY    . ATRIAL FIBRILLATION ABLATION N/A 09/20/2018   Procedure: ATRIAL FIBRILLATION ABLATION;  Surgeon: Regan Lemming, MD;  Location: [facility] INVASIVE CV LAB;  Service: Cardiovascular;  Laterality: N/A;  . CARDIAC CATHETERIZATION  2011 & 2013   2013 L [place], LAD 100%, CFX 50%, RCA 80%, small vessel, med rx  . CORONARY ARTERY BYPASS GRAFT N/A 02/14/2014   Procedure: CORONARY ARTERY BYPASS GRAFTING (CABG)X4 LIMA-LAD; SVG-RCA(ENDARDERECTOMY); SVG-OM; SVG-DIAG;  Surgeon: Kerin Perna, MD;  Location: [facility] OR;  Service: Open Heart Surgery;  Laterality: N/A;  . FALSE ANEURYSM REPAIR Right 09/22/2018   Procedure: REPAIR FEMORAL PSEUDO ANEURYSM AND ARTERIOVENOUS FISTULA;  Surgeon: Sherren Kerns, MD;  Location: [facility] OR;  Service: Vascular;  Laterality: Right;  . LEFT HEART CATHETERIZATION WITH CORONARY ANGIOGRAM N/A 02/08/2014   Procedure: LEFT HEART CATHETERIZATION WITH CORONARY ANGIOGRAM;  Surgeon: Micheline Chapman, MD;  Location: [facility] CATH LAB;  Service:  Cardiovascular;  Laterality: N/A;  . NM MYOVIEW LTD  2013   Scar in the anteroseptal/periapical distribution with moderate peri-infarct ischemia, EF 43%  . STERNAL INCISION RECLOSURE N/A 02/28/2014   Procedure: Irrigation and Debridement of Sternum with Sternal Rewiring;  Surgeon: Kerin Perna, MD;  Location: [facility] OR;  Service: Thoracic;  Laterality: N/A;  . TEE WITHOUT CARDIOVERSION N/A 02/14/2014   Procedure: TRANSESOPHAGEAL ECHOCARDIOGRAM (TEE);  Surgeon: Kerin Perna, MD;  Location: [facility] OR;  Service: Open  Heart Surgery;  Laterality: N/A;  . TOTAL KNEE ARTHROPLASTY Bilateral       Social History:      Social History   Tobacco Use  . Smoking status: Former Smoker    Types: Cigarettes    Quit date: 01/04/1966    Years since quitting: 54.1  . Smokeless tobacco: Former Neurosurgeon    Types: Chew    Quit date: 01/05/2007  . Tobacco comment: quit chewing tobacco about 4 yrs ago  Substance Use Topics  . Alcohol use: Yes    Comment: "seldomly drink beer" 1 every 2-3 weeks       Family History :     Family History  Problem Relation Age of Onset  . Heart attack Mother   . Cancer Mother   . Prostate cancer Father   . Coronary artery disease Brother        MI      Home Medications:   Prior to Admission medications   Medication Sig Start Date End Date Taking? Authorizing Provider  allopurinol (ZYLOPRIM) 100 MG tablet Take 100 mg by mouth 2 (two) times daily. 05/08/19   [provider]  aspirin EC 81 MG tablet Take 81 mg by mouth daily.    [provider]  atorvastatin (LIPITOR) 40 MG tablet Take 1 tablet (40 mg total) by mouth daily at 6 PM. 03/05/14   Thomasena Edis, Almira Coaster L, PA-C  calcium carbonate (TUMS - DOSED IN MG ELEMENTAL CALCIUM) 500 MG chewable tablet Chew 2 tablets by mouth as needed for indigestion or heartburn.     [provider]  carvedilol (COREG) 12.5 MG tablet TAKE ONE TABLET BY MOUTH TWICE DAILY. Patient taking differently: Take 12.5 mg by mouth in  the morning and at bedtime. 04/10/19   Camnitz, Andree Coss, MD  chlorthalidone (HYGROTON) 25 MG tablet TAKE ONE TABLET BY MOUTH DAILY. Patient taking differently: Take 25 mg by mouth daily. 04/10/19   Camnitz, Andree Coss, MD  COLCRYS 0.6 MG tablet Take 0.6 mg by mouth as needed (gout).  11/24/10   [provider]  diltiazem (CARDIZEM) 30 MG tablet Take 15-30 mg by mouth 2 (two) times daily as needed (increased HR).     [provider]  fluticasone (FLONASE) 50 MCG/ACT nasal spray Place 1 spray into both nostrils daily as needed for allergies or rhinitis.    [provider]  levothyroxine (SYNTHROID) 50 MCG tablet Take 50 mcg by mouth daily before breakfast.  08/30/18 08/30/19  [provider]  loratadine (CLARITIN) 10 MG tablet Take 10 mg by mouth daily.    [provider]  montelukast (SINGULAIR) 10 MG tablet Take 50 mg by mouth at bedtime. 05/09/19   [provider]  multivitamin [facility]) per tablet Take 1 tablet by mouth daily.      [provider]  Naphazoline HCl (CLEAR EYES OP) Place 1 drop into both eyes daily as needed (irritation).    [provider]  nitroGLYCERIN (NITROSTAT) 0.4 MG SL tablet Place 1 tablet (0.4 mg total) under the tongue every 5 (five) minutes x 3 doses as needed for chest pain. 04/30/13   Tonny Bollman, MD  warfarin (COUMADIN) 5 MG tablet Take 1 - 2 tablets daily or as directed 09/21/19   Tonny Bollman, MD     Allergies:     Allergies  Allergen Reactions  . Azithromycin Nausea And Vomiting    "throws for a loop"  . Oxycodone Other (See Comments)    nightmares  Physical Exam:   Vitals  Blood pressure 127/79, pulse 73, temperature 97.8 F (36.6 C), temperature source Oral, resp. rate 17, height 5\' 10"  (1.778 m), weight 95.2 kg, SpO2 92 %.  1.  General: Patient lying supine in bed with head of bed elevated in no acute distress  2. Psychiatric: Mood and behavior normal for  situation, alert and oriented x3, cooperative with exam  3. Neurologic: NIH 0, cranial nerves II through XII are intact, equal strength in the upper extremities bilaterally-5/5, equal strength in the lower extremities bilaterally, equal sensation in the lower extremities.  Reduced sensation in the right upper extremity as compared to the left.  Hypersensitivity in the right face.  4. HEENMT:  Head is atraumatic, normocephalic, pupils reactive to light, neck is supple, trachea is midline, mucous membranes are moist  5. Respiratory : Clear to auscultation bilaterally without wheezes, rhonchi, rales, no cyanosis  6. Cardiovascular : Heart rate is normal, rhythm is regular, no murmurs rubs or gallops  7. Gastrointestinal:  Abdomen is soft, nondistended, nontender to palpation  8. Skin:  Is warm dry and intact without acute lesion on limited exam  9.Musculoskeletal:  No acute deformity, no calf tenderness, no peripheral edema    Data Review:    CBC Recent Labs  Lab 03/03/20 1033  WBC 7.1  HGB 16.6  HCT 50.0  PLT 212  MCV 94.5  MCH 31.4  MCHC 33.2  RDW 14.5  LYMPHSABS 1.9  MONOABS 0.7  EOSABS 0.3  BASOSABS 0.1   ------------------------------------------------------------------------------------------------------------------  Results for orders placed or performed during the hospital encounter of 03/03/20 (from the past 48 hour(s))  CBC with Differential     Status: None   Collection Time: 03/03/20 10:33 AM  Result Value Ref Range   WBC 7.1 4.0 - 10.5 K/uL   RBC 5.29 4.22 - 5.81 MIL/uL   Hemoglobin 16.6 13.0 - 17.0 g/dL   HCT 03/05/20 06.2 - 37.6 %   MCV 94.5 80.0 - 100.0 fL   MCH 31.4 26.0 - 34.0 pg   MCHC 33.2 30.0 - 36.0 g/dL   RDW 28.3 15.1 - 76.1 %   Platelets 212 150 - 400 K/uL   nRBC 0.0 0.0 - 0.2 %   Neutrophils Relative % 58 %   Neutro Abs 4.1 1.7 - 7.7 K/uL   Lymphocytes Relative 27 %   Lymphs Abs 1.9 0.7 - 4.0 K/uL   Monocytes Relative 10 %   Monocytes  Absolute 0.7 0.1 - 1.0 K/uL   Eosinophils Relative 4 %   Eosinophils Absolute 0.3 0.0 - 0.5 K/uL   Basophils Relative 1 %   Basophils Absolute 0.1 0.0 - 0.1 K/uL   Immature Granulocytes 0 %   Abs Immature Granulocytes 0.01 0.00 - 0.07 K/uL    Comment: Performed at [facility], [address], [place], [place]  Comprehensive metabolic panel     Status: Abnormal   Collection Time: 03/03/20 10:33 AM  Result Value Ref Range   Sodium 136 135 - 145 mmol/L   Potassium 3.8 3.5 - 5.1 mmol/L   Chloride 99 98 - 111 mmol/L   CO2 24 22 - 32 mmol/L   Glucose, Bld 105 (H) 70 - 99 mg/dL    Comment: Glucose reference range applies only to samples taken after fasting for at least 8 hours.   BUN 29 (H) 8 - 23 mg/dL   Creatinine, Ser 03/05/20 (H) 0.61 - 1.24 mg/dL   Calcium 9.8 8.9 - 10.3  mg/dL   Total Protein 8.1 6.5 - 8.1 g/dL   Albumin 4.2 3.5 - 5.0 g/dL   AST 37 15 - 41 U/L   ALT 35 0 - 44 U/L   Alkaline Phosphatase 71 38 - 126 U/L   Total Bilirubin 0.8 0.3 - 1.2 mg/dL   GFR, Estimated 51 (L) >60 mL/min    Comment: (NOTE) Calculated using the CKD-EPI Creatinine Equation (2021)    Anion gap 13 5 - 15    Comment: Performed at [facility], [address], [place], [place] [address]  Protime-INR     Status: Abnormal   Collection Time: 03/03/20 10:33 AM  Result Value Ref Range   Prothrombin Time 19.6 (H) 11.4 - 15.2 seconds   INR 1.7 (H) 0.8 - 1.2    Comment: (NOTE) INR goal varies based on device and disease states. Performed at [facility], [address] [place], [place] [address]     Chemistries  Recent Labs  Lab 03/03/20 1033  NA 136  K 3.8  CL 99  CO2 24  GLUCOSE 105*  BUN 29*  CREATININE 1.41*  CALCIUM 9.8  AST 37  ALT 35  ALKPHOS 71  BILITOT 0.8    ------------------------------------------------------------------------------------------------------------------  ------------------------------------------------------------------------------------------------------------------ GFR: Estimated Creatinine Clearance: 49.2 mL/min (A) (by C-G formula based on SCr of 1.41 mg/dL (H)). Liver Function Tests: Recent Labs  Lab 03/03/20 1033  AST 37  ALT 35  ALKPHOS 71  BILITOT 0.8  PROT 8.1  ALBUMIN 4.2   No results for input(s): LIPASE, AMYLASE in the last 168 hours. No results for input(s): AMMONIA in the last 168 hours. Coagulation Profile: Recent Labs  Lab 03/03/20 1033  INR 1.7*   Cardiac Enzymes: No results for input(s): CKTOTAL, CKMB, CKMBINDEX, TROPONINI in the last 168 hours. BNP (last 3 results) No results for input(s): PROBNP in the last 8760 hours. HbA1C: No results for input(s): HGBA1C in the last 72 hours. CBG: No results for input(s): GLUCAP in the last 168 hours. Lipid Profile: No results for input(s): CHOL, HDL, LDLCALC, TRIG, CHOLHDL, LDLDIRECT in the last 72 hours. Thyroid Function Tests: No results for input(s): TSH, T4TOTAL, FREET4, T3FREE, THYROIDAB in the last 72 hours. Anemia Panel: No results for input(s): VITAMINB12, FOLATE, FERRITIN, TIBC, IRON, RETICCTPCT in the last 72 hours.  --------------------------------------------------------------------------------------------------------------- Urine analysis:    Component Value Date/Time   COLORURINE YELLOW 02/24/2014 1245   APPEARANCEUR CLEAR 02/24/2014 1245   LABSPEC 1.009 02/24/2014 1245   PHURINE 6.5 02/24/2014 1245   GLUCOSEU NEGATIVE 02/24/2014 1245   HGBUR NEGATIVE 02/24/2014 1245   BILIRUBINUR NEGATIVE 02/24/2014 1245   KETONESUR NEGATIVE 02/24/2014 1245   PROTEINUR NEGATIVE 02/24/2014 1245   UROBILINOGEN 0.2 02/24/2014 1245   NITRITE NEGATIVE 02/24/2014 1245   LEUKOCYTESUR MODERATE (A) 02/24/2014 1245      Imaging Results:    CT  Head Wo Contrast  Result Date: 03/03/2020 CLINICAL DATA:  Headache, new or worsening. Additional history provided: Patient reports right-sided facial numbness and headache which began this morning. EXAM: CT HEAD WITHOUT CONTRAST TECHNIQUE: Contiguous axial images were obtained from the base of the skull through the vertex without intravenous contrast. COMPARISON:  No pertinent prior exams available for comparison. FINDINGS: Brain: Mild cerebral atrophy. There is no acute intracranial hemorrhage. No demarcated cortical infarct. No extra-axial fluid collection. No evidence of intracranial mass. No midline shift. Vascular: No hyperdense vessel.  Atherosclerotic calcifications. Skull: No calvarial fracture. Nonspecific smooth thinning of the right parietal calvarium. Sinuses/Orbits: Visualized orbits show  no acute finding. Trace bilateral ethmoid and maxillary sinus mucosal thickening at the imaged levels. Other: Debris within the right external auditory canal. Trace right mastoid effusion. IMPRESSION: No evidence of acute intracranial abnormality. Mild cerebral atrophy. Trace right mastoid effusion. Electronically Signed   By: Jackey Loge DO   On: 03/03/2020 10:51   MR ANGIO HEAD WO CONTRAST  Result Date: 03/03/2020 CLINICAL DATA:  Stroke suspected. Additional history provided: Patient reports right arm weakness and right-sided facial numbness, slight headache. EXAM: MRI HEAD WITHOUT CONTRAST MRA HEAD WITHOUT CONTRAST TECHNIQUE: Multiplanar, multiecho pulse sequences of the brain and surrounding structures were obtained without intravenous contrast. Angiographic images of the head were obtained using MRA technique without contrast. COMPARISON:  Noncontrast head CT performed earlier today 03/03/2020. FINDINGS: MRI HEAD FINDINGS Brain: Mild intermittent motion degradation. Mild cerebral atrophy. There are multiple small acute left MCA territory infarcts measuring up to 10 mm, as follows. Acute cortical infarct  within the left frontal lobe motor strip (series 5, image 30). Adjacent acute cortical infarct within the high posterior left frontal lobe (series 5, image 31). Acute infarct within the posterior left frontal lobe white matter (series 5, image 27). Acute infarct within the left caudate nucleus (series 5, image 22). Suspected additional tiny acute cortical infarct within the inferolateral left parietal lobe (series 5, image 22). Minimal background multifocal T2/FLAIR hyperintensity within the cerebral white matter is nonspecific, but compatible with chronic small vessel ischemic disease. Subcentimeter chronic infarct within the right cerebellum. No evidence of intracranial mass. No chronic intracranial blood products. No extra-axial fluid collection. No midline shift. Vascular: Reported below. Skull and upper cervical spine: No focal marrow lesion. Incompletely assessed cervical spondylosis. Sinuses/Orbits: Visualized orbits show no acute finding. Trace bilateral ethmoid sinus mucosal thickening. MRA HEAD FINDINGS The intracranial internal carotid arteries are patent. The M1 middle cerebral arteries are patent. Multiple severe stenoses within both superior and inferior division proximal and mid M2 left MCA branch vessels (for instance as seen on series 1043, image 5 and image 12). Moderate stenosis within a superior division proximal M2 right MCA branch vessel. The anterior cerebral arteries are patent. No intracranial aneurysm is identified. The intracranial vertebral arteries are patent. The basilar artery is patent. The posterior cerebral arteries are patent. The posterior cerebral arteries are patent. Apparent moderate/severe stenosis within the mid to distal P2 segment of the right posterior cerebral artery. IMPRESSION: MRI brain: 1. 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 Notably, one of  these infarcts affects the left frontal lobe motor strip. 2. Background mild cerebral atrophy and chronic small vessel ischemic disease. 3. Subcentimeter chronic infarct within the right cerebellar hemisphere. MRA head: 1. No intracranial large vessel occlusion is identified. However, there are multiple severe stenoses within both superior and inferior division proximal and mid M2 left middle cerebral artery branch vessels. 2. Moderate stenosis within a superior division proximal M2 right MCA branch vessel. 3. Apparent moderate/severe stenosis of the P2 right posterior cerebral artery. Electronically Signed   By: Jackey Loge DO   On: 03/03/2020 14:20   MR Brain Wo Contrast (neuro protocol)  Result Date: 03/03/2020 CLINICAL DATA:  Stroke suspected. Additional history provided: Patient reports right arm weakness and right-sided facial numbness, slight headache. EXAM: MRI HEAD WITHOUT CONTRAST MRA HEAD WITHOUT CONTRAST TECHNIQUE: Multiplanar, multiecho pulse sequences of the brain and surrounding structures were obtained without  intravenous contrast. Angiographic images of the head were obtained using MRA technique without contrast. COMPARISON:  Noncontrast head CT performed earlier today 03/03/2020. FINDINGS: MRI HEAD FINDINGS Brain: Mild intermittent motion degradation. Mild cerebral atrophy. There are multiple small acute left MCA territory infarcts measuring up to 10 mm, as follows. Acute cortical infarct within the left frontal lobe motor strip (series 5, image 30). Adjacent acute cortical infarct within the high posterior left frontal lobe (series 5, image 31). Acute infarct within the posterior left frontal lobe white matter (series 5, image 27). Acute infarct within the left caudate nucleus (series 5, image 22). Suspected additional tiny acute cortical infarct within the inferolateral left parietal lobe (series 5, image 22). Minimal background multifocal T2/FLAIR hyperintensity within the cerebral white  matter is nonspecific, but compatible with chronic small vessel ischemic disease. Subcentimeter chronic infarct within the right cerebellum. No evidence of intracranial mass. No chronic intracranial blood products. No extra-axial fluid collection. No midline shift. Vascular: Reported below. Skull and upper cervical spine: No focal marrow lesion. Incompletely assessed cervical spondylosis. Sinuses/Orbits: Visualized orbits show no acute finding. Trace bilateral ethmoid sinus mucosal thickening. MRA HEAD FINDINGS The intracranial internal carotid arteries are patent. The M1 middle cerebral arteries are patent. Multiple severe stenoses within both superior and inferior division proximal and mid M2 left MCA branch vessels (for instance as seen on series 1043, image 5 and image 12). Moderate stenosis within a superior division proximal M2 right MCA branch vessel. The anterior cerebral arteries are patent. No intracranial aneurysm is identified. The intracranial vertebral arteries are patent. The basilar artery is patent. The posterior cerebral arteries are patent. The posterior cerebral arteries are patent. Apparent moderate/severe stenosis within the mid to distal P2 segment of the right posterior cerebral artery. IMPRESSION: MRI brain: 1. 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 Notably, one of these infarcts affects the left frontal lobe motor strip. 2. Background mild cerebral atrophy and chronic small vessel ischemic disease. 3. Subcentimeter chronic infarct within the right cerebellar hemisphere. MRA head: 1. No intracranial large vessel occlusion is identified. However, there are multiple severe stenoses within both superior and inferior division proximal and mid M2 left middle cerebral artery branch vessels. 2. Moderate stenosis within a superior division proximal M2 right MCA branch vessel. 3.  Apparent moderate/severe stenosis of the P2 right posterior cerebral artery. Electronically Signed   By: Jackey Loge DO   On: 03/03/2020 14:20       Assessment & Plan:    Active Problems:   Hypertension   CVA (cerebral vascular accident) (HCC)   Hypothyroid   Subtherapeutic international normalized ratio (INR)   1. CVA 1. Multiple small left MCA territories affected 2. Neuro consulted from ED and reports okay to admit to any pen 3. CVA order set utilized 4. In the ED all of the brain imaging was done including MRI, MRA, CT head, CTA neck 5. And plan for echo in the a.m., PT OT ST eval 6. Continue aspirin and Plavix 7. Continue statin and blood pressure control 8. Continue to monitor on telemetry 2. Hypothyroid 1. Check TSH 2. Continue Synthroid 3. A. Fib/subtherapeutic INR 1. No known episodes of A. fib since ablation 2. Continue beta-blocker, continue diltiazem, continue Coumadin 3. INR 1.7, give extra dose of Coumadin tonight 4. Check INR again in the a.m. 5. Monitor on telemetry 4. Hypertension 1.  Continue diltiazem, Coreg, chlorthalidone 5.    DVT Prophylaxis-   Coumadin- SCDs  AM Labs Ordered, also please review Full Orders  Family Communication: Admission, patients condition and plan of care including tests being ordered have been discussed with the patient and wife who indicate understanding and agree with the plan and Code Status.  Code Status: Full Admission status observation       [place] B Zierle-Ghosh DO

## 2020-03-03 NOTE — ED Triage Notes (Addendum)
Pt presents to ED with complaints of right arm weakness and right sided facial numbness, slight headache since he woke up at 0730 this am. LKW 2130 last night

## 2020-03-04 ENCOUNTER — Observation Stay (HOSPITAL_BASED_OUTPATIENT_CLINIC_OR_DEPARTMENT_OTHER): Payer: Medicare Other

## 2020-03-04 DIAGNOSIS — I4819 Other persistent atrial fibrillation: Secondary | ICD-10-CM | POA: Diagnosis not present

## 2020-03-04 DIAGNOSIS — I6389 Other cerebral infarction: Secondary | ICD-10-CM | POA: Diagnosis not present

## 2020-03-04 DIAGNOSIS — I10 Essential (primary) hypertension: Secondary | ICD-10-CM | POA: Diagnosis not present

## 2020-03-04 DIAGNOSIS — R791 Abnormal coagulation profile: Secondary | ICD-10-CM | POA: Diagnosis not present

## 2020-03-04 DIAGNOSIS — N1831 Chronic kidney disease, stage 3a: Secondary | ICD-10-CM

## 2020-03-04 DIAGNOSIS — I639 Cerebral infarction, unspecified: Secondary | ICD-10-CM

## 2020-03-04 LAB — CBC
HCT: 48.9 % (ref 39.0–52.0)
Hemoglobin: 16.1 g/dL (ref 13.0–17.0)
MCH: 31.1 pg (ref 26.0–34.0)
MCHC: 32.9 g/dL (ref 30.0–36.0)
MCV: 94.4 fL (ref 80.0–100.0)
Platelets: 212 10*3/uL (ref 150–400)
RBC: 5.18 MIL/uL (ref 4.22–5.81)
RDW: 14.6 % (ref 11.5–15.5)
WBC: 7.3 10*3/uL (ref 4.0–10.5)
nRBC: 0 % (ref 0.0–0.2)

## 2020-03-04 LAB — HEMOGLOBIN A1C
Hgb A1c MFr Bld: 5.9 % — ABNORMAL HIGH (ref 4.8–5.6)
Mean Plasma Glucose: 122.63 mg/dL

## 2020-03-04 LAB — ECHOCARDIOGRAM COMPLETE
AR max vel: 2.58 cm2
AV Area VTI: 2.71 cm2
AV Area mean vel: 2.59 cm2
AV Mean grad: 6.4 mmHg
AV Peak grad: 12 mmHg
Ao pk vel: 1.73 m/s
Area-P 1/2: 2.48 cm2
Height: 70 in
P 1/2 time: 458 msec
S' Lateral: 2.82 cm
Weight: 3361.57 oz

## 2020-03-04 LAB — TSH: TSH: 4.615 u[IU]/mL — ABNORMAL HIGH (ref 0.350–4.500)

## 2020-03-04 LAB — LIPID PANEL
Cholesterol: 133 mg/dL (ref 0–200)
HDL: 31 mg/dL — ABNORMAL LOW (ref >40)
LDL Cholesterol: 63 mg/dL (ref 0–99)
Total CHOL/HDL Ratio: 4.3 RATIO
Triglycerides: 194 mg/dL — ABNORMAL HIGH (ref <150)
VLDL: 39 mg/dL (ref 0–40)

## 2020-03-04 LAB — COMPREHENSIVE METABOLIC PANEL
ALT: 38 U/L (ref 0–44)
AST: 45 U/L — ABNORMAL HIGH (ref 15–41)
Albumin: 3.9 g/dL (ref 3.5–5.0)
Alkaline Phosphatase: 62 U/L (ref 38–126)
Anion gap: 13 (ref 5–15)
BUN: 30 mg/dL — ABNORMAL HIGH (ref 8–23)
CO2: 22 mmol/L (ref 22–32)
Calcium: 9.2 mg/dL (ref 8.9–10.3)
Chloride: 101 mmol/L (ref 98–111)
Creatinine, Ser: 1.4 mg/dL — ABNORMAL HIGH (ref 0.61–1.24)
GFR, Estimated: 51 mL/min — ABNORMAL LOW (ref >60)
Glucose, Bld: 80 mg/dL (ref 70–99)
Potassium: 3.8 mmol/L (ref 3.5–5.1)
Sodium: 136 mmol/L (ref 135–145)
Total Bilirubin: 1.3 mg/dL — ABNORMAL HIGH (ref 0.3–1.2)
Total Protein: 7.4 g/dL (ref 6.5–8.1)

## 2020-03-04 LAB — PROTIME-INR
INR: 1.7 — ABNORMAL HIGH (ref 0.8–1.2)
Prothrombin Time: 19.6 seconds — ABNORMAL HIGH (ref 11.4–15.2)

## 2020-03-04 MED ORDER — WARFARIN - PHARMACIST DOSING INPATIENT: Freq: Every day | Status: DC

## 2020-03-04 MED ORDER — CLOPIDOGREL BISULFATE 75 MG PO TABS: 75.0000 mg | ORAL_TABLET | Freq: Every day | ORAL | 2 refills | Status: DC

## 2020-03-04 MED ORDER — WARFARIN SODIUM 7.5 MG PO TABS
7.5000 mg | ORAL_TABLET | Freq: Once | ORAL | Status: AC
  Administered 2020-03-04: 7.5 mg via ORAL
  Filled 2020-03-04: qty 1

## 2020-03-04 MED ORDER — CARVEDILOL 3.125 MG PO TABS
6.2500 mg | ORAL_TABLET | Freq: Two times a day (BID) | ORAL | Status: DC
  Administered 2020-03-04 (×2): 6.25 mg via ORAL
  Filled 2020-03-04 (×2): qty 2

## 2020-03-04 NOTE — Progress Notes (Signed)
*  PRELIMINARY RESULTS* Echocardiogram 2D Echocardiogram has been performed.  Jeryl Columbia 03/04/2020, 8:59 AM

## 2020-03-04 NOTE — Evaluation (Addendum)
Physical Therapy Evaluation Only Patient Details Name: Gregory Davila MRN: [id number] DOB: Oct 08, 1940 Today's Date: 03/04/2020   History of Present Illness  Gregory Davila  is a 80 y.o. 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  Clinical Impression  Pt reports feeling back to normal, except a slight headache across his forehead. Pt denies numbness/tingling or weakness throughout all extremities. Pt slightly weaker in L knee compared to R, but possibly due to TKAs and baseline. Pt able to perform sidesteps, direction changes, speed changes, head turns, sudden stops without AD and no LOB or unsteadiness noted, SUPV for safety. Pt assumes Romberg stance and maintains for 15 seconds with mild trunk sway. Pt requires HHA to assume and maintain tandem stance bilaterally. Pt reports "some" balance deficits over the years and open to OPPT for higher level balance training and strengthening. Pt is safe to ambulate around hospital room and in hallway with nursing, no acute PT needs identified at this time- RN notified, will sign off.    Follow Up Recommendations Outpatient PT    Equipment Recommendations  None recommended by PT    Recommendations for Other Services       Precautions / Restrictions Precautions Precautions: None Restrictions Weight Bearing Restrictions: No      Mobility  Bed Mobility Overal bed mobility: Modified Independent   Transfers Overall transfer level: Modified independent  General transfer comment: pt able to complete STS transfers without physical assistance, light UE assisting to power up  Ambulation/Gait Ambulation/Gait assistance: Modified independent  (Device/Increase time);Supervision Gait Distance (Feet): 200 Feet Assistive device: None Gait Pattern/deviations: WFL(Within Functional Limits) Gait velocity: WFL   General Gait Details: Pt ambulates in hallway, completes head turns, speed changes, sidesteps in open environment, and direction changes without LOB  Stairs            Wheelchair Mobility    Modified Rankin (Stroke Patients Only)       Balance Overall balance assessment: Modified Independent  Tandem Stance - Right Leg:  (requires HHA) Tandem Stance - Left Leg:  (requires HHA) Rhomberg - Eyes Opened: 15   High level balance activites: Side stepping;Direction changes;Turns;Sudden stops;Head turns High Level Balance Comments: Pt able to maintain Romberg stance for 15 seconds with mild trunk sway, assumes split stance but unable to assume tandem stance without HHA nor maintain without HHA. Pt completes sidesteps, direction changes, turns, sudden stops, speed changes and head turns without LOB or unsteadiness noted             Pertinent Vitals/Pain Pain Assessment: No/denies pain    Home Living Family/patient expects to be discharged to:: Private residence Living Arrangements: Spouse/significant other Available Help at Discharge: Family;Available PRN/intermittently Type of Home: House Home Access: Stairs to enter Entrance Stairs-Rails: None Entrance Stairs-Number of Steps: 1 Home Layout: One level (1 step kitchen<>den with doorway to hold onto) Home Equipment: Walker - 4 wheels;Cane - single point;Grab bars - tub/shower;Grab bars - toilet;Shower seat - built in      Prior Function Level of Independence: Independent         Comments: Pt reports independent with ambulation, ADLs, drives, walks daily for exercise. Pt reports "some" balance decline over the past few years without  falls.     Hand Dominance        Extremity/Trunk Assessment   Upper Extremity Assessment Upper Extremity Assessment:  Defer to OT evaluation    Lower Extremity Assessment Lower Extremity Assessment: Overall WFL for tasks assessed;LLE deficits/detail;RLE deficits/detail RLE Deficits / Details: AROM WNL, hip, knee and ankle 4+/5, denies numbness/tingling RLE Sensation: WNL RLE Coordination: WNL LLE Deficits / Details: AROM WNL, hip and knee 4/5, ankle 4+/5 denies numbness/tingling LLE Sensation: WNL LLE Coordination: WNL    Cervical / Trunk Assessment Cervical / Trunk Assessment: Normal  Communication   Communication: No difficulties  Cognition Arousal/Alertness: Awake/alert Behavior During Therapy: WFL for tasks assessed/performed Overall Cognitive Status: Within Functional Limits for tasks assessed       General Comments      Exercises     Assessment/Plan    PT Assessment All further PT needs can be met in the next venue of care  PT Problem List Decreased activity tolerance;Decreased balance       PT Treatment Interventions      PT Goals (Current goals can be found in the Care Plan section)  Acute Rehab PT Goals Patient Stated Goal: go to outpatient PT for high level balance PT Goal Formulation: With patient Time For Goal Achievement: 03/04/20 Potential to Achieve Goals: Good    Frequency     Barriers to discharge        Co-evaluation               AM-PAC PT "6 Clicks" Mobility  Outcome Measure Help needed turning from your back to your side while in a flat bed without using bedrails?: None Help needed moving from lying on your back to sitting on the side of a flat bed without using bedrails?: None Help needed moving to and from a bed to a chair (including a wheelchair)?: None Help needed standing up from a chair using your arms (e.g., wheelchair or bedside chair)?: None Help needed to walk in hospital room?: None Help needed climbing 3-5 steps with a railing? : None 6 Click Score: 24    End of Session   Activity Tolerance: Patient tolerated treatment  well Patient left: in chair;with call bell/phone within reach Nurse Communication: Mobility status PT Visit Diagnosis: Other symptoms and signs involving the nervous system (T46.568)    Time: 1275-1700 PT Time Calculation (min) (ACUTE ONLY): 15 min   Charges:   PT Evaluation $PT Eval Low Complexity: 1 Low           Tori Harnoor Reta PT, DPT 03/04/20, 9:38 AM

## 2020-03-04 NOTE — Discharge Summary (Signed)
Physician Discharge Summary  JONH MCQUEARY ZOX:[id number] DOB: [birth year] DOA: 03/03/2020  PCP: Suzan Slick, MD  Admit date: 03/03/2020 Discharge date: 03/04/2020  Admitted From: Home Disposition:  Home  Recommendations for Outpatient Follow-up:  1. Follow up with PCP in 1-2 weeks 2. Please obtain BMP/CBC in one week   Discharge Condition: Stable CODE STATUS: fULL Diet recommendation: Heart Healthy / Carb Modified   Brief/Interim Summary: 80 year old male with a history of persistent atrial fibrillation status post ablation September 2020 on warfarin, provoked PE, hypertension, coronary disease, CKD stage III, hyperlipidemia presenting with right facial numbness and right arm dysesthesias and weakness that he noted when he woke up in the morning of 03/03/2020.  The patient was normal when he went to bed on the evening of 03/02/2020.  In addition, the patient had an ED visit on 02/28/2020 for what he described as a weak grip in his right thumb and difficulty extending his thumb.  X-ray of his thumb showed arthritis, and the patient was discharged in stable condition at that time.  The patient denied any headache when he woke up.  There is no visual disturbance, dysarthria, or other focal extremity weakness.  He has not had any dizziness, syncope, fevers, chills, chest pain, shortness breath, nausea, vomiting, diarrhea, abdominal pain. In the emergency department, the patient was afebrile and hemodynamically stable with oxygen saturation 95% on room air.  BMP was unremarkable with serum creatinine of 1.41 which is at his baseline.  LFTs were unremarkable.  WBC 7.3, hemoglobin 16.1, platelets 212,000.  INR 1.7 EKG shows sinus rhythm with RSR prime.  MRI of the brain showed several small acute infarcts in the left MCA territory in the left parietal and frontoparietal regions.  CTA head and neck showed 50% stenosis of the left extracranial ICA.  There is proximal right ICA short segment dissection  versus web.  There was no LVO.  Teleneurology was consulted and recommended aspirin and Plavix.  The patient was admitted for further evaluation and treatment.  Discharge Diagnoses:  Acute ischemic stroke -Neurology Consult -symptoms improved, patient back to baseline -PT/OT evaluation-->outpatient PT -Speech therapy eval -CT brain--neg for acute finding -MRI brain--several small acute infarcts in the left MCA territory in the left parietal and frontoparietal regions -MRA brain--no LVO; mod stenosis proximal M2 R-MCA -CTA H&N--no LVO; 50% L-ICA stenosis; proximal right ICA short segment dissection versus web. -Echo--EF 55-60%, no PFO -LDL--63 -HbA1C--pending -Antiplatelet--ASA 81+Plavix x 90 days then ASA alone -Holding chlorthalidone and decrease dose of carvedilol to allow for permissive hypertension -patient stated he did not want to wait for neurology consultation and would prefer to follow up as outpatient.  Persistent atrial fibrillation -Status post ablation September 2020 -Personally reviewed EKG--sinus rhythm, RSR'; no ST-T wave changes -Continue warfarin -Continue carvedilol  Chronic systolic and diastolic CHF -Clinically euvolemic -07/19/2019 echo EF 50-55%, grade 1 DD, trivial MR,severe akinesis of the left ventricular, mid-apical anteroseptal wall  and apical segment  -Continue carvedilol at lower dose this morning  Essential hypertension -Holding chlorthalidone and decrease dose of carvedilol to allow for permissive hypertension  CKD stage IIIa -Baseline creatinine 1.4-1.5  Coronary artery disease status post CABG -No chest pain presently -Continue carvedilol -Continue antiplatelet therapy as discussed above  Hypothyroidism -Continue Synthroid  Hyperlipidemia -Continue Lipitor -LDL 63     Discharge Instructions  Discharge Instructions    Ambulatory referral to Neurology   Complete by: As directed    Refer to [facility] for stroke  follow up  Allergies as of 03/04/2020      Reactions   Azithromycin Nausea And Vomiting   "throws for a loop"   Oxycodone Other (See Comments)   nightmares      Medication List    STOP taking these medications   diltiazem 30 MG tablet Commonly known as: CARDIZEM   nitroGLYCERIN 0.4 MG SL tablet Commonly known as: NITROSTAT     TAKE these medications   allopurinol 100 MG tablet Commonly known as: ZYLOPRIM Take 100 mg by mouth 2 (two) times daily.   aspirin EC 81 MG tablet Take 81 mg by mouth daily.   atorvastatin 40 MG tablet Commonly known as: LIPITOR Take 1 tablet (40 mg total) by mouth daily at 6 PM.   calcium carbonate 500 MG chewable tablet Commonly known as: TUMS - dosed in mg elemental calcium Chew 2 tablets by mouth as needed for indigestion or heartburn.   carvedilol 12.5 MG tablet Commonly known as: COREG TAKE ONE TABLET BY MOUTH TWICE DAILY. What changed: when to take this   chlorthalidone 25 MG tablet Commonly known as: HYGROTON TAKE ONE TABLET BY MOUTH DAILY.   CLEAR EYES OP Place 1 drop into both eyes daily as needed (irritation).   clopidogrel 75 MG tablet Commonly known as: PLAVIX Take 1 tablet (75 mg total) by mouth daily. Start taking on: March 05, 2020   Colcrys 0.6 MG tablet Generic drug: colchicine Take 0.6 mg by mouth as needed (gout).   fluticasone 50 MCG/ACT nasal spray Commonly known as: FLONASE Place 1 spray into both nostrils daily as needed for allergies or rhinitis.   levothyroxine 50 MCG tablet Commonly known as: SYNTHROID Take 50 mcg by mouth daily before breakfast.   loratadine 10 MG tablet Commonly known as: CLARITIN Take 10 mg by mouth daily.   montelukast 10 MG tablet Commonly known as: SINGULAIR Take 10 mg by mouth at bedtime.   multivitamin per tablet Take 1 tablet by mouth daily.   potassium citrate 10 MEQ (1080 MG) SR tablet Commonly known as: UROCIT-K Take 10 mEq by mouth in the morning and at  bedtime.   Vitamin D3 50 MCG (2000 UT) Tabs Take 1 tablet by mouth every other day.   warfarin 5 MG tablet Commonly known as: COUMADIN Take as directed. If you are unsure how to take this medication, talk to your nurse or doctor. Original instructions: Take 1 - 2 tablets daily or as directed What changed:   how much to take  how to take this  when to take this  additional instructions       Allergies  Allergen Reactions  . Azithromycin Nausea And Vomiting    "throws for a loop"  . Oxycodone Other (See Comments)    nightmares    Consultations:  neurology   Procedures/Studies: CT ANGIO HEAD W OR WO CONTRAST  Result Date: 03/03/2020 CLINICAL DATA:  Neuro deficit, acute, stroke suspected Stroke/TIA, assess arteries; EXAM: CT ANGIOGRAPHY HEAD AND NECK TECHNIQUE: Multidetector CT imaging of the head and neck was performed using the standard protocol during bolus administration of intravenous contrast. Multiplanar CT image reconstructions and MIPs were obtained to evaluate the vascular anatomy. Carotid stenosis measurements (when applicable) are obtained utilizing NASCET criteria, using the distal internal carotid diameter as the denominator. CONTRAST:  52mL OMNIPAQUE IOHEXOL 350 MG/ML SOLN COMPARISON:  03/03/2020 FINDINGS: CTA NECK FINDINGS Aortic arch: Mild aortic arch atheromatous disease. Aberrant right subclavian artery. Patent great vessel origins. Small proximal left subclavian artery penetrating  atherosclerotic ulcer. Right carotid system: Patent CCA. Proximal ICA short-segment dissection versus web. Minimal bifurcation atheromatous disease without significant narrowing. Left carotid system: Patent CCA. Bifurcation calcified and noncalcified atheromatous plaque. 50% proximal ICA narrowing. Vertebral arteries: Codominant. Mild left V1/V2 segment narrowing. Mild right V1 segment narrowing. Skeleton: No acute or suspicious osseous abnormalities. Multilevel spondylosis and post  sternotomy sequela. Other neck: No adenopathy.  No soft tissue mass. Upper chest: Atelectasis.  Right upper lung calcified granuloma. Review of the MIP images confirms the above findings CTA HEAD FINDINGS Anterior circulation: Bilateral carotid siphon atherosclerotic calcifications with mild left cavernous/supraclinoid ICA narrowing. Patent ACAs. Patent MCAs. Posterior circulation: Patent V4 segments and PICA. Patent basilar artery, terminating as the left PCA, also patent. Fetal origin of the right PCA., patent. Venous sinuses: As permitted by contrast timing, patent. Anatomic variants: Please see above. Review of the MIP images confirms the above findings IMPRESSION: CTA neck: 50% proximal left ICA narrowing. Proximal right ICA short-segment dissection versus web. Mild bilateral V1, left V2 and proximal left subclavian artery narrowing. CTA head: Mild left cavernous/supraclinoid ICA narrowing. No large vessel occlusion or high-grade narrowing. Electronically Signed   By: Stana Bunting M.D.   On: 03/03/2020 16:27   CT Head Wo Contrast  Result Date: 03/03/2020 CLINICAL DATA:  Headache, new or worsening. Additional history provided: Patient reports right-sided facial numbness and headache which began this morning. EXAM: CT HEAD WITHOUT CONTRAST TECHNIQUE: Contiguous axial images were obtained from the base of the skull through the vertex without intravenous contrast. COMPARISON:  No pertinent prior exams available for comparison. FINDINGS: Brain: Mild cerebral atrophy. There is no acute intracranial hemorrhage. No demarcated cortical infarct. No extra-axial fluid collection. No evidence of intracranial mass. No midline shift. Vascular: No hyperdense vessel.  Atherosclerotic calcifications. Skull: No calvarial fracture. Nonspecific smooth thinning of the right parietal calvarium. Sinuses/Orbits: Visualized orbits show no acute finding. Trace bilateral ethmoid and maxillary sinus mucosal thickening at the  imaged levels. Other: Debris within the right external auditory canal. Trace right mastoid effusion. IMPRESSION: No evidence of acute intracranial abnormality. Mild cerebral atrophy. Trace right mastoid effusion. Electronically Signed   By: Jackey Loge DO   On: 03/03/2020 10:51   CT ANGIO NECK W OR WO CONTRAST  Result Date: 03/03/2020 CLINICAL DATA:  Neuro deficit, acute, stroke suspected Stroke/TIA, assess arteries; EXAM: CT ANGIOGRAPHY HEAD AND NECK TECHNIQUE: Multidetector CT imaging of the head and neck was performed using the standard protocol during bolus administration of intravenous contrast. Multiplanar CT image reconstructions and MIPs were obtained to evaluate the vascular anatomy. Carotid stenosis measurements (when applicable) are obtained utilizing NASCET criteria, using the distal internal carotid diameter as the denominator. CONTRAST:  75mL OMNIPAQUE IOHEXOL 350 MG/ML SOLN COMPARISON:  03/03/2020 FINDINGS: CTA NECK FINDINGS Aortic arch: Mild aortic arch atheromatous disease. Aberrant right subclavian artery. Patent great vessel origins. Small proximal left subclavian artery penetrating atherosclerotic ulcer. Right carotid system: Patent CCA. Proximal ICA short-segment dissection versus web. Minimal bifurcation atheromatous disease without significant narrowing. Left carotid system: Patent CCA. Bifurcation calcified and noncalcified atheromatous plaque. 50% proximal ICA narrowing. Vertebral arteries: Codominant. Mild left V1/V2 segment narrowing. Mild right V1 segment narrowing. Skeleton: No acute or suspicious osseous abnormalities. Multilevel spondylosis and post sternotomy sequela. Other neck: No adenopathy.  No soft tissue mass. Upper chest: Atelectasis.  Right upper lung calcified granuloma. Review of the MIP images confirms the above findings CTA HEAD FINDINGS Anterior circulation: Bilateral carotid siphon atherosclerotic calcifications with mild left cavernous/supraclinoid ICA  narrowing.  Patent ACAs. Patent MCAs. Posterior circulation: Patent V4 segments and PICA. Patent basilar artery, terminating as the left PCA, also patent. Fetal origin of the right PCA., patent. Venous sinuses: As permitted by contrast timing, patent. Anatomic variants: Please see above. Review of the MIP images confirms the above findings IMPRESSION: CTA neck: 50% proximal left ICA narrowing. Proximal right ICA short-segment dissection versus web. Mild bilateral V1, left V2 and proximal left subclavian artery narrowing. CTA head: Mild left cavernous/supraclinoid ICA narrowing. No large vessel occlusion or high-grade narrowing. Electronically Signed   By: Stana Buntinghikanele  Emekauwa M.D.   On: 03/03/2020 16:27   MR ANGIO HEAD WO CONTRAST  Result Date: 03/03/2020 CLINICAL DATA:  Stroke suspected. Additional history provided: Patient reports right arm weakness and right-sided facial numbness, slight headache. EXAM: MRI HEAD WITHOUT CONTRAST MRA HEAD WITHOUT CONTRAST TECHNIQUE: Multiplanar, multiecho pulse sequences of the brain and surrounding structures were obtained without intravenous contrast. Angiographic images of the head were obtained using MRA technique without contrast. COMPARISON:  Noncontrast head CT performed earlier today 03/03/2020. FINDINGS: MRI HEAD FINDINGS Brain: Mild intermittent motion degradation. Mild cerebral atrophy. There are multiple small acute left MCA territory infarcts measuring up to 10 mm, as follows. Acute cortical infarct within the left frontal lobe motor strip (series 5, image 30). Adjacent acute cortical infarct within the high posterior left frontal lobe (series 5, image 31). Acute infarct within the posterior left frontal lobe white matter (series 5, image 27). Acute infarct within the left caudate nucleus (series 5, image 22). Suspected additional tiny acute cortical infarct within the inferolateral left parietal lobe (series 5, image 22). Minimal background multifocal T2/FLAIR hyperintensity  within the cerebral white matter is nonspecific, but compatible with chronic small vessel ischemic disease. Subcentimeter chronic infarct within the right cerebellum. No evidence of intracranial mass. No chronic intracranial blood products. No extra-axial fluid collection. No midline shift. Vascular: Reported below. Skull and upper cervical spine: No focal marrow lesion. Incompletely assessed cervical spondylosis. Sinuses/Orbits: Visualized orbits show no acute finding. Trace bilateral ethmoid sinus mucosal thickening. MRA HEAD FINDINGS The intracranial internal carotid arteries are patent. The M1 middle cerebral arteries are patent. Multiple severe stenoses within both superior and inferior division proximal and mid M2 left MCA branch vessels (for instance as seen on series 1043, image 5 and image 12). Moderate stenosis within a superior division proximal M2 right MCA branch vessel. The anterior cerebral arteries are patent. No intracranial aneurysm is identified. The intracranial vertebral arteries are patent. The basilar artery is patent. The posterior cerebral arteries are patent. The posterior cerebral arteries are patent. Apparent moderate/severe stenosis within the mid to distal P2 segment of the right posterior cerebral artery. IMPRESSION: MRI brain: 1. 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 Notably, one of these infarcts affects the left frontal lobe motor strip. 2. Background mild cerebral atrophy and chronic small vessel ischemic disease. 3. Subcentimeter chronic infarct within the right cerebellar hemisphere. MRA head: 1. No intracranial large vessel occlusion is identified. However, there are multiple severe stenoses within both superior and inferior division proximal and mid M2 left middle cerebral artery branch vessels. 2. Moderate stenosis within a superior division proximal M2  right MCA branch vessel. 3. Apparent moderate/severe stenosis of the P2 right posterior cerebral artery. Electronically Signed   By: Jackey LogeKyle  Golden DO   On: 03/03/2020 14:20   MR  Brain Wo Contrast (neuro protocol)  Result Date: 03/03/2020 CLINICAL DATA:  Stroke suspected. Additional history provided: Patient reports right arm weakness and right-sided facial numbness, slight headache. EXAM: MRI HEAD WITHOUT CONTRAST MRA HEAD WITHOUT CONTRAST TECHNIQUE: Multiplanar, multiecho pulse sequences of the brain and surrounding structures were obtained without intravenous contrast. Angiographic images of the head were obtained using MRA technique without contrast. COMPARISON:  Noncontrast head CT performed earlier today 03/03/2020. FINDINGS: MRI HEAD FINDINGS Brain: Mild intermittent motion degradation. Mild cerebral atrophy. There are multiple small acute left MCA territory infarcts measuring up to 10 mm, as follows. Acute cortical infarct within the left frontal lobe motor strip (series 5, image 30). Adjacent acute cortical infarct within the high posterior left frontal lobe (series 5, image 31). Acute infarct within the posterior left frontal lobe white matter (series 5, image 27). Acute infarct within the left caudate nucleus (series 5, image 22). Suspected additional tiny acute cortical infarct within the inferolateral left parietal lobe (series 5, image 22). Minimal background multifocal T2/FLAIR hyperintensity within the cerebral white matter is nonspecific, but compatible with chronic small vessel ischemic disease. Subcentimeter chronic infarct within the right cerebellum. No evidence of intracranial mass. No chronic intracranial blood products. No extra-axial fluid collection. No midline shift. Vascular: Reported below. Skull and upper cervical spine: No focal marrow lesion. Incompletely assessed cervical spondylosis. Sinuses/Orbits: Visualized orbits show no acute finding. Trace bilateral ethmoid sinus mucosal  thickening. MRA HEAD FINDINGS The intracranial internal carotid arteries are patent. The M1 middle cerebral arteries are patent. Multiple severe stenoses within both superior and inferior division proximal and mid M2 left MCA branch vessels (for instance as seen on series 1043, image 5 and image 12). Moderate stenosis within a superior division proximal M2 right MCA branch vessel. The anterior cerebral arteries are patent. No intracranial aneurysm is identified. The intracranial vertebral arteries are patent. The basilar artery is patent. The posterior cerebral arteries are patent. The posterior cerebral arteries are patent. Apparent moderate/severe stenosis within the mid to distal P2 segment of the right posterior cerebral artery. IMPRESSION: MRI brain: 1. 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 Notably, one of these infarcts affects the left frontal lobe motor strip. 2. Background mild cerebral atrophy and chronic small vessel ischemic disease. 3. Subcentimeter chronic infarct within the right cerebellar hemisphere. MRA head: 1. No intracranial large vessel occlusion is identified. However, there are multiple severe stenoses within both superior and inferior division proximal and mid M2 left middle cerebral artery branch vessels. 2. Moderate stenosis within a superior division proximal M2 right MCA branch vessel. 3. Apparent moderate/severe stenosis of the P2 right posterior cerebral artery. Electronically Signed   By: Jackey Loge DO   On: 03/03/2020 14:20   DG Hand Complete Right  Result Date: 02/28/2020 CLINICAL DATA:  RIGHT thumb deformity this morning, pain RIGHT thumb EXAM: RIGHT HAND - COMPLETE 3+ VIEW COMPARISON:  None FINDINGS: Osseous demineralization. Scattered joint space narrowing throughout RIGHT hand including multiple IP joints, second third and fourth MCP joints,  radiocarpal joint, and at radial border of carpus. Advanced degenerative changes at distal radioulnar joint. No acute fracture, dislocation or bone destruction. Slight flexion deformity and minimal subluxation at the first MCP joint, which also demonstrates a mild degree of narrowing. No erosive changes. IMPRESSION: Osseous demineralization with scattered degenerative changes in the RIGHT hand including subluxation and degenerative changes  at the first MCP joint. Electronically Signed   By: Ulyses Southward M.D.   On: 02/28/2020 14:36   ECHOCARDIOGRAM COMPLETE  Result Date: 03/04/2020    ECHOCARDIOGRAM REPORT   Patient Name:   MARKEIS ALLMAN Date of Exam: 03/04/2020 Medical Rec #:  [id number]      Height:       70.0 in Accession #:    [id number]     Weight:       210.1 lb Date of Birth:  June 15, 1940      BSA:          2.131 m Patient Age:    80 years       BP:           117/74 mmHg Patient Gender: M              HR:           81 bpm. [facility] Procedure: 2D Echo Indications:    Stroke I63.9  History:        Patient has prior history of Echocardiogram examinations, most                 recent 07/19/2019. Previous Myocardial Infarction, Prior CABG,                 Stroke, Arrythmias:Atrial Fibrillation; Risk                 Factors:Hypertension, Former Smoker and Dyslipidemia. Pulmonary                 Embolism.  Sonographer:    Jeryl Columbia RDCS (AE) Referring Phys: [id number] ASIA B ZIERLE-GHOSH IMPRESSIONS  1. Left ventricular ejection fraction, by estimation, is 55 to 60%. The left ventricle has normal function. The left ventricle has no regional wall motion abnormalities. There is mild left ventricular hypertrophy. Left ventricular diastolic parameters are consistent with Grade I diastolic dysfunction (impaired relaxation).  2. Right ventricular systolic function is normal. The right ventricular size is normal.  3. The mitral valve is normal in structure. Trivial mitral valve regurgitation. No evidence  of mitral stenosis.  4. The aortic valve is tricuspid. There is moderate calcification of the aortic valve. There is moderate thickening of the aortic valve. Aortic valve regurgitation is mild.  5. The inferior vena cava is normal in size with greater than 50% respiratory variability, suggesting right atrial pressure of 3 mmHg. FINDINGS  Left Ventricle: Left ventricular ejection fraction, by estimation, is 55 to 60%. The left ventricle has normal function. The left ventricle has no regional wall motion abnormalities. The left ventricular internal cavity size was normal in size. There is  mild left ventricular hypertrophy. Left ventricular diastolic parameters are consistent with Grade I diastolic dysfunction (impaired relaxation). Normal left ventricular filling pressure. Right Ventricle: The right ventricular size is normal. Right vetricular wall thickness was not assessed. Right ventricular systolic function is normal. Left Atrium: Left atrial size was normal in size. Right Atrium: Right atrial size was normal in size. Pericardium: There is no evidence of pericardial effusion. Mitral Valve: The mitral valve is normal in structure. There is mild thickening of the mitral valve leaflet(s). There is mild calcification of the mitral valve leaflet(s). Mild mitral annular calcification. Trivial mitral valve regurgitation. No evidence  of mitral valve stenosis. Tricuspid Valve: The tricuspid valve is normal in structure. Tricuspid valve regurgitation is not demonstrated. No evidence of tricuspid stenosis. Aortic Valve: The aortic valve is tricuspid. There  is moderate calcification of the aortic valve. There is moderate thickening of the aortic valve. There is moderate aortic valve annular calcification. Aortic valve regurgitation is mild. Aortic regurgitation PHT measures 458 msec. Aortic valve mean gradient measures 6.4 mmHg. Aortic valve peak gradient measures 12.0 mmHg. Aortic valve area, by VTI measures 2.71 cm.  Pulmonic Valve: The pulmonic valve was not well visualized. Pulmonic valve regurgitation is trivial. No evidence of pulmonic stenosis. Aorta: The aortic root is normal in size and structure. Pulmonary Artery: Indeterminant PASP, inadequate TR jet. Venous: The inferior vena cava is normal in size with greater than 50% respiratory variability, suggesting right atrial pressure of 3 mmHg. IAS/Shunts: No atrial level shunt detected by color flow Doppler.  LEFT VENTRICLE PLAX 2D LVIDd:         3.98 cm  Diastology LVIDs:         2.82 cm  LV e' medial:    6.42 cm/s LV PW:         1.20 cm  LV E/e' medial:  8.3 LV IVS:        1.24 cm  LV e' lateral:   7.62 cm/s LVOT diam:     2.20 cm  LV E/e' lateral: 7.0 LV SV:         91 LV SV Index:   43 LVOT Area:     3.80 cm  RIGHT VENTRICLE RV S prime:     12.30 cm/s TAPSE (M-mode): 1.7 cm LEFT ATRIUM             Index       RIGHT ATRIUM           Index LA diam:        3.30 cm 1.55 cm/m  RA Area:     18.60 cm LA Vol (A2C):   54.1 ml 25.38 ml/m RA Volume:   52.80 ml  24.77 ml/m LA Vol (A4C):   39.2 ml 18.39 ml/m LA Biplane Vol: 47.7 ml 22.38 ml/m  AORTIC VALVE AV Area (Vmax):    2.58 cm AV Area (Vmean):   2.59 cm AV Area (VTI):     2.71 cm AV Vmax:           173.27 cm/s AV Vmean:          116.724 cm/s AV VTI:            0.334 m AV Peak Grad:      12.0 mmHg AV Mean Grad:      6.4 mmHg LVOT Vmax:         117.40 cm/s LVOT Vmean:        79.661 cm/s LVOT VTI:          0.238 m LVOT/AV VTI ratio: 0.71 AI PHT:            458 msec  AORTA Ao Root diam: 3.60 cm MITRAL VALVE MV Area (PHT): 2.48 cm    SHUNTS MV Decel Time: 306 msec    Systemic VTI:  0.24 m MV E velocity: 53.10 cm/s  Systemic Diam: 2.20 cm MV A velocity: 78.80 cm/s MV E/A ratio:  0.67 Dina Rich MD Electronically signed by Dina Rich MD Signature Date/Time: 03/04/2020/11:52:58 AM    Final          Discharge Exam: Vitals:   03/04/20 0840 03/04/20 1342  BP: 117/74 111/82  Pulse: 81 86  Resp: 16   Temp:  97.9 F (36.6 C)   SpO2: 94% 94%   Vitals:  03/04/20 0438 03/04/20 0630 03/04/20 0840 03/04/20 1342  BP: 105/78 113/80 117/74 111/82  Pulse: 82 85 81 86  Resp: 18 16 16    Temp: 98.1 F (36.7 C) 97.8 F (36.6 C) 97.9 F (36.6 C)   TempSrc:  Oral Oral   SpO2: 92% 92% 94% 94%  Weight:      Height:        General: Pt is alert, awake, not in acute distress Cardiovascular: RRR, S1/S2 +, no rubs, no gallops Respiratory: CTA bilaterally, no wheezing, no rhonchi Abdominal: Soft, NT, ND, bowel sounds + Extremities: no edema, no cyanosis   The results of significant diagnostics from this hospitalization (including imaging, microbiology, ancillary and laboratory) are listed below for reference.    Significant Diagnostic Studies: CT ANGIO HEAD W OR WO CONTRAST  Result Date: 03/03/2020 CLINICAL DATA:  Neuro deficit, acute, stroke suspected Stroke/TIA, assess arteries; EXAM: CT ANGIOGRAPHY HEAD AND NECK TECHNIQUE: Multidetector CT imaging of the head and neck was performed using the standard protocol during bolus administration of intravenous contrast. Multiplanar CT image reconstructions and MIPs were obtained to evaluate the vascular anatomy. Carotid stenosis measurements (when applicable) are obtained utilizing NASCET criteria, using the distal internal carotid diameter as the denominator. CONTRAST:  75mL OMNIPAQUE IOHEXOL 350 MG/ML SOLN COMPARISON:  03/03/2020 FINDINGS: CTA NECK FINDINGS Aortic arch: Mild aortic arch atheromatous disease. Aberrant right subclavian artery. Patent great vessel origins. Small proximal left subclavian artery penetrating atherosclerotic ulcer. Right carotid system: Patent CCA. Proximal ICA short-segment dissection versus web. Minimal bifurcation atheromatous disease without significant narrowing. Left carotid system: Patent CCA. Bifurcation calcified and noncalcified atheromatous plaque. 50% proximal ICA narrowing. Vertebral arteries: Codominant. Mild left V1/V2  segment narrowing. Mild right V1 segment narrowing. Skeleton: No acute or suspicious osseous abnormalities. Multilevel spondylosis and post sternotomy sequela. Other neck: No adenopathy.  No soft tissue mass. Upper chest: Atelectasis.  Right upper lung calcified granuloma. Review of the MIP images confirms the above findings CTA HEAD FINDINGS Anterior circulation: Bilateral carotid siphon atherosclerotic calcifications with mild left cavernous/supraclinoid ICA narrowing. Patent ACAs. Patent MCAs. Posterior circulation: Patent V4 segments and PICA. Patent basilar artery, terminating as the left PCA, also patent. Fetal origin of the right PCA., patent. Venous sinuses: As permitted by contrast timing, patent. Anatomic variants: Please see above. Review of the MIP images confirms the above findings IMPRESSION: CTA neck: 50% proximal left ICA narrowing. Proximal right ICA short-segment dissection versus web. Mild bilateral V1, left V2 and proximal left subclavian artery narrowing. CTA head: Mild left cavernous/supraclinoid ICA narrowing. No large vessel occlusion or high-grade narrowing. Electronically Signed   By: Stana Bunting M.D.   On: 03/03/2020 16:27   CT Head Wo Contrast  Result Date: 03/03/2020 CLINICAL DATA:  Headache, new or worsening. Additional history provided: Patient reports right-sided facial numbness and headache which began this morning. EXAM: CT HEAD WITHOUT CONTRAST TECHNIQUE: Contiguous axial images were obtained from the base of the skull through the vertex without intravenous contrast. COMPARISON:  No pertinent prior exams available for comparison. FINDINGS: Brain: Mild cerebral atrophy. There is no acute intracranial hemorrhage. No demarcated cortical infarct. No extra-axial fluid collection. No evidence of intracranial mass. No midline shift. Vascular: No hyperdense vessel.  Atherosclerotic calcifications. Skull: No calvarial fracture. Nonspecific smooth thinning of the right parietal  calvarium. Sinuses/Orbits: Visualized orbits show no acute finding. Trace bilateral ethmoid and maxillary sinus mucosal thickening at the imaged levels. Other: Debris within the right external auditory canal. Trace right mastoid effusion. IMPRESSION: No  evidence of acute intracranial abnormality. Mild cerebral atrophy. Trace right mastoid effusion. Electronically Signed   By: Jackey Loge DO   On: 03/03/2020 10:51   CT ANGIO NECK W OR WO CONTRAST  Result Date: 03/03/2020 CLINICAL DATA:  Neuro deficit, acute, stroke suspected Stroke/TIA, assess arteries; EXAM: CT ANGIOGRAPHY HEAD AND NECK TECHNIQUE: Multidetector CT imaging of the head and neck was performed using the standard protocol during bolus administration of intravenous contrast. Multiplanar CT image reconstructions and MIPs were obtained to evaluate the vascular anatomy. Carotid stenosis measurements (when applicable) are obtained utilizing NASCET criteria, using the distal internal carotid diameter as the denominator. CONTRAST:  12mL OMNIPAQUE IOHEXOL 350 MG/ML SOLN COMPARISON:  03/03/2020 FINDINGS: CTA NECK FINDINGS Aortic arch: Mild aortic arch atheromatous disease. Aberrant right subclavian artery. Patent great vessel origins. Small proximal left subclavian artery penetrating atherosclerotic ulcer. Right carotid system: Patent CCA. Proximal ICA short-segment dissection versus web. Minimal bifurcation atheromatous disease without significant narrowing. Left carotid system: Patent CCA. Bifurcation calcified and noncalcified atheromatous plaque. 50% proximal ICA narrowing. Vertebral arteries: Codominant. Mild left V1/V2 segment narrowing. Mild right V1 segment narrowing. Skeleton: No acute or suspicious osseous abnormalities. Multilevel spondylosis and post sternotomy sequela. Other neck: No adenopathy.  No soft tissue mass. Upper chest: Atelectasis.  Right upper lung calcified granuloma. Review of the MIP images confirms the above findings CTA HEAD  FINDINGS Anterior circulation: Bilateral carotid siphon atherosclerotic calcifications with mild left cavernous/supraclinoid ICA narrowing. Patent ACAs. Patent MCAs. Posterior circulation: Patent V4 segments and PICA. Patent basilar artery, terminating as the left PCA, also patent. Fetal origin of the right PCA., patent. Venous sinuses: As permitted by contrast timing, patent. Anatomic variants: Please see above. Review of the MIP images confirms the above findings IMPRESSION: CTA neck: 50% proximal left ICA narrowing. Proximal right ICA short-segment dissection versus web. Mild bilateral V1, left V2 and proximal left subclavian artery narrowing. CTA head: Mild left cavernous/supraclinoid ICA narrowing. No large vessel occlusion or high-grade narrowing. Electronically Signed   By: Stana Bunting M.D.   On: 03/03/2020 16:27   MR ANGIO HEAD WO CONTRAST  Result Date: 03/03/2020 CLINICAL DATA:  Stroke suspected. Additional history provided: Patient reports right arm weakness and right-sided facial numbness, slight headache. EXAM: MRI HEAD WITHOUT CONTRAST MRA HEAD WITHOUT CONTRAST TECHNIQUE: Multiplanar, multiecho pulse sequences of the brain and surrounding structures were obtained without intravenous contrast. Angiographic images of the head were obtained using MRA technique without contrast. COMPARISON:  Noncontrast head CT performed earlier today 03/03/2020. FINDINGS: MRI HEAD FINDINGS Brain: Mild intermittent motion degradation. Mild cerebral atrophy. There are multiple small acute left MCA territory infarcts measuring up to 10 mm, as follows. Acute cortical infarct within the left frontal lobe motor strip (series 5, image 30). Adjacent acute cortical infarct within the high posterior left frontal lobe (series 5, image 31). Acute infarct within the posterior left frontal lobe white matter (series 5, image 27). Acute infarct within the left caudate nucleus (series 5, image 22). Suspected additional tiny  acute cortical infarct within the inferolateral left parietal lobe (series 5, image 22). Minimal background multifocal T2/FLAIR hyperintensity within the cerebral white matter is nonspecific, but compatible with chronic small vessel ischemic disease. Subcentimeter chronic infarct within the right cerebellum. No evidence of intracranial mass. No chronic intracranial blood products. No extra-axial fluid collection. No midline shift. Vascular: Reported below. Skull and upper cervical spine: No focal marrow lesion. Incompletely assessed cervical spondylosis. Sinuses/Orbits: Visualized orbits show no acute finding. Trace bilateral ethmoid sinus  mucosal thickening. MRA HEAD FINDINGS The intracranial internal carotid arteries are patent. The M1 middle cerebral arteries are patent. Multiple severe stenoses within both superior and inferior division proximal and mid M2 left MCA branch vessels (for instance as seen on series 1043, image 5 and image 12). Moderate stenosis within a superior division proximal M2 right MCA branch vessel. The anterior cerebral arteries are patent. No intracranial aneurysm is identified. The intracranial vertebral arteries are patent. The basilar artery is patent. The posterior cerebral arteries are patent. The posterior cerebral arteries are patent. Apparent moderate/severe stenosis within the mid to distal P2 segment of the right posterior cerebral artery. IMPRESSION: MRI brain: 1. 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 Notably, one of these infarcts affects the left frontal lobe motor strip. 2. Background mild cerebral atrophy and chronic small vessel ischemic disease. 3. Subcentimeter chronic infarct within the right cerebellar hemisphere. MRA head: 1. No intracranial large vessel occlusion is identified. However, there are multiple severe stenoses within both superior and  inferior division proximal and mid M2 left middle cerebral artery branch vessels. 2. Moderate stenosis within a superior division proximal M2 right MCA branch vessel. 3. Apparent moderate/severe stenosis of the P2 right posterior cerebral artery. Electronically Signed   By: Jackey Loge DO   On: 03/03/2020 14:20   MR Brain Wo Contrast (neuro protocol)  Result Date: 03/03/2020 CLINICAL DATA:  Stroke suspected. Additional history provided: Patient reports right arm weakness and right-sided facial numbness, slight headache. EXAM: MRI HEAD WITHOUT CONTRAST MRA HEAD WITHOUT CONTRAST TECHNIQUE: Multiplanar, multiecho pulse sequences of the brain and surrounding structures were obtained without intravenous contrast. Angiographic images of the head were obtained using MRA technique without contrast. COMPARISON:  Noncontrast head CT performed earlier today 03/03/2020. FINDINGS: MRI HEAD FINDINGS Brain: Mild intermittent motion degradation. Mild cerebral atrophy. There are multiple small acute left MCA territory infarcts measuring up to 10 mm, as follows. Acute cortical infarct within the left frontal lobe motor strip (series 5, image 30). Adjacent acute cortical infarct within the high posterior left frontal lobe (series 5, image 31). Acute infarct within the posterior left frontal lobe white matter (series 5, image 27). Acute infarct within the left caudate nucleus (series 5, image 22). Suspected additional tiny acute cortical infarct within the inferolateral left parietal lobe (series 5, image 22). Minimal background multifocal T2/FLAIR hyperintensity within the cerebral white matter is nonspecific, but compatible with chronic small vessel ischemic disease. Subcentimeter chronic infarct within the right cerebellum. No evidence of intracranial mass. No chronic intracranial blood products. No extra-axial fluid collection. No midline shift. Vascular: Reported below. Skull and upper cervical spine: No focal marrow lesion.  Incompletely assessed cervical spondylosis. Sinuses/Orbits: Visualized orbits show no acute finding. Trace bilateral ethmoid sinus mucosal thickening. MRA HEAD FINDINGS The intracranial internal carotid arteries are patent. The M1 middle cerebral arteries are patent. Multiple severe stenoses within both superior and inferior division proximal and mid M2 left MCA branch vessels (for instance as seen on series 1043, image 5 and image 12). Moderate stenosis within a superior division proximal M2 right MCA branch vessel. The anterior cerebral arteries are patent. No intracranial aneurysm is identified. The intracranial vertebral arteries are patent. The basilar artery is patent. The posterior cerebral arteries are patent. The posterior cerebral arteries are patent. Apparent moderate/severe stenosis within the mid to distal P2 segment of the right posterior  cerebral artery. IMPRESSION: MRI brain: 1. 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 Notably, one of these infarcts affects the left frontal lobe motor strip. 2. Background mild cerebral atrophy and chronic small vessel ischemic disease. 3. Subcentimeter chronic infarct within the right cerebellar hemisphere. MRA head: 1. No intracranial large vessel occlusion is identified. However, there are multiple severe stenoses within both superior and inferior division proximal and mid M2 left middle cerebral artery branch vessels. 2. Moderate stenosis within a superior division proximal M2 right MCA branch vessel. 3. Apparent moderate/severe stenosis of the P2 right posterior cerebral artery. Electronically Signed   By: Jackey Loge DO   On: 03/03/2020 14:20   DG Hand Complete Right  Result Date: 02/28/2020 CLINICAL DATA:  RIGHT thumb deformity this morning, pain RIGHT thumb EXAM: RIGHT HAND - COMPLETE 3+ VIEW COMPARISON:  None FINDINGS: Osseous  demineralization. Scattered joint space narrowing throughout RIGHT hand including multiple IP joints, second third and fourth MCP joints, radiocarpal joint, and at radial border of carpus. Advanced degenerative changes at distal radioulnar joint. No acute fracture, dislocation or bone destruction. Slight flexion deformity and minimal subluxation at the first MCP joint, which also demonstrates a mild degree of narrowing. No erosive changes. IMPRESSION: Osseous demineralization with scattered degenerative changes in the RIGHT hand including subluxation and degenerative changes at the first MCP joint. Electronically Signed   By: Ulyses Southward M.D.   On: 02/28/2020 14:36   ECHOCARDIOGRAM COMPLETE  Result Date: 03/04/2020    ECHOCARDIOGRAM REPORT   Patient Name:   RAMERE DOWNS Date of Exam: 03/04/2020 Medical Rec #:  [id number]      Height:       70.0 in Accession #:    [id number]     Weight:       210.1 lb Date of Birth:  [birth year]      BSA:          2.131 m Patient Age:    80 years       BP:           117/74 mmHg Patient Gender: M              HR:           81 bpm. [facility] Procedure: 2D Echo Indications:    Stroke I63.9  History:        Patient has prior history of Echocardiogram examinations, most                 recent 07/19/2019. Previous Myocardial Infarction, Prior CABG,                 Stroke, Arrythmias:Atrial Fibrillation; Risk                 Factors:Hypertension, Former Smoker and Dyslipidemia. Pulmonary                 Embolism.  Sonographer:    Jeryl Columbia RDCS (AE) Referring Phys: [id number] ASIA B ZIERLE-GHOSH IMPRESSIONS  1. Left ventricular ejection fraction, by estimation, is 55 to 60%. The left ventricle has normal function. The left ventricle has no regional wall motion abnormalities. There is mild left ventricular hypertrophy. Left ventricular diastolic parameters are consistent with Grade I diastolic dysfunction (impaired relaxation).  2. Right ventricular systolic function is  normal. The right ventricular size is normal.  3.  The mitral valve is normal in structure. Trivial mitral valve regurgitation. No evidence of mitral stenosis.  4. The aortic valve is tricuspid. There is moderate calcification of the aortic valve. There is moderate thickening of the aortic valve. Aortic valve regurgitation is mild.  5. The inferior vena cava is normal in size with greater than 50% respiratory variability, suggesting right atrial pressure of 3 mmHg. FINDINGS  Left Ventricle: Left ventricular ejection fraction, by estimation, is 55 to 60%. The left ventricle has normal function. The left ventricle has no regional wall motion abnormalities. The left ventricular internal cavity size was normal in size. There is  mild left ventricular hypertrophy. Left ventricular diastolic parameters are consistent with Grade I diastolic dysfunction (impaired relaxation). Normal left ventricular filling pressure. Right Ventricle: The right ventricular size is normal. Right vetricular wall thickness was not assessed. Right ventricular systolic function is normal. Left Atrium: Left atrial size was normal in size. Right Atrium: Right atrial size was normal in size. Pericardium: There is no evidence of pericardial effusion. Mitral Valve: The mitral valve is normal in structure. There is mild thickening of the mitral valve leaflet(s). There is mild calcification of the mitral valve leaflet(s). Mild mitral annular calcification. Trivial mitral valve regurgitation. No evidence  of mitral valve stenosis. Tricuspid Valve: The tricuspid valve is normal in structure. Tricuspid valve regurgitation is not demonstrated. No evidence of tricuspid stenosis. Aortic Valve: The aortic valve is tricuspid. There is moderate calcification of the aortic valve. There is moderate thickening of the aortic valve. There is moderate aortic valve annular calcification. Aortic valve regurgitation is mild. Aortic regurgitation PHT measures 458 msec.  Aortic valve mean gradient measures 6.4 mmHg. Aortic valve peak gradient measures 12.0 mmHg. Aortic valve area, by VTI measures 2.71 cm. Pulmonic Valve: The pulmonic valve was not well visualized. Pulmonic valve regurgitation is trivial. No evidence of pulmonic stenosis. Aorta: The aortic root is normal in size and structure. Pulmonary Artery: Indeterminant PASP, inadequate TR jet. Venous: The inferior vena cava is normal in size with greater than 50% respiratory variability, suggesting right atrial pressure of 3 mmHg. IAS/Shunts: No atrial level shunt detected by color flow Doppler.  LEFT VENTRICLE PLAX 2D LVIDd:         3.98 cm  Diastology LVIDs:         2.82 cm  LV e' medial:    6.42 cm/s LV PW:         1.20 cm  LV E/e' medial:  8.3 LV IVS:        1.24 cm  LV e' lateral:   7.62 cm/s LVOT diam:     2.20 cm  LV E/e' lateral: 7.0 LV SV:         91 LV SV Index:   43 LVOT Area:     3.80 cm  RIGHT VENTRICLE RV S prime:     12.30 cm/s TAPSE (M-mode): 1.7 cm LEFT ATRIUM             Index       RIGHT ATRIUM           Index LA diam:        3.30 cm 1.55 cm/m  RA Area:     18.60 cm LA Vol (A2C):   54.1 ml 25.38 ml/m RA Volume:   52.80 ml  24.77 ml/m LA Vol (A4C):   39.2 ml 18.39 ml/m LA Biplane Vol: 47.7 ml 22.38 ml/m  AORTIC VALVE AV Area (Vmax):  2.58 cm AV Area (Vmean):   2.59 cm AV Area (VTI):     2.71 cm AV Vmax:           173.27 cm/s AV Vmean:          116.724 cm/s AV VTI:            0.334 m AV Peak Grad:      12.0 mmHg AV Mean Grad:      6.4 mmHg LVOT Vmax:         117.40 cm/s LVOT Vmean:        79.661 cm/s LVOT VTI:          0.238 m LVOT/AV VTI ratio: 0.71 AI PHT:            458 msec  AORTA Ao Root diam: 3.60 cm MITRAL VALVE MV Area (PHT): 2.48 cm    SHUNTS MV Decel Time: 306 msec    Systemic VTI:  0.24 m MV E velocity: 53.10 cm/s  Systemic Diam: 2.20 cm MV A velocity: 78.80 cm/s MV E/A ratio:  0.67 Dina Rich MD Electronically signed by Dina Rich MD Signature Date/Time: 03/04/2020/11:52:58 AM     Final      Microbiology: Recent Results (from the past 240 hour(s))  Resp Panel by RT-PCR (Flu A&B, Covid) Nasopharyngeal Swab     Status: None   Collection Time: 03/03/20  6:17 PM   Specimen: Nasopharyngeal Swab; Nasopharyngeal(NP) swabs in vial transport medium  Result Value Ref Range Status   SARS Coronavirus 2 by RT PCR NEGATIVE NEGATIVE Final    Comment: (NOTE) SARS-CoV-2 target nucleic acids are NOT DETECTED.  The SARS-CoV-2 RNA is generally detectable in upper respiratory specimens during the acute phase of infection. The lowest concentration of SARS-CoV-2 viral copies this assay can detect is 138 copies/mL. A negative result does not preclude SARS-Cov-2 infection and should not be used as the sole basis for treatment or other patient management decisions. A negative result may occur with  improper specimen collection/handling, submission of specimen other than nasopharyngeal swab, presence of viral mutation(s) within the areas targeted by this assay, and inadequate number of viral copies(<138 copies/mL). A negative result must be combined with clinical observations, patient history, and epidemiological information. The expected result is Negative.  Fact Sheet for Patients:  BloggerCourse.com  Fact Sheet for Healthcare Providers:  SeriousBroker.it  This test is no t yet approved or cleared by the [place] FDA and  has been authorized for detection and/or diagnosis of SARS-CoV-2 by FDA under an Emergency Use Authorization (EUA). This EUA will remain  in effect (meaning this test can be used) for the duration of the COVID-19 declaration under Section 564(b)(1) of the Act, 21 U.S.C.section 360bbb-3(b)(1), unless the authorization is terminated  or revoked sooner.       Influenza A by PCR NEGATIVE NEGATIVE Final   Influenza B by PCR NEGATIVE NEGATIVE Final    Comment: (NOTE) The Xpert Xpress SARS-CoV-2/FLU/RSV  plus assay is intended as an aid in the diagnosis of influenza from Nasopharyngeal swab specimens and should not be used as a sole basis for treatment. Nasal washings and aspirates are unacceptable for Xpert Xpress SARS-CoV-2/FLU/RSV testing.  Fact Sheet for Patients: BloggerCourse.com  Fact Sheet for Healthcare Providers: SeriousBroker.it  This test is not yet approved or cleared by the [place] FDA and has been authorized for detection and/or diagnosis of SARS-CoV-2 by FDA under an Emergency Use Authorization (EUA). This EUA will remain in effect (meaning this  test can be used) for the duration of the COVID-19 declaration under Section 564(b)(1) of the Act, 21 U.S.C. section 360bbb-3(b)(1), unless the authorization is terminated or revoked.  Performed at [facility], [address], [place], [place] [address]      Labs: Basic Metabolic Panel: Recent Labs  Lab 03/03/20 1033 03/04/20 0435  NA 136 136  K 3.8 3.8  CL 99 101  CO2 24 22  GLUCOSE 105* 80  BUN 29* 30*  CREATININE 1.41* 1.40*  CALCIUM 9.8 9.2   Liver Function Tests: Recent Labs  Lab 03/03/20 1033 03/04/20 0435  AST 37 45*  ALT 35 38  ALKPHOS 71 62  BILITOT 0.8 1.3*  PROT 8.1 7.4  ALBUMIN 4.2 3.9   No results for input(s): LIPASE, AMYLASE in the last 168 hours. No results for input(s): AMMONIA in the last 168 hours. CBC: Recent Labs  Lab 03/03/20 1033 03/04/20 0435  WBC 7.1 7.3  NEUTROABS 4.1  --   HGB 16.6 16.1  HCT 50.0 48.9  MCV 94.5 94.4  PLT 212 212   Cardiac Enzymes: No results for input(s): CKTOTAL, CKMB, CKMBINDEX, TROPONINI in the last 168 hours. BNP: Invalid input(s): POCBNP CBG: No results for input(s): GLUCAP in the last 168 hours.  Time coordinating discharge:  36 minutes  Signed:  Catarina Hartshorn, DO [facility] Pager: [phone] 03/04/2020, 5:59 PM

## 2020-03-04 NOTE — Progress Notes (Signed)
PROGRESS NOTE  Gregory Davila GQQ:[id number] DOB: 06/09/1940 DOA: 03/03/2020 PCP: Suzan Slick, MD  Brief History:  80 year old male with a history of persistent atrial fibrillation status post ablation September 2020 on warfarin, provoked PE, hypertension, coronary disease, CKD stage III, hyperlipidemia presenting with right facial numbness and right arm dysesthesias and weakness that he noted when he woke up in the morning of 03/03/2020.  The patient was normal when he went to bed on the evening of 03/02/2020.  In addition, the patient had an ED visit on 02/28/2020 for what he described as a weak grip in his right thumb and difficulty extending his thumb.  X-ray of his thumb showed arthritis, and the patient was discharged in stable condition at that time.  The patient denied any headache when he woke up.  There is no visual disturbance, dysarthria, or other focal extremity weakness.  He has not had any dizziness, syncope, fevers, chills, chest pain, shortness breath, nausea, vomiting, diarrhea, abdominal pain. In the emergency department, the patient was afebrile and hemodynamically stable with oxygen saturation 95% on room air.  BMP was unremarkable with serum creatinine of 1.41 which is at his baseline.  LFTs were unremarkable.  WBC 7.3, hemoglobin 16.1, platelets 212,000.  INR 1.7 EKG shows sinus rhythm with RSR prime.  MRI of the brain showed several small acute infarcts in the left MCA territory in the left parietal and frontoparietal regions.  CTA head and neck showed 50% stenosis of the left extracranial ICA.  There is proximal right ICA short segment dissection versus web.  There was no LVO.  Teleneurology was consulted and recommended aspirin and Plavix.  The patient was admitted for further evaluation and treatment.  Assessment/Plan: Acute ischemic stroke -Neurology Consult -PT/OT evaluation -Speech therapy eval -CT brain--neg for acute finding -MRI brain--several small acute  infarcts in the left MCA territory in the left parietal and frontoparietal regions -MRA brain--no LVO; mod stenosis proximal M2 R-MCA -CTA H&N--no LVO; 50% L-ICA stenosis; proximal right ICA short segment dissection versus web. -Echo-- -LDL--63 -HbA1C--pending -Antiplatelet--ASA 325+Plavix -Holding chlorthalidone and decrease dose of carvedilol to allow for permissive hypertension  Persistent atrial fibrillation -Status post ablation September 2020 -Personally reviewed EKG--sinus rhythm, RSR'; no ST-T wave changes -Continue warfarin -Continue carvedilol  Chronic systolic and diastolic CHF -Clinically euvolemic -07/19/2019 echo EF 50-55%, grade 1 DD, trivial MR,severe akinesis of the left ventricular, mid-apical anteroseptal wall  and apical segment  -Continue carvedilol at lower dose this morning  Essential hypertension -Holding chlorthalidone and decrease dose of carvedilol to allow for permissive hypertension  CKD stage IIIa -Baseline creatinine 1.4-1.5  Coronary artery disease status post CABG -No chest pain presently -Continue carvedilol -Continue antiplatelet therapy as discussed above  Hypothyroidism -Continue Synthroid  Hyperlipidemia -Continue Lipitor -LDL 63      Status is: Observation  The patient remains OBS appropriate and will d/c before 2 midnights.  Dispo: The patient is from: Home              Anticipated d/c is to: Home              Patient currently is not medically stable to d/c.   Difficult to place patient No        Family Communication:  no Family at bedside  Consultants:  neurology  Code Status:  FULL  DVT Prophylaxis:  coumadin   Procedures: As Listed in Progress Note Above  Antibiotics: None  Subjective: Patient denies fevers, chills, headache, chest pain, dyspnea, nausea, vomiting, diarrhea, abdominal pain, dysuria, hematuria, hematochezia, and melena.   Objective: Vitals:   03/04/20 0016 03/04/20 0241  03/04/20 0438 03/04/20 0630  BP: 127/85 106/67 105/78 113/80  Pulse: 90 87 82 85  Resp: Temp: 97.8 F (36.6 C) 98.5 F (36.9 C) 98.1 F (36.7 C) 97.8 F (36.6 C)  TempSrc: Oral Oral  Oral  SpO2: 95% 93% 92% 92%  Weight:      Height:       No intake or output data in the 24 hours ending 03/04/20 0719 Weight change:  Exam:   General:  Pt is alert, follows commands appropriately, not in acute distress  HEENT: No icterus, No thrush, No neck mass, /AT  Cardiovascular: RRR, S1/S2, no rubs, no gallops  Respiratory: CTA bilaterally, no wheezing, no crackles, no rhonchi  Abdomen: Soft/+BS, non tender, non distended, no guarding  Extremities: No edema, No lymphangitis, No petechiae, No rashes, no synovitis  Neuro:  CN II-XII intact, strength 4/5 in RUE, RLE, strength 4/5 LUE, LLE; sensation intact bilateral; no dysmetria; babinski equivocal    Data Reviewed: I have personally reviewed following labs and imaging studies Basic Metabolic Panel: Recent Labs  Lab 03/03/20 1033 03/04/20 0435  NA 136 136  K 3.8 3.8  CL 99 101  CO2 24 22  GLUCOSE 105* 80  BUN 29* 30*  CREATININE 1.41* 1.40*  CALCIUM 9.8 9.2   Liver Function Tests: Recent Labs  Lab 03/03/20 1033 03/04/20 0435  AST 37 45*  ALT 35 38  ALKPHOS 71 62  BILITOT 0.8 1.3*  PROT 8.1 7.4  ALBUMIN 4.2 3.9   No results for input(s): LIPASE, AMYLASE in the last 168 hours. No results for input(s): AMMONIA in the last 168 hours. Coagulation Profile: Recent Labs  Lab 03/03/20 1033 03/04/20 0435  INR 1.7* 1.7*   CBC: Recent Labs  Lab 03/03/20 1033 03/04/20 0435  WBC 7.1 7.3  NEUTROABS 4.1  --   HGB 16.6 16.1  HCT 50.0 48.9  MCV 94.5 94.4  PLT 212 212   Cardiac Enzymes: No results for input(s): CKTOTAL, CKMB, CKMBINDEX, TROPONINI in the last 168 hours. BNP: Invalid input(s): POCBNP CBG: No results for input(s): GLUCAP in the last 168 hours. HbA1C: No results for input(s): HGBA1C in  the last 72 hours. Urine analysis:    Component Value Date/Time   COLORURINE YELLOW 02/24/2014 1245   APPEARANCEUR CLEAR 02/24/2014 1245   LABSPEC 1.009 02/24/2014 1245   PHURINE 6.5 02/24/2014 1245   GLUCOSEU NEGATIVE 02/24/2014 1245   HGBUR NEGATIVE 02/24/2014 1245   BILIRUBINUR NEGATIVE 02/24/2014 1245   KETONESUR NEGATIVE 02/24/2014 1245   PROTEINUR NEGATIVE 02/24/2014 1245   UROBILINOGEN 0.2 02/24/2014 1245   NITRITE NEGATIVE 02/24/2014 1245   LEUKOCYTESUR MODERATE (A) 02/24/2014 1245   Sepsis Labs: (procalcitonin:4,lacticidven:4) ) Recent Results (from the past 240 hour(s))  Resp Panel by RT-PCR (Flu A&B, Covid) Nasopharyngeal Swab     Status: None   Collection Time: 03/03/20  6:17 PM   Specimen: Nasopharyngeal Swab; Nasopharyngeal(NP) swabs in vial transport medium  Result Value Ref Range Status   SARS Coronavirus 2 by RT PCR NEGATIVE NEGATIVE Final    Comment: (NOTE) SARS-CoV-2 target nucleic acids are NOT DETECTED.  The SARS-CoV-2 RNA is generally detectable in upper respiratory specimens during the acute phase of infection. The lowest concentration of SARS-CoV-2 viral copies this assay can detect is 138 copies/mL. A negative  result does not preclude SARS-Cov-2 infection and should not be used as the sole basis for treatment or other patient management decisions. A negative result may occur with  improper specimen collection/handling, submission of specimen other than nasopharyngeal swab, presence of viral mutation(s) within the areas targeted by this assay, and inadequate number of viral copies(<138 copies/mL). A negative result must be combined with clinical observations, patient history, and epidemiological information. The expected result is Negative.  Fact Sheet for Patients:  BloggerCourse.com  Fact Sheet for Healthcare Providers:  SeriousBroker.it  This test is no t yet approved or cleared by  the [place] FDA and  has been authorized for detection and/or diagnosis of SARS-CoV-2 by FDA under an Emergency Use Authorization (EUA). This EUA will remain  in effect (meaning this test can be used) for the duration of the COVID-19 declaration under Section 564(b)(1) of the Act, 21 U.S.C.section 360bbb-3(b)(1), unless the authorization is terminated  or revoked sooner.       Influenza A by PCR NEGATIVE NEGATIVE Final   Influenza B by PCR NEGATIVE NEGATIVE Final    Comment: (NOTE) The Xpert Xpress SARS-CoV-2/FLU/RSV plus assay is intended as an aid in the diagnosis of influenza from Nasopharyngeal swab specimens and should not be used as a sole basis for treatment. Nasal washings and aspirates are unacceptable for Xpert Xpress SARS-CoV-2/FLU/RSV testing.  Fact Sheet for Patients: BloggerCourse.com  Fact Sheet for Healthcare Providers: SeriousBroker.it  This test is not yet approved or cleared by the [place] FDA and has been authorized for detection and/or diagnosis of SARS-CoV-2 by FDA under an Emergency Use Authorization (EUA). This EUA will remain in effect (meaning this test can be used) for the duration of the COVID-19 declaration under Section 564(b)(1) of the Act, 21 U.S.C. section 360bbb-3(b)(1), unless the authorization is terminated or revoked.  Performed at [facility], [address], [place], [place] [address]      Scheduled Meds: .  stroke: mapping our early stages of recovery book   Does not apply Once  . aspirin  325 mg Oral Daily   Or  . aspirin  300 mg Rectal Daily  . atorvastatin  40 mg Oral q1800  . carvedilol  6.25 mg Oral BID WC  . clopidogrel  75 mg Oral Daily  . levothyroxine  50 mcg Oral QAC breakfast  . montelukast  10 mg Oral QHS  . multivitamin with minerals  1 tablet Oral Daily  . warfarin  5 mg Oral Once per day on Tue Fri  . warfarin  7.5 mg Oral Once per day on Sun Mon Wed Thu  Sat  . Warfarin - Physician Dosing Inpatient   Does not apply q1600   Continuous Infusions:  Procedures/Studies: CT ANGIO HEAD W OR WO CONTRAST  Result Date: 03/03/2020 CLINICAL DATA:  Neuro deficit, acute, stroke suspected Stroke/TIA, assess arteries; EXAM: CT ANGIOGRAPHY HEAD AND NECK TECHNIQUE: Multidetector CT imaging of the head and neck was performed using the standard protocol during bolus administration of intravenous contrast. Multiplanar CT image reconstructions and MIPs were obtained to evaluate the vascular anatomy. Carotid stenosis measurements (when applicable) are obtained utilizing NASCET criteria, using the distal internal carotid diameter as the denominator. CONTRAST:  75mL OMNIPAQUE IOHEXOL 350 MG/ML SOLN COMPARISON:  03/03/2020 FINDINGS: CTA NECK FINDINGS Aortic arch: Mild aortic arch atheromatous disease. Aberrant right subclavian artery. Patent great vessel origins. Small proximal left subclavian artery penetrating atherosclerotic ulcer. Right carotid system: Patent CCA. Proximal ICA short-segment dissection versus web. Minimal  bifurcation atheromatous disease without significant narrowing. Left carotid system: Patent CCA. Bifurcation calcified and noncalcified atheromatous plaque. 50% proximal ICA narrowing. Vertebral arteries: Codominant. Mild left V1/V2 segment narrowing. Mild right V1 segment narrowing. Skeleton: No acute or suspicious osseous abnormalities. Multilevel spondylosis and post sternotomy sequela. Other neck: No adenopathy.  No soft tissue mass. Upper chest: Atelectasis.  Right upper lung calcified granuloma. Review of the MIP images confirms the above findings CTA HEAD FINDINGS Anterior circulation: Bilateral carotid siphon atherosclerotic calcifications with mild left cavernous/supraclinoid ICA narrowing. Patent ACAs. Patent MCAs. Posterior circulation: Patent V4 segments and PICA. Patent basilar artery, terminating as the left PCA, also patent. Fetal origin of the  right PCA., patent. Venous sinuses: As permitted by contrast timing, patent. Anatomic variants: Please see above. Review of the MIP images confirms the above findings IMPRESSION: CTA neck: 50% proximal left ICA narrowing. Proximal right ICA short-segment dissection versus web. Mild bilateral V1, left V2 and proximal left subclavian artery narrowing. CTA head: Mild left cavernous/supraclinoid ICA narrowing. No large vessel occlusion or high-grade narrowing. Electronically Signed   By: Stana Buntinghikanele  Emekauwa M.D.   On: 03/03/2020 16:27   CT Head Wo Contrast  Result Date: 03/03/2020 CLINICAL DATA:  Headache, new or worsening. Additional history provided: Patient reports right-sided facial numbness and headache which began this morning. EXAM: CT HEAD WITHOUT CONTRAST TECHNIQUE: Contiguous axial images were obtained from the base of the skull through the vertex without intravenous contrast. COMPARISON:  No pertinent prior exams available for comparison. FINDINGS: Brain: Mild cerebral atrophy. There is no acute intracranial hemorrhage. No demarcated cortical infarct. No extra-axial fluid collection. No evidence of intracranial mass. No midline shift. Vascular: No hyperdense vessel.  Atherosclerotic calcifications. Skull: No calvarial fracture. Nonspecific smooth thinning of the right parietal calvarium. Sinuses/Orbits: Visualized orbits show no acute finding. Trace bilateral ethmoid and maxillary sinus mucosal thickening at the imaged levels. Other: Debris within the right external auditory canal. Trace right mastoid effusion. IMPRESSION: No evidence of acute intracranial abnormality. Mild cerebral atrophy. Trace right mastoid effusion. Electronically Signed   By: Jackey LogeKyle  Golden DO   On: 03/03/2020 10:51   CT ANGIO NECK W OR WO CONTRAST  Result Date: 03/03/2020 CLINICAL DATA:  Neuro deficit, acute, stroke suspected Stroke/TIA, assess arteries; EXAM: CT ANGIOGRAPHY HEAD AND NECK TECHNIQUE: Multidetector CT imaging of  the head and neck was performed using the standard protocol during bolus administration of intravenous contrast. Multiplanar CT image reconstructions and MIPs were obtained to evaluate the vascular anatomy. Carotid stenosis measurements (when applicable) are obtained utilizing NASCET criteria, using the distal internal carotid diameter as the denominator. CONTRAST:  75mL OMNIPAQUE IOHEXOL 350 MG/ML SOLN COMPARISON:  03/03/2020 FINDINGS: CTA NECK FINDINGS Aortic arch: Mild aortic arch atheromatous disease. Aberrant right subclavian artery. Patent great vessel origins. Small proximal left subclavian artery penetrating atherosclerotic ulcer. Right carotid system: Patent CCA. Proximal ICA short-segment dissection versus web. Minimal bifurcation atheromatous disease without significant narrowing. Left carotid system: Patent CCA. Bifurcation calcified and noncalcified atheromatous plaque. 50% proximal ICA narrowing. Vertebral arteries: Codominant. Mild left V1/V2 segment narrowing. Mild right V1 segment narrowing. Skeleton: No acute or suspicious osseous abnormalities. Multilevel spondylosis and post sternotomy sequela. Other neck: No adenopathy.  No soft tissue mass. Upper chest: Atelectasis.  Right upper lung calcified granuloma. Review of the MIP images confirms the above findings CTA HEAD FINDINGS Anterior circulation: Bilateral carotid siphon atherosclerotic calcifications with mild left cavernous/supraclinoid ICA narrowing. Patent ACAs. Patent MCAs. Posterior circulation: Patent V4 segments and PICA. Patent basilar  artery, terminating as the left PCA, also patent. Fetal origin of the right PCA., patent. Venous sinuses: As permitted by contrast timing, patent. Anatomic variants: Please see above. Review of the MIP images confirms the above findings IMPRESSION: CTA neck: 50% proximal left ICA narrowing. Proximal right ICA short-segment dissection versus web. Mild bilateral V1, left V2 and proximal left subclavian  artery narrowing. CTA head: Mild left cavernous/supraclinoid ICA narrowing. No large vessel occlusion or high-grade narrowing. Electronically Signed   By: Stana Bunting M.D.   On: 03/03/2020 16:27   MR ANGIO HEAD WO CONTRAST  Result Date: 03/03/2020 CLINICAL DATA:  Stroke suspected. Additional history provided: Patient reports right arm weakness and right-sided facial numbness, slight headache. EXAM: MRI HEAD WITHOUT CONTRAST MRA HEAD WITHOUT CONTRAST TECHNIQUE: Multiplanar, multiecho pulse sequences of the brain and surrounding structures were obtained without intravenous contrast. Angiographic images of the head were obtained using MRA technique without contrast. COMPARISON:  Noncontrast head CT performed earlier today 03/03/2020. FINDINGS: MRI HEAD FINDINGS Brain: Mild intermittent motion degradation. Mild cerebral atrophy. There are multiple small acute left MCA territory infarcts measuring up to 10 mm, as follows. Acute cortical infarct within the left frontal lobe motor strip (series 5, image 30). Adjacent acute cortical infarct within the high posterior left frontal lobe (series 5, image 31). Acute infarct within the posterior left frontal lobe white matter (series 5, image 27). Acute infarct within the left caudate nucleus (series 5, image 22). Suspected additional tiny acute cortical infarct within the inferolateral left parietal lobe (series 5, image 22). Minimal background multifocal T2/FLAIR hyperintensity within the cerebral white matter is nonspecific, but compatible with chronic small vessel ischemic disease. Subcentimeter chronic infarct within the right cerebellum. No evidence of intracranial mass. No chronic intracranial blood products. No extra-axial fluid collection. No midline shift. Vascular: Reported below. Skull and upper cervical spine: No focal marrow lesion. Incompletely assessed cervical spondylosis. Sinuses/Orbits: Visualized orbits show no acute finding. Trace bilateral  ethmoid sinus mucosal thickening. MRA HEAD FINDINGS The intracranial internal carotid arteries are patent. The M1 middle cerebral arteries are patent. Multiple severe stenoses within both superior and inferior division proximal and mid M2 left MCA branch vessels (for instance as seen on series 1043, image 5 and image 12). Moderate stenosis within a superior division proximal M2 right MCA branch vessel. The anterior cerebral arteries are patent. No intracranial aneurysm is identified. The intracranial vertebral arteries are patent. The basilar artery is patent. The posterior cerebral arteries are patent. The posterior cerebral arteries are patent. Apparent moderate/severe stenosis within the mid to distal P2 segment of the right posterior cerebral artery. IMPRESSION: MRI brain: 1. 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 Notably, one of these infarcts affects the left frontal lobe motor strip. 2. Background mild cerebral atrophy and chronic small vessel ischemic disease. 3. Subcentimeter chronic infarct within the right cerebellar hemisphere. MRA head: 1. No intracranial large vessel occlusion is identified. However, there are multiple severe stenoses within both superior and inferior division proximal and mid M2 left middle cerebral artery branch vessels. 2. Moderate stenosis within a superior division proximal M2 right MCA branch vessel. 3. Apparent moderate/severe stenosis of the P2 right posterior cerebral artery. Electronically Signed   By: Jackey Loge DO   On: 03/03/2020 14:20   MR Brain Wo Contrast (neuro protocol)  Result Date: 03/03/2020 CLINICAL DATA:  Stroke suspected. Additional  history provided: Patient reports right arm weakness and right-sided facial numbness, slight headache. EXAM: MRI HEAD WITHOUT CONTRAST MRA HEAD WITHOUT CONTRAST TECHNIQUE: Multiplanar, multiecho pulse  sequences of the brain and surrounding structures were obtained without intravenous contrast. Angiographic images of the head were obtained using MRA technique without contrast. COMPARISON:  Noncontrast head CT performed earlier today 03/03/2020. FINDINGS: MRI HEAD FINDINGS Brain: Mild intermittent motion degradation. Mild cerebral atrophy. There are multiple small acute left MCA territory infarcts measuring up to 10 mm, as follows. Acute cortical infarct within the left frontal lobe motor strip (series 5, image 30). Adjacent acute cortical infarct within the high posterior left frontal lobe (series 5, image 31). Acute infarct within the posterior left frontal lobe white matter (series 5, image 27). Acute infarct within the left caudate nucleus (series 5, image 22). Suspected additional tiny acute cortical infarct within the inferolateral left parietal lobe (series 5, image 22). Minimal background multifocal T2/FLAIR hyperintensity within the cerebral white matter is nonspecific, but compatible with chronic small vessel ischemic disease. Subcentimeter chronic infarct within the right cerebellum. No evidence of intracranial mass. No chronic intracranial blood products. No extra-axial fluid collection. No midline shift. Vascular: Reported below. Skull and upper cervical spine: No focal marrow lesion. Incompletely assessed cervical spondylosis. Sinuses/Orbits: Visualized orbits show no acute finding. Trace bilateral ethmoid sinus mucosal thickening. MRA HEAD FINDINGS The intracranial internal carotid arteries are patent. The M1 middle cerebral arteries are patent. Multiple severe stenoses within both superior and inferior division proximal and mid M2 left MCA branch vessels (for instance as seen on series 1043, image 5 and image 12). Moderate stenosis within a superior division proximal M2 right MCA branch vessel. The anterior cerebral arteries are patent. No intracranial aneurysm is identified. The intracranial  vertebral arteries are patent. The basilar artery is patent. The posterior cerebral arteries are patent. The posterior cerebral arteries are patent. Apparent moderate/severe stenosis within the mid to distal P2 segment of the right posterior cerebral artery. IMPRESSION: MRI brain: 1. 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 Notably, one of these infarcts affects the left frontal lobe motor strip. 2. Background mild cerebral atrophy and chronic small vessel ischemic disease. 3. Subcentimeter chronic infarct within the right cerebellar hemisphere. MRA head: 1. No intracranial large vessel occlusion is identified. However, there are multiple severe stenoses within both superior and inferior division proximal and mid M2 left middle cerebral artery branch vessels. 2. Moderate stenosis within a superior division proximal M2 right MCA branch vessel. 3. Apparent moderate/severe stenosis of the P2 right posterior cerebral artery. Electronically Signed   By: Jackey Loge DO   On: 03/03/2020 14:20   DG Hand Complete Right  Result Date: 02/28/2020 CLINICAL DATA:  RIGHT thumb deformity this morning, pain RIGHT thumb EXAM: RIGHT HAND - COMPLETE 3+ VIEW COMPARISON:  None FINDINGS: Osseous demineralization. Scattered joint space narrowing throughout RIGHT hand including multiple IP joints, second third and fourth MCP joints, radiocarpal joint, and at radial border of carpus. Advanced degenerative changes at distal radioulnar joint. No acute fracture, dislocation or bone destruction. Slight flexion deformity and minimal subluxation at the first MCP joint, which also demonstrates a mild degree of narrowing. No erosive changes. IMPRESSION: Osseous demineralization with scattered degenerative changes in the RIGHT hand including subluxation and degenerative changes at the first MCP joint. Electronically Signed    By: Angelyn Punt.D.  On: 02/28/2020 14:36    Catarina Hartshorn, DO  [facility]  If 7PM-7AM, please contact night-coverage [email or web address] Password [email or web address] 03/04/2020, 7:19 AM   LOS: 0 days

## 2020-03-04 NOTE — Evaluation (Signed)
Occupational Therapy Evaluation Patient Details Name: Gregory Davila MRN: [id number] DOB: 03-18-1940 Today's Date: 03/04/2020    History of Present Illness Farrell Pantaleo  is a 80 y.o. 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   Clinical Impression   Pt pleasant and agreeable to OT evaluation. Able to complete hand washing standing at sink independently. Able to complete ambulatory transfer to sink and back to chair with independence. UE sensation, coordination, and strength all within functional limits. Pt is not recommended for further OT services.     Follow Up Recommendations  No OT follow up    Equipment Recommendations  None recommended by OT           Precautions / Restrictions Precautions Precautions: None Restrictions Weight Bearing Restrictions: No      Mobility Bed Mobility Overal bed mobility: Independent                  Transfers Overall transfer level: Independent               General transfer comment: Able to complete sit to stand and ambulatory transfer to sink and back to chair with independence.    Balance Overall balance assessment: Independent                   Tandem Stance - Right Leg:  (requires HHA) Tandem Stance - Left Leg:  (requires HHA) Rhomberg - Eyes Opened: 15   High level balance activites: Side stepping;Direction changes;Turns;Sudden stops;Head turns High Level Balance Comments: Pt able to maintain Romberg stance for 15 seconds with mild trunk sway, assumes split stance but unable to assume tandem stance without HHA nor maintain without HHA. Pt completes sidesteps, direction changes, turns, sudden stops, speed  changes and head turns without LOB or unsteadiness noted           ADL either performed or assessed with clinical judgement   ADL Overall ADL's : Independent                                             Vision Baseline Vision/History: Wears glasses                  Pertinent Vitals/Pain Pain Assessment: No/denies pain     Hand Dominance Right   Extremity/Trunk Assessment Upper Extremity Assessment Upper Extremity Assessment: Overall WFL for tasks assessed (Good R UE sensation; 5/5 shoulder flexion ; 4+/5 R grip strength.)   Lower Extremity Assessment Lower Extremity Assessment: Defer to PT evaluation RLE Deficits / Details: AROM WNL, hip, knee and ankle 4+/5, denies numbness/tingling RLE Sensation: WNL RLE Coordination: WNL LLE Deficits / Details: AROM WNL, hip and knee 4/5, ankle 4+/5 denies numbness/tingling LLE Sensation: WNL LLE Coordination: WNL   Cervical / Trunk Assessment Cervical / Trunk Assessment: Normal   Communication Communication Communication: No difficulties   Cognition Arousal/Alertness: Awake/alert Behavior During Therapy: WFL for tasks assessed/performed Overall Cognitive Status: Within Functional Limits for tasks assessed  Home Living Family/patient expects to be discharged to:: Private residence Living Arrangements: Spouse/significant other Available Help at Discharge: Family;Available PRN/intermittently Type of Home: House Home Access: Stairs to enter [employer] of Steps: 1 Entrance Stairs-Rails: None Home Layout: One level     Bathroom Shower/Tub: Producer, television/film/video: Handicapped height Bathroom Accessibility: Yes How Accessible: Accessible via walker Home Equipment: Walker - 4 wheels;Cane - single point;Grab bars - tub/shower;Grab bars - toilet;Shower seat - built in          Prior Functioning/Environment  Level of Independence: Independent        Comments: Pt reports independent with ambulation, ADLs, drives, walks daily for exercise. Pt reports "some" balance decline over the past few years without falls.        OT Problem List:        OT Treatment/Interventions:      OT Goals(Current goals can be found in the care plan section) Acute Rehab OT Goals Patient Stated Goal: Go home.  OT Frequency:      AM-PAC OT "6 Clicks" Daily Activity     Outcome Measure Help from another person eating meals?: None Help from another person taking care of personal grooming?: None Help from another person toileting, which includes using toliet, bedpan, or urinal?: None Help from another person bathing (including washing, rinsing, drying)?: None Help from another person to put on and taking off regular upper body clothing?: None Help from another person to put on and taking off regular lower body clothing?: None 6 Click Score: 24   End of Session    Activity Tolerance: Patient tolerated treatment well Patient left: in chair;with call bell/phone within reach  OT Visit Diagnosis: Muscle weakness (generalized) (M62.81)                Time: 2956-2130 OT Time Calculation (min): 10 min Charges:  OT General Charges $OT Visit: 1 Visit OT Evaluation $OT Eval Low Complexity: 1 Low  Lashya Passe OT, MOT   Danie Chandler 03/04/2020, 10:45 AM

## 2020-03-04 NOTE — Evaluation (Signed)
Speech Language Pathology Evaluation Patient Details Name: Gregory Davila MRN: [id number] DOB: September 23, 1940 Today's Date: 03/04/2020 Time: 7829-5621 SLP Time Calculation (min) (ACUTE ONLY): 24 min  Problem List:  Patient Active Problem List   Diagnosis Date Noted  . Acute ischemic stroke (HCC) 03/04/2020  . CVA (cerebral vascular accident) (HCC) 03/03/2020  . Hypothyroid 03/03/2020  . Subtherapeutic international normalized ratio (INR) 03/03/2020  . Pseudoaneurysm following procedure (HCC) 09/21/2018  . CKD (chronic kidney disease) stage 3, GFR 30-59 ml/min (HCC) 07/21/2018  . Neck pain 07/21/2018  . Atherosclerotic heart disease of native coronary artery without angina pectoris 07/21/2018  . Gout 02/16/2017  . History of pulmonary embolism 02/16/2017  . Essential hypertension 02/16/2017  . Chronic combined systolic and diastolic CHF (congestive heart failure) (HCC) 01/27/2016  . Pure hypercholesterolemia 01/27/2016  . 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 History of non-ST elevation myocardial infarction (NSTEMI) 07/13/2011  . Atrial fibrillation with RVR (HCC) 07/12/2011  . Persistent atrial fibrillation (HCC)   . Coronary artery disease involving native coronary artery of native heart without angina pectoris   . Hypertension   . Renal insufficiency   . Pulmonary embolism (HCC)   . History of amiodarone therapy 07/27/2010  . Long term (current) use of anticoagulants 04/13/2010  . EDEMA 11/20/2009  . Chronic rhinitis 02/04/2009  . GERD (gastroesophageal reflux disease) 11/05/2006   Past Medical History:  Past Medical History:  Diagnosis Date  . Anginal pain (HCC)   . CAD (coronary artery disease)    a, 2011: occluded LAD with left to right collaterals and moderate disease in the right coronary artery and circumflex, EF 50%. b. NSTEMI 07/2011 in setting of AF-RVR, MV abnl, cath w/ LAD 100%, RCA mod-severe dz, small vessel, med rx  . Chronic renal insufficiency    followed by primary care  physician  . Gout   . Hypertension    long-standing  . Myocardial infarction (HCC)   . Paroxysmal atrial fibrillation (HCC)    a. On Coumadin therapy/amidarone. b. 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  . Pulmonary embolism [facility])    Past Surgical History:  Past Surgical History:  Procedure Laterality Date  . APPENDECTOMY    . ATRIAL FIBRILLATION ABLATION N/A 09/20/2018   Procedure: ATRIAL FIBRILLATION ABLATION;  Surgeon: Regan Lemming, MD;  Location: [facility] INVASIVE CV LAB;  Service: Cardiovascular;  Laterality: N/A;  . CARDIAC CATHETERIZATION  2011 & 2013   2013 [place], LAD 100%, CFX 50%, RCA 80%, small vessel, med rx  . CORONARY ARTERY BYPASS GRAFT N/A 02/14/2014   Procedure: CORONARY ARTERY BYPASS GRAFTING (CABG)X4 LIMA-LAD; SVG-RCA(ENDARDERECTOMY); SVG-OM; SVG-DIAG;  Surgeon: Kerin Perna, MD;  Location: [facility] OR;  Service: Open Heart Surgery;  Laterality: N/A;  . FALSE ANEURYSM REPAIR Right 09/22/2018   Procedure: REPAIR FEMORAL PSEUDO ANEURYSM AND ARTERIOVENOUS FISTULA;  Surgeon: Sherren Kerns, MD;  Location: [facility] OR;  Service: Vascular;  Laterality: Right;  . LEFT HEART CATHETERIZATION WITH CORONARY ANGIOGRAM N/A 02/08/2014   Procedure: LEFT HEART CATHETERIZATION WITH CORONARY ANGIOGRAM;  Surgeon: Micheline Chapman, MD;  Location: [facility] CATH LAB;  Service: Cardiovascular;  Laterality: N/A;  . NM MYOVIEW LTD  2013   Scar in the anteroseptal/periapical distribution with moderate peri-infarct ischemia, EF 43%  . STERNAL INCISION RECLOSURE N/A 02/28/2014   Procedure: Irrigation and Debridement of Sternum with Sternal Rewiring;  Surgeon: Kerin Perna, MD;  Location: [facility] OR;  Service: Thoracic;  Laterality: N/A;  . TEE  WITHOUT CARDIOVERSION N/A 02/14/2014   Procedure: TRANSESOPHAGEAL ECHOCARDIOGRAM (TEE);  Surgeon: Kerin Perna, MD;  Location: [facility] OR;  Service: Open Heart Surgery;  Laterality: N/A;  . TOTAL KNEE ARTHROPLASTY Bilateral    HPI:  Gregory Davila  is a 80 y.o. 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   Assessment / Plan / Recommendation Clinical Impression  Pt was seen in his room for a cognitive linguistic evaluation with his wife present. Pt without baseline cognitive linguistic deficits per Pt/spouse and no change noted with recent strokes. Pt without aphasia, dysarthria, apraxia, or cognitive deficits during the evaluation. He appreciates changes in vision over the past few years and plans to make an appointment with his optometrist. 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 Pt was able to express complex thoughts, follow multistep directions, and provide adequate summary of today's events. No further SLP needs identified as pt appears to be independent with cognitive linguistic skills and is at baseline. Pt and spouse are in agreement. SLP will sign off.    SLP Assessment  SLP Recommendation/Assessment: Patient does not need any further Speech Lanaguage Pathology Services SLP Visit Diagnosis: Cognitive communication deficit (R41.841)    Follow Up Recommendations  None    Frequency and Duration           SLP Evaluation Cognition  Overall Cognitive Status: Within Functional Limits for tasks assessed Arousal/Alertness: Awake/alert Orientation Level: Oriented X4 Memory: Appears intact Awareness: Appears intact Problem Solving: Appears intact Safety/Judgment: Appears intact       Comprehension  Auditory Comprehension Overall Auditory Comprehension: Appears within functional limits for tasks  assessed Yes/No Questions: Within Functional Limits Commands: Within Functional Limits Conversation: Complex Visual Recognition/Discrimination Discrimination: Within Function Limits Reading Comprehension Reading Status: Not tested (Pt reported that he worked on the crossword puzzle today)    Expression Expression Primary Mode of Expression: Verbal Verbal Expression Overall Verbal Expression: Appears within functional limits for tasks assessed Initiation: No impairment Automatic Speech: Name;Social Response Level of Generative/Spontaneous Verbalization: Conversation Repetition: No impairment Naming: No impairment Pragmatics: No impairment Non-Verbal Means of Communication: Not applicable Written Expression Dominant Hand: Right Written Expression: Not tested   Oral / Motor  Oral Motor/Sensory Function Overall Oral Motor/Sensory Function: Within functional limits Motor Speech Overall Motor Speech: Appears within functional limits for tasks assessed Respiration: Within functional limits Phonation: Normal Resonance: Within functional limits Articulation: Within functional limitis Intelligibility: Intelligible Motor Planning: Witnin functional limits Motor Speech Errors: Not applicable   Thank you,  Havery Moros, CCC-SLP [phone]                     Kailiana Granquist 03/04/2020, 1:46 PM

## 2020-03-04 NOTE — Care Management Obs Status (Signed)
MEDICARE OBSERVATION STATUS NOTIFICATION   Patient Details  Name: Gregory Davila MRN: [id number] Date of Birth: [birth year]   Medicare Observation Status Notification Given:  Yes (mailed to address on file due to precautions -[address], [place], [place] [address])    Corey Harold 03/04/2020, 4:14 PM

## 2020-03-04 NOTE — Progress Notes (Addendum)
ANTICOAGULATION CONSULT NOTE - Initial Consult  Pharmacy Consult for warfarin Indication: atrial fibrillation/hx of PE  Allergies  Allergen Reactions  . Azithromycin Nausea And Vomiting    "throws for a loop"  . Oxycodone Other (See Comments)    nightmares    Patient Measurements: Height: 5\' 10"  (177.8 cm) Weight: 95.3 kg (210 lb 1.6 oz) IBW/kg (Calculated) : 73 Heparin Dosing Weight:   Vital Signs: Temp: 97.8 F (36.6 C) (03/01 0630) Temp Source: Oral (03/01 0630) BP: 113/80 (03/01 0630) Pulse Rate: 85 (03/01 0630)  Labs: Recent Labs    03/03/20 1033 03/04/20 0435  HGB 16.6 16.1  HCT 50.0 48.9  PLT 212 212  LABPROT 19.6* 19.6*  INR 1.7* 1.7*  CREATININE 1.41* 1.40*    Estimated Creatinine Clearance: 49.6 mL/min (A) (by C-G formula based on SCr of 1.4 mg/dL (H)).   Medical History: Past Medical History:  Diagnosis Date  . Anginal pain (HCC)   . CAD (coronary artery disease)    a, 2011: occluded LAD with left to right collaterals and moderate disease in the right coronary artery and circumflex, EF 50%. b. NSTEMI 07/2011 in setting of AF-RVR, MV abnl, cath w/ LAD 100%, RCA mod-severe dz, small vessel, med rx  . Chronic renal insufficiency    followed by primary care physician  . Gout   . Hypertension    long-standing  . Myocardial infarction (HCC)   . Paroxysmal atrial fibrillation (HCC)    a. On Coumadin therapy/amidarone. b. 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  . Pulmonary embolism (HCC)     Medications:  Medications Prior to Admission  Medication Sig Dispense Refill Last Dose  . allopurinol (ZYLOPRIM) 100 MG tablet Take 100 mg by mouth 2 (two) times daily.   03/02/2020 at 0800  . aspirin EC 81 MG tablet Take 81 mg by mouth daily.   03/02/2020 at Unknown time  . atorvastatin (LIPITOR) 40 MG tablet Take 1 tablet (40 mg total) by mouth daily at 6 PM. 30 tablet 1 03/02/2020 at Unknown time  . calcium carbonate (TUMS - DOSED IN  MG ELEMENTAL CALCIUM) 500 MG chewable tablet Chew 2 tablets by mouth as needed for indigestion or heartburn.      . carvedilol (COREG) 12.5 MG tablet TAKE ONE TABLET BY MOUTH TWICE DAILY. (Patient taking differently: Take 12.5 mg by mouth in the morning and at bedtime.) 180 tablet 3 03/03/2020 at 0830  . chlorthalidone (HYGROTON) 25 MG tablet TAKE ONE TABLET BY MOUTH DAILY. (Patient taking differently: Take 25 mg by mouth daily.) 90 tablet 3 03/03/2020 at Unknown time  . Cholecalciferol (VITAMIN D3) 50 MCG (2000 UT) TABS Take 1 tablet by mouth every other day.   03/02/2020 at Unknown time  . COLCRYS 0.6 MG tablet Take 0.6 mg by mouth as needed (gout).    Past Month at Unknown time  . levothyroxine (SYNTHROID) 50 MCG tablet Take 50 mcg by mouth daily before breakfast.    03/02/2020 at Unknown time  . montelukast (SINGULAIR) 10 MG tablet Take 10 mg by mouth at bedtime.   03/02/2020 at Unknown time  . multivitamin (THERAGRAN) per tablet Take 1 tablet by mouth daily.   03/02/2020 at Unknown time  . Naphazoline HCl (CLEAR EYES OP) Place 1 drop into both eyes daily as needed (irritation).     . nitroGLYCERIN (NITROSTAT) 0.4 MG SL tablet Place 1 tablet (0.4 mg total) under the tongue every 5 (five) minutes x 3 doses as  needed for chest pain. 25 tablet 2   . potassium citrate (UROCIT-K) 10 MEQ (1080 MG) SR tablet Take 10 mEq by mouth in the morning and at bedtime.   03/02/2020 at Unknown time  . warfarin (COUMADIN) 5 MG tablet Take 1 - 2 tablets daily or as directed (Patient taking differently: Take 5 mg by mouth See admin instructions. Take 1 and 1/2 tablet Mon, Wed, Thurs, Sat and Sunday and then take 1 tablet on Tuesday and Friday.) 60 tablet 4 03/02/2020 at Unknown time  . diltiazem (CARDIZEM) 30 MG tablet Take 15-30 mg by mouth 2 (two) times daily as needed (increased HR).  (Patient not taking: Reported on 03/03/2020)   Not Taking at Unknown time  . fluticasone (FLONASE) 50 MCG/ACT nasal spray Place 1 spray into  both nostrils daily as needed for allergies or rhinitis.     [name] loratadine (CLARITIN) 10 MG tablet Take 10 mg by mouth daily. (Patient not taking: Reported on 03/03/2020)   Not Taking at Unknown time    Assessment: Pharmacy consulted to dose warfarin in patient with atrial fibrillation.  Patient's home dose listed as 5 mg Tue + Fri and 7.5 mg ROW.  INR 1.7- subtherapeutic   Goal of Therapy:  INR 2-3 Monitor platelets by anticoagulation protocol: Yes   Plan:  Warfarin 7.5 mg x 1 dose. Monitor daily INR and s/s of bleeding.  Tad Moore 03/04/2020,8:42 AM

## 2020-03-10 ENCOUNTER — Other Ambulatory Visit: Payer: Self-pay

## 2020-03-10 ENCOUNTER — Ambulatory Visit (INDEPENDENT_AMBULATORY_CARE_PROVIDER_SITE_OTHER): Payer: Medicare Other | Admitting: *Deleted

## 2020-03-10 DIAGNOSIS — Z5181 Encounter for therapeutic drug level monitoring: Secondary | ICD-10-CM | POA: Diagnosis not present

## 2020-03-10 DIAGNOSIS — I639 Cerebral infarction, unspecified: Secondary | ICD-10-CM | POA: Diagnosis not present

## 2020-03-10 DIAGNOSIS — I4819 Other persistent atrial fibrillation: Secondary | ICD-10-CM | POA: Diagnosis not present

## 2020-03-10 LAB — POCT INR: INR: 2.4 (ref 2.0–3.0)

## 2020-03-10 NOTE — Patient Instructions (Signed)
Continue warfarin 1 1/2 tablets daily except 1 tablet on Tuesdays and Fridays Recheck in 3 weeks 

## 2020-03-25 ENCOUNTER — Other Ambulatory Visit: Payer: Self-pay | Admitting: Cardiovascular Disease

## 2020-03-26 ENCOUNTER — Telehealth: Payer: Self-pay | Admitting: Cardiology

## 2020-03-26 NOTE — Telephone Encounter (Signed)
Please excuse the typo.  The patient received a text message stating that he needs to review his prescriptions.

## 2020-03-26 NOTE — Telephone Encounter (Signed)
Patient states he received a test stating that he needs to review his prescriptions from Dr. Elberta Fortis. He would like to know what this means.

## 2020-03-26 NOTE — Telephone Encounter (Signed)
Pt aware our office does not sent texts to patients, that we communicate via Mychart which he is not apart of. Advised to follow up with PCP or pharmacy about the matter. Patient verbalized understanding and agreeable to plan.

## 2020-03-31 ENCOUNTER — Ambulatory Visit (INDEPENDENT_AMBULATORY_CARE_PROVIDER_SITE_OTHER): Payer: Medicare Other | Admitting: *Deleted

## 2020-03-31 ENCOUNTER — Other Ambulatory Visit: Payer: Self-pay

## 2020-03-31 DIAGNOSIS — I4819 Other persistent atrial fibrillation: Secondary | ICD-10-CM | POA: Diagnosis not present

## 2020-03-31 DIAGNOSIS — Z5181 Encounter for therapeutic drug level monitoring: Secondary | ICD-10-CM

## 2020-03-31 DIAGNOSIS — I639 Cerebral infarction, unspecified: Secondary | ICD-10-CM

## 2020-03-31 LAB — POCT INR: INR: 2.9 (ref 2.0–3.0)

## 2020-03-31 NOTE — Patient Instructions (Signed)
Continue warfarin 1 1/2 tablets daily except 1 tablet on Tuesdays and Fridays Recheck in 4 weeks 

## 2020-04-21 ENCOUNTER — Other Ambulatory Visit: Payer: Self-pay

## 2020-04-21 MED ORDER — CHLORTHALIDONE 25 MG PO TABS: 25.0000 mg | ORAL_TABLET | Freq: Every day | ORAL | 0 refills | Status: DC

## 2020-04-28 ENCOUNTER — Ambulatory Visit (INDEPENDENT_AMBULATORY_CARE_PROVIDER_SITE_OTHER): Payer: Medicare Other | Admitting: *Deleted

## 2020-04-28 DIAGNOSIS — I4819 Other persistent atrial fibrillation: Secondary | ICD-10-CM

## 2020-04-28 DIAGNOSIS — Z5181 Encounter for therapeutic drug level monitoring: Secondary | ICD-10-CM | POA: Diagnosis not present

## 2020-04-28 LAB — POCT INR: INR: 3.2 — AB (ref 2.0–3.0)

## 2020-04-28 NOTE — Patient Instructions (Signed)
Hold warfarin tonight then resume 1 1/2 tablets daily except 1 tablet on Tuesdays and Fridays Recheck in 4 weeks

## 2020-04-29 ENCOUNTER — Other Ambulatory Visit: Payer: Self-pay | Admitting: Cardiology

## 2020-05-07 ENCOUNTER — Ambulatory Visit: Payer: Medicare Other | Admitting: Neurology

## 2020-05-26 ENCOUNTER — Ambulatory Visit (INDEPENDENT_AMBULATORY_CARE_PROVIDER_SITE_OTHER): Payer: Medicare Other | Admitting: *Deleted

## 2020-05-26 DIAGNOSIS — I4819 Other persistent atrial fibrillation: Secondary | ICD-10-CM | POA: Diagnosis not present

## 2020-05-26 DIAGNOSIS — Z5181 Encounter for therapeutic drug level monitoring: Secondary | ICD-10-CM

## 2020-05-26 LAB — POCT INR: INR: 2.8 (ref 2.0–3.0)

## 2020-05-26 NOTE — Patient Instructions (Signed)
Continue warfarin 1 1/2 tablets daily except 1 tablet on Tuesdays and Fridays Recheck in 4 weeks 

## 2020-05-30 ENCOUNTER — Other Ambulatory Visit: Payer: Self-pay | Admitting: *Deleted

## 2020-05-30 MED ORDER — CARVEDILOL 12.5 MG PO TABS: 12.5000 mg | ORAL_TABLET | Freq: Two times a day (BID) | ORAL | 1 refills | Status: DC

## 2020-06-23 NOTE — Progress Notes (Signed)
PCP:  Suzan Slick, MD Primary Cardiologist: Tonny Bollman, MD Electrophysiologist: Regan Lemming, MD   Gregory Davila is a 80 y.o. male seen today for Will Jorja Loa, MD for routine electrophysiology followup.  Since last being seen in our clinic the patient reports doing very well from a cardiac perspective.  He had a "light stroke" earlier this year, without any lasting deficits. Had bleeding on plavix and is now on coumadin alone.    Feels like he had a brief period of AF about 3-4 months ago when he got up to urinate. Said HR got up to 181. Didn't feel bad. He took a diltiazem and laid back down and HR was back down within an hour.   He denies chest pain, dyspnea, PND, orthopnea, nausea, vomiting, dizziness, syncope, edema, weight gain, or early satiety.  Past Medical History:  Diagnosis Date   Anginal pain (HCC)    CAD (coronary artery disease)    a, 2011: occluded LAD with left to right collaterals and moderate disease in the right coronary artery and circumflex, EF 50%. b. NSTEMI 07/2011 in setting of AF-RVR, MV abnl, cath w/ LAD 100%, RCA mod-severe dz, small vessel, med rx   Chronic renal insufficiency    followed by primary care physician   Gout    Hypertension    long-standing   Myocardial infarction [facility])    Paroxysmal atrial fibrillation (HCC)    a. On Coumadin therapy/amidarone. b. 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   Pulmonary embolism (HCC)    Past Surgical History:  Procedure Laterality Date   APPENDECTOMY     ATRIAL FIBRILLATION ABLATION N/A 09/20/2018   Procedure: ATRIAL FIBRILLATION ABLATION;  Surgeon: Regan Lemming, MD;  [facility]: [facility] INV[facility]IVE CV LAB;  Service: Cardiovascular;  Laterality: N/A;   CARDIAC CATHETERIZATION  2011 & 2013   2013 L main [place], LAD 100%, CFX 50%, RCA 80%, small vessel, med rx   CORONARY ARTERY BYPASS GRAFT N/A 02/14/2014   Procedure: CORONARY ARTERY BYPASS GRAFTING (CABG)X4  LIMA-LAD; SVG-RCA(ENDARDERECTOMY); SVG-OM; SVG-DIAG;  Surgeon: Kerin Perna, MD;  Location: [facility] OR;  Service: Open Heart Surgery;  Laterality: N/A;   FALSE ANEURYSM REPAIR Right 09/22/2018   Procedure: REPAIR FEMORAL PSEUDO ANEURYSM AND ARTERIOVENOUS FISTULA;  Surgeon: Sherren Kerns, MD;  Location: [facility] OR;  Service: Vascular;  Laterality: Right;   LEFT HEART CATHETERIZATION WITH CORONARY ANGIOGRAM N/A 02/08/2014   Procedure: LEFT HEART CATHETERIZATION WITH CORONARY ANGIOGRAM;  Surgeon: Micheline Chapman, MD;  Location: [facility] CATH LAB;  Service: Cardiovascular;  Laterality: N/A;   NM MYOVIEW LTD  2013   Scar in the anteroseptal/periapical distribution with moderate peri-infarct ischemia, EF 43%   STERNAL INCISION RECLOSURE N/A 02/28/2014   Procedure: Irrigation and Debridement of Sternum with Sternal Rewiring;  Surgeon: Kerin Perna, MD;  Location: [facility] OR;  Service: Thoracic;  Laterality: N/A;   TEE WITHOUT CARDIOVERSION N/A 02/14/2014   Procedure: TRANSESOPHAGEAL ECHOCARDIOGRAM (TEE);  Surgeon: Kerin Perna, MD;  Location: [facility] OR;  Service: Open Heart Surgery;  Laterality: N/A;   TOTAL KNEE ARTHROPLASTY Bilateral     Current Outpatient Medications  Medication Sig Dispense Refill   allopurinol (ZYLOPRIM) 100 MG tablet Take 100 mg by mouth 2 (two) times daily.     atorvastatin (LIPITOR) 40 MG tablet Take 1 tablet (40 mg total) by mouth daily at 6 PM. 30 tablet 1   calcium carbonate (TUMS - DOSED IN MG ELEMENTAL CALCIUM) 500  MG chewable tablet Chew 2 tablets by mouth as needed for indigestion or heartburn.      carvedilol (COREG) 12.5 MG tablet Take 1 tablet (12.5 mg total) by mouth 2 (two) times daily. Please keep 07/21/2020 appointment for further refills 60 tablet 1   chlorthalidone (HYGROTON) 25 MG tablet Take 1 tablet (25 mg total) by mouth daily. Please make yearly appt with Dr. Elberta Fortis for June 2022 for future refills. Thank you 1st attempt 90 tablet 0   Cholecalciferol (VITAMIN D3) 50 MCG  (2000 UT) TABS Take 1 tablet by mouth every other day.     COLCRYS 0.6 MG tablet Take 0.6 mg by mouth as needed (gout).      fluticasone (FLONASE) 50 MCG/ACT nasal spray Place 1 spray into both nostrils daily as needed for allergies or rhinitis.     levothyroxine (SYNTHROID) 50 MCG tablet Take 50 mcg by mouth daily before breakfast.      montelukast (SINGULAIR) 10 MG tablet Take 10 mg by mouth at bedtime.     multivitamin (THERAGRAN) per tablet Take 1 tablet by mouth daily.     Naphazoline HCl (CLEAR EYES OP) Place 1 drop into both eyes daily as needed (irritation).     potassium citrate (UROCIT-K) 10 MEQ (1080 MG) SR tablet Take 10 mEq by mouth in the morning and at bedtime.     warfarin (COUMADIN) 5 MG tablet TAKE 1 OR 2 TABLETS BY MOUTH ONCE DAILY OR AS DIRECTED 60 tablet 4   No current facility-administered medications for this visit.    Allergies  Allergen Reactions   Azithromycin Nausea And Vomiting    "throws for a loop"   Oxycodone Other (See Comments)    nightmares    Social History   Socioeconomic History   Marital status: Married    Spouse name: Not on file   Number of children: Not on file   Years of education: Not on file   Highest education level: Not on file  Occupational History   Occupation: Retired  Tobacco Use   Smoking status: Former    Pack years: 0.00    Types: Cigarettes    Quit date: 01/04/1966    Years since quitting: 54.5   Smokeless tobacco: Former    Types: Chew    Quit date: 01/05/2007   Tobacco comments:    quit chewing tobacco about 4 yrs ago  Vaping Use   Vaping Use: Never used  Substance and Sexual Activity   Alcohol use: Yes    Comment: "seldomly drink beer" 1 every 2-3 weeks   Drug use: No   Sexual activity: Yes  Other Topics Concern   Not on file  Social History Narrative   Not on file   Social Determinants of Health   Financial Resource Strain: Not on file  Food Insecurity: Not on file  Transportation Needs: Not on file   Physical Activity: Not on file  Stress: Not on file  Social Connections: Not on file  Intimate Partner Violence: Not on file     Review of Systems: All other systems reviewed and are otherwise negative except as noted above.  Physical Exam: Vitals:   06/24/20 0806  BP: 112/68  Pulse: 70  SpO2: 95%  Weight: 200 lb 12.8 oz (91.1 kg)  Height: 5\' 10"  (1.778 m)    GEN- The patient is well appearing, alert and oriented x 3 today.   HEENT: normocephalic, atraumatic; sclera clear, conjunctiva pink; hearing intact; oropharynx clear; neck supple, no  JVP Lymph- no cervical lymphadenopathy Lungs- Clear to ausculation bilaterally, normal work of breathing.  No wheezes, rales, rhonchi Heart- Regular rate and rhythm, no murmurs, rubs or gallops, PMI not laterally displaced GI- soft, non-tender, non-distended, bowel sounds present, no hepatosplenomegaly Extremities- no clubbing, cyanosis, or edema; DP/PT/radial pulses 2+ bilaterally MS- no significant deformity or atrophy Skin- warm and dry, no rash or lesion Psych- euthymic mood, full affect Neuro- strength and sensation are intact  EKG is not ordered. Personal review of EKG from ED on  03/03/20  shows NSR at 79 bpm with a PAC  Additional studies reviewed include: Previous EP office notes. Echo 03/04/20 LVEF 55-60% Previous admission notes  Assessment and Plan:  1. Persistent Atrial fibrillation s/p ablation 09/2018 On coreg and coumadin for CHA2DS2-VASc of at least 4. Only 1 breakthrough episode that he can think of since his surgery.  OK to continue to use diltiazem prn. We will refill.   2. CAD s/p CABG Denies s/s ischemia  3. HTN Continue current medications  4. H/o CVA No lasting deficit, following with PCP. Does not appear to have followed up with neurology.   Gregory Freer, PA-C  06/24/20 8:09 AM

## 2020-06-24 ENCOUNTER — Encounter: Payer: Self-pay | Admitting: Student

## 2020-06-24 ENCOUNTER — Other Ambulatory Visit: Payer: Self-pay

## 2020-06-24 ENCOUNTER — Ambulatory Visit: Payer: Medicare Other | Admitting: Student

## 2020-06-24 VITALS — BP 112/68 | HR 70 | Ht 70.0 in | Wt 200.8 lb

## 2020-06-24 DIAGNOSIS — I2510 Atherosclerotic heart disease of native coronary artery without angina pectoris: Secondary | ICD-10-CM | POA: Diagnosis not present

## 2020-06-24 DIAGNOSIS — I10 Essential (primary) hypertension: Secondary | ICD-10-CM

## 2020-06-24 DIAGNOSIS — I4819 Other persistent atrial fibrillation: Secondary | ICD-10-CM

## 2020-06-24 MED ORDER — DILTIAZEM HCL 30 MG PO TABS: 30.0000 mg | ORAL_TABLET | Freq: Every day | ORAL | 3 refills | Status: DC | PRN

## 2020-06-24 NOTE — Patient Instructions (Signed)
Medication Instructions:  Your physician has recommended you make the following change in your medication:  START: Diltiazem 30mg  as needed for Tachycardia (fast heart rate)   *If you need a refill on your cardiac medications before your next appointment, please call your pharmacy*   Lab Work: None If you have labs (blood work) drawn today and your tests are completely normal, you will receive your results only by: MyChart Message (if you have MyChart) OR A paper copy in the mail If you have any lab test that is abnormal or we need to change your treatment, we will call you to review the results.   Follow-Up: At [facility], you and your health needs are our priority.  As part of our continuing mission to provide you with exceptional heart care, we have created designated Provider Care Teams.  These Care Teams include your primary Cardiologist (physician) and Advanced Practice Providers (APPs -  Physician Assistants and Nurse Practitioners) who all work together to provide you with the care you need, when you need it.  We recommend signing up for the patient portal called "MyChart".  Sign up information is provided on this After Visit Summary.  MyChart is used to connect with patients for Virtual Visits (Telemedicine).  Patients are able to view lab/test results, encounter notes, upcoming appointments, etc.  Non-urgent messages can be sent to your provider as well.   To learn more about what you can do with MyChart, go to [facility].    Your next appointment:   1 year(s)  The format for your next appointment:   In Person  Provider:   You may see Will [email or web address], MD or one of the following Advanced Practice Providers on your designated Care Team:   Jorja Loa, Francis Dowse [place] "[facility]" [place], [place]

## 2020-06-26 ENCOUNTER — Ambulatory Visit (INDEPENDENT_AMBULATORY_CARE_PROVIDER_SITE_OTHER): Payer: Medicare Other | Admitting: *Deleted

## 2020-06-26 ENCOUNTER — Other Ambulatory Visit: Payer: Self-pay

## 2020-06-26 DIAGNOSIS — Z5181 Encounter for therapeutic drug level monitoring: Secondary | ICD-10-CM

## 2020-06-26 DIAGNOSIS — I4819 Other persistent atrial fibrillation: Secondary | ICD-10-CM

## 2020-06-26 LAB — POCT INR: INR: 2.5 (ref 2.0–3.0)

## 2020-06-26 NOTE — Patient Instructions (Signed)
Continue warfarin 1 1/2 tablets daily except 1 tablet on Tuesdays and Fridays Recheck in 5 weeks

## 2020-07-01 ENCOUNTER — Other Ambulatory Visit: Payer: Self-pay | Admitting: Cardiovascular Disease

## 2020-07-17 ENCOUNTER — Other Ambulatory Visit: Payer: Self-pay

## 2020-07-17 ENCOUNTER — Emergency Department (HOSPITAL_COMMUNITY): Payer: Medicare Other

## 2020-07-17 ENCOUNTER — Encounter (HOSPITAL_COMMUNITY): Payer: Self-pay

## 2020-07-17 ENCOUNTER — Emergency Department (HOSPITAL_COMMUNITY)
Admission: EM | Admit: 2020-07-17 | Discharge: 2020-07-17 | Disposition: A | Discharge status: Home / Self Care | Payer: Medicare Other | Attending: Emergency Medicine | Admitting: Emergency Medicine

## 2020-07-17 ENCOUNTER — Ambulatory Visit: Payer: Medicare Other | Admitting: Neurology

## 2020-07-17 DIAGNOSIS — I4891 Unspecified atrial fibrillation: Secondary | ICD-10-CM | POA: Insufficient documentation

## 2020-07-17 DIAGNOSIS — Z951 Presence of aortocoronary bypass graft: Secondary | ICD-10-CM | POA: Insufficient documentation

## 2020-07-17 DIAGNOSIS — I639 Cerebral infarction, unspecified: Secondary | ICD-10-CM | POA: Diagnosis not present

## 2020-07-17 DIAGNOSIS — Z7901 Long term (current) use of anticoagulants: Secondary | ICD-10-CM | POA: Insufficient documentation

## 2020-07-17 DIAGNOSIS — I130 Hypertensive heart and chronic kidney disease with heart failure and stage 1 through stage 4 chronic kidney disease, or unspecified chronic kidney disease: Secondary | ICD-10-CM | POA: Insufficient documentation

## 2020-07-17 DIAGNOSIS — R519 Headache, unspecified: Secondary | ICD-10-CM | POA: Diagnosis present

## 2020-07-17 DIAGNOSIS — G459 Transient cerebral ischemic attack, unspecified: Secondary | ICD-10-CM

## 2020-07-17 DIAGNOSIS — N183 Chronic kidney disease, stage 3 unspecified: Secondary | ICD-10-CM | POA: Diagnosis not present

## 2020-07-17 DIAGNOSIS — Z20822 Contact with and (suspected) exposure to covid-19: Secondary | ICD-10-CM | POA: Diagnosis not present

## 2020-07-17 DIAGNOSIS — Z79899 Other long term (current) drug therapy: Secondary | ICD-10-CM | POA: Insufficient documentation

## 2020-07-17 DIAGNOSIS — Z96653 Presence of artificial knee joint, bilateral: Secondary | ICD-10-CM | POA: Diagnosis not present

## 2020-07-17 DIAGNOSIS — E039 Hypothyroidism, unspecified: Secondary | ICD-10-CM | POA: Insufficient documentation

## 2020-07-17 DIAGNOSIS — I5042 Chronic combined systolic (congestive) and diastolic (congestive) heart failure: Secondary | ICD-10-CM | POA: Diagnosis not present

## 2020-07-17 DIAGNOSIS — I2510 Atherosclerotic heart disease of native coronary artery without angina pectoris: Secondary | ICD-10-CM | POA: Diagnosis not present

## 2020-07-17 DIAGNOSIS — Z87891 Personal history of nicotine dependence: Secondary | ICD-10-CM | POA: Insufficient documentation

## 2020-07-17 HISTORY — DX: Cerebral infarction, unspecified: I63.9

## 2020-07-17 LAB — I-STAT CHEM 8, ED
BUN: 27 mg/dL — ABNORMAL HIGH (ref 8–23)
Calcium, Ion: 1.24 mmol/L (ref 1.15–1.40)
Chloride: 102 mmol/L (ref 98–111)
Creatinine, Ser: 1.7 mg/dL — ABNORMAL HIGH (ref 0.61–1.24)
Glucose, Bld: 101 mg/dL — ABNORMAL HIGH (ref 70–99)
HCT: 50 % (ref 39.0–52.0)
Hemoglobin: 17 g/dL (ref 13.0–17.0)
Potassium: 4.8 mmol/L (ref 3.5–5.1)
Sodium: 139 mmol/L (ref 135–145)
TCO2: 26 mmol/L (ref 22–32)

## 2020-07-17 LAB — DIFFERENTIAL
Abs Immature Granulocytes: 0.03 10*3/uL (ref 0.00–0.07)
Basophils Absolute: 0.1 10*3/uL (ref 0.0–0.1)
Basophils Relative: 1 %
Eosinophils Absolute: 0.3 10*3/uL (ref 0.0–0.5)
Eosinophils Relative: 3 %
Immature Granulocytes: 0 %
Lymphocytes Relative: 20 %
Lymphs Abs: 1.5 10*3/uL (ref 0.7–4.0)
Monocytes Absolute: 0.7 10*3/uL (ref 0.1–1.0)
Monocytes Relative: 9 %
Neutro Abs: 4.9 10*3/uL (ref 1.7–7.7)
Neutrophils Relative %: 67 %

## 2020-07-17 LAB — URINALYSIS, ROUTINE W REFLEX MICROSCOPIC
Bilirubin Urine: NEGATIVE
Glucose, UA: NEGATIVE mg/dL
Hgb urine dipstick: NEGATIVE
Ketones, ur: NEGATIVE mg/dL
Leukocytes,Ua: NEGATIVE
Nitrite: NEGATIVE
Protein, ur: NEGATIVE mg/dL
Specific Gravity, Urine: 1.012 (ref 1.005–1.030)
pH: 7 (ref 5.0–8.0)

## 2020-07-17 LAB — CBC
HCT: 48.4 % (ref 39.0–52.0)
Hemoglobin: 15.9 g/dL (ref 13.0–17.0)
MCH: 31.2 pg (ref 26.0–34.0)
MCHC: 32.9 g/dL (ref 30.0–36.0)
MCV: 94.9 fL (ref 80.0–100.0)
Platelets: 209 10*3/uL (ref 150–400)
RBC: 5.1 MIL/uL (ref 4.22–5.81)
RDW: 14.6 % (ref 11.5–15.5)
WBC: 7.4 10*3/uL (ref 4.0–10.5)
nRBC: 0 % (ref 0.0–0.2)

## 2020-07-17 LAB — COMPREHENSIVE METABOLIC PANEL
ALT: 31 U/L (ref 0–44)
AST: 35 U/L (ref 15–41)
Albumin: 4.2 g/dL (ref 3.5–5.0)
Alkaline Phosphatase: 79 U/L (ref 38–126)
Anion gap: 7 (ref 5–15)
BUN: 26 mg/dL — ABNORMAL HIGH (ref 8–23)
CO2: 27 mmol/L (ref 22–32)
Calcium: 9.3 mg/dL (ref 8.9–10.3)
Chloride: 102 mmol/L (ref 98–111)
Creatinine, Ser: 1.59 mg/dL — ABNORMAL HIGH (ref 0.61–1.24)
GFR, Estimated: 44 mL/min — ABNORMAL LOW (ref >60)
Glucose, Bld: 102 mg/dL — ABNORMAL HIGH (ref 70–99)
Potassium: 4.2 mmol/L (ref 3.5–5.1)
Sodium: 136 mmol/L (ref 135–145)
Total Bilirubin: 0.8 mg/dL (ref 0.3–1.2)
Total Protein: 7.9 g/dL (ref 6.5–8.1)

## 2020-07-17 LAB — RESP PANEL BY RT-PCR (FLU A&B, COVID) ARPGX2
Influenza A by PCR: NEGATIVE
Influenza B by PCR: NEGATIVE
SARS Coronavirus 2 by RT PCR: NEGATIVE

## 2020-07-17 LAB — APTT: aPTT: 33 seconds (ref 24–36)

## 2020-07-17 LAB — PROTIME-INR
INR: 1.8 — ABNORMAL HIGH (ref 0.8–1.2)
Prothrombin Time: 21 seconds — ABNORMAL HIGH (ref 11.4–15.2)

## 2020-07-17 LAB — RAPID URINE DRUG SCREEN, HOSP PERFORMED
Amphetamines: NOT DETECTED
Barbiturates: NOT DETECTED
Benzodiazepines: NOT DETECTED
Cocaine: NOT DETECTED
Opiates: NOT DETECTED
Tetrahydrocannabinol: NOT DETECTED

## 2020-07-17 LAB — CBG MONITORING, ED: Glucose-Capillary: 98 mg/dL (ref 70–99)

## 2020-07-17 LAB — ETHANOL: Alcohol, Ethyl (B): <10 mg/dL (ref <10)

## 2020-07-17 NOTE — Discharge Instructions (Addendum)
Very important that you follow-up with Dr. Gerilyn Pilgrim neurology.  He is on vacation this week but call and set up an appointment.  Return for any new or worse strokelike symptoms.  The symptoms you are having are consistent with mini strokes called transient ischemic attacks.  These can lead to big strokes.

## 2020-07-17 NOTE — ED Triage Notes (Signed)
Patient states that he woke this am with left side facial numbness that was present when he woke. States that he was normal when he went to bed last night. Currently on blood thinner for hx of CVA. No other deficits reported. States that he developed a headache yesterday with mild pain still felt behind left ear. Dr. Deretha Emory aware and to room to assess. No code stroke activated.

## 2020-07-17 NOTE — ED Provider Notes (Addendum)
[facility] EMERGENCY DEPARTMENT Provider Note   CSN: [id number] Arrival date & time: 07/17/20  0900  An emergency department physician performed an initial assessment on this suspected stroke patient at 0912.  History Chief Complaint  Patient presents with   Numbness    Gregory Davila is a 80 y.o. male.  Patient with a complaint of a headache yesterday.  Patient was at a funeral so was outside most of the day.  Patient went to bed and everything was okay other than the persistent headache at midnight.  Woke up this morning at about 7 in the morning.  And had left facial numbness.  Denied any visual changes speech problems or any weakness.  Patient actually walked 2 miles as part of his normal routine thought it would go away but it did not so he came in.  Patient had acute stroke in March.  Patient has a history of atrial fibrillation.  So patient is on Coumadin.  Patient also apparently had a history of pulmonary embolism.  Patient has a history of coronary artery disease.  And chronic renal insufficiency.  History of hypertension.  The CVA in March also involved numbness to upper extremity.  It sounds as if symptoms all resolved within 24 hours.  Patient was admitted.  Patient states that the headache is almost completely resolved.      Past Medical History:  Diagnosis Date   Anginal pain (HCC)    CAD (coronary artery disease)    a, 2011: occluded LAD with left to right collaterals and moderate disease in the right coronary artery and circumflex, EF 50%. b. NSTEMI 07/2011 in setting of AF-RVR, MV abnl, cath w/ LAD 100%, RCA mod-severe dz, small vessel, med rx   Chronic renal insufficiency    followed by primary care physician   Gout    Hypertension    long-standing   Myocardial infarction [facility])    Paroxysmal atrial fibrillation (HCC)    a. On Coumadin therapy/amidarone. b. 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   Pulmonary embolism (HCC)     Stroke [facility])     Patient Active Problem List   Diagnosis Date Noted   Acute ischemic stroke (HCC) 03/04/2020   CVA (cerebral vascular accident) (HCC) 03/03/2020   Hypothyroid 03/03/2020   Subtherapeutic international normalized ratio (INR) 03/03/2020   Pseudoaneurysm following procedure (HCC) 09/21/2018   CKD (chronic kidney disease) stage 3, GFR 30-59 ml/min (HCC) 07/21/2018   Neck pain 07/21/2018   Atherosclerotic heart disease of native coronary artery without angina pectoris 07/21/2018   Gout 02/16/2017   History of pulmonary embolism 02/16/2017   Essential hypertension 02/16/2017   Chronic combined systolic and diastolic CHF (congestive heart failure) (HCC) 01/27/2016   Pure hypercholesterolemia 01/27/2016   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   History of non-ST elevation myocardial infarction (NSTEMI) 07/13/2011   Atrial fibrillation with RVR (HCC) 07/12/2011   Persistent atrial fibrillation (HCC)    Coronary artery disease involving native coronary artery of native heart without angina pectoris    Hypertension    Renal insufficiency    Pulmonary embolism (HCC)    History of amiodarone therapy 07/27/2010   Long term (current) use of anticoagulants 04/13/2010   EDEMA 11/20/2009   Chronic rhinitis 02/04/2009   GERD (gastroesophageal reflux disease) 11/05/2006    Past Surgical History:  Procedure Laterality Date   APPENDECTOMY     ATRIAL FIBRILLATION ABLATION N/A 09/20/2018   Procedure: ATRIAL FIBRILLATION ABLATION;  Surgeon: Elberta Fortis,  Andree CossWill Martin, MD;  Location: [facility] INVASIVE CV LAB;  Service: Cardiovascular;  Laterality: N/A;   CARDIAC CATHETERIZATION  2011 & 2013   [place], LAD 100%, CFX 50%, RCA 80%, small vessel, med rx   CORONARY ARTERY BYPASS GRAFT N/A 02/14/2014   Procedure: CORONARY ARTERY BYPASS GRAFTING (CABG)X4 LIMA-LAD; SVG-RCA(ENDARDERECTOMY); SVG-OM; SVG-DIAG;  Surgeon: Kerin PernaPeter Van Trigt, MD;  Location: [facility] OR;  Service: Open Heart Surgery;  Laterality: N/A;   FALSE  ANEURYSM REPAIR Right 09/22/2018   Procedure: REPAIR FEMORAL PSEUDO ANEURYSM AND ARTERIOVENOUS FISTULA;  Surgeon: Sherren KernsFields, Charles E, MD;  [facility];  Service: Vascular;  Laterality: Right;   LEFT HEART CATHETERIZATION WITH CORONARY ANGIOGRAM N/A 02/08/2014   Procedure: LEFT HEART CATHETERIZATION WITH CORONARY ANGIOGRAM;  Surgeon: Micheline ChapmanMichael D Cooper, MD;  [facility];  Service: Cardiovascular;  Laterality: N/A;   NM MYOVIEW LTD  2013   Scar in the anteroseptal/periapical distribution with moderate peri-infarct ischemia, EF 43%   STERNAL INCISION RECLOSURE N/A 02/28/2014   Procedure: Irrigation and Debridement of Sternum with Sternal Rewiring;  Surgeon: Kerin PernaPeter Van Trigt, MD;  [facility];  Service: Thoracic;  Laterality: N/A;   TEE WITHOUT CARDIOVERSION N/A 02/14/2014   Procedure: TRANSESOPHAGEAL ECHOCARDIOGRAM (TEE);  Surgeon: Kerin PernaPeter Van Trigt, MD;  [facility];  Service: Open Heart Surgery;  Laterality: N/A;   TOTAL KNEE ARTHROPLASTY Bilateral        Family History  Problem Relation Age of Onset   Heart attack Mother    Cancer Mother    Prostate cancer Father    Coronary artery disease Brother        MI    Social History   Tobacco Use   Smoking status: Former    Types: Cigarettes    Quit date: 01/04/1966    Years since quitting: 54.5   Smokeless tobacco: Former    Types: Chew    Quit date: 01/05/2007   Tobacco comments:    quit chewing tobacco about 4 yrs ago  Vaping Use   Vaping Use: Never used  Substance Use Topics   Alcohol use: Yes    Comment: "seldomly drink beer" 1 every 2-3 weeks   Drug use: No    Home Medications Prior to Admission medications   Medication Sig Start Date End Date Taking? Authorizing Provider  allopurinol (ZYLOPRIM) 100 MG tablet Take 100 mg by mouth 2 (two) times daily. 05/08/19   [provider]  atorvastatin (LIPITOR) 40 MG tablet Take 1 tablet (40 mg total) by mouth daily at 6 PM. 03/05/14   Thomasena Edisollins, Almira CoasterGina L, PA-C  calcium  carbonate (TUMS - DOSED IN MG ELEMENTAL CALCIUM) 500 MG chewable tablet Chew 2 tablets by mouth as needed for indigestion or heartburn.     [provider]  carvedilol (COREG) 12.5 MG tablet Take 1 tablet (12.5 mg total) by mouth 2 (two) times daily. Please keep 07/21/2020 appointment for further refills 05/30/20   Tonny Bollmanooper, Michael, MD  chlorthalidone (HYGROTON) 25 MG tablet Take 1 tablet (25 mg total) by mouth daily. Please make yearly appt with Dr. Elberta Fortisamnitz for June 2022 for future refills. Thank you 1st attempt 04/21/20   Regan Lemmingamnitz, Will Martin, MD  Cholecalciferol (VITAMIN D3) 50 MCG (2000 UT) TABS Take 1 tablet by mouth every other day.    [provider]  COLCRYS 0.6 MG tablet Take 0.6 mg by mouth as needed (gout).  11/24/10   [provider]  diltiazem (CARDIZEM) 30 MG tablet Take 1  tablet (30 mg total) by mouth daily as needed. 06/24/20   Graciella Freer, PA-C  fluticasone (FLONASE) 50 MCG/ACT nasal spray Place 1 spray into both nostrils daily as needed for allergies or rhinitis.    [provider]  levothyroxine (SYNTHROID) 50 MCG tablet Take 50 mcg by mouth daily before breakfast.  08/30/18 06/24/20  [provider]  montelukast (SINGULAIR) 10 MG tablet Take 10 mg by mouth at bedtime. 05/09/19   [provider]  multivitamin [facility]) per tablet Take 1 tablet by mouth daily.    [provider]  Naphazoline HCl (CLEAR EYES OP) Place 1 drop into both eyes daily as needed (irritation).    [provider]  potassium citrate (UROCIT-K) 10 MEQ (1080 MG) SR tablet Take 10 mEq by mouth in the morning and at bedtime. 02/29/20   [provider]  warfarin (COUMADIN) 5 MG tablet TAKE 1 OR 2 TABLETS BY MOUTH ONCE DAILY OR AS DIRECTED 03/25/20   Camnitz, Andree Coss, MD    Allergies    Azithromycin and Oxycodone  Review of Systems   Review of Systems  Constitutional:  Negative for chills and fever.  HENT:  Negative for ear  pain and sore throat.   Eyes:  Negative for pain and visual disturbance.  Respiratory:  Negative for cough and shortness of breath.   Cardiovascular:  Negative for chest pain and palpitations.  Gastrointestinal:  Negative for abdominal pain and vomiting.  Genitourinary:  Negative for dysuria and hematuria.  Musculoskeletal:  Negative for arthralgias and back pain.  Skin:  Negative for color change and rash.  Neurological:  Positive for numbness and headaches. Negative for dizziness, seizures, syncope, facial asymmetry, speech difficulty and weakness.  All other systems reviewed and are negative.  Physical Exam Updated Vital Signs BP (!) 140/95 (BP Location: Left Arm)   Pulse 73   Temp 97.9 F (36.6 C) (Oral)   Resp 18   Ht 1.778 m (5\' 10" )   Wt 89.4 kg   SpO2 92%   BMI 28.27 kg/m   Physical Exam Vitals and nursing note reviewed.  Constitutional:      General: He is not in acute distress.    Appearance: Normal appearance. He is well-developed. He is not ill-appearing.  HENT:     Head: Normocephalic and atraumatic.     Mouth/Throat:     Mouth: Mucous membranes are moist.  Eyes:     Extraocular Movements: Extraocular movements intact.     Conjunctiva/sclera: Conjunctivae normal.     Pupils: Pupils are equal, round, and reactive to light.  Cardiovascular:     Rate and Rhythm: Normal rate and regular rhythm.     Heart sounds: No murmur heard. Pulmonary:     Effort: Pulmonary effort is normal. No respiratory distress.     Breath sounds: Normal breath sounds.  Abdominal:     Palpations: Abdomen is soft.     Tenderness: There is no abdominal tenderness.  Musculoskeletal:        General: No swelling.     Cervical back: Normal range of motion and neck supple. No rigidity.     Right lower leg: No edema.     Left lower leg: No edema.  Skin:    General: Skin is warm and dry.     Capillary Refill: Capillary refill takes less than 2 seconds.  Neurological:     General: No  focal deficit present.     Mental Status: He is alert and  oriented to person, place, and time.     Cranial Nerves: Cranial nerve deficit present.     Sensory: No sensory deficit.     Motor: No weakness.     Coordination: Coordination normal.     Comments: Left facial numbness no facial weakness    ED Results / Procedures / Treatments   Labs (all labs ordered are listed, but only abnormal results are displayed) Labs Reviewed  RESP PANEL BY RT-PCR (FLU A&B, COVID) ARPGX2  ETHANOL  PROTIME-INR  APTT  CBC  DIFFERENTIAL  COMPREHENSIVE METABOLIC PANEL  RAPID URINE DRUG SCREEN, HOSP PERFORMED  URINALYSIS, ROUTINE W REFLEX MICROSCOPIC  CBG MONITORING, ED  I-STAT CHEM 8, ED    EKG EKG Interpretation  Date/Time:  Thursday July 17 2020 09:13:23 EDT Ventricular Rate:  75 PR Interval:  169 QRS Duration: 99 QT Interval:  401 QTC Calculation: 448 R Axis:   54 Text Interpretation: Sinus rhythm Low voltage, precordial leads Consider anterior infarct Minimal ST elevation, inferior leads Confirmed by Vanetta Mulders [phone]) on 07/17/2020 9:22:56 AM  Radiology No results found.  Procedures Procedures   CRITICAL CARE Performed by: Vanetta Mulders Total critical care time: 45 minutes Critical care time was exclusive of separately billable procedures and treating other patients. Critical care was necessary to treat or prevent imminent or life-threatening deterioration. Critical care was time spent personally by me on the following activities: development of treatment plan with patient and/or surrogate as well as nursing, discussions with consultants, evaluation of patient's response to treatment, examination of patient, obtaining history from patient or surrogate, ordering and performing treatments and interventions, ordering and review of laboratory studies, ordering and review of radiographic studies, pulse oximetry and re-evaluation of patient's condition.   Medications Ordered in  ED Medications - No data to display  ED Course  I have reviewed the triage vital signs and the nursing notes.  Pertinent labs & imaging results that were available during my care of the patient were reviewed by me and considered in my medical decision making (see chart for details).    MDM Rules/Calculators/A&P                         Patient with onset of left facial numbness sometime during the night.  Awoke around 7 in the morning and it was present.  He went to bed around midnight.  Patient out of the window for tPA.  So will not activate full code stroke.  But will do stroke stroke order sets.  If head CT negative will get MRI brain.  Patient's EKG shows no evidence of atrial fib today.  But has a history of that.  Labs and initial head CT without any acute findings.  We will proceed with MRI which will determine disposition.  Dr. Gerilyn Pilgrim is available up until 8 PM tonight for consultation is neurology here.  Final Clinical Impression(s) / ED Diagnoses Final diagnoses:  Cerebrovascular accident (CVA), unspecified mechanism (HCC)    Rx / DC Orders ED Discharge Orders     None        Vanetta Mulders, MD 07/17/20 1252  Addendum: Patient's symptoms resolved approximately 1 to 2 hours ago.  Discussed with Dr. Jerrell Belfast who is actually covering for Dr. Gerilyn Pilgrim through teleneurology because Dr. Gerilyn Pilgrim is on vacation.  He reviewed everything and patient is okay for discharge home and follow-up with neurology.  MRI negative and symptoms all resolved.  Consistent with TIA.   Ramin Zoll,  Lorin Picket, MD 07/17/20 1512

## 2020-07-17 NOTE — ED Notes (Signed)
Patient to MRI at this time.

## 2020-07-20 NOTE — Progress Notes (Signed)
Cardiology Office Note:    Date:  07/21/2020   ID:  [name], DOB [birth year], MRN [id number]  PCP:  Gregory Rio, MD   [facility] Providers Cardiologist:  Gregory Mocha, MD Electrophysiologist:  Gregory Haw, MD      Referring MD: Gregory Rio, MD   Chief Complaint:  Follow-up (CAD, A. fib)    Patient Profile:    Gregory Davila is a 80 y.o. male with:  Coronary artery disease NSTEMI s/p CABG in 02/2014 Paroxysmal atrial fibrillation Apixaban >> Warfarin 2/2 $$ S/p PVI ablation in 09/2018 C/b RFA pseudoaneurysm and AV fistula requiring repair HFimpEF (heart failure with improved ejection fraction)  Ischemic cardiomyopathy  Echo 2/16: 40-45 Echo 3/22: EF 55-60 Hypertension Hyperlipidemia Chronic kidney disease Gout History of pulmonary embolism S/p  CVA 02/2020 S/p TIA in 07/2020  Prior CV studies: Echocardiogram 03/04/2020 EF 55-60, no RWMA, mild LVH, GR 1 DD, normal RVSF, trivial MR, mild AI  Echocardiogram 07/19/2019 EF 50-55  Echocardiogram 07/13/2018 EF 55-60  LHC 2/16 LM patent with irregularity but no significant stenosis. LAD prox 100, mid and dist fill by L-L collats, Dx fills via collats LCx mid 90 with ulcerated plaque RCA mid 80, dist 100 EF 45   Carotid US 2/16 Bilat ICA 1-39   Echo 2/16 Mod LVH, EF 40-45, septal HK, Gr 1 DD, mild AI, mild MAC, mild MR   Myoview 7/13 Ant-septal/periapical infarct with peri-infarct ischemia, EF 43   History of Present Illness: Gregory Davila was last seen by Dr. Burt Davila in 7/21.   He was admitted in 02/2020 with an acute left MCA territory stroke.  Of note, his INR was subtherapeutic upon admission.  He was seen in the emergency room recently on 07/17/2020 with left facial numbness felt to be consistent with a TIA.  CT of his head was negative for hemorrhage.  MRI demonstrated no acute abnormality.  It was felt that his symptoms were consistent with transient ischemic attack.  Of note, his INR  upon presentation was 1.8.  He returns for cardiology follow-up.  He is here alone. He has not had any chest discomfort, shortness of breath, syncope, orthopnea, leg edema. He has not had any recurrent stroke or TIA symptoms.  I reviewed his notes from his admission to the hospital in March. His neck CTA demonstrated 50% left ICA stenosis. He had evidence of infarct in the left MCA territory. He has remained in sinus rhythm. Neurology recommended aspirin and clopidogrel for 90 days then aspirin alone. He was taken off of aspirin and clopidogrel. He has remained on warfarin and is INRs have been subtherapeutic when he presented for his strokes/TIAs.     Past Medical History:  Diagnosis Date   Anginal pain ([place])    CAD (coronary artery disease)    a, 2011: occluded LAD with left to right collaterals and moderate disease in the right coronary artery and circumflex, EF 50%. b. NSTEMI 07/2011 in setting of AF-RVR, MV abnl, cath w/ LAD 100%, RCA mod-severe dz, small vessel, med rx   Chronic renal insufficiency    followed by primary care physician   Gout    Hypertension    long-standing   Myocardial infarction [facility])    Paroxysmal atrial fibrillation ([place])    a. On Coumadin therapy/amidarone. b. 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   Pulmonary embolism (H[facility])    Stroke [facility])     Current Medications: Current Meds  Medication  Sig   allopurinol (ZYLOPRIM) 100 MG tablet Take 100 mg by mouth daily.   aspirin EC 81 MG tablet Take 1 tablet (81 mg total) by mouth daily. Swallow whole.   atorvastatin (LIPITOR) 40 MG tablet Take 1 tablet (40 mg total) by mouth daily at 6 PM.   calcium carbonate (TUMS - DOSED IN MG ELEMENTAL CALCIUM) 500 MG chewable tablet Chew 2 tablets by mouth as needed for indigestion or heartburn.    carvedilol (COREG) 12.5 MG tablet Take 1 tablet (12.5 mg total) by mouth 2 (two) times daily. Please keep 07/21/2020 appointment for further refills    chlorthalidone (HYGROTON) 25 MG tablet Take 1 tablet (25 mg total) by mouth daily. Please make yearly appt with Dr. Curt Bears for June 2022 for future refills. Thank you 1st attempt   Cholecalciferol (VITAMIN D3) 50 MCG (2000 UT) TABS Take 1 tablet by mouth daily at 6 (six) AM.   COLCRYS 0.6 MG tablet Take 0.6 mg by mouth as needed (gout).    diltiazem (CARDIZEM) 30 MG tablet Take 1 tablet (30 mg total) by mouth daily as needed.   fluticasone (FLONASE) 50 MCG/ACT nasal spray Place 1 spray into both nostrils daily.   levothyroxine (SYNTHROID) 50 MCG tablet Take 50 mcg by mouth daily before breakfast.    montelukast (SINGULAIR) 10 MG tablet Take 10 mg by mouth at bedtime.   multivitamin (THERAGRAN) per tablet Take 1 tablet by mouth daily.   Naphazoline HCl (CLEAR EYES OP) Place 1 drop into both eyes daily as needed (irritation).   potassium citrate (UROCIT-K) 10 MEQ (1080 MG) SR tablet Take 10 mEq by mouth in the morning and at bedtime.   warfarin (COUMADIN) 5 MG tablet TAKE 1 OR 2 TABLETS BY MOUTH ONCE DAILY OR AS DIRECTED     Allergies:   Azithromycin and Oxycodone   Social History   Tobacco Use   Smoking status: Former    Types: Cigarettes    Quit date: 01/04/1966    Years since quitting: 54.5   Smokeless tobacco: Former    Types: Chew    Quit date: 01/05/2007   Tobacco comments:    quit chewing tobacco about 4 yrs ago  Vaping Use   Vaping Use: Never used  Substance Use Topics   Alcohol use: Yes    Comment: "seldomly drink beer" 1 every 2-3 weeks   Drug use: No     Family Hx: The patient's family history includes Cancer in his mother; Coronary artery disease in his brother; Heart attack in his mother; Prostate cancer in his father.  Review of systems He has not had melena, hematochezia, hematuria   EKGs/Labs/Other Test Reviewed:    EKG:  EKG is not ordered today.  The ekg ordered today demonstrates n/a  Recent Labs: 03/03/2020: TSH 4.615 07/17/2020: ALT 31; BUN 26;  Creatinine, Ser 1.59; Hemoglobin 15.9; Platelets 209; Potassium 4.2; Sodium 136   Recent Lipid Panel Lab Results  Component Value Date/Time   CHOL 133 03/04/2020 04:35 AM   CHOL 135 03/24/2017 12:47 PM   TRIG 194 (H) 03/04/2020 04:35 AM   HDL 31 (L) 03/04/2020 04:35 AM   HDL 41 03/24/2017 12:47 PM   LDLCALC 63 03/04/2020 04:35 AM   LDLCALC 75 03/24/2017 12:47 PM      Risk Assessment/Calculations:    CHA2DS2-VASc Score = 7  This indicates a 11.2% annual risk of stroke. The patient's score is based upon: CHF History: Yes HTN History: Yes Diabetes History: No Stroke History:  Yes Vascular Disease History: Yes Age Score: 2 Gender Score: 0    Physical Exam:    VS:  BP 112/60   Pulse 69   Ht _0  (1.778 m)   Wt 200 lb 12.8 oz (91.1 kg)   SpO2 96%   BMI 28.81 kg/m     Wt Readings from Last 3 Encounters:  07/21/20 200 lb 12.8 oz (91.1 kg)  07/17/20 197 lb (89.4 kg)  06/24/20 200 lb 12.8 oz (91.1 kg)     Exam General:  no acute distress Neck: no JVD Cardiac:  regular rate and rhythm, no murmur Lungs: clear to auscultation bilateral without wheezing rhonchi or rales Abdomen: soft nontender Neurologic:  without deficit Extremities: no LE edema  Skin: warm and dry     ASSESSMENT & PLAN:    History of recurrent stroke He had 50% ICA stenosis on the left on CT when he was admitted in March. He also had multiple severe stenoses in the L MCA branch vessels on MRA.  He was in sinus rhythm. His INR was subtherapeutic. However, as he was in sinus rhythm, I doubt his stroke was related to atrial fibrillation. He did have multiple infarct territories in the left MCA distribution. I do think he should be on antiplatelet therapy in addition to his warfarin. He is getting referred to neurology which I think is a good idea. I will review with Dr. Burt Davila to see if we should refer him to vascular surgery. As he does not have high-grade disease on the left and had branch vessel  disease on MRA, I am not certain that this would be necessary.   Coronary artery disease History of non-STEMI in 2016 followed by CABG. He is not having anginal symptoms. Continue atorvastatin, carvedilol. Follow-up 6 months  Paroxysmal atrial fibrillation History of PVI ablation in 2020. He is on chronic warfarin therapy. He had difficulty affording Eliquis. We will see if we can find a way to get Eliquis cheaper for him. He does need strict control of his INR as well on warfarin to keep this greater than 2. Labs from 07/17/2020 demonstrate stable creatinine at 1.59 and hemoglobin 15.9.  Hypertension Blood pressure controlled. Continue current dose of carvedilol, chlorthalidone.  Hyperlipidemia Review of his labs from his PCP through care everywhere demonstrates an LDL of 68 on 05/15/2020. Continue current dose of atorvastatin.    Chronic kidney disease Creatinine has remained stable.   Dispo:  Return in about 6 months (around 01/21/2021) for Routine follow up in 6 months with Dr.Cooper or [place] Dopp, PA-C..   Medication Adjustments/Labs and Tests Ordered: Current medicines are reviewed at length with the patient today.  Concerns regarding medicines are outlined above.  Tests Ordered: No orders of the defined types were placed in this encounter.  Medication Changes: Meds ordered this encounter  Medications   aspirin EC 81 MG tablet    Sig: Take 1 tablet (81 mg total) by mouth daily. Swallow whole.    Dispense:  90 tablet    Refill:  3    Signed, [place] Dopp, PA-C  07/21/2020 6:00 PM    [facility] [place], [place], [place]  [address] Phone: [phone]; Fax: [phone]

## 2020-07-21 ENCOUNTER — Other Ambulatory Visit: Payer: Self-pay

## 2020-07-21 ENCOUNTER — Encounter: Payer: Self-pay | Admitting: Physician Assistant

## 2020-07-21 ENCOUNTER — Ambulatory Visit (INDEPENDENT_AMBULATORY_CARE_PROVIDER_SITE_OTHER): Payer: Medicare Other | Admitting: Physician Assistant

## 2020-07-21 VITALS — BP 112/60 | HR 69 | Ht 70.0 in | Wt 200.8 lb

## 2020-07-21 DIAGNOSIS — E7800 Pure hypercholesterolemia, unspecified: Secondary | ICD-10-CM

## 2020-07-21 DIAGNOSIS — I480 Paroxysmal atrial fibrillation: Secondary | ICD-10-CM

## 2020-07-21 DIAGNOSIS — Z8673 Personal history of transient ischemic attack (TIA), and cerebral infarction without residual deficits: Secondary | ICD-10-CM | POA: Diagnosis not present

## 2020-07-21 DIAGNOSIS — N1831 Chronic kidney disease, stage 3a: Secondary | ICD-10-CM

## 2020-07-21 DIAGNOSIS — I10 Essential (primary) hypertension: Secondary | ICD-10-CM

## 2020-07-21 DIAGNOSIS — I2510 Atherosclerotic heart disease of native coronary artery without angina pectoris: Secondary | ICD-10-CM

## 2020-07-21 DIAGNOSIS — I5032 Chronic diastolic (congestive) heart failure: Secondary | ICD-10-CM

## 2020-07-21 MED ORDER — ASPIRIN EC 81 MG PO TBEC: 81.0000 mg | DELAYED_RELEASE_TABLET | Freq: Every day | ORAL | 3 refills | Status: DC

## 2020-07-21 NOTE — Patient Instructions (Addendum)
Medication Instructions:    START Asprin one tablet by mouth ( 81 mg) daily. This is over the counter.   *If you need a refill on your cardiac medications before your next appointment, please call your pharmacy*   Lab Work:  -NONE  If you have labs (blood work) drawn today and your tests are completely normal, you will receive your results only by: MyChart Message (if you have MyChart) OR A paper copy in the mail If you have any lab test that is abnormal or we need to change your treatment, we will call you to review the results.  Testing/Procedures:  -NONE   Follow-Up: At [facility], you and your health needs are our priority.  As part of our continuing mission to provide you with exceptional heart care, we have created designated Provider Care Teams.  These Care Teams include your primary Cardiologist (physician) and Advanced Practice Providers (APPs -  Physician Assistants and Nurse Practitioners) who all work together to provide you with the care you need, when you need it.  We recommend signing up for the patient portal called "MyChart".  Sign up information is provided on this After Visit Summary.  MyChart is used to connect with patients for Virtual Visits (Telemedicine).  Patients are able to view lab/test results, encounter notes, upcoming appointments, etc.  Non-urgent messages can be sent to your provider as well.   To learn more about what you can do with MyChart, go to [email or web address].    Your next appointment:   6 month(s)  The format for your next appointment:   In Person  Provider:   You may see Tonny Bollman, MD or one of the following Advanced Practice Providers on your designated Care Team:   Tereso Newcomer, PA-C  Your physician wants you to follow-up in: 6 months with Dr. Excell Seltzer or Tereso Newcomer, PA-C.  You will receive a reminder letter in the mail two months in advance. If you don't receive a letter, please call our office to schedule the follow-up  appointment.   Other Instructions  Lorin Picket wanted to check on the price of Eliquis for patient.  Will give patient an assistance form today to call Eliquis and check on price and a message was sent to [employer].

## 2020-07-22 ENCOUNTER — Telehealth: Payer: Self-pay | Admitting: Physician Assistant

## 2020-07-22 ENCOUNTER — Other Ambulatory Visit: Payer: Self-pay

## 2020-07-22 MED ORDER — CHLORTHALIDONE 25 MG PO TABS: 25.0000 mg | ORAL_TABLET | Freq: Every day | ORAL | 3 refills | Status: DC

## 2020-07-22 NOTE — Telephone Encounter (Signed)
I spoke to the pt about whether or not we should send him to a vascular surgeon at his appt with me.  I reviewed his chart again and also reviewed with Dr. Excell Seltzer.   He does NOT need to see vascular surgery.   Continue f/u as planned. Tereso Newcomer, PA-C    07/22/2020 5:38 PM

## 2020-07-22 NOTE — Progress Notes (Signed)
Thanks [employer]. I think with the intracranial disease there is probably no indication to send him to vascular since his carotid is non-occlusive. I would continue with medical management. Thx Kathlene November

## 2020-07-23 NOTE — Telephone Encounter (Signed)
S/w pt is aware does NOT need to see vascular surgery per Dr.Cooper.

## 2020-07-31 ENCOUNTER — Other Ambulatory Visit: Payer: Self-pay

## 2020-07-31 ENCOUNTER — Ambulatory Visit (INDEPENDENT_AMBULATORY_CARE_PROVIDER_SITE_OTHER): Payer: Medicare Other | Admitting: *Deleted

## 2020-07-31 DIAGNOSIS — Z5181 Encounter for therapeutic drug level monitoring: Secondary | ICD-10-CM

## 2020-07-31 DIAGNOSIS — I4819 Other persistent atrial fibrillation: Secondary | ICD-10-CM

## 2020-08-01 ENCOUNTER — Other Ambulatory Visit: Payer: Self-pay | Admitting: Cardiovascular Disease

## 2020-10-09 ENCOUNTER — Ambulatory Visit: Payer: Medicare Other | Admitting: Neurology

## 2021-01-08 ENCOUNTER — Other Ambulatory Visit: Payer: Self-pay | Admitting: Cardiovascular Disease

## 2021-02-02 NOTE — Progress Notes (Signed)
Office Visit    Patient Name: Gregory Davila Date of Encounter: 02/03/2021  PCP:  Leeanne Rio, [place]  Cardiologist:  Sherren Mocha, MD  Advanced Practice Provider:  No care team member to display Electrophysiologist:  Will Meredith Leeds, MD    Chief Complaint    Gregory Davila is a 81 y.o. male with a hx of CAD (NSTEMI status post CABG in 02/2014), PAF (on chronic anticoagulation with Coumadin status post PVI ablation in 04/8887 complicated by RFA pseudoaneurysm and AV fistula requiring repair), HFimpEF, ischemic cardiomyopathy, hypertension, hyperlipidemia, chronic kidney disease, gout, history of PE, status post CVA 02/2018, and status post TIA in 07/2018 who presents today for 81-monthfollow-up appointment.  The patient saw Dr. CBurt Knack7/21.  He was admitted 02/2020 with acute left MCA territory stroke.  Of note, his INR was subtherapeutic upon admission.  He was seen in the emergency room on 07/17/2020 with left facial numbness felt to be consistent with TIA.  CT of the head was negative for hemorrhage.  MRI demonstrated no acute abnormality.  It was felt that his symptoms were consistent with TIA.  Of note, his INR upon presentation was 1.8.  He was seen in the office 07/21/2020 by SRichardson Dopp PA.  He had not had any chest discomfort, shortness of breath, syncope, orthopnea, leg edema.  He has not had any recent stroke or TIA symptoms.  His neck CTA from his admission demonstrated 50% left ICA stenosis.  Neurology recommended aspirin/Plavix for 90 days and then aspirin alone.  He has already been taken off of aspirin and Plavix.  He has remained on Coumadin and his INRs have been subtherapeutic.  Today, he shares that he is off Coumadin and on Eliquis now.  He has not had any cardiac symptoms.  He denies lightheadedness, dizziness, chest pain, and shortness of breath.  He states he sees his kidney doctor in March and his primary care doctor in May.   Usually his primary care doctor orders a bunch of labs including a lipid panel.  He does not report any falls and has been getting around okay with a cane.  He usually does more walking when the weather is better outside.  He usually tries to walk 1 to 2 miles a day.  He recently had a birthday and turned 81years old.  He states his blood pressure might be a little elevated since he ate some extra salty foods.  Reports no shortness of breath nor dyspnea on exertion. Reports no chest pain, pressure, or tightness. No edema, orthopnea, PND. Reports no palpitations.    Past Medical History    Past Medical History:  Diagnosis Date   Anginal pain ([place]    CAD (coronary artery disease)    a, 2011: occluded LAD with left to right collaterals and moderate disease in the right coronary artery and circumflex, EF 50%. b. NSTEMI 07/2011 in setting of AF-RVR, MV abnl, cath w/ LAD 100%, RCA mod-severe dz, small vessel, med rx   Chronic renal insufficiency    followed by primary care physician   Gout    Hypertension    long-standing   [facility]ocardial infarction ([facility]    [place]    a. On Coumadin therapy/amidarone. b. 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   [place]    St[facility] ([facility]    Past Surgical History:  Procedure Laterality Date  APPENDECTOMY     ATRIAL FIBRILLATION ABLATION N/A 09/20/2018   Procedure: ATRIAL FIBRILLATION ABLATION;  Surgeon: Constance Haw, MD;  Location: [place] [facility];  Service: Cardiovascular;  Laterality: N/A;   CARDIAC CATHETERIZATION  2011 & 2013   2013 L [place], LAD 100%, CFX 50%, RCA 80%, small vessel, med rx   CORONARY ARTERY BYPASS GRAFT N/A 02/14/2014   Procedure: CORONARY ARTERY BYPASS GRAFTING (CABG)X4 LIMA-LAD; SVG-RCA(ENDARDERECTOMY); SVG-OM; SVG-DIAG;  Surgeon: Ivin Poot, MD;  Location: [place];  Service: Open Heart Surgery;  Laterality: N/A;   FALSE ANEURYSM REPAIR Right 09/22/2018    Procedure: REPAIR FEMORAL PSEUDO ANEURYSM AND ARTERIOVENOUS FISTULA;  Surgeon: Elam Dutch, MD;  Location: [place];  Service: Vascular;  Laterality: Right;   LEFT HEART CATHETERIZATION WITH CORONARY ANGIOGRAM N/A 02/08/2014   Procedure: LEFT HEART CATHETERIZATION WITH CORONARY ANGIOGRAM;  Surgeon: Blane Ohara, MD;  Location: [facility] CATH LAB;  Service: Cardiovascular;  Laterality: N/A;   NM MYOVIEW LTD  2013   Scar in the anteroseptal/periapical distribution with moderate peri-infarct ischemia, EF 43%   STERNAL INCISION RECLOSURE N/A 02/28/2014   Procedure: Irrigation and Debridement of Sternum with Sternal Rewiring;  Surgeon: Ivin Poot, MD;  [facility] OR;  Service: Thoracic;  Laterality: N/A;   TEE WITHOUT CARDIOVERSION N/A 02/14/2014   Procedure: TRANSESOPHAGEAL ECHOCARDIOGRAM (TEE);  Surgeon: Ivin Poot, MD;  Location: [place];  Service: Open Heart Surgery;  Laterality: N/A;   TOTAL KNEE ARTHROPLASTY Bilateral     Allergies  Allergies  Allergen Reactions   Azithromycin Nausea And Vomiting    "throws for a loop"   Oxycodone Other (See Comments)    nightmares      EKGs/Labs/Other Studies Reviewed:   The following studies were reviewed today:   Echocardiogram 03/04/2020 EF 55-60, no RWMA, mild LVH, GR 1 DD, normal RVSF, trivial MR, mild AI   Echocardiogram 07/19/2019 EF 50-55   Echocardiogram 07/13/2018 EF 55-60   LHC 2/16 LM patent with irregularity but no significant stenosis. LAD prox 100, mid and dist fill by L-L collats, Dx fills via collats LCx mid 90 with ulcerated plaque RCA mid 80, dist 100 EF 45   Carotid US 2/16 Bilat ICA 1-39   Echo 2/16 Mod LVH, EF 40-45, septal HK, Gr 1 DD, mild AI, mild MAC, mild MR   Myoview 7/13 Ant-septal/periapical infarct with peri-infarct ischemia, EF 43    EKG:  EKG is not ordered today.   Recent Labs: 03/03/2020: TSH 4.615 07/17/2020: ALT 31; BUN 26; Creatinine, Ser 1.59; Hemoglobin 15.9; Platelets 209;  Potassium 4.2; Sodium 136  Recent Lipid Panel    Component Value Date/Time   CHOL 133 03/04/2020 0435   CHOL 135 03/24/2017 1247   TRIG 194 (H) 03/04/2020 0435   HDL 31 (L) 03/04/2020 0435   HDL 41 03/24/2017 1247   CHOLHDL 4.3 03/04/2020 0435   VLDL 39 03/04/2020 0435   LDLCALC 63 03/04/2020 0435   LDLCALC 75 03/24/2017 1247    Risk Assessment/Calculations:   CHA2DS2-VASc Score = 7   This indicates a 11.2% annual risk of stroke. The patient's score is based upon: CHF History: 1 HTN History: 1 Diabetes History: 0 Stroke History: 2 Vascular Disease History: 1 Age Score: 2 Gender Score: 0     Home Medications   Current Meds  Medication Sig   allopurinol (ZYLOPRIM) 100 MG tablet Take 100 mg by mouth daily.   aspirin EC 81 MG tablet Take 1 tablet (81  mg total) by mouth daily. Swallow whole.   atorvastatin (LIPITOR) 40 MG tablet Take 1 tablet (40 mg total) by mouth daily at 6 PM.   calcium carbonate (TUMS - DOSED IN MG ELEMENTAL CALCIUM) 500 MG chewable tablet Chew 2 tablets by mouth as needed for indigestion or heartburn.    carvedilol (COREG) 12.5 MG tablet TAKE ONE TABLET BY MOUTH TWICE DAILY   chlorthalidone (HYGROTON) 25 MG tablet Take 1 tablet (25 mg total) by mouth daily. .   Cholecalciferol (VITAMIN D3) 50 MCG (2000 UT) TABS Take 1 tablet by mouth daily at 6 (six) AM.   COLCRYS 0.6 MG tablet Take 0.6 mg by mouth as needed (gout).    diltiazem (CARDIZEM) 30 MG tablet Take 1 tablet (30 mg total) by mouth daily as needed.   ELIQUIS 5 MG TABS tablet Take 5 mg by mouth 2 (two) times daily.   fluticasone (FLONASE) 50 MCG/ACT nasal spray Place 1 spray into both nostrils daily.   montelukast (SINGULAIR) 10 MG tablet Take 10 mg by mouth at bedtime.   multivitamin (THERAGRAN) per tablet Take 1 tablet by mouth daily.   Naphazoline HCl (CLEAR EYES OP) Place 1 drop into both eyes daily as needed (irritation).   potassium citrate (UROCIT-K) 10 MEQ (1080 MG) SR tablet Take 10 mEq  by mouth in the morning and at bedtime.     Review of Systems      All other systems reviewed and are otherwise negative except as noted above.  Physical Exam    VS:  BP 124/80 (BP Location: Left Arm, Patient Position: Sitting, Cuff Size: Normal)    Pulse 71    Ht _0  (1.778 m)    Wt 211 lb 12.8 oz (96.1 kg)    SpO2 96%    BMI 30.39 kg/m  , BMI Body mass index is 30.39 kg/m.  Wt Readings from Last 3 Encounters:  02/03/21 211 lb 12.8 oz (96.1 kg)  07/21/20 200 lb 12.8 oz (91.1 kg)  07/17/20 197 lb (89.4 kg)     GEN: Well nourished, well developed, in no acute distress. HEENT: normal. Neck: Supple, no JVD, carotid bruits, or masses. Cardiac: RRR, no murmurs, rubs, or gallops. No clubbing, cyanosis, edema.  Radials/PT 2+ and equal bilaterally.  Respiratory:  Respirations regular and unlabored, clear to auscultation bilaterally. GI: Soft, nontender, nondistended. MS: No deformity or atrophy. Skin: Warm and dry, no rash. Neuro:  Strength and sensation are intact. Psych: Normal affect.  Assessment & Plan    History of stroke -Admitted Feb 2022 with multiple infarct territories in the left MCA distribution -He only remains on Eliquis for anticoagulation -No residual effects from stroke  Coronary artery disease -History of NSTEMI in 2016 followed by CABG -Asymptomatic at this time -Continue GDMT: Atorvastatin and carvedilol  Paroxysmal atrial fibrillation -Status post PVI ablation in 2020. -On chronic Eliquis treatment -No bleeding or falls   Hypertension -BP well controlled in the clinic today -He states that his BP is usually lower however, with his birthday he went off his diet slightly -Encouraged to continue monitoring BP at home  Hyperlipidemia -LDL of 68 on 05/15/2020 -Continue current dose of atorvastatin -Recommend follow-up lipid panel 5/23 with his PCP appointment  Chronic kidney disease -creatinine 1.59 (7/22) -looks like this is about is  baseline -continue to avoid nephrotoxic medications -He has an appointment with his nephrologist in March    Disposition: Follow up 1 year with Sherren Mocha, MD or APP.  Signed,  Elgie Collard, PA-C 02/03/2021, 9:29 AM [facility]

## 2021-02-03 ENCOUNTER — Other Ambulatory Visit: Payer: Self-pay

## 2021-02-03 ENCOUNTER — Encounter: Payer: Self-pay | Admitting: Physician Assistant

## 2021-02-03 ENCOUNTER — Ambulatory Visit: Payer: Medicare Other | Admitting: Physician Assistant

## 2021-02-03 VITALS — BP 124/80 | HR 71 | Ht 70.0 in | Wt 211.8 lb

## 2021-02-03 DIAGNOSIS — I480 Paroxysmal atrial fibrillation: Secondary | ICD-10-CM

## 2021-02-03 DIAGNOSIS — I10 Essential (primary) hypertension: Secondary | ICD-10-CM | POA: Diagnosis not present

## 2021-02-03 DIAGNOSIS — I639 Cerebral infarction, unspecified: Secondary | ICD-10-CM

## 2021-02-03 DIAGNOSIS — E785 Hyperlipidemia, unspecified: Secondary | ICD-10-CM

## 2021-02-03 DIAGNOSIS — I2510 Atherosclerotic heart disease of native coronary artery without angina pectoris: Secondary | ICD-10-CM

## 2021-02-03 NOTE — Patient Instructions (Addendum)
Medication Instructions:   Your physician recommends that you continue on your current medications as directed. Please refer to the Current Medication list given to you today.  *If you need a refill on your cardiac medications before your next appointment, please call your pharmacy*   Lab Work:  None ordered.  If you have labs (blood work) drawn today and your tests are completely normal, you will receive your results only by: MyChart Message (if you have MyChart) OR A paper copy in the mail If you have any lab test that is abnormal or we need to change your treatment, we will call you to review the results.   Testing/Procedures:  None ordered.    Follow-Up: At [facility], you and your health needs are our priority.  As part of our continuing mission to provide you with exceptional heart care, we have created designated Provider Care Teams.  These Care Teams include your primary Cardiologist (physician) and Advanced Practice Providers (APPs -  Physician Assistants and Nurse Practitioners) who all work together to provide you with the care you need, when you need it.  We recommend signing up for the patient portal called "MyChart".  Sign up information is provided on this After Visit Summary.  MyChart is used to connect with patients for Virtual Visits (Telemedicine).  Patients are able to view lab/test results, encounter notes, upcoming appointments, etc.  Non-urgent messages can be sent to your provider as well.   To learn more about what you can do with MyChart, go to [email or web address].    Your next appointment:   1 year(s)  The format for your next appointment:   In Person  Provider:   Tonny Bollman, MD     Other Instructions  Your physician wants you to follow-up in: 1 year with Dr.Cooper.  You will receive a reminder letter in the mail two months in advance. If you don't receive a letter, please call our office to schedule the follow-up appointment.    Heart-Healthy Eating Plan Heart-healthy meal planning includes: Eating less unhealthy fats. Eating more healthy fats. Making other changes in your diet. Talk with your doctor or a diet specialist (dietitian) to create an eating plan that is right for you. What is my plan? Your doctor may recommend an eating plan that includes: Total fat: ______% or less of total calories a day. Saturated fat: ______% or less of total calories a day. Cholesterol: less than _________mg a day. What are tips for following this plan? Cooking Avoid frying your food. Try to bake, boil, grill, or broil it instead. You can also reduce fat by: Removing the skin from poultry. Removing all visible fats from meats. Steaming vegetables in water or broth. Meal planning  At meals, divide your plate into four equal parts: Fill one-half of your plate with vegetables and green salads. Fill one-fourth of your plate with whole grains. Fill one-fourth of your plate with lean protein foods. Eat 4-5 servings of vegetables per day. A serving of vegetables is: 1 cup of raw or cooked vegetables. 2 cups of raw leafy greens. Eat 4-5 servings of fruit per day. A serving of fruit is: 1 medium whole fruit.  cup of dried fruit.  cup of fresh, frozen, or canned fruit.  cup of 100% fruit juice. Eat more foods that have soluble fiber. These are apples, broccoli, carrots, beans, peas, and barley. Try to get 20-30 g of fiber per day. Eat 4-5 servings of nuts, legumes, and seeds per week: 1 serving  of dried beans or legumes equals  cup after being cooked. 1 serving of nuts is  cup. 1 serving of seeds equals 1 tablespoon. General information Eat more home-cooked food. Eat less restaurant, buffet, and fast food. Limit or avoid alcohol. Limit foods that are high in starch and sugar. Avoid fried foods. Lose weight if you are overweight. Keep track of how much salt (sodium) you eat. This is important if you have high blood  pressure. Ask your doctor to tell you more about this. Try to add vegetarian meals each week. Fats Choose healthy fats. These include olive oil and canola oil, flaxseeds, walnuts, almonds, and seeds. Eat more omega-3 fats. These include salmon, mackerel, sardines, tuna, flaxseed oil, and ground flaxseeds. Try to eat fish at least 2 times each week. Check food labels. Avoid foods with trans fats or high amounts of saturated fat. Limit saturated fats. These are often found in animal products, such as meats, butter, and cream. These are also found in plant foods, such as palm oil, palm kernel oil, and coconut oil. Avoid foods with partially hydrogenated oils in them. These have trans fats. Examples are stick margarine, some tub margarines, cookies, crackers, and other baked goods. What foods can I eat? Fruits All fresh, canned (in natural juice), or frozen fruits. Vegetables Fresh or frozen vegetables (raw, steamed, roasted, or grilled). Green salads. Grains Most grains. Choose whole wheat and whole grains most of the time. Rice and pasta, including brown rice and pastas made with whole wheat. Meats and other proteins Lean, well-trimmed beef, veal, pork, and lamb. Chicken and [place] without skin. All fish and shellfish. Wild duck, rabbit, pheasant, and venison. Egg whites or low-cholesterol egg substitutes. Dried beans, peas, lentils, and tofu. Seeds and most nuts. Dairy Low-fat or nonfat cheeses, including ricotta and mozzarella. Skim or 1% milk that is liquid, powdered, or evaporated. Buttermilk that is made with low-fat milk. Nonfat or low-fat yogurt. Fats and oils Non-hydrogenated (trans-free) margarines. Vegetable oils, including soybean, sesame, sunflower, olive, peanut, safflower, corn, canola, and cottonseed. Salad dressings or mayonnaise made with a vegetable oil. Beverages Mineral water. Coffee and tea. Diet carbonated beverages. Sweets and desserts Sherbet, gelatin, and fruit ice.  Small amounts of dark chocolate. Limit all sweets and desserts. Seasonings and condiments All seasonings and condiments. The items listed above may not be a complete list of foods and drinks you can eat. Contact a dietitian for more options. What foods should I avoid? Fruits Canned fruit in heavy syrup. Fruit in cream or butter sauce. Fried fruit. Limit coconut. Vegetables Vegetables cooked in cheese, cream, or butter sauce. Fried vegetables. Grains Breads that are made with saturated or trans fats, oils, or whole milk. Croissants. Sweet rolls. Donuts. High-fat crackers, such as cheese crackers. Meats and other proteins Fatty meats, such as hot dogs, ribs, sausage, bacon, rib-eye roast or steak. High-fat deli meats, such as salami and bologna. Caviar. Domestic duck and goose. Organ meats, such as liver. Dairy Cream, sour cream, cream cheese, and creamed cottage cheese. Whole-milk cheeses. Whole or 2% milk that is liquid, evaporated, or condensed. Whole buttermilk. Cream sauce or high-fat cheese sauce. Yogurt that is made from whole milk. Fats and oils Meat fat, or shortening. Cocoa butter, hydrogenated oils, palm oil, coconut oil, palm kernel oil. Solid fats and shortenings, including bacon fat, salt pork, lard, and butter. Nondairy cream substitutes. Salad dressings with cheese or sour cream. Beverages Regular sodas and juice drinks with added sugar. Sweets and desserts Frosting. Pudding.  Cookies. Cakes. Pies. Milk chocolate or white chocolate. Buttered syrups. Full-fat ice cream or ice cream drinks. The items listed above may not be a complete list of foods and drinks to avoid. Contact a dietitian for more information. Summary Heart-healthy meal planning includes eating less unhealthy fats, eating more healthy fats, and making other changes in your diet. Eat a balanced diet. This includes fruits and vegetables, low-fat or nonfat dairy, lean protein, nuts and legumes, whole grains, and  heart-healthy oils and fats. This information is not intended to replace advice given to you by your health care provider. Make sure you discuss any questions you have with your health care provider. Document Revised: 05/01/2020 Document Reviewed: 05/01/2020 Elsevier Patient Education  2022 [employer].  Low-Sodium Eating Plan Sodium, which is an element that makes up salt, helps you maintain a healthy balance of fluids in your body. Too much sodium can increase your blood pressure and cause fluid and waste to be held in your body. Your health care provider or dietitian may recommend following this plan if you have high blood pressure (hypertension), kidney disease, liver disease, or heart failure. Eating less sodium can help lower your blood pressure, reduce swelling, and protect your heart, liver, and kidneys. What are tips for following this plan? Reading food labels The Nutrition Facts label lists the amount of sodium in one serving of the food. If you eat more than one serving, you must multiply the listed amount of sodium by the number of servings. Choose foods with less than 140 mg of sodium per serving. Avoid foods with 300 mg of sodium or more per serving. Shopping  Look for lower-sodium products, often labeled as "low-sodium" or "no salt added." Always check the sodium content, even if foods are labeled as "unsalted" or "no salt added." Buy fresh foods. Avoid canned foods and pre-made or frozen meals. Avoid canned, cured, or processed meats. Buy breads that have less than 80 mg of sodium per slice. Cooking  Eat more home-cooked food and less restaurant, buffet, and fast food. Avoid adding salt when cooking. Use salt-free seasonings or herbs instead of table salt or sea salt. Check with your health care provider or pharmacist before using salt substitutes. Cook with plant-based oils, such as canola, sunflower, or olive oil. Meal planning When eating at a restaurant, ask that  your food be prepared with less salt or no salt, if possible. Avoid dishes labeled as brined, pickled, cured, smoked, or made with soy sauce, miso, or teriyaki sauce. Avoid foods that contain MSG (monosodium glutamate). MSG is sometimes added to [place] food, bouillon, and some canned foods. Make meals that can be grilled, baked, poached, roasted, or steamed. These are generally made with less sodium. General information Most people on this plan should limit their sodium intake to 1,500-2,000 mg (milligrams) of sodium each day. What foods should I eat? Fruits Fresh, frozen, or canned fruit. Fruit juice. Vegetables Fresh or frozen vegetables. "No salt added" canned vegetables. "No salt added" tomato sauce and paste. Low-sodium or reduced-sodium tomato and vegetable juice. Grains Low-sodium cereals, including oats, puffed wheat and rice, and shredded wheat. Low-sodium crackers. Unsalted rice. Unsalted pasta. Low-sodium bread. Whole-grain breads and whole-grain pasta. Meats and other proteins Fresh or frozen (no salt added) meat, poultry, seafood, and fish. Low-sodium canned tuna and salmon. Unsalted nuts. Dried peas, beans, and lentils without added salt. Unsalted canned beans. Eggs. Unsalted nut butters. Dairy Milk. Soy milk. Cheese that is naturally low in sodium, such as  ricotta cheese, fresh mozzarella, or Swiss cheese. Low-sodium or reduced-sodium cheese. Cream cheese. Yogurt. Seasonings and condiments Fresh and dried herbs and spices. Salt-free seasonings. Low-sodium mustard and ketchup. Sodium-free salad dressing. Sodium-free light mayonnaise. Fresh or refrigerated horseradish. Lemon juice. Vinegar. Other foods Homemade, reduced-sodium, or low-sodium soups. Unsalted popcorn and pretzels. Low-salt or salt-free chips. The items listed above may not be a complete list of foods and beverages you can eat. Contact a dietitian for more information. What foods should I  avoid? Vegetables Sauerkraut, pickled vegetables, and relishes. Olives. [place] fries. Onion rings. Regular canned vegetables (not low-sodium or reduced-sodium). Regular canned tomato sauce and paste (not low-sodium or reduced-sodium). Regular tomato and vegetable juice (not low-sodium or reduced-sodium). Frozen vegetables in sauces. Grains Instant hot cereals. Bread stuffing, pancake, and biscuit mixes. Croutons. Seasoned rice or pasta mixes. Noodle soup cups. Boxed or frozen macaroni and cheese. Regular salted crackers. Self-rising flour. Meats and other proteins Meat or fish that is salted, canned, smoked, spiced, or pickled. Precooked or cured meat, such as sausages or meat loaves. Tomasa BlaseBacon. Ham. Pepperoni. Hot dogs. Corned beef. Chipped beef. Salt pork. Jerky. Pickled herring. Anchovies and sardines. Regular canned tuna. Salted nuts. Dairy Processed cheese and cheese spreads. Hard cheeses. Cheese curds. Blue cheese. Feta cheese. String cheese. Regular cottage cheese. Buttermilk. Canned milk. Fats and oils Salted butter. Regular margarine. Ghee. Bacon fat. Seasonings and condiments Onion salt, garlic salt, seasoned salt, table salt, and sea salt. Canned and packaged gravies. Worcestershire sauce. Tartar sauce. Barbecue sauce. Teriyaki sauce. Soy sauce, including reduced-sodium. Steak sauce. Fish sauce. Oyster sauce. Cocktail sauce. Horseradish that you find on the shelf. Regular ketchup and mustard. Meat flavorings and tenderizers. Bouillon cubes. Hot sauce. Pre-made or packaged marinades. Pre-made or packaged taco seasonings. Relishes. Regular salad dressings. Salsa. Other foods Salted popcorn and pretzels. Corn chips and puffs. Potato and tortilla chips. Canned or dried soups. Pizza. Frozen entrees and pot pies. The items listed above may not be a complete list of foods and beverages you should avoid. Contact a dietitian for more information. Summary Eating less sodium can help lower your blood  pressure, reduce swelling, and protect your heart, liver, and kidneys. Most people on this plan should limit their sodium intake to 1,500-2,000 mg (milligrams) of sodium each day. Canned, boxed, and frozen foods are high in sodium. Restaurant foods, fast foods, and pizza are also very high in sodium. You also get sodium by adding salt to food. Try to cook at home, eat more fresh fruits and vegetables, and eat less fast food and canned, processed, or prepared foods. This information is not intended to replace advice given to you by your health care provider. Make sure you discuss any questions you have with your health care provider. Document Revised: 01/26/2019 Document Reviewed: 11/22/2018 Elsevier Patient Education  2022 [employer]

## 2021-03-09 ENCOUNTER — Encounter (HOSPITAL_COMMUNITY): Payer: Self-pay | Admitting: *Deleted

## 2021-03-09 ENCOUNTER — Other Ambulatory Visit: Payer: Self-pay

## 2021-03-09 ENCOUNTER — Emergency Department (HOSPITAL_COMMUNITY): Payer: Medicare Other

## 2021-03-09 ENCOUNTER — Emergency Department (HOSPITAL_COMMUNITY)
Admission: EM | Admit: 2021-03-09 | Discharge: 2021-03-09 | Disposition: A | Discharge status: Home / Self Care | Payer: Medicare Other | Attending: Emergency Medicine | Admitting: Emergency Medicine

## 2021-03-09 DIAGNOSIS — R109 Unspecified abdominal pain: Secondary | ICD-10-CM | POA: Diagnosis not present

## 2021-03-09 DIAGNOSIS — Z7901 Long term (current) use of anticoagulants: Secondary | ICD-10-CM | POA: Diagnosis not present

## 2021-03-09 DIAGNOSIS — Z7982 Long term (current) use of aspirin: Secondary | ICD-10-CM | POA: Insufficient documentation

## 2021-03-09 LAB — BASIC METABOLIC PANEL
Anion gap: 10 (ref 5–15)
BUN: 28 mg/dL — ABNORMAL HIGH (ref 8–23)
CO2: 23 mmol/L (ref 22–32)
Calcium: 9.5 mg/dL (ref 8.9–10.3)
Chloride: 104 mmol/L (ref 98–111)
Creatinine, Ser: 1.3 mg/dL — ABNORMAL HIGH (ref 0.61–1.24)
GFR, Estimated: 56 mL/min — ABNORMAL LOW (ref >60)
Glucose, Bld: 131 mg/dL — ABNORMAL HIGH (ref 70–99)
Potassium: 4.1 mmol/L (ref 3.5–5.1)
Sodium: 137 mmol/L (ref 135–145)

## 2021-03-09 LAB — CBC
HCT: 48.9 % (ref 39.0–52.0)
Hemoglobin: 16.1 g/dL (ref 13.0–17.0)
MCH: 30.8 pg (ref 26.0–34.0)
MCHC: 32.9 g/dL (ref 30.0–36.0)
MCV: 93.7 fL (ref 80.0–100.0)
Platelets: 213 10*3/uL (ref 150–400)
RBC: 5.22 MIL/uL (ref 4.22–5.81)
RDW: 14.9 % (ref 11.5–15.5)
WBC: 10.1 10*3/uL (ref 4.0–10.5)
nRBC: 0 % (ref 0.0–0.2)

## 2021-03-09 LAB — URINALYSIS, ROUTINE W REFLEX MICROSCOPIC
Bacteria, UA: NONE SEEN
Bilirubin Urine: NEGATIVE
Glucose, UA: NEGATIVE mg/dL
Ketones, ur: NEGATIVE mg/dL
Leukocytes,Ua: NEGATIVE
Nitrite: NEGATIVE
Protein, ur: 30 mg/dL — AB
Specific Gravity, Urine: 1.013 (ref 1.005–1.030)
pH: 5 (ref 5.0–8.0)

## 2021-03-09 LAB — HEPATIC FUNCTION PANEL
ALT: 39 U/L (ref 0–44)
AST: 35 U/L (ref 15–41)
Albumin: 4.2 g/dL (ref 3.5–5.0)
Alkaline Phosphatase: 80 U/L (ref 38–126)
Bilirubin, Direct: 0.1 mg/dL (ref 0.0–0.2)
Indirect Bilirubin: 0.6 mg/dL (ref 0.3–0.9)
Total Bilirubin: 0.7 mg/dL (ref 0.3–1.2)
Total Protein: 8.5 g/dL — ABNORMAL HIGH (ref 6.5–8.1)

## 2021-03-09 MED ORDER — HYDROMORPHONE HCL 1 MG/ML IJ SOLN
0.5000 mg | Freq: Once | INTRAMUSCULAR | Status: AC
  Administered 2021-03-09: 0.5 mg via INTRAVENOUS
  Filled 2021-03-09: qty 1

## 2021-03-09 MED ORDER — HYDROCODONE-ACETAMINOPHEN 5-325 MG PO TABS: 1.0000 | ORAL_TABLET | Freq: Four times a day (QID) | ORAL | 0 refills | Status: DC | PRN

## 2021-03-09 MED ORDER — SODIUM CHLORIDE 0.9 % IV BOLUS
500.0000 mL | Freq: Once | INTRAVENOUS | Status: AC
  Administered 2021-03-09: 500 mL via INTRAVENOUS

## 2021-03-09 MED ORDER — ONDANSETRON HCL 4 MG/2ML IJ SOLN
4.0000 mg | Freq: Once | INTRAMUSCULAR | Status: AC
  Administered 2021-03-09: 4 mg via INTRAVENOUS
  Filled 2021-03-09: qty 2

## 2021-03-09 NOTE — Discharge Instructions (Signed)
Follow-up with your doctor within a week for recheck ?

## 2021-03-09 NOTE — ED Provider Notes (Addendum)
[facility] EMERGENCY DEPARTMENT Provider Note   CSN: [id number] Arrival date & time: 03/09/21  1059     History  Chief Complaint  Patient presents with   Flank Pain    Gregory Davila is a 81 y.o. male.  Patient complains of left flank pain.  He has a history of kidney stones.  The history is provided by the patient and medical records. No language interpreter was used.  Flank Pain This is a new problem. The current episode started 6 to 12 hours ago. The problem occurs constantly. The problem has not changed since onset.Pertinent negatives include no chest pain, no abdominal pain and no headaches. Nothing aggravates the symptoms.      Home Medications Prior to Admission medications   Medication Sig Start Date End Date Taking? Authorizing Provider  HYDROcodone-acetaminophen (NORCO/VICODIN) 5-325 MG tablet Take 1 tablet by mouth every 6 (six) hours as needed. 03/09/21  Yes Bethann Berkshire, MD  allopurinol (ZYLOPRIM) 100 MG tablet Take 100 mg by mouth daily. 05/08/19   [provider]  aspirin EC 81 MG tablet Take 1 tablet (81 mg total) by mouth daily. Swallow whole. 07/21/20   Tereso Newcomer T, PA-C  atorvastatin (LIPITOR) 40 MG tablet Take 1 tablet (40 mg total) by mouth daily at 6 PM. 03/05/14   Thomasena Edis, Almira Coaster L, PA-C  calcium carbonate (TUMS - DOSED IN MG ELEMENTAL CALCIUM) 500 MG chewable tablet Chew 2 tablets by mouth as needed for indigestion or heartburn.     [provider]  carvedilol (COREG) 12.5 MG tablet TAKE ONE TABLET BY MOUTH TWICE DAILY 01/08/21   Tonny Bollman, MD  chlorthalidone (HYGROTON) 25 MG tablet Take 1 tablet (25 mg total) by mouth daily. . 07/22/20   Camnitz, Andree Coss, MD  Cholecalciferol (VITAMIN D3) 50 MCG (2000 UT) TABS Take 1 tablet by mouth daily at 6 (six) AM.    [provider]  COLCRYS 0.6 MG tablet Take 0.6 mg by mouth as needed (gout).  11/24/10   [provider]  diltiazem (CARDIZEM) 30 MG tablet Take 1 tablet (30 mg  total) by mouth daily as needed. 06/24/20   Graciella Freer, PA-C  ELIQUIS 5 MG TABS tablet Take 5 mg by mouth 2 (two) times daily. 01/08/21   [provider]  fluticasone (FLONASE) 50 MCG/ACT nasal spray Place 1 spray into both nostrils daily.    [provider]  levothyroxine (SYNTHROID) 50 MCG tablet Take 50 mcg by mouth daily before breakfast.  08/30/18 07/21/20  [provider]  montelukast (SINGULAIR) 10 MG tablet Take 10 mg by mouth at bedtime. 05/09/19   [provider]  multivitamin [facility]) per tablet Take 1 tablet by mouth daily.    [provider]  Naphazoline HCl (CLEAR EYES OP) Place 1 drop into both eyes daily as needed (irritation).    [provider]  potassium citrate (UROCIT-K) 10 MEQ (1080 MG) SR tablet Take 10 mEq by mouth in the morning and at bedtime. 02/29/20   [provider]      Allergies    Azithromycin and Oxycodone    Review of Systems   Review of Systems  Constitutional:  Negative for appetite change and fatigue.  HENT:  Negative for congestion, ear discharge and sinus pressure.   Eyes:  Negative for discharge.  Respiratory:  Negative for cough.   Cardiovascular:  Negative for chest pain.  Gastrointestinal:  Negative for abdominal pain and diarrhea.  Genitourinary:  Positive for flank  pain. Negative for frequency and hematuria.  Musculoskeletal:  Negative for back pain.  Skin:  Negative for rash.  Neurological:  Negative for seizures and headaches.  Psychiatric/Behavioral:  Negative for hallucinations.    Physical Exam Updated Vital Signs BP 129/76   Pulse 86   Temp 98 F (36.7 C) (Oral)   Resp 16   SpO2 93%  Physical Exam Vitals and nursing note reviewed.  Constitutional:      Appearance: He is well-developed.  HENT:     Head: Normocephalic.     Nose: Nose normal.  Eyes:     General: No scleral icterus.    Conjunctiva/sclera: Conjunctivae normal.  Neck:     Thyroid: No  thyromegaly.  Cardiovascular:     Rate and Rhythm: Normal rate and regular rhythm.     Heart sounds: No murmur heard.   No friction rub. No gallop.  Pulmonary:     Breath sounds: No stridor. No wheezing or rales.  Chest:     Chest wall: No tenderness.  Abdominal:     General: There is no distension.     Tenderness: There is no abdominal tenderness. There is no rebound.  Musculoskeletal:     Cervical back: Neck supple.     Comments: Tender left flank  Lymphadenopathy:     Cervical: No cervical adenopathy.  Skin:    Findings: No erythema or rash.  Neurological:     Mental Status: He is alert and oriented to person, place, and time.     Motor: No abnormal muscle tone.     Coordination: Coordination normal.  Psychiatric:        Behavior: Behavior normal.    ED Results / Procedures / Treatments   Labs (all labs ordered are listed, but only abnormal results are displayed) Labs Reviewed  URINALYSIS, ROUTINE W REFLEX MICROSCOPIC - Abnormal; Notable for the following components:      Result Value   Hgb urine dipstick SMALL (*)    Protein, ur 30 (*)    All other components within normal limits  BASIC METABOLIC PANEL - Abnormal; Notable for the following components:   Glucose, Bld 131 (*)    BUN 28 (*)    Creatinine, Ser 1.30 (*)    GFR, Estimated 56 (*)    All other components within normal limits  HEPATIC FUNCTION PANEL - Abnormal; Notable for the following components:   Total Protein 8.5 (*)    All other components within normal limits  CBC    EKG None  Radiology CT Renal Stone Study  Result Date: 03/09/2021 CLINICAL DATA:  Left-sided flank pain x1 week. EXAM: CT ABDOMEN AND PELVIS WITHOUT CONTRAST TECHNIQUE: Multidetector CT imaging of the abdomen and pelvis was performed following the standard protocol without IV contrast. RADIATION DOSE REDUCTION: This exam was performed according to the departmental dose-optimization program which includes automated exposure control,  adjustment of the mA and/or kV according to patient size and/or use of iterative reconstruction technique. COMPARISON:  December 13, 2011 FINDINGS: Lower chest: Median sternotomy wires. Bibasilar atelectasis versus scarring. Hepatobiliary: Unremarkable noncontrast appearance of the hepatic parenchyma. Cholelithiasis without findings of acute cholecystitis. No biliary ductal dilation. Pancreas: No pancreatic ductal dilation or evidence of acute inflammation. Spleen: No splenomegaly. Adrenals/Urinary Tract: Hypodense 19 mm left adrenal nodule, unchanged dating back to December 13, 2011 with imaging characteristics of a benign adrenal adenoma. Right adrenal gland appears normal. No hydronephrosis. Bilateral nonobstructive renal stones measuring up to 5 mm. Bilateral renal cysts measuring  up to 9 cm. Lobular renal contour similar to prior. Urinary bladder is unremarkable for degree of distension. Stomach/Bowel: No enteric contrast was administered. Stomach is minimally distended limiting evaluation. No pathologic dilation of small or large bowel. Terminal ileum appears normal. Appendix is not confidently identified however there is no pericecal inflammation. Colonic diverticulosis without evidence of acute diverticulitis. Vascular/Lymphatic: Aortic and branch vessel atherosclerosis without abdominal aortic aneurysm. No pathologically enlarged abdominal or pelvic lymph nodes. Reproductive: Prostatomegaly, similar prior. Other: No significant abdominopelvic free fluid. Musculoskeletal: Multilevel degenerative change of the spine most prominent at L5-S1, similar in comparison to prior. No acute osseous abnormality. IMPRESSION: 1. No acute abnormality of the abdomen or pelvis. 2. Bilateral nonobstructive renal stones measuring up to 5 mm. No obstructive ureteral or bladder calculi identified. 3. Colonic diverticulosis without evidence of acute diverticulitis. 4. Cholelithiasis without findings of acute cholecystitis. 5.  Stable 19 mm left adrenal adenoma. 6.  Aortic Atherosclerosis (ICD10-I70.0). Electronically Signed   By: Maudry Mayhew M.D.   On: 03/09/2021 12:21    Procedures Procedures    Medications Ordered in ED Medications  sodium chloride 0.9 % bolus 500 mL (0 mLs Intravenous Stopped 03/09/21 1250)  HYDROmorphone (DILAUDID) injection 0.5 mg (0.5 mg Intravenous Given 03/09/21 1149)  ondansetron (ZOFRAN) injection 4 mg (4 mg Intravenous Given 03/09/21 1149)    ED Course/ Medical Decision Making/ A&P                           Medical Decision Making Amount and/or Complexity of Data Reviewed Labs: ordered. Radiology: ordered.  Risk Prescription drug management.   This patient presents to the ED for concern of flank pain, this involves an extensive number of treatment options, and is a complaint that carries with it a high risk of complications and morbidity.  The differential diagnosis includes kidney stone, musculoskeletal, ruptured AAA   Co morbidities that complicate the patient evaluation  History of kidney stone   Additional history obtained:  Additional history obtained from patient External records from outside source obtained and reviewed including hospital record   Lab Tests:  I Ordered, and personally interpreted labs.  The pertinent results include: CBC and chemistries which showed mild dehydration and urinalysis that was unremarkable   Imaging Studies ordered:  I ordered imaging studies including CT abdomen I independently visualized and interpreted imaging which showed bilateral kidney stones I agree with the radiologist interpretation   Cardiac Monitoring:  The patient was maintained on a cardiac monitor.  I personally viewed and interpreted the cardiac monitored which showed an underlying rhythm of: Normal sinus rhythm   Medicines ordered and prescription drug management:  I ordered medication including Dilaudid for pain and Zofran for nausea Reevaluation of  the patient after these medicines showed that the patient improved I have reviewed the patients home medicines and have made adjustments as needed   Test Considered:  None   Critical Interventions:  None   Consultations Obtained: No consult  Problem List / ED Course:  Flank pain    Social Determinants of Health:  None      Patient with left flank pain, most likely related to muscle skeletal.  CT scan is negative.  He is given some hydrocodone will follow-up with his PCP        Final Clinical Impression(s) / ED Diagnoses Final diagnoses:  Flank pain    Rx / DC Orders ED Discharge Orders  Ordered    HYDROcodone-acetaminophen (NORCO/VICODIN) 5-325 MG tablet  Every 6 hours PRN        03/09/21 1356              Bethann Berkshire, MD 03/10/21 1814    Bethann Berkshire, MD 03/10/21 1814

## 2021-03-09 NOTE — ED Triage Notes (Signed)
Pain in left flank for over a week, history of kidney stones ?

## 2021-06-10 ENCOUNTER — Other Ambulatory Visit: Payer: Self-pay

## 2021-06-10 MED ORDER — CHLORTHALIDONE 25 MG PO TABS: 25.0000 mg | ORAL_TABLET | Freq: Every day | ORAL | 3 refills | Status: DC

## 2021-08-11 ENCOUNTER — Other Ambulatory Visit: Payer: Self-pay | Admitting: Cardiovascular Disease

## 2021-10-20 ENCOUNTER — Other Ambulatory Visit: Payer: Self-pay

## 2021-10-20 MED ORDER — CHLORTHALIDONE 25 MG PO TABS: 25.0000 mg | ORAL_TABLET | Freq: Every day | ORAL | 0 refills | Status: DC

## 2021-11-08 NOTE — Progress Notes (Unsigned)
Office Visit    Patient Name: Gregory Davila Date of Encounter: 11/09/2021  PCP:  Leeanne Rio, [place]  Cardiologist:  Sherren Mocha, MD  Advanced Practice Provider:  No care team member to display Electrophysiologist:  Will Meredith Leeds, MD    Chief Complaint    Gregory Davila is a 81 y.o. male with a hx of CAD (NSTEMI status post CABG in 02/2014), PAF (on chronic anticoagulation with Coumadin status post PVI ablation in 09/7671 complicated by RFA pseudoaneurysm and AV fistula requiring repair), HFimpEF, ischemic cardiomyopathy, hypertension, hyperlipidemia, chronic kidney disease, gout, history of PE, status post CVA 02/2018, and status post TIA in 07/2018 who presents today for 61-monthfollow-up appointment.  The patient saw Dr. CBurt Knack7/21.  He was admitted 02/2020 with acute left MCA territory stroke.  Of note, his INR was subtherapeutic upon admission.  He was seen in the emergency room on 07/17/2020 with left facial numbness felt to be consistent with TIA.  CT of the head was negative for hemorrhage.  MRI demonstrated no acute abnormality.  It was felt that his symptoms were consistent with TIA.  Of note, his INR upon presentation was 1.8.  He was seen in the office 07/21/2020 by SRichardson Dopp PA.  He had not had any chest discomfort, shortness of breath, syncope, orthopnea, leg edema.  He has not had any recent stroke or TIA symptoms.  His neck CTA from his admission demonstrated 50% left ICA stenosis.  Neurology recommended aspirin/Plavix for 90 days and then aspirin alone.  He has already been taken off of aspirin and Plavix.  He has remained on Coumadin and his INRs have been subtherapeutic.  Last seen by me 02/03/21. He shared that he is off Coumadin and on Eliquis now.  He has not had any cardiac symptoms.  He denies lightheadedness, dizziness, chest pain, and shortness of breath.  He states he sees his kidney doctor in March and his primary care  doctor in May.  Usually his primary care doctor orders a bunch of labs including a lipid panel.  He does not report any falls and has been getting around okay with a cane.  He usually does more walking when the weather is better outside.  He usually tries to walk 1 to 2 miles a day.  He recently had a birthday and turned 81years old.  He states his blood pressure might be a little elevated since he ate some extra salty foods.   Seen in the ER 3/06 for left flank pain.Seen 3/21 for sudden onset of dizziness described as spinning sensation. Associated symptom include tinnitus and vomiting.  Felt better after left ear irrigation.  Today, he states that a week ago today (10/30) [place] and hit his head.  No CT scan was done in the ED.  He is still on Eliquis.  He did not have any symptoms of racing heartbeat prior to his fall but he does note that after the fall his heart started racing.  In the ED he was diagnosed with SVT.  He has a history of atrial fibrillation for which she underwent ablation back in 2020.  He was given a Adenosine  in the ED as well as IV Lopressor.  He converted back to normal sinus rhythm.  He does not feel like he has had any further episodes of SVT.  He has not been particular symptomatic.  He does  state that he got a little lightheaded yesterday at the grocery store.  Blood pressure is stable today and is stable on patient report.No chest pain or SOB.  Reports no shortness of breath nor dyspnea on exertion. Reports no chest pain, pressure, or tightness. No edema, orthopnea, PND.  Past Medical History    Past Medical History:  Diagnosis Date   Anginal pain ([place])    CAD (coronary artery disease)    a, 2011: occluded LAD with left to right collaterals and moderate disease in the right coronary artery and circumflex, EF 50%. b. NSTEMI 07/2011 in setting of AF-RVR, MV abnl, cath w/ LAD 100%, RCA mod-severe dz, small vessel, med rx   Chronic renal insufficiency     followed by primary care physician   Gout    Hypertension    long-standing   Myocardial infarction [facility])    Paroxysmal atrial fibrillation ([place])    a. On Coumadin therapy/amidarone. b. 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   Pulmonary embolism ([place])    Stroke [facility])    Past Surgical History:  Procedure Laterality Date   APPENDECTOMY     ATRIAL FIBRILLATION ABLATION N/A 09/20/2018   Procedure: ATRIAL FIBRILLATION ABLATION;  Surgeon: Constance Haw, MD;  Location: [place] CV LAB;  Service: Cardiovascular;  Laterality: N/A;   CARDIAC CATHETERIZATION  2011 & 2013   2013 L [place], LAD 100%, CFX 50%, RCA 80%, small vessel, med rx   CORONARY ARTERY BYPASS GRAFT N/A 02/14/2014   Procedure: CORONARY ARTERY BYPASS GRAFTING (CABG)X4 LIMA-LAD; SVG-RCA(ENDARDERECTOMY); SVG-OM; SVG-DIAG;  Surgeon: Ivin Poot, MD;  Location: [place];  Service: Open Heart Surgery;  Laterality: N/A;   FALSE ANEURYSM REPAIR Right 09/22/2018   Procedure: REPAIR FEMORAL PSEUDO ANEURYSM AND ARTERIOVENOUS FISTULA;  Surgeon: Elam Dutch, MD;  Location: [place];  Service: Vascular;  Laterality: Right;   LEFT HEART CATHETERIZATION WITH CORONARY ANGIOGRAM N/A 02/08/2014   Procedure: LEFT HEART CATHETERIZATION WITH CORONARY ANGIOGRAM;  Surgeon: Blane Ohara, MD;  Location: [facility] CATH LAB;  Service: Cardiovascular;  Laterality: N/A;   NM MYOVIEW LTD  2013   Scar in the anteroseptal/periapical distribution with moderate peri-infarct ischemia, EF 43%   STERNAL INCISION RECLOSURE N/A 02/28/2014   Procedure: Irrigation and Debridement of Sternum with Sternal Rewiring;  Surgeon: Ivin Poot, MD;  Location: [facility] OR;  Service: Thoracic;  Laterality: N/A;   TEE WITHOUT CARDIOVERSION N/A 02/14/2014   Procedure: TRANSESOPHAGEAL ECHOCARDIOGRAM (TEE);  Surgeon: Ivin Poot, MD;  Location: [place];  Service: Open Heart Surgery;  Laterality: N/A;   TOTAL KNEE ARTHROPLASTY Bilateral      Allergies  Allergies  Allergen Reactions   Azithromycin Nausea And Vomiting    "throws for a loop"   Oxycodone Other (See Comments)    nightmares      EKGs/Labs/Other Studies Reviewed:   The following studies were reviewed today:   Echocardiogram 03/04/2020 EF 55-60, no RWMA, mild LVH, GR 1 DD, normal RVSF, trivial MR, mild AI   Echocardiogram 07/19/2019 EF 50-55   Echocardiogram 07/13/2018 EF 55-60   LHC 2/16 LM patent with irregularity but no significant stenosis. LAD prox 100, mid and dist fill by L-L collats, Dx fills via collats LCx mid 90 with ulcerated plaque RCA mid 80, dist 100 EF 45   Carotid US 2/16 Bilat ICA 1-39   Echo 2/16 Mod LVH, EF 40-45, septal HK, Gr 1 DD, mild AI, mild MAC, mild MR  Myoview 7/13 Ant-septal/periapical infarct with peri-infarct ischemia, EF 43    EKG:  EKG is not ordered today.   Recent Labs: 03/09/2021: ALT 39; BUN 28; Creatinine, Ser 1.30; Hemoglobin 16.1; Platelets 213; Potassium 4.1; Sodium 137  Recent Lipid Panel    Component Value Date/Time   CHOL 133 03/04/2020 0435   CHOL 135 03/24/2017 1247   TRIG 194 (H) 03/04/2020 0435   HDL 31 (L) 03/04/2020 0435   HDL 41 03/24/2017 1247   CHOLHDL 4.3 03/04/2020 0435   VLDL 39 03/04/2020 0435   LDLCALC 63 03/04/2020 0435   LDLCALC 75 03/24/2017 1247    Risk Assessment/Calculations:   CHA2DS2-VASc Score = 7   This indicates a 11.2% annual risk of stroke. The patient's score is based upon: CHF History: 1 HTN History: 1 Diabetes History: 0 Stroke History: 2 Vascular Disease History: 1 Age Score: 2 Gender Score: 0      Home Medications   Current Meds  Medication Sig   allopurinol (ZYLOPRIM) 100 MG tablet Take 100 mg by mouth daily.   atorvastatin (LIPITOR) 40 MG tablet Take 1 tablet (40 mg total) by mouth daily at 6 PM.   calcium carbonate (TUMS - DOSED IN MG ELEMENTAL CALCIUM) 500 MG chewable tablet Chew 2 tablets by mouth as needed for indigestion or  heartburn.    carvedilol (COREG) 12.5 MG tablet TAKE ONE TABLET BY MOUTH TWICE DAILY   chlorthalidone (HYGROTON) 25 MG tablet Take 1 tablet (25 mg total) by mouth daily. .   Cholecalciferol (VITAMIN D3) 50 MCG (2000 UT) TABS Take 1 tablet by mouth daily at 6 (six) AM.   COLCRYS 0.6 MG tablet Take 0.6 mg by mouth as needed (gout).    ELIQUIS 5 MG TABS tablet Take 5 mg by mouth 2 (two) times daily.   fluticasone (FLONASE) 50 MCG/ACT nasal spray Place 1 spray into both nostrils daily.   levothyroxine (SYNTHROID) 50 MCG tablet Take 50 mcg by mouth daily before breakfast.    magnesium oxide (MAG-OX) 400 (240 Mg) MG tablet Take 400 mg by mouth at bedtime.   montelukast (SINGULAIR) 10 MG tablet Take 10 mg by mouth at bedtime.   multivitamin (THERAGRAN) per tablet Take 1 tablet by mouth daily.   Naphazoline HCl (CLEAR EYES OP) Place 1 drop into both eyes daily as needed (irritation).   neomycin-polymyxin b-dexamethasone (MAXITROL) 3.5-10000-0.1 OINT Place into both eyes as needed (irritation).   Potassium Chloride CR (MICRO-K) 8 MEQ CPCR capsule CR Take by mouth 2 (two) times daily.   [DISCONTINUED] aspirin EC 81 MG tablet Take 1 tablet (81 mg total) by mouth daily. Swallow whole.   [DISCONTINUED] diltiazem (CARDIZEM) 30 MG tablet Take 1 tablet (30 mg total) by mouth daily as needed.   [DISCONTINUED] diltiazem (CARDIZEM) 30 MG tablet Take 30 mg by mouth 4 (four) times daily.   [DISCONTINUED] HYDROcodone-acetaminophen (NORCO/VICODIN) 5-325 MG tablet Take 1 tablet by mouth every 6 (six) hours as needed.   [DISCONTINUED] potassium citrate (UROCIT-K) 10 MEQ (1080 MG) SR tablet Take 10 mEq by mouth in the morning and at bedtime.     Review of Systems      All other systems reviewed and are otherwise negative except as noted above.  Physical Exam    VS:  BP 122/70   Pulse 74   Ht _0  (1.778 m)   Wt 212 lb (96.2 kg)   SpO2 95%   BMI 30.42 kg/m  , BMI Body mass index is 30.42 kg/m.  Wt  Readings from Last 3 Encounters:  11/09/21 212 lb (96.2 kg)  02/03/21 211 lb 12.8 oz (96.1 kg)  07/21/20 200 lb 12.8 oz (91.1 kg)     GEN: Well nourished, well developed, in no acute distress. HEENT: normal. Neck: Supple, no JVD, carotid bruits, or masses. Cardiac: RRR, no murmurs, rubs, or gallops. No clubbing, cyanosis, edema.  Radials/PT 2+ and equal bilaterally.  Respiratory:  Respirations regular and unlabored, clear to auscultation bilaterally. GI: Soft, nontender, nondistended. MS: No deformity or atrophy. Skin: Warm and dry, no rash. Neuro:  Strength and sensation are intact. Psych: Normal affect.  Assessment & Plan    Episode of SVT 10/30 -no further episodes to his knowledge -We will update echocardiogram -Labs reviewed and potassium normal, magnesium borderline low (on supplementation) -Repeat labs with his nephrologist at the end of the month -Mail him a ZIO monitor x14 days -After monitor consider referral to EP given his history and recent events  History of stroke -Admitted Feb 2022 with multiple infarct territories in the left MCA distribution -He only remains on Eliquis for anticoagulation -No residual effects from stroke  Coronary artery disease -History of NSTEMI in 2016 followed by CABG -Asymptomatic at this time -Continue GDMT: Atorvastatin and carvedilol  Paroxysmal atrial fibrillation -Status post PVI ablation in 2020. -On chronic Eliquis treatment -No bleeding or falls   Hypertension -BP well controlled in the clinic today -He states that his BP is usually lower however, with his birthday he went off his diet slightly -Encouraged to continue monitoring BP at home  Hyperlipidemia -LDL of 68 on 05/15/2020 -Continue current dose of atorvastatin -lipid panel through PCP  Chronic kidney disease -creatinine 1.59 (7/22) -looks like this is about is baseline -continue to avoid nephrotoxic medications -He has an appointment with his nephrologist  later this month    Disposition: Follow up 6 weeks with Sherren Mocha, MD or APP.  Signed, Elgie Collard, PA-C 11/09/2021, 3:11 PM [place]

## 2021-11-09 ENCOUNTER — Ambulatory Visit: Payer: Medicare Other | Attending: Physician Assistant

## 2021-11-09 ENCOUNTER — Encounter: Payer: Self-pay | Admitting: Physician Assistant

## 2021-11-09 ENCOUNTER — Ambulatory Visit: Payer: Medicare Other | Attending: Physician Assistant | Admitting: Physician Assistant

## 2021-11-09 ENCOUNTER — Other Ambulatory Visit: Payer: Self-pay | Admitting: Physician Assistant

## 2021-11-09 VITALS — BP 122/70 | HR 74 | Ht 70.0 in | Wt 212.0 lb

## 2021-11-09 DIAGNOSIS — I2510 Atherosclerotic heart disease of native coronary artery without angina pectoris: Secondary | ICD-10-CM

## 2021-11-09 DIAGNOSIS — I480 Paroxysmal atrial fibrillation: Secondary | ICD-10-CM

## 2021-11-09 DIAGNOSIS — I10 Essential (primary) hypertension: Secondary | ICD-10-CM

## 2021-11-09 DIAGNOSIS — Z8673 Personal history of transient ischemic attack (TIA), and cerebral infarction without residual deficits: Secondary | ICD-10-CM

## 2021-11-09 DIAGNOSIS — N1831 Chronic kidney disease, stage 3a: Secondary | ICD-10-CM

## 2021-11-09 DIAGNOSIS — R42 Dizziness and giddiness: Secondary | ICD-10-CM

## 2021-11-09 DIAGNOSIS — I471 Supraventricular tachycardia, unspecified: Secondary | ICD-10-CM

## 2021-11-09 DIAGNOSIS — E785 Hyperlipidemia, unspecified: Secondary | ICD-10-CM

## 2021-11-09 MED ORDER — DILTIAZEM HCL 30 MG PO TABS: 30.0000 mg | ORAL_TABLET | Freq: Every day | ORAL | 3 refills | Status: AC | PRN

## 2021-11-09 NOTE — Progress Notes (Unsigned)
Enrolled for [employer] to mail a ZIO XT long term holter monitor to the patients address on file.   Dr. Cooper to read. 

## 2021-11-09 NOTE — Patient Instructions (Signed)
Medication Instructions:  Your physician recommends that you continue on your current medications as directed. Please refer to the Current Medication list given to you today.  *If you need a refill on your cardiac medications before your next appointment, please call your pharmacy*   Lab Work: Magnesium level with your renal provider when you see them at the end of this month, have them fax the results to [place] [phone] If you have labs (blood work) drawn today and your tests are completely normal, you will receive your results only by: MyChart Message (if you have MyChart) OR A paper copy in the mail If you have any lab test that is abnormal or we need to change your treatment, we will call you to review the results.   Testing/Procedures: Your physician has requested that you have an echocardiogram. Echocardiography is a painless test that uses sound waves to create images of your heart. It provides your doctor with information about the size and shape of your heart and how well your heart's chambers and valves are working. This procedure takes approximately one hour. There are no restrictions for this procedure. Please do NOT wear cologne, perfume, aftershave, or lotions (deodorant is allowed). Please arrive 15 minutes prior to your appointment time.    Follow-Up: At [facility], you and your health needs are our priority.  As part of our continuing mission to provide you with exceptional heart care, we have created designated Provider Care Teams.  These Care Teams include your primary Cardiologist (physician) and Advanced Practice Providers (APPs -  Physician Assistants and Nurse Practitioners) who all work together to provide you with the care you need, when you need it.  We recommend signing up for the patient portal called "MyChart".  Sign up information is provided on this After Visit Summary.  MyChart is used to connect with patients for Virtual Visits (Telemedicine).   Patients are able to view lab/test results, encounter notes, upcoming appointments, etc.  Non-urgent messages can be sent to your provider as well.   To learn more about what you can do with MyChart, go to [email or web address].    Your next appointment:   6 week(s)  The format for your next appointment:   In Person  Provider:   Tonny Bollman, MD  or APP  Other Instructions ZIO XT- Long Term Monitor Instructions  Your physician has requested you wear a ZIO patch monitor for 14 days.  This is a single patch monitor. [employer] supplies one patch monitor per enrollment. Additional stickers are not available. Please do not apply patch if you will be having a Nuclear Stress Test,  Echocardiogram, Cardiac CT, MRI, or Chest Xray during the period you would be wearing the  monitor. The patch cannot be worn during these tests. You cannot remove and re-apply the  ZIO XT patch monitor.  Your ZIO patch monitor will be mailed 3 day USPS to your address on file. It may take 3-5 days  to receive your monitor after you have been enrolled.  Once you have received your monitor, please review the enclosed instructions. Your monitor  has already been registered assigning a specific monitor serial # to you.  Billing and Patient Assistance Program Information  We have supplied [employer] with any of your insurance information on file for billing purposes. [employer] offers a sliding scale Patient Assistance Program for patients that do not have  insurance, or whose insurance does not completely cover the cost of the ZIO monitor.  You must  apply for the Patient Assistance Program to qualify for this discounted rate.  To apply, please call [employer] at [phone], select option 4, select option 2, ask to apply for  Patient Assistance Program. Theodore Demark will ask your household income, and how many people  are in your household. They will quote your out-of-pocket cost based on that information.  [employer] will also  be able to set up a 73-month, interest-free payment plan if needed.  Applying the monitor   Shave hair from upper left chest.  Hold abrader disc by orange tab. Rub abrader in 40 strokes over the upper left chest as  indicated in your monitor instructions.  Clean area with 4 enclosed alcohol pads. Let dry.  Apply patch as indicated in monitor instructions. Patch will be placed under collarbone on left  side of chest with arrow pointing upward.  Rub patch adhesive wings for 2 minutes. Remove white label marked "1". Remove the white  label marked "2". Rub patch adhesive wings for 2 additional minutes.  While looking in a mirror, press and release button in center of patch. A small green light will  flash 3-4 times. This will be your only indicator that the monitor has been turned on.  Do not shower for the first 24 hours. You may shower after the first 24 hours.  Press the button if you feel a symptom. You will hear a small click. Record Date, Time and  Symptom in the Patient Logbook.  When you are ready to remove the patch, follow instructions on the last 2 pages of Patient  Logbook. Stick patch monitor onto the last page of Patient Logbook.  Place Patient Logbook in the blue and white box. Use locking tab on box and tape box closed  securely. The blue and white box has prepaid postage on it. Please place it in the mailbox as  soon as possible. Your physician should have your test results approximately 7 days after the  monitor has been mailed back to [facility].  Call [place] at [phone] if you have questions regarding  your ZIO XT patch monitor. Call them immediately if you see an orange light blinking on your  monitor.  If your monitor falls off in less than 4 days, contact our Monitor department at [phone].  If your monitor becomes loose or falls off after 4 days call [employer] at [phone] for  suggestions on securing your monitor   Important  Information About [place]

## 2021-11-16 ENCOUNTER — Ambulatory Visit: Payer: Medicare Other | Attending: Physician Assistant

## 2021-11-16 DIAGNOSIS — R42 Dizziness and giddiness: Secondary | ICD-10-CM

## 2021-11-16 DIAGNOSIS — I480 Paroxysmal atrial fibrillation: Secondary | ICD-10-CM

## 2021-11-16 LAB — ECHOCARDIOGRAM COMPLETE
AR max vel: 1.99 cm2
AV Area VTI: 1.93 cm2
AV Area mean vel: 2.24 cm2
AV Mean grad: 8.5 mmHg
AV Peak grad: 15 mmHg
Ao pk vel: 1.94 m/s
Area-P 1/2: 2.3 cm2
Calc EF: 57.5 %
MV M vel: 4.78 m/s
MV Peak grad: 91.4 mmHg
P 1/2 time: 630 msec
S' Lateral: 2.9 cm
Single Plane A2C EF: 52.3 %
Single Plane A4C EF: 61.2 %

## 2021-12-21 ENCOUNTER — Ambulatory Visit: Payer: Medicare Other | Admitting: Physician Assistant

## 2021-12-22 ENCOUNTER — Ambulatory Visit: Payer: Medicare Other | Admitting: Physician Assistant

## 2021-12-23 ENCOUNTER — Other Ambulatory Visit: Payer: Self-pay

## 2021-12-23 MED ORDER — CHLORTHALIDONE 25 MG PO TABS: 25.0000 mg | ORAL_TABLET | Freq: Every day | ORAL | 0 refills | Status: DC

## 2022-01-01 ENCOUNTER — Other Ambulatory Visit: Payer: Self-pay

## 2022-01-01 MED ORDER — CHLORTHALIDONE 25 MG PO TABS: 25.0000 mg | ORAL_TABLET | Freq: Every day | ORAL | 0 refills | Status: DC

## 2022-01-03 NOTE — Progress Notes (Unsigned)
Office Visit    Patient Name: Gregory Davila Date of Encounter: 01/03/2022  PCP:  Leeanne Rio, [place]  Cardiologist:  Sherren Mocha, MD  Advanced Practice Provider:  No care team member to display Electrophysiologist:  Will Meredith Leeds, MD    Chief Complaint    Gregory Davila is a 81 y.o. 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  The patient saw Dr. CBurt Knack7/21.  He was admitted 02/2020 with acute left MCA territory stroke.  Of note, his INR was subtherapeutic upon admission.  He was seen in the emergency room on 07/17/2020 with left facial numbness felt to be consistent with TIA.  CT of the head was negative for hemorrhage.  MRI demonstrated no acute abnormality.  It was felt that his symptoms were consistent with TIA.  Of note, his INR upon presentation was 1.8.  He was seen in the office 07/21/2020 by SRichardson Dopp PA.  He had not had any chest discomfort, shortness of breath, syncope, orthopnea, leg edema.  He has not had any recent stroke or TIA symptoms.  His neck CTA from his admission demonstrated 50% left ICA stenosis.  Neurology recommended aspirin/Plavix for 90 days and then aspirin alone.  He has already been taken off of aspirin and Plavix.  He has remained on Coumadin and his INRs have been subtherapeutic.  Last seen by me 02/03/21. He shared that he is off Coumadin and on Eliquis now.  He has not had any cardiac symptoms.  He denies lightheadedness, dizziness, chest pain, and shortness of breath.  He states he sees his kidney doctor in March and his primary  care doctor in May.  Usually his primary care doctor orders a bunch of labs including a lipid panel.  He does not report any falls and has been getting around okay with a cane.  He usually does more walking when the weather is better outside.  He usually tries to walk 1 to 2 miles a day.  He recently had a birthday and turned 81years old.  He states his blood pressure might be a little elevated since he ate some extra salty foods.   Seen in the ER 3/06 for left flank pain.Seen 3/21 for sudden onset of dizziness described as spinning sensation. Associated symptom include tinnitus and vomiting.  Felt better after left ear irrigation.  He was last seen by me 11/09/2021 and he stated that a week ago today (10/30) [place] the [place] and hit his head.  No CT scan was done in the ED.  He is still on Eliquis.  He did not have any symptoms of racing heartbeat prior to his fall but he does note that after the fall his heart started racing.  In the ED he was diagnosed with SVT.  He has a history of atrial fibrillation for which she underwent ablation back in 2020.  He was given a Adenosine  in the ED as well as IV Lopressor.  He converted back to normal sinus rhythm.  He does not feel like he has had any further episodes of SVT.  He has  not been particular symptomatic.  He does state that he got a little lightheaded yesterday at the grocery store.  Blood pressure is stable today and is stable on patient report.No chest pain or SOB.  14-day ZIO was ordered which showed minimal heart rate of 54 bpm, maximal heart rate of 187 beats minute with an average heart of 76 bpm.  2 runs of ventricular tachycardia and 202 runs of supraventricular tachycardia the longest lasting 12 seconds.  Occasional ectopic beats.  No atrial fibrillation or atrial flutter.  No significant structural abnormalities on echocardiogram from 11/2021.  Today, he***  Past Medical History    Past Medical History:  Diagnosis Date   Anginal  pain ([employer])    CAD (coronary artery disease)    a, 2011: occluded LAD with left to right collaterals and moderate disease in the right coronary artery and circumflex, EF 50%. b. NSTEMI 07/2011 in setting of AF-RVR, MV abnl, cath w/ LAD 100%, RCA mod-severe dz, small vessel, med rx   Chronic renal insufficiency    followed by primary care physician   Gout    Hypertension    long-standing   Myocardial infarction [facility])    Paroxysmal atrial fibrillation ([place])    a. On Coumadin therapy/amidarone. b. 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   Pulmonary embolism ([place])    Stroke [facility])    Past Surgical History:  Procedure Laterality Date   APPENDECTOMY     ATRIAL FIBRILLATION ABLATION N/A 09/20/2018   Procedure: ATRIAL FIBRILLATION ABLATION;  Surgeon: Constance Haw, MD;  Location: [place] CV LAB;  Service: Cardiovascular;  Laterality: N/A;   CARDIAC CATHETERIZATION  2011 & 2013   2013 L [place], LAD 100%, CFX 50%, RCA 80%, small vessel, med rx   CORONARY ARTERY BYPASS GRAFT N/A 02/14/2014   Procedure: CORONARY ARTERY BYPASS GRAFTING (CABG)X4 LIMA-LAD; SVG-RCA(ENDARDERECTOMY); SVG-OM; SVG-DIAG;  Surgeon: Ivin Poot, MD;  Location: [place];  Service: Open Heart Surgery;  Laterality: N/A;   FALSE ANEURYSM REPAIR Right 09/22/2018   Procedure: REPAIR FEMORAL PSEUDO ANEURYSM AND ARTERIOVENOUS FISTULA;  Surgeon: Elam Dutch, MD;  Location: [place];  Service: Vascular;  Laterality: Right;   LEFT HEART CATHETERIZATION WITH CORONARY ANGIOGRAM N/A 02/08/2014   Procedure: LEFT HEART CATHETERIZATION WITH CORONARY ANGIOGRAM;  Surgeon: Blane Ohara, MD;  Location: [facility] CATH LAB;  Service: Cardiovascular;  Laterality: N/A;   NM MYOVIEW LTD  2013   Scar in the anteroseptal/periapical distribution with moderate peri-infarct ischemia, EF 43%   STERNAL INCISION RECLOSURE N/A 02/28/2014   Procedure: Irrigation and Debridement of Sternum with Sternal Rewiring;  Surgeon: Ivin Poot, MD;  Location: [facility] OR;  Service: Thoracic;  Laterality: N/A;   TEE WITHOUT CARDIOVERSION N/A 02/14/2014   Procedure: TRANSESOPHAGEAL ECHOCARDIOGRAM (TEE);  Surgeon: Ivin Poot, MD;  Location: [place];  Service: Open Heart Surgery;  Laterality: N/A;   TOTAL KNEE ARTHROPLASTY Bilateral     Allergies  Allergies  Allergen Reactions   Azithromycin Nausea And Vomiting    "throws for a loop"   Oxycodone Other (See Comments)    nightmares      EKGs/Labs/Other Studies Reviewed:   The following studies were reviewed today:   Echocardiogram 03/04/2020 EF 55-60, no RWMA, mild LVH, GR 1 DD, normal RVSF, trivial MR, mild AI   Echocardiogram 07/19/2019 EF 50-55   Echocardiogram 07/13/2018 EF 55-60   LHC 2/16 LM patent with irregularity but no significant stenosis. LAD prox 100,  mid and dist fill by L-L collats, Dx fills via collats LCx mid 90 with ulcerated plaque RCA mid 66, dist 100 EF 45   Carotid US 2/16 Bilat ICA 1-39   Echo 2/16 Mod LVH, EF 40-45, septal HK, Gr 1 DD, mild AI, mild MAC, mild MR   Myoview 7/13 Ant-septal/periapical infarct with peri-infarct ischemia, EF 43    EKG:  EKG is not ordered today.   Recent Labs: 03/09/2021: ALT 39; BUN 28; Creatinine, Ser 1.30; Hemoglobin 16.1; Platelets 213; Potassium 4.1; Sodium 137  Recent Lipid Panel    Component Value Date/Time   CHOL 133 03/04/2020 0435   CHOL 135 03/24/2017 1247   TRIG 194 (H) 03/04/2020 0435   HDL 31 (L) 03/04/2020 0435   HDL 41 03/24/2017 1247   CHOLHDL 4.3 03/04/2020 0435   VLDL 39 03/04/2020 0435   LDLCALC 63 03/04/2020 0435   LDLCALC 75 03/24/2017 1247    Risk Assessment/Calculations:   CHA2DS2-VASc Score = 7   This indicates a 11.2% annual risk of stroke. The patient's score is based upon: CHF History: 1 HTN History: 1 Diabetes History: 0 Stroke History: 2 Vascular Disease History: 1 Age Score: 2 Gender Score: 0    {This patient has a significant risk of stroke if  diagnosed with atrial fibrillation.  Please consider VKA or DOAC agent for anticoagulation if the bleeding risk is acceptable.   You can also use the SmartPhrase .Point Blank for documentation.   :[id number]}  Home Medications   No outpatient medications have been marked as taking for the 01/06/22 encounter (Appointment) with Elgie Collard, PA-C.     Review of Systems      All other systems reviewed and are otherwise negative except as noted above.  Physical Exam    VS:  There were no vitals taken for this visit. , BMI There is no height or weight on file to calculate BMI.  Wt Readings from Last 3 Encounters:  11/09/21 212 lb (96.2 kg)  02/03/21 211 lb 12.8 oz (96.1 kg)  07/21/20 200 lb 12.8 oz (91.1 kg)     GEN: Well nourished, well developed, in no acute distress. HEENT: normal. Neck: Supple, no JVD, carotid bruits, or masses. Cardiac: RRR, no murmurs, rubs, or gallops. No clubbing, cyanosis, edema.  Radials/PT 2+ and equal bilaterally.  Respiratory:  Respirations regular and unlabored, clear to auscultation bilaterally. GI: Soft, nontender, nondistended. MS: No deformity or atrophy. Skin: Warm and dry, no rash. Neuro:  Strength and sensation are intact. Psych: Normal affect.  Assessment & Plan    Episode of SVT 10/30 -no further episodes to his knowledge -We will update echocardiogram -Labs reviewed and potassium normal, magnesium borderline low (on supplementation) -Repeat labs with his nephrologist at the end of the month -frequent SVT episodes on monitor, has npt needed PRN Cardizem -titrate BB if able -After monitor consider referral to EP given his history and recent events  History of stroke -Admitted Feb 2022 with multiple infarct territories in the left MCA distribution -He only remains on Eliquis for anticoagulation -No residual effects from stroke  Coronary artery disease -History of NSTEMI in 2016 followed by CABG -Asymptomatic at this time -Continue  GDMT: Atorvastatin and carvedilol  Paroxysmal atrial fibrillation -Status post PVI ablation in 2020. -On chronic Eliquis treatment -No bleeding or falls   Hypertension -BP well controlled in the clinic today -He states that his BP is usually lower however, with his birthday he went off his diet slightly -Encouraged to  continue monitoring BP at home  Hyperlipidemia -LDL of 68 on 05/15/2020 -Continue current dose of atorvastatin -lipid panel through PCP  Chronic kidney disease -creatinine 1.59 (7/22) -looks like this is about is baseline -continue to avoid nephrotoxic medications -He has an appointment with his nephrologist later this month    Disposition: Follow up 6 weeks with Sherren Mocha, MD or APP.  Signed, Elgie Collard, PA-C 01/03/2022, 4:21 PM [facility]

## 2022-01-06 ENCOUNTER — Ambulatory Visit: Payer: Medicare Other | Attending: Physician Assistant | Admitting: Physician Assistant

## 2022-01-06 ENCOUNTER — Encounter: Payer: Self-pay | Admitting: Physician Assistant

## 2022-01-06 VITALS — BP 116/70 | HR 67 | Ht 70.0 in | Wt 205.2 lb

## 2022-01-06 DIAGNOSIS — I471 Supraventricular tachycardia, unspecified: Secondary | ICD-10-CM | POA: Diagnosis not present

## 2022-01-06 DIAGNOSIS — N1831 Chronic kidney disease, stage 3a: Secondary | ICD-10-CM | POA: Diagnosis not present

## 2022-01-06 DIAGNOSIS — I10 Essential (primary) hypertension: Secondary | ICD-10-CM | POA: Diagnosis not present

## 2022-01-06 DIAGNOSIS — Z8673 Personal history of transient ischemic attack (TIA), and cerebral infarction without residual deficits: Secondary | ICD-10-CM

## 2022-01-06 DIAGNOSIS — E785 Hyperlipidemia, unspecified: Secondary | ICD-10-CM

## 2022-01-06 MED ORDER — CHLORTHALIDONE 25 MG PO TABS: 25.0000 mg | ORAL_TABLET | Freq: Every day | ORAL | 0 refills | Status: DC

## 2022-01-06 NOTE — Patient Instructions (Addendum)
Medication Instructions:  Your physician recommends that you continue on your current medications as directed. Please refer to the Current Medication list given to you today.  *If you need a refill on your cardiac medications before your next appointment, please call your pharmacy*   Lab Work: Have your kidney Dr fax [place] your lab results when you have them drawn next week [phone] If you have labs (blood work) drawn today and your tests are completely normal, you will receive your results only by: [place] (if you have MyChart) OR A paper copy in the mail If you have any lab test that is abnormal or we need to change your treatment, we will call you to review the results.   Follow-Up: At [facility], you and your health needs are our priority.  As part of our continuing mission to provide you with exceptional heart care, we have created designated Provider Care Teams.  These Care Teams include your primary Cardiologist (physician) and Advanced Practice Providers (APPs -  Physician Assistants and Nurse Practitioners) who all work together to provide you with the care you need, when you need it.  We recommend signing up for the patient portal called "MyChart".  Sign up information is provided on this After Visit Summary.  MyChart is used to connect with patients for Virtual Visits (Telemedicine).  Patients are able to view lab/test results, encounter notes, upcoming appointments, etc.  Non-urgent messages can be sent to your provider as well.   To learn more about what you can do with MyChart, go to [email or web address].    Your next appointment:   Keep 01/21/22 appointment with Dr Curt Bears at 8:45 AM  Schedule a 6 month follow up with Dr Burt Knack  Important Information About Sugar

## 2022-01-08 DIAGNOSIS — I5032 Chronic diastolic (congestive) heart failure: Secondary | ICD-10-CM | POA: Diagnosis not present

## 2022-01-08 DIAGNOSIS — R82991 Hypocitraturia: Secondary | ICD-10-CM | POA: Diagnosis not present

## 2022-01-08 DIAGNOSIS — N1832 Chronic kidney disease, stage 3b: Secondary | ICD-10-CM | POA: Diagnosis not present

## 2022-01-08 DIAGNOSIS — M109 Gout, unspecified: Secondary | ICD-10-CM | POA: Diagnosis not present

## 2022-01-08 DIAGNOSIS — I129 Hypertensive chronic kidney disease with stage 1 through stage 4 chronic kidney disease, or unspecified chronic kidney disease: Secondary | ICD-10-CM | POA: Diagnosis not present

## 2022-01-08 DIAGNOSIS — N200 Calculus of kidney: Secondary | ICD-10-CM | POA: Diagnosis not present

## 2022-01-12 DIAGNOSIS — N1832 Chronic kidney disease, stage 3b: Secondary | ICD-10-CM | POA: Diagnosis not present

## 2022-01-12 DIAGNOSIS — M109 Gout, unspecified: Secondary | ICD-10-CM | POA: Diagnosis not present

## 2022-01-12 DIAGNOSIS — I9589 Other hypotension: Secondary | ICD-10-CM | POA: Diagnosis not present

## 2022-01-12 DIAGNOSIS — R82991 Hypocitraturia: Secondary | ICD-10-CM | POA: Diagnosis not present

## 2022-01-12 DIAGNOSIS — I5032 Chronic diastolic (congestive) heart failure: Secondary | ICD-10-CM | POA: Diagnosis not present

## 2022-01-12 DIAGNOSIS — N200 Calculus of kidney: Secondary | ICD-10-CM | POA: Diagnosis not present

## 2022-01-12 DIAGNOSIS — Z8679 Personal history of other diseases of the circulatory system: Secondary | ICD-10-CM | POA: Diagnosis not present

## 2022-01-21 ENCOUNTER — Encounter: Payer: Self-pay | Admitting: Cardiology

## 2022-01-21 ENCOUNTER — Ambulatory Visit: Payer: Medicare Other | Attending: Cardiology | Admitting: Cardiology

## 2022-01-21 VITALS — BP 114/70 | HR 71 | Ht 70.0 in | Wt 216.0 lb

## 2022-01-21 DIAGNOSIS — I2510 Atherosclerotic heart disease of native coronary artery without angina pectoris: Secondary | ICD-10-CM

## 2022-01-21 DIAGNOSIS — I471 Supraventricular tachycardia, unspecified: Secondary | ICD-10-CM

## 2022-01-21 DIAGNOSIS — D6869 Other thrombophilia: Secondary | ICD-10-CM | POA: Diagnosis not present

## 2022-01-21 DIAGNOSIS — I4819 Other persistent atrial fibrillation: Secondary | ICD-10-CM | POA: Diagnosis not present

## 2022-01-21 DIAGNOSIS — I10 Essential (primary) hypertension: Secondary | ICD-10-CM | POA: Diagnosis not present

## 2022-01-21 MED ORDER — CARVEDILOL 25 MG PO TABS: 25.0000 mg | ORAL_TABLET | Freq: Two times a day (BID) | ORAL | 6 refills | Status: DC

## 2022-01-21 MED ORDER — APIXABAN 2.5 MG PO TABS: 2.5000 mg | ORAL_TABLET | Freq: Two times a day (BID) | ORAL | 6 refills | Status: DC

## 2022-01-21 NOTE — Patient Instructions (Addendum)
Medication Instructions:  Your physician has recommended you make the following change in your medication:  INCREASE Carvedilol to 25 mg twice daily DECREASE Eliquis to 2.5 mg twice daily  *If you need a refill on your cardiac medications before your next appointment, please call your pharmacy*   Lab Work: None ordered   Testing/Procedures: None ordered   Follow-Up: At [facility], you and your health needs are our priority.  As part of our continuing mission to provide you with exceptional heart care, we have created designated Provider Care Teams.  These Care Teams include your primary Cardiologist (physician) and Advanced Practice Providers (APPs -  Physician Assistants and Nurse Practitioners) who all work together to provide you with the care you need, when you need it.  We recommend signing up for the patient portal called "MyChart".  Sign up information is provided on this After Visit Summary.  MyChart is used to connect with patients for Virtual Visits (Telemedicine).  Patients are able to view lab/test results, encounter notes, upcoming appointments, etc.  Non-urgent messages can be sent to your provider as well.   To learn more about what you can do with MyChart, go to [email or web address].    Your next appointment:   6 month(s)  The format for your next appointment:   In Person  Provider:   You will see one of the following Advanced Practice Providers on your designated Care Team:   Tommye Standard, Renaee Munda" [place], [place] Mamie Levers, NP    Thank you for choosing [facility]!!   Trinidad Curet, RN [phone]

## 2022-01-21 NOTE — Progress Notes (Signed)
Electrophysiology Office Note   Date:  01/21/2022   ID:  Gregory Davila, DOB 1940/07/23, MRN [id number]  PCP:  Suzan Slick, MD  Cardiologist:  Excell Seltzer Primary Electrophysiologist:  Sheron Tallman Jorja Loa, MD    No chief complaint on file.    History of Present Illness: Gregory Davila is a 82 y.o. male who is being seen today for the evaluation of AF at the request of No ref. provider found. Presenting today for electrophysiology evaluation.  He has a history seen for multivessel coronary artery disease post CABG in 2016 after presenting with non-STEMI.  He had persistent atrial fibrillation, chronic systolic heart failure, hypertension, CKD.  He is status post atrial fibrillation ablation 09/20/2018.  He was admitted February 2022 with acute left MCA stroke.  INR was found to be subtherapeutic.  He has since been switched to Eliquis.  He was seen in the emergency room 11/09/2021 after a fall when he hit his head.  He was noted to be in SVT at the time.  He was given adenosine with termination of SVT.  He wore a cardiac monitor that showed multiple runs of SVT, longest 12 seconds.  Today, denies symptoms of palpitations, chest pain, shortness of breath, orthopnea, PND, lower extremity edema, claudication, dizziness, presyncope, syncope, bleeding, or neurologic sequela. The patient is tolerating medications without difficulties.  Since being seen he has done well.  He continues to have episodic palpitations that he attributes to SVT.  He did feel palpitations at the time of his SVT episode.  His palpitations have become more short-lived.  Despite that, he would prefer to increase his carvedilol instead of taking as needed diltiazem.    Past Medical History:  Diagnosis Date   Anginal pain (HCC)    CAD (coronary artery disease)    a, 2011: occluded LAD with left to right collaterals and moderate disease in the right coronary artery and circumflex, EF 50%. b. NSTEMI 07/2011 in setting of  AF-RVR, MV abnl, cath w/ LAD 100%, RCA mod-severe dz, small vessel, med rx   Chronic renal insufficiency    followed by primary care physician   Gout    Hypertension    long-standing   Myocardial infarction [facility])    Paroxysmal atrial fibrillation (HCC)    a. On Coumadin therapy/amidarone. b. 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   Pulmonary embolism (HCC)    Stroke [facility])    Past Surgical History:  Procedure Laterality Date   APPENDECTOMY     ATRIAL FIBRILLATION ABLATION N/A 09/20/2018   Procedure: ATRIAL FIBRILLATION ABLATION;  Surgeon: Regan Lemming, MD;  Location: [facility] INVASIVE CV LAB;  Service: Cardiovascular;  Laterality: N/A;   CARDIAC CATHETERIZATION  2011 & 2013   2013 L [place], LAD 100%, CFX 50%, RCA 80%, small vessel, med rx   CORONARY ARTERY BYPASS GRAFT N/A 02/14/2014   Procedure: CORONARY ARTERY BYPASS GRAFTING (CABG)X4 LIMA-LAD; SVG-RCA(ENDARDERECTOMY); SVG-OM; SVG-DIAG;  Surgeon: Kerin Perna, MD;  Location: [facility] OR;  Service: Open Heart Surgery;  Laterality: N/A;   FALSE ANEURYSM REPAIR Right 09/22/2018   Procedure: REPAIR FEMORAL PSEUDO ANEURYSM AND ARTERIOVENOUS FISTULA;  Surgeon: Sherren Kerns, MD;  Location: [facility] OR;  Service: Vascular;  Laterality: Right;   LEFT HEART CATHETERIZATION WITH CORONARY ANGIOGRAM N/A 02/08/2014   Procedure: LEFT HEART CATHETERIZATION WITH CORONARY ANGIOGRAM;  Surgeon: Micheline Chapman, MD;  Location: [facility] CATH LAB;  Service: Cardiovascular;  Laterality: N/A;   NM MYOVIEW LTD  2013   Scar in the anteroseptal/periapical distribution with moderate peri-infarct ischemia, EF 43%   STERNAL INCISION RECLOSURE N/A 02/28/2014   Procedure: Irrigation and Debridement of Sternum with Sternal Rewiring;  Surgeon: Kerin Perna, MD;  Location: [facility] OR;  Service: Thoracic;  Laterality: N/A;   TEE WITHOUT CARDIOVERSION N/A 02/14/2014   Procedure: TRANSESOPHAGEAL ECHOCARDIOGRAM (TEE);  Surgeon: Kerin Perna, MD;  Location:  [facility] OR;  Service: Open Heart Surgery;  Laterality: N/A;   TOTAL KNEE ARTHROPLASTY Bilateral      Current Outpatient Medications  Medication Sig Dispense Refill   allopurinol (ZYLOPRIM) 100 MG tablet Take 100 mg by mouth daily.     atorvastatin (LIPITOR) 40 MG tablet Take 1 tablet (40 mg total) by mouth daily at 6 PM. 30 tablet 1   calcium carbonate (TUMS - DOSED IN MG ELEMENTAL CALCIUM) 500 MG chewable tablet Chew 2 tablets by mouth as needed for indigestion or heartburn.      chlorthalidone (HYGROTON) 25 MG tablet Take 1 tablet (25 mg total) by mouth daily. Please keep scheduled appointment for future refills. Thank you. 30 tablet 0   Cholecalciferol (VITAMIN D3) 50 MCG (2000 UT) TABS Take 1 tablet by mouth daily at 6 (six) AM.     COLCRYS 0.6 MG tablet Take 0.6 mg by mouth as needed (gout).      diltiazem (CARDIZEM) 30 MG tablet Take 1 tablet (30 mg total) by mouth daily as needed (for palpitations). 45 tablet 3   ELIQUIS 5 MG TABS tablet Take 5 mg by mouth 2 (two) times daily.     fluticasone (FLONASE) 50 MCG/ACT nasal spray Place 1 spray into both nostrils daily.     magnesium oxide (MAG-OX) 400 (240 Mg) MG tablet Take 400 mg by mouth at bedtime.     montelukast (SINGULAIR) 10 MG tablet Take 10 mg by mouth at bedtime.     multivitamin (THERAGRAN) per tablet Take 1 tablet by mouth daily.     Naphazoline HCl (CLEAR EYES OP) Place 1 drop into both eyes daily as needed (irritation).     neomycin-polymyxin b-dexamethasone (MAXITROL) 3.5-10000-0.1 OINT Place into both eyes as needed (irritation).     Potassium Chloride CR (MICRO-K) 8 MEQ CPCR capsule CR Take by mouth 2 (two) times daily.     levothyroxine (SYNTHROID) 50 MCG tablet Take 50 mcg by mouth daily before breakfast.      No current facility-administered medications for this visit.    Allergies:   Azithromycin and Oxycodone   Social History:  The patient  reports that he quit smoking about 56 years ago. His smoking use included  cigarettes. He quit smokeless tobacco use about 15 years ago.  His smokeless tobacco use included chew. He reports current alcohol use. He reports that he does not use drugs.   Family History:  The patient's family history includes Cancer in his mother; Coronary artery disease in his brother; Heart attack in his mother; Prostate cancer in his father.   ROS:  Please see the history of present illness.   Otherwise, review of systems is positive for none.   All other systems are reviewed and negative.   PHYSICAL EXAM: VS:  BP 114/70   Pulse 71   Ht 5\' 10"  (1.778 m)   Wt 216 lb (98 kg)   SpO2 96%   BMI 30.99 kg/m  , BMI Body mass index is 30.99 kg/m. GEN: Well nourished, well developed, in no acute distress  HEENT: normal  Neck:  no JVD, carotid bruits, or masses Cardiac: RRR; no murmurs, rubs, or gallops,no edema  Respiratory:  clear to auscultation bilaterally, normal work of breathing GI: soft, nontender, nondistended, + BS MS: no deformity or atrophy  Skin: warm and dry Neuro:  Strength and sensation are intact Psych: euthymic mood, full affect  EKG:  EKG is ordered today. Personal review of the ekg ordered shows sinus rhythm, PVC  Recent Labs: 03/09/2021: ALT 39; BUN 28; Creatinine, Ser 1.30; Hemoglobin 16.1; Platelets 213; Potassium 4.1; Sodium 137    Lipid Panel     Component Value Date/Time   CHOL 133 03/04/2020 0435   CHOL 135 03/24/2017 1247   TRIG 194 (H) 03/04/2020 0435   HDL 31 (L) 03/04/2020 0435   HDL 41 03/24/2017 1247   CHOLHDL 4.3 03/04/2020 0435   VLDL 39 03/04/2020 0435   LDLCALC 63 03/04/2020 0435   LDLCALC 75 03/24/2017 1247     Wt Readings from Last 3 Encounters:  01/21/22 216 lb (98 kg)  01/06/22 205 lb 3.2 oz (93.1 kg)  11/09/21 212 lb (96.2 kg)      Other studies Reviewed: Additional studies/ records that were reviewed today include: TTE 11/16/21 Review of the above records today demonstrates:   1. Left ventricular ejection fraction, by  estimation, is 55 to 60%. The  left ventricle has normal function. The left ventricle demonstrates  regional wall motion abnormalities (see scoring diagram/findings for  description). Left ventricular diastolic  parameters are consistent with Grade I diastolic dysfunction (impaired  relaxation).   2. Right ventricular systolic function is normal. The right ventricular  size is normal. There is normal pulmonary artery systolic pressure. The  estimated right ventricular systolic pressure is 99.3 mmHg.   3. Left atrial size was mildly dilated.   4. The mitral valve is degenerative. Mild mitral valve regurgitation.   5. The aortic valve is tricuspid. There is moderate calcification of the  aortic valve. Aortic valve regurgitation is mild. Mild aortic valve  stenosis. Aortic regurgitation PHT measures 630 msec. Aortic valve area,  by VTI measures 1.93 cm. Aortic valve  mean gradient measures 8.5 mmHg.   6. The inferior vena cava is normal in size with greater than 50%  respiratory variability, suggesting right atrial pressure of 3 mmHg.   Cardiac monitor 12/17/2021 personally reviewed The basic rhythm is normal sinus with an average HR of 76 bpm. Short ventricular runs are present. Frequent supraventricular runs are present the longest lasting 12 seconds. Occasional supraventricular ectopics, rare ventricular ectopics. No AFib or flutter.   ASSESSMENT AND PLAN:  1.  Persistent atrial fibrillation: Currently on carvedilol and Eliquis, dose as above.  CHA2DS2-VASc of 6.  Status post ablation 09/20/2018.  No further episodes.  Continue with current management.  2.  Coronary artery disease: Status post CABG.  No current chest pain.  3.  Hypertension: Currently well-controlled  4.  SVT: Presented to [facility] with an episode of SVT.  Wore a cardiac monitor that showed minimal SVT, though he does continue to have palpitations.  Gregory Davila increase his carvedilol to 25 mg daily.  Gregory Davila work to get  the results from [facility] of his ER visit and ECGs.  5.  Secondary hypercoagulable state: Currently on Eliquis for atrial fibrillation as above.  Creatinine has become more elevated.  Gregory Davila reduce dose of Eliquis to 2.5 mg twice daily.  Current medicines are reviewed at length with the patient today.   The patient does not have concerns regarding  his medicines.  The following changes were made today: Reduce Eliquis, increase carvedilol  Labs/ tests ordered today include:  Orders Placed This Encounter  Procedures   EKG 12-Lead    Disposition:   FU 98months  Signed, Gregory Goon Meredith Leeds, MD  01/21/2022 8:58 AM     [address] [place] [address] [phone] (office) [phone] (fax)

## 2022-02-17 ENCOUNTER — Telehealth: Payer: Self-pay | Admitting: Cardiology

## 2022-02-17 NOTE — Telephone Encounter (Signed)
Pt aware I spoke w/ [facility] records not 30 minutes ago requesting the actual EKGs be sent and they agreed. Pt aware will be in touch soon w/ advisement

## 2022-02-17 NOTE — Telephone Encounter (Signed)
States that the records that was received previously is all that they have. Please advise

## 2022-02-24 ENCOUNTER — Telehealth: Payer: Self-pay | Admitting: Cardiovascular Disease

## 2022-02-24 NOTE — Telephone Encounter (Signed)
Unable to leave voicemail.

## 2022-02-24 NOTE — Telephone Encounter (Signed)
Pt returning call

## 2022-02-25 NOTE — Telephone Encounter (Signed)
Informed pt that I spoke with the supervisor at the hospital yesterday and was told there was no actual EKG tracings they could find and send with the requested records. Advised to keep current follow up with [place]. Advised to call if reoccurs. Patient verbalized understanding and agreeable to plan.

## 2022-02-26 NOTE — Telephone Encounter (Signed)
Returned call to patient, he states he was returning call from Trinidad Curet, RN from yesterday.  Per phone note from 02/17/22: 02/25/22  4:04 PM Note Informed pt that I spoke with the supervisor at the hospital yesterday and was told there was no actual EKG tracings they could find and send with the requested records. Advised to keep current follow up with [place]. Advised to call if reoccurs. Patient verbalized understanding and agreeable to plan.      Informed patient I would forward this message to Sherri to follow-up on if there is anything new to add.  Patient verbalized understanding of the above, and expressed appreciation for call.

## 2022-03-08 DIAGNOSIS — H35363 Drusen (degenerative) of macula, bilateral: Secondary | ICD-10-CM | POA: Diagnosis not present

## 2022-03-10 DIAGNOSIS — Z885 Allergy status to narcotic agent status: Secondary | ICD-10-CM | POA: Diagnosis not present

## 2022-03-10 DIAGNOSIS — I4891 Unspecified atrial fibrillation: Secondary | ICD-10-CM | POA: Diagnosis not present

## 2022-03-10 DIAGNOSIS — I471 Supraventricular tachycardia, unspecified: Secondary | ICD-10-CM | POA: Diagnosis not present

## 2022-03-10 DIAGNOSIS — Z79899 Other long term (current) drug therapy: Secondary | ICD-10-CM | POA: Diagnosis not present

## 2022-03-10 DIAGNOSIS — I129 Hypertensive chronic kidney disease with stage 1 through stage 4 chronic kidney disease, or unspecified chronic kidney disease: Secondary | ICD-10-CM | POA: Diagnosis not present

## 2022-03-10 DIAGNOSIS — I252 Old myocardial infarction: Secondary | ICD-10-CM | POA: Diagnosis not present

## 2022-03-10 DIAGNOSIS — Z888 Allergy status to other drugs, medicaments and biological substances status: Secondary | ICD-10-CM | POA: Diagnosis not present

## 2022-03-10 DIAGNOSIS — Z7982 Long term (current) use of aspirin: Secondary | ICD-10-CM | POA: Diagnosis not present

## 2022-03-10 DIAGNOSIS — M109 Gout, unspecified: Secondary | ICD-10-CM | POA: Diagnosis not present

## 2022-03-10 DIAGNOSIS — G4733 Obstructive sleep apnea (adult) (pediatric): Secondary | ICD-10-CM | POA: Diagnosis not present

## 2022-03-10 DIAGNOSIS — R002 Palpitations: Secondary | ICD-10-CM | POA: Diagnosis not present

## 2022-03-10 DIAGNOSIS — I2510 Atherosclerotic heart disease of native coronary artery without angina pectoris: Secondary | ICD-10-CM | POA: Diagnosis not present

## 2022-03-10 DIAGNOSIS — N183 Chronic kidney disease, stage 3 unspecified: Secondary | ICD-10-CM | POA: Diagnosis not present

## 2022-03-10 DIAGNOSIS — Z87891 Personal history of nicotine dependence: Secondary | ICD-10-CM | POA: Diagnosis not present

## 2022-03-10 DIAGNOSIS — Z881 Allergy status to other antibiotic agents status: Secondary | ICD-10-CM | POA: Diagnosis not present

## 2022-03-12 ENCOUNTER — Other Ambulatory Visit: Payer: Self-pay | Admitting: Physician Assistant

## 2022-05-06 DIAGNOSIS — N1832 Chronic kidney disease, stage 3b: Secondary | ICD-10-CM | POA: Diagnosis not present

## 2022-05-06 DIAGNOSIS — R82991 Hypocitraturia: Secondary | ICD-10-CM | POA: Diagnosis not present

## 2022-05-06 DIAGNOSIS — I5032 Chronic diastolic (congestive) heart failure: Secondary | ICD-10-CM | POA: Diagnosis not present

## 2022-05-06 DIAGNOSIS — M109 Gout, unspecified: Secondary | ICD-10-CM | POA: Diagnosis not present

## 2022-05-06 DIAGNOSIS — N200 Calculus of kidney: Secondary | ICD-10-CM | POA: Diagnosis not present

## 2022-05-13 DIAGNOSIS — R82991 Hypocitraturia: Secondary | ICD-10-CM | POA: Diagnosis not present

## 2022-05-13 DIAGNOSIS — N1832 Chronic kidney disease, stage 3b: Secondary | ICD-10-CM | POA: Diagnosis not present

## 2022-05-13 DIAGNOSIS — Z5181 Encounter for therapeutic drug level monitoring: Secondary | ICD-10-CM | POA: Diagnosis not present

## 2022-05-13 DIAGNOSIS — N200 Calculus of kidney: Secondary | ICD-10-CM | POA: Diagnosis not present

## 2022-05-14 ENCOUNTER — Other Ambulatory Visit: Payer: Self-pay | Admitting: Physician Assistant

## 2022-08-10 ENCOUNTER — Other Ambulatory Visit: Payer: Self-pay | Admitting: Cardiology

## 2022-08-12 DIAGNOSIS — H353112 Nonexudative age-related macular degeneration, right eye, intermediate dry stage: Secondary | ICD-10-CM | POA: Diagnosis not present

## 2022-08-12 DIAGNOSIS — H524 Presbyopia: Secondary | ICD-10-CM | POA: Diagnosis not present

## 2022-08-31 NOTE — Progress Notes (Unsigned)
Electrophysiology Office Note:   Date:  09/01/2022  ID:  Gregory Davila, DOB 09/01/1940, MRN [id number]  Primary Cardiologist: Tonny Bollman, MD Electrophysiologist: Regan Lemming, MD      History of Present Illness:   Gregory Davila is a 82 y.o. male with h/o NSTEMI in setting of multi-vessel CAD s/p CABG, chronic sCHF, HTN, persistent atrial fibrillation s/p ablation, L MCA CVA 02/2020 with sub-therapeutic INR, CKD seen today for routine electrophysiology followup.   Last seen in 01/2022 by Dr. Elberta Fortis and was found to have good blood pressure control, recent SVT episode & carvedilol was increased to 25mg  daily. In addition, due to variable renal function, his eliquis dosing was reduced to 2.5mg  BID.   Since last being seen in our clinic the patient reports doing very well. He feels his "fast heart rates have been well controlled with the increase in his coreg". 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 He has not had issues with bleeding on OAC beyond occasional bumps/scrapes.   He denies chest pain, palpitations, dyspnea, PND, orthopnea, nausea, vomiting, dizziness, syncope, edema, weight gain, or early satiety.   Review of systems complete and found to be negative unless listed in HPI.   EP Information / Studies Reviewed:    EKG is ordered today. Personal review as below.  EKG Interpretation Date/Time:  Wednesday September 01 2022 08:31:45 EDT Ventricular Rate:  68 PR Interval:  130 QRS Duration:  94 QT Interval:  414 QTC Calculation: 440 R Axis:   62  Text Interpretation: Sinus rhythm with PAC and with occasional Premature ventricular complexes Confirmed by Canary Brim ([id number]) on 09/01/2022 8:41:18 AM   Arrhythmia / AAD Hx  AF - s/p PVI Ablation 09/2018 Previously on Amiodarone ~2010-2020 Eliquis reduced 01/2022 to 2.5mg  due to variable renal function  Studies ECHO 11/2021 > LVEF 55-60%, G1DD, LA mildly  dilated   Risk Assessment/Calculations:    CHA2DS2-VASc Score = 7   This indicates a 11.2% annual risk of stroke. The patient's score is based upon: CHF History: 1 HTN History: 1 Diabetes History: 0 Stroke History: 2 Vascular Disease History: 1 Age Score: 2 Gender Score: 0             Physical Exam:   VS:  BP 106/72   Pulse 68   Ht 5\' 10"  (1.778 m)   Wt 211 lb 3.2 oz (95.8 kg)   SpO2 97%   BMI 30.30 kg/m    Wt Readings from Last 3 Encounters:  09/01/22 211 lb 3.2 oz (95.8 kg)  09/01/22 211 lb (95.7 kg)  01/21/22 216 lb (98 kg)     GEN: pleasant adult male, well nourished, well developed in no acute distress NECK: No JVD; No carotid bruits CARDIAC: Regular rate and rhythm, no murmurs, rubs, gallops RESPIRATORY:  Clear to auscultation without rales, wheezing or rhonchi  ABDOMEN: Soft, non-tender, non-distended EXTREMITIES:  No edema; No deformity   ASSESSMENT AND PLAN:    Persistent Atrial Fibrillation  CHA2DS2-VASc 6, s/p ablation 09/2018.  -continue carvedilol 25mg  BID  -Eliquis 2.5mg , dose reduced 01/2022 by Dr. Elberta Fortis with variable renal function  -low burden of symptoms, feels well controlled on above   SVT  Palpitations Cardiac monitor 11/2021 showed multiple runs of SVT -coreg increased 01/2022 to 25mg   -continue cardizem  Secondary Hypercoagulable State  Hx Provoked PE -eliquis as above  -assess BMP, CBC to update labs  HTN -well  controlled   CAD s/p CABG  -no anginal symptoms, remains active / independent of all ADL's  CKD III  -per primary  -renal dose medications    Follow up with EP APP in 6 months  Signed, Canary Brim, MSN, APRN, NP-C, AGACNP-BC [facility] - Electrophysiology  09/01/2022, 9:06 AM

## 2022-09-01 ENCOUNTER — Ambulatory Visit: Payer: Medicare Other | Admitting: Pulmonary Disease

## 2022-09-01 ENCOUNTER — Encounter: Payer: Self-pay | Admitting: Pulmonary Disease

## 2022-09-01 ENCOUNTER — Encounter: Payer: Self-pay | Admitting: Cardiovascular Disease

## 2022-09-01 ENCOUNTER — Encounter: Payer: Self-pay | Admitting: Physician Assistant

## 2022-09-01 ENCOUNTER — Ambulatory Visit: Payer: Medicare Other | Attending: Cardiovascular Disease | Admitting: Cardiovascular Disease

## 2022-09-01 VITALS — BP 106/72 | HR 68 | Wt 211.0 lb

## 2022-09-01 VITALS — BP 106/72 | HR 68 | Ht 70.0 in | Wt 211.2 lb

## 2022-09-01 DIAGNOSIS — Z8673 Personal history of transient ischemic attack (TIA), and cerebral infarction without residual deficits: Secondary | ICD-10-CM

## 2022-09-01 DIAGNOSIS — I350 Nonrheumatic aortic (valve) stenosis: Secondary | ICD-10-CM | POA: Diagnosis not present

## 2022-09-01 DIAGNOSIS — I4891 Unspecified atrial fibrillation: Secondary | ICD-10-CM | POA: Diagnosis not present

## 2022-09-01 DIAGNOSIS — I10 Essential (primary) hypertension: Secondary | ICD-10-CM

## 2022-09-01 DIAGNOSIS — I2510 Atherosclerotic heart disease of native coronary artery without angina pectoris: Secondary | ICD-10-CM | POA: Diagnosis not present

## 2022-09-01 DIAGNOSIS — I471 Supraventricular tachycardia, unspecified: Secondary | ICD-10-CM | POA: Diagnosis not present

## 2022-09-01 DIAGNOSIS — E785 Hyperlipidemia, unspecified: Secondary | ICD-10-CM | POA: Diagnosis not present

## 2022-09-01 DIAGNOSIS — I4819 Other persistent atrial fibrillation: Secondary | ICD-10-CM

## 2022-09-01 DIAGNOSIS — N1831 Chronic kidney disease, stage 3a: Secondary | ICD-10-CM

## 2022-09-01 DIAGNOSIS — Z79899 Other long term (current) drug therapy: Secondary | ICD-10-CM

## 2022-09-01 NOTE — Progress Notes (Signed)
Cardiology Office Note:    Date:  09/01/2022   ID:  Gregory Davila, DOB [birth year], MRN [id number]  PCP:  Catalina Lunger, DO   [facility] Providers Cardiologist:  Tonny Bollman, MD Electrophysiologist:  Regan Lemming, MD     Referring MD: Catalina Lunger, DO   Chief Complaint  Patient presents with   Coronary Artery Disease    History of Present Illness:    Gregory Davila is a 82 y.o. male with a hx of:  Coronary artery disease NSTEMI s/p CABG in 02/2014 Paroxysmal atrial fibrillation Apixaban >> Warfarin 2/2 $$ S/p PVI ablation in 09/2018 C/b RFA pseudoaneurysm and AV fistula requiring repair HFimpEF (heart failure with improved ejection fraction)  Ischemic cardiomyopathy  Echo 2/16: 40-45 Echo 3/22: EF 55-60 Hypertension Hyperlipidemia Chronic kidney disease Gout History of pulmonary embolism S/p  CVA 02/2020 S/p TIA in 07/2020 Mild aortic stenosis  The patient is here alone today.  He is doing very well.  He played golf yesterday and shot his age of 67.  He denies any exertional symptoms at present.  Denies chest pain, shortness of breath, orthopnea, PND, or edema.  He has had no recent heart palpitations.  Denies any bleeding problems on apixaban.  Overall happy with how he is doing at present.  Past Medical History:  Diagnosis Date   Anginal pain (HCC)    CAD (coronary artery disease)    a, 2011: occluded LAD with left to right collaterals and moderate disease in the right coronary artery and circumflex, EF 50%. b. NSTEMI 07/2011 in setting of AF-RVR, MV abnl, cath w/ LAD 100%, RCA mod-severe dz, small vessel, med rx   Chronic renal insufficiency    followed by primary care physician   Gout    Hypertension    long-standing   Myocardial infarction [facility])    Paroxysmal atrial fibrillation (HCC)    a. On Coumadin therapy/amidarone. b. 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   Pulmonary embolism (HCC)    Stroke [facility])      Past Surgical History:  Procedure Laterality Date   APPENDECTOMY     ATRIAL FIBRILLATION ABLATION N/A 09/20/2018   Procedure: ATRIAL FIBRILLATION ABLATION;  Surgeon: Regan Lemming, MD;  Location: [facility] INVASIVE CV LAB;  Service: Cardiovascular;  Laterality: N/A;   CARDIAC CATHETERIZATION  2011 & 2013   2013 L [place], LAD 100%, CFX 50%, RCA 80%, small vessel, med rx   CORONARY ARTERY BYPASS GRAFT N/A 02/14/2014   Procedure: CORONARY ARTERY BYPASS GRAFTING (CABG)X4 LIMA-LAD; SVG-RCA(ENDARDERECTOMY); SVG-OM; SVG-DIAG;  Surgeon: Kerin Perna, MD;  Location: [facility] OR;  Service: Open Heart Surgery;  Laterality: N/A;   FALSE ANEURYSM REPAIR Right 09/22/2018   Procedure: REPAIR FEMORAL PSEUDO ANEURYSM AND ARTERIOVENOUS FISTULA;  Surgeon: Sherren Kerns, MD;  Location: [facility] OR;  Service: Vascular;  Laterality: Right;   LEFT HEART CATHETERIZATION WITH CORONARY ANGIOGRAM N/A 02/08/2014   Procedure: LEFT HEART CATHETERIZATION WITH CORONARY ANGIOGRAM;  Surgeon: Micheline Chapman, MD;  Location: [facility] CATH LAB;  Service: Cardiovascular;  Laterality: N/A;   NM MYOVIEW LTD  2013   Scar in the anteroseptal/periapical distribution with moderate peri-infarct ischemia, EF 43%   STERNAL INCISION RECLOSURE N/A 02/28/2014   Procedure: Irrigation and Debridement of Sternum with Sternal Rewiring;  Surgeon: Kerin Perna, MD;  Location: [facility] OR;  Service: Thoracic;  Laterality: N/A;   TEE WITHOUT CARDIOVERSION N/A 02/14/2014   Procedure: TRANSESOPHAGEAL ECHOCARDIOGRAM (TEE);  Surgeon: Theron Arista  Donata Clay, MD;  Location: [facility] OR;  Service: Open Heart Surgery;  Laterality: N/A;   TOTAL KNEE ARTHROPLASTY Bilateral     Current Medications: Current Meds  Medication Sig   allopurinol (ZYLOPRIM) 100 MG tablet Take 100 mg by mouth daily.   apixaban (ELIQUIS) 2.5 MG TABS tablet Take 1 tablet (2.5 mg total) by mouth 2 (two) times daily.   atorvastatin (LIPITOR) 40 MG tablet Take 1 tablet (40 mg total) by mouth daily at 6 PM.    calcium carbonate (TUMS - DOSED IN MG ELEMENTAL CALCIUM) 500 MG chewable tablet Chew 2 tablets by mouth as needed for indigestion or heartburn.    carvedilol (COREG) 25 MG tablet TAKE ONE TABLET BY MOUTH TWICE DAILY   chlorthalidone (HYGROTON) 25 MG tablet TAKE ONE TABLET BY MOUTH DAILY   Cholecalciferol (VITAMIN D3) 50 MCG (2000 UT) TABS Take 1 tablet by mouth daily at 6 (six) AM.   COLCRYS 0.6 MG tablet Take 0.6 mg by mouth as needed (gout).    diltiazem (CARDIZEM) 30 MG tablet Take 1 tablet (30 mg total) by mouth daily as needed (for palpitations).   fluticasone (FLONASE) 50 MCG/ACT nasal spray Place 1 spray into both nostrils daily.   levothyroxine (SYNTHROID) 50 MCG tablet Take 50 mcg by mouth daily before breakfast.    magnesium oxide (MAG-OX) 400 (240 Mg) MG tablet Take 400 mg by mouth at bedtime.   montelukast (SINGULAIR) 10 MG tablet Take 10 mg by mouth at bedtime.   multivitamin (THERAGRAN) per tablet Take 1 tablet by mouth daily.   Naphazoline HCl (CLEAR EYES OP) Place 1 drop into both eyes daily as needed (irritation).   neomycin-polymyxin b-dexamethasone (MAXITROL) 3.5-10000-0.1 OINT Place into both eyes as needed (irritation).   Potassium Chloride CR (MICRO-K) 8 MEQ CPCR capsule CR Take by mouth 2 (two) times daily.     Allergies:   Azithromycin and Oxycodone   Social History   Socioeconomic History   Marital status: Married    Spouse name: Not on file   Number of children: Not on file   Years of education: Not on file   Highest education level: Not on file  Occupational History   Occupation: Retired  Tobacco Use   Smoking status: Former    Current packs/day: 0.00    Types: Cigarettes    Quit date: 01/04/1966    Years since quitting: 56.6   Smokeless tobacco: Former    Types: Chew    Quit date: 01/05/2007   Tobacco comments:    quit chewing tobacco about 4 yrs ago  Vaping Use   Vaping status: Never Used  Substance and Sexual Activity   Alcohol use: Yes    Comment:  "seldomly drink beer" 1 every 2-3 weeks   Drug use: No   Sexual activity: Yes  Other Topics Concern   Not on file  Social History Narrative   Not on file   Social Determinants of Health   Financial Resource Strain: Not on file  Food Insecurity: Not on file  Transportation Needs: No Transportation Needs (03/16/2021)   [facility]   PRAPARE - Transportation    Lack of Transportation (Medical): No    Lack of Transportation (Non-Medical): No  Physical Activity: Not on file  Stress: Not on file  Social Connections: Not on file     Family History: The patient's family history includes Cancer in his mother; Coronary artery disease in his brother; Heart attack in his  mother; Prostate cancer in his father.  ROS:   Please see the history of present illness.    All other systems reviewed and are negative.  EKGs/Labs/Other Studies Reviewed:    The following studies were reviewed today: Echo 11/17/2022: 1. Left ventricular ejection fraction, by estimation, is 55 to 60%. The  left ventricle has normal function. The left ventricle demonstrates  regional wall motion abnormalities (see scoring diagram/findings for  description). Left ventricular diastolic  parameters are consistent with Grade I diastolic dysfunction (impaired  relaxation).   2. Right ventricular systolic function is normal. The right ventricular  size is normal. There is normal pulmonary artery systolic pressure. The  estimated right ventricular systolic pressure is 21.8 mmHg.   3. Left atrial size was mildly dilated.   4. The mitral valve is degenerative. Mild mitral valve regurgitation.   5. The aortic valve is tricuspid. There is moderate calcification of the  aortic valve. Aortic valve regurgitation is mild. Mild aortic valve  stenosis. Aortic regurgitation PHT measures 630 msec. Aortic valve area,  by VTI measures 1.93 cm. Aortic valve  mean gradient measures 8.5 mmHg.   6. The  inferior vena cava is normal in size with greater than 50%  respiratory variability, suggesting right atrial pressure of 3 mmHg.   Comparison(s): Prior images reviewed side by side. LVEF remains normal  range at 55-60%. Wall motion abnormalities are better seen on the present  study.       Recent Labs: No results found for requested labs within last 365 days.  Recent Lipid Panel    Component Value Date/Time   CHOL 133 03/04/2020 0435   CHOL 135 03/24/2017 1247   TRIG 194 (H) 03/04/2020 0435   HDL 31 (L) 03/04/2020 0435   HDL 41 03/24/2017 1247   CHOLHDL 4.3 03/04/2020 0435   VLDL 39 03/04/2020 0435   LDLCALC 63 03/04/2020 0435   LDLCALC 75 03/24/2017 1247     Risk Assessment/Calculations:    CHA2DS2-VASc Score = 7   This indicates a 11.2% annual risk of stroke. The patient's score is based upon: CHF History: 1 HTN History: 1 Diabetes History: 0 Stroke History: 2 Vascular Disease History: 1 Age Score: 2 Gender Score: 0               Physical Exam:    VS:  BP 106/72 (Patient Position: Sitting)   Pulse 68   Wt 211 lb (95.7 kg)   SpO2 97%   BMI 30.28 kg/m     Wt Readings from Last 3 Encounters:  09/01/22 211 lb 3.2 oz (95.8 kg)  09/01/22 211 lb (95.7 kg)  01/21/22 216 lb (98 kg)     GEN:  Well nourished, well developed in no acute distress HEENT: Normal NECK: No JVD; No carotid bruits LYMPHATICS: No lymphadenopathy CARDIAC: RRR, 2/6 early peaking ejection murmur at the right upper sternal border RESPIRATORY:  Clear to auscultation without rales, wheezing or rhonchi  ABDOMEN: Soft, non-tender, non-distended MUSCULOSKELETAL:  No edema; No deformity  SKIN: Warm and dry NEUROLOGIC:  Alert and oriented x 3 PSYCHIATRIC:  Normal affect   ASSESSMENT:    1. Persistent atrial fibrillation (HCC)   2. Hypertension, unspecified type   3. Hyperlipidemia LDL goal <70   4. Coronary artery disease involving native coronary artery of native heart without angina  pectoris   5. Mild aortic stenosis    PLAN:    In order of problems listed above:  Patient is stable and appears to  be maintaining sinus rhythm.  He is followed by EP and was seen by the EP APP today, felt to be doing well.  He continues on apixaban for anticoagulation.  He continues on carvedilol and diltiazem. Blood pressure is well-controlled on carvedilol, chlorthalidone, diltiazem.  Continue current management. Patient treated with a high intensity statin drug on atorvastatin 40 mg daily.  Will add lipids to his blood work today. Stable without symptoms of angina.  Anticoagulated with apixaban at reduced dose in the setting of age greater than 80 and creatinine greater than 1.5.  Not currently on antiplatelet therapy because of oral anticoagulation.  Treated with carvedilol and a statin drug as above Last echo reviewed with mild aortic stenosis.  His exam is consistent with this.  LVEF is normal at 55 to 60%.  Will follow clinically and I would anticipate a repeat echocardiogram in about 2 years.           Medication Adjustments/Labs and Tests Ordered: Current medicines are reviewed at length with the patient today.  Concerns regarding medicines are outlined above.  Orders Placed This Encounter  Procedures   CBC   Lipid panel   Comprehensive metabolic panel   EKG 12-Lead   No orders of the defined types were placed in this encounter.   Patient Instructions  Medication Instructions:  Your physician recommends that you continue on your current medications as directed. Please refer to the Current Medication list given to you today.  *If you need a refill on your cardiac medications before your next appointment, please call your pharmacy*   Lab Work: CBC, CMET, Lipids today If you have labs (blood work) drawn today and your tests are completely normal, you will receive your results only by: MyChart Message (if you have MyChart) OR A paper copy in the mail If you have any  lab test that is abnormal or we need to change your treatment, we will call you to review the results.   Testing/Procedures: NONE   Follow-Up: At [facility], you and your health needs are our priority.  As part of our continuing mission to provide you with exceptional heart care, we have created designated Provider Care Teams.  These Care Teams include your primary Cardiologist (physician) and Advanced Practice Providers (APPs -  Physician Assistants and Nurse Practitioners) who all work together to provide you with the care you need, when you need it.  We recommend signing up for the patient portal called "MyChart".  Sign up information is provided on this After Visit Summary.  MyChart is used to connect with patients for Virtual Visits (Telemedicine).  Patients are able to view lab/test results, encounter notes, upcoming appointments, etc.  Non-urgent messages can be sent to your provider as well.   To learn more about what you can do with MyChart, go to [email or web address].    Your next appointment:   1 year(s)  Provider:   Tonny Bollman, MD        Signed, Tonny Bollman, MD  09/01/2022 1:26 PM    [facility]

## 2022-09-01 NOTE — Patient Instructions (Signed)
Medication Instructions:  Your physician recommends that you continue on your current medications as directed. Please refer to the Current Medication list given to you today.  *If you need a refill on your cardiac medications before your next appointment, please call your pharmacy*   Lab Work: CBC, CMET, Lipids today If you have labs (blood work) drawn today and your tests are completely normal, you will receive your results only by: [place] (if you have MyChart) OR A paper copy in the mail If you have any lab test that is abnormal or we need to change your treatment, we will call you to review the results.   Testing/Procedures: NONE   Follow-Up: At [facility], you and your health needs are our priority.  As part of our continuing mission to provide you with exceptional heart care, we have created designated Provider Care Teams.  These Care Teams include your primary Cardiologist (physician) and Advanced Practice Providers (APPs -  Physician Assistants and Nurse Practitioners) who all work together to provide you with the care you need, when you need it.  We recommend signing up for the patient portal called "MyChart".  Sign up information is provided on this After Visit Summary.  MyChart is used to connect with patients for Virtual Visits (Telemedicine).  Patients are able to view lab/test results, encounter notes, upcoming appointments, etc.  Non-urgent messages can be sent to your provider as well.   To learn more about what you can do with MyChart, go to [email or web address].    Your next appointment:   1 year(s)  Provider:   Sherren Mocha, MD

## 2022-09-01 NOTE — Patient Instructions (Signed)
Medication Instructions:   Your physician recommends that you continue on your current medications as directed. Please refer to the Current Medication list given to you today.  *If you need a refill on your cardiac medications before your next appointment, please call your pharmacy*   Lab Work:  BMET AND CBC TODAY    If you have labs (blood work) drawn today and your tests are completely normal, you will receive your results only by: MyChart Message (if you have MyChart) OR A paper copy in the mail If you have any lab test that is abnormal or we need to change your treatment, we will call you to review the results.   Testing/Procedures: NONE ORDERED  TODAY     Follow-Up: At [facility], you and your health needs are our priority.  As part of our continuing mission to provide you with exceptional heart care, we have created designated Provider Care Teams.  These Care Teams include your primary Cardiologist (physician) and Advanced Practice Providers (APPs -  Physician Assistants and Nurse Practitioners) who all work together to provide you with the care you need, when you need it.  We recommend signing up for the patient portal called "MyChart".  Sign up information is provided on this After Visit Summary.  MyChart is used to connect with patients for Virtual Visits (Telemedicine).  Patients are able to view lab/test results, encounter notes, upcoming appointments, etc.  Non-urgent messages can be sent to your provider as well.   To learn more about what you can do with MyChart, go to [email or web address].    Your next appointment:   6 month(s)  Provider:   You may see Will Jorja Loa, MD or one of the following Advanced Practice Providers on your designated Care Team:   Francis Dowse, [place]  Other Instructions

## 2022-09-02 LAB — CBC
Hematocrit: 48.4 % (ref 37.5–51.0)
Hemoglobin: 15.8 g/dL (ref 13.0–17.7)
MCH: 30.6 pg (ref 26.6–33.0)
MCHC: 32.6 g/dL (ref 31.5–35.7)
MCV: 94 fL (ref 79–97)
Platelets: 216 10*3/uL (ref 150–450)
RBC: 5.17 x10E6/uL (ref 4.14–5.80)
RDW: 13.5 % (ref 11.6–15.4)
WBC: 7.2 10*3/uL (ref 3.4–10.8)

## 2022-09-02 LAB — COMPREHENSIVE METABOLIC PANEL
ALT: 31 IU/L (ref 0–44)
AST: 36 IU/L (ref 0–40)
Albumin: 4.2 g/dL (ref 3.7–4.7)
Alkaline Phosphatase: 98 IU/L (ref 44–121)
BUN/Creatinine Ratio: 20 (ref 10–24)
BUN: 35 mg/dL — ABNORMAL HIGH (ref 8–27)
Bilirubin Total: 0.5 mg/dL (ref 0.0–1.2)
CO2: 23 mmol/L (ref 20–29)
Calcium: 10.5 mg/dL — ABNORMAL HIGH (ref 8.6–10.2)
Chloride: 101 mmol/L (ref 96–106)
Creatinine, Ser: 1.71 mg/dL — ABNORMAL HIGH (ref 0.76–1.27)
Globulin, Total: 3.4 g/dL (ref 1.5–4.5)
Glucose: 107 mg/dL — ABNORMAL HIGH (ref 70–99)
Potassium: 4.5 mmol/L (ref 3.5–5.2)
Sodium: 141 mmol/L (ref 134–144)
Total Protein: 7.6 g/dL (ref 6.0–8.5)
eGFR: 40 mL/min/{1.73_m2} — ABNORMAL LOW (ref >59)

## 2022-09-02 LAB — LIPID PANEL
Chol/HDL Ratio: 3.1 ratio (ref 0.0–5.0)
Cholesterol, Total: 129 mg/dL (ref 100–199)
HDL: 41 mg/dL (ref >39)
LDL Chol Calc (NIH): 69 mg/dL (ref 0–99)
Triglycerides: 101 mg/dL (ref 0–149)
VLDL Cholesterol Cal: 19 mg/dL (ref 5–40)

## 2022-09-17 ENCOUNTER — Other Ambulatory Visit: Payer: Self-pay | Admitting: Cardiovascular Disease

## 2022-09-17 DIAGNOSIS — I5032 Chronic diastolic (congestive) heart failure: Secondary | ICD-10-CM | POA: Diagnosis not present

## 2022-09-17 DIAGNOSIS — N200 Calculus of kidney: Secondary | ICD-10-CM | POA: Diagnosis not present

## 2022-09-17 DIAGNOSIS — N1832 Chronic kidney disease, stage 3b: Secondary | ICD-10-CM | POA: Diagnosis not present

## 2022-09-17 DIAGNOSIS — I129 Hypertensive chronic kidney disease with stage 1 through stage 4 chronic kidney disease, or unspecified chronic kidney disease: Secondary | ICD-10-CM | POA: Diagnosis not present

## 2022-09-17 DIAGNOSIS — N189 Chronic kidney disease, unspecified: Secondary | ICD-10-CM | POA: Diagnosis not present

## 2022-09-17 DIAGNOSIS — R7303 Prediabetes: Secondary | ICD-10-CM | POA: Diagnosis not present

## 2022-09-22 DIAGNOSIS — N1832 Chronic kidney disease, stage 3b: Secondary | ICD-10-CM | POA: Diagnosis not present

## 2022-09-22 DIAGNOSIS — R82991 Hypocitraturia: Secondary | ICD-10-CM | POA: Diagnosis not present

## 2022-09-22 DIAGNOSIS — N200 Calculus of kidney: Secondary | ICD-10-CM | POA: Diagnosis not present

## 2022-11-08 ENCOUNTER — Telehealth: Payer: Self-pay | Admitting: Cardiovascular Disease

## 2022-11-08 DIAGNOSIS — I4819 Other persistent atrial fibrillation: Secondary | ICD-10-CM

## 2022-11-08 MED ORDER — APIXABAN 2.5 MG PO TABS: 2.5000 mg | ORAL_TABLET | Freq: Two times a day (BID) | ORAL | 1 refills | Status: DC

## 2022-11-08 NOTE — Telephone Encounter (Signed)
Prescription refill request for Eliquis received. Indication: Afib  Last office visit: 09/01/22 Veleta Miners)  Scr: 1.71 (09/01/22)  Age: 82 Weight: 95.7kg  Appropriate dose. Refill sent.

## 2022-11-08 NOTE — Telephone Encounter (Signed)
*  STAT* If patient is at the pharmacy, call can be transferred to refill team.   1. Which medications need to be refilled? (please list name of each medication and dose if known)   apixaban (ELIQUIS) 2.5 MG TABS tablet     4. Which pharmacy/location (including street and city if local pharmacy) is medication to be sent to? [facility] - [place], [place] - [address] Phone: [phone]  Fax: [phone]       5. Do they need a 30 day or 90 day supply? 90

## 2022-11-11 DIAGNOSIS — R030 Elevated blood-pressure reading, without diagnosis of hypertension: Secondary | ICD-10-CM | POA: Diagnosis not present

## 2022-11-11 DIAGNOSIS — J309 Allergic rhinitis, unspecified: Secondary | ICD-10-CM | POA: Diagnosis not present

## 2022-11-11 DIAGNOSIS — H838X2 Other specified diseases of left inner ear: Secondary | ICD-10-CM | POA: Diagnosis not present

## 2022-11-18 DIAGNOSIS — I471 Supraventricular tachycardia, unspecified: Secondary | ICD-10-CM | POA: Diagnosis not present

## 2022-11-18 DIAGNOSIS — Z885 Allergy status to narcotic agent status: Secondary | ICD-10-CM | POA: Diagnosis not present

## 2022-11-18 DIAGNOSIS — Z881 Allergy status to other antibiotic agents status: Secondary | ICD-10-CM | POA: Diagnosis not present

## 2022-11-18 DIAGNOSIS — Z87891 Personal history of nicotine dependence: Secondary | ICD-10-CM | POA: Diagnosis not present

## 2022-11-18 DIAGNOSIS — E7800 Pure hypercholesterolemia, unspecified: Secondary | ICD-10-CM | POA: Diagnosis not present

## 2022-11-18 DIAGNOSIS — I10 Essential (primary) hypertension: Secondary | ICD-10-CM | POA: Diagnosis not present

## 2022-11-18 DIAGNOSIS — R002 Palpitations: Secondary | ICD-10-CM | POA: Diagnosis not present

## 2022-11-23 DIAGNOSIS — Z951 Presence of aortocoronary bypass graft: Secondary | ICD-10-CM | POA: Diagnosis not present

## 2022-11-23 DIAGNOSIS — Z7901 Long term (current) use of anticoagulants: Secondary | ICD-10-CM | POA: Diagnosis not present

## 2022-11-23 DIAGNOSIS — E039 Hypothyroidism, unspecified: Secondary | ICD-10-CM | POA: Diagnosis not present

## 2022-11-23 DIAGNOSIS — M109 Gout, unspecified: Secondary | ICD-10-CM | POA: Diagnosis not present

## 2022-11-23 DIAGNOSIS — I5042 Chronic combined systolic (congestive) and diastolic (congestive) heart failure: Secondary | ICD-10-CM | POA: Diagnosis not present

## 2022-11-23 DIAGNOSIS — J310 Chronic rhinitis: Secondary | ICD-10-CM | POA: Diagnosis not present

## 2022-11-23 DIAGNOSIS — I10 Essential (primary) hypertension: Secondary | ICD-10-CM | POA: Diagnosis not present

## 2022-12-02 DIAGNOSIS — I959 Hypotension, unspecified: Secondary | ICD-10-CM | POA: Diagnosis not present

## 2022-12-02 DIAGNOSIS — Z87891 Personal history of nicotine dependence: Secondary | ICD-10-CM | POA: Diagnosis not present

## 2022-12-02 DIAGNOSIS — E790 Hyperuricemia without signs of inflammatory arthritis and tophaceous disease: Secondary | ICD-10-CM | POA: Diagnosis not present

## 2022-12-02 DIAGNOSIS — I129 Hypertensive chronic kidney disease with stage 1 through stage 4 chronic kidney disease, or unspecified chronic kidney disease: Secondary | ICD-10-CM | POA: Diagnosis not present

## 2022-12-02 DIAGNOSIS — G4733 Obstructive sleep apnea (adult) (pediatric): Secondary | ICD-10-CM | POA: Diagnosis not present

## 2022-12-02 DIAGNOSIS — R918 Other nonspecific abnormal finding of lung field: Secondary | ICD-10-CM | POA: Diagnosis not present

## 2022-12-02 DIAGNOSIS — R000 Tachycardia, unspecified: Secondary | ICD-10-CM | POA: Diagnosis not present

## 2022-12-02 DIAGNOSIS — R079 Chest pain, unspecified: Secondary | ICD-10-CM | POA: Diagnosis not present

## 2022-12-02 DIAGNOSIS — I480 Paroxysmal atrial fibrillation: Secondary | ICD-10-CM | POA: Diagnosis not present

## 2022-12-02 DIAGNOSIS — Z951 Presence of aortocoronary bypass graft: Secondary | ICD-10-CM | POA: Diagnosis not present

## 2022-12-02 DIAGNOSIS — N183 Chronic kidney disease, stage 3 unspecified: Secondary | ICD-10-CM | POA: Diagnosis not present

## 2022-12-02 DIAGNOSIS — E7800 Pure hypercholesterolemia, unspecified: Secondary | ICD-10-CM | POA: Diagnosis not present

## 2022-12-02 DIAGNOSIS — Z79899 Other long term (current) drug therapy: Secondary | ICD-10-CM | POA: Diagnosis not present

## 2022-12-06 ENCOUNTER — Telehealth: Payer: Self-pay | Admitting: Cardiology

## 2022-12-06 NOTE — Telephone Encounter (Signed)
Left message to call back  

## 2022-12-06 NOTE — Telephone Encounter (Signed)
Pt calling in to make sure Dr. Elberta Fortis is aware of his recent ED visits to [facility]. 2 afib episodes, one 11/14 and last one 11/26. He took a Diltiazem PRN for episodes. According to hospital notes they cardioverted pt in ED.  Patient also had hypotension.  Aware forwarding to MD for advisement. Informed pt that he will probably advise to follow up in afib clinic in the next couple weeks-month.  Pt would be agreeable to this plan if provider advises so.

## 2022-12-06 NOTE — Telephone Encounter (Signed)
Patient is requesting to speak directly with Dr. Elberta Fortis' nurse. He declined to discuss further and states he would prefer to speak directly with RN.

## 2022-12-06 NOTE — Telephone Encounter (Signed)
Patient is returning call.  °

## 2022-12-07 NOTE — Telephone Encounter (Signed)
Pt aware Dr. Elberta Fortis recommends he follow up in afib clinic. Aware their office will call him to arrange an appt. Pt agreeable to plan.

## 2022-12-08 NOTE — Telephone Encounter (Signed)
Pt scheduled to see afib clinic 12/6

## 2022-12-10 ENCOUNTER — Ambulatory Visit (HOSPITAL_COMMUNITY)
Admission: RE | Admit: 2022-12-10 | Discharge: 2022-12-10 | Disposition: A | Discharge status: Home / Self Care | Payer: Medicare Other | Source: Ambulatory Visit | Attending: Internal Medicine | Admitting: Internal Medicine

## 2022-12-10 ENCOUNTER — Encounter (HOSPITAL_COMMUNITY): Payer: Self-pay | Admitting: Internal Medicine

## 2022-12-10 VITALS — BP 134/88 | HR 68 | Ht 70.0 in | Wt 212.2 lb

## 2022-12-10 DIAGNOSIS — Z79899 Other long term (current) drug therapy: Secondary | ICD-10-CM | POA: Diagnosis not present

## 2022-12-10 DIAGNOSIS — Z7901 Long term (current) use of anticoagulants: Secondary | ICD-10-CM | POA: Diagnosis not present

## 2022-12-10 DIAGNOSIS — I4819 Other persistent atrial fibrillation: Secondary | ICD-10-CM

## 2022-12-10 MED ORDER — AMIODARONE HCL 200 MG PO TABS: ORAL_TABLET | ORAL | 3 refills | Status: DC

## 2022-12-10 NOTE — Progress Notes (Addendum)
Primary Care Physician: Catalina Lunger, DO Referring Physician:Dr. Dj Mccranie is a 82 y.o. male with a h/o afib ablation 09/2018 by Dr. Elberta Fortis with post op complication of pseudoaneurysm. This improving slowly. He is walking more with less discomfort.No awareness of afib. Will reduce amiodarone to 200 mg daily.  Continues on anticoagultion.   On evaluation today, he is currently in NSR. Patient contacted office on 12/2 noting he went to ED for 2 Afib episodes on 11/14 and 11/26; he required cardioversion in the ED. He is currently on Eliquis 2.5 mg BID. Takes Coreg 25 mg BID. Has PRN diltiazem. He is interested in better control to avoid the ER. Noted that his TSH was very abnormal initially; patient was without medication for 2 months due to PCP switching and not refilling his medication.   Today, he denies symptoms of palpitations, chest pain, shortness of breath, orthopnea, PND, lower extremity edema, dizziness, presyncope, syncope, or neurologic sequela. The patient is tolerating medications without difficulties and is otherwise without complaint today.   Past Medical History:  Diagnosis Date   Anginal pain (HCC)    CAD (coronary artery disease)    a, 2011: occluded LAD with left to right collaterals and moderate disease in the right coronary artery and circumflex, EF 50%. b. NSTEMI 07/2011 in setting of AF-RVR, MV abnl, cath w/ LAD 100%, RCA mod-severe dz, small vessel, med rx   Chronic renal insufficiency    followed by primary care physician   Gout    Hypertension    long-standing   Myocardial infarction [facility])    Paroxysmal atrial fibrillation (HCC)    a. On Coumadin therapy/amidarone. b. 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   Pulmonary embolism (HCC)    Stroke [facility])    Past Surgical History:  Procedure Laterality Date   APPENDECTOMY     ATRIAL FIBRILLATION ABLATION N/A 09/20/2018   Procedure: ATRIAL FIBRILLATION ABLATION;   Surgeon: Regan Lemming, MD;  Location: [facility] INVASIVE CV LAB;  Service: Cardiovascular;  Laterality: N/A;   CARDIAC CATHETERIZATION  2011 & 2013   2013 [place], LAD 100%, CFX 50%, RCA 80%, small vessel, med rx   CORONARY ARTERY BYPASS GRAFT N/A 02/14/2014   Procedure: CORONARY ARTERY BYPASS GRAFTING (CABG)X4 LIMA-LAD; SVG-RCA(ENDARDERECTOMY); SVG-OM; SVG-DIAG;  Surgeon: Kerin Perna, MD;  Location: [facility] OR;  Service: Open Heart Surgery;  Laterality: N/A;   FALSE ANEURYSM REPAIR Right 09/22/2018   Procedure: REPAIR FEMORAL PSEUDO ANEURYSM AND ARTERIOVENOUS FISTULA;  Surgeon: Sherren Kerns, MD;  Location: [facility] OR;  Service: Vascular;  Laterality: Right;   LEFT HEART CATHETERIZATION WITH CORONARY ANGIOGRAM N/A 02/08/2014   Procedure: LEFT HEART CATHETERIZATION WITH CORONARY ANGIOGRAM;  Surgeon: Micheline Chapman, MD;  Location: [facility] CATH LAB;  Service: Cardiovascular;  Laterality: N/A;   NM MYOVIEW LTD  2013   Scar in the anteroseptal/periapical distribution with moderate peri-infarct ischemia, EF 43%   STERNAL INCISION RECLOSURE N/A 02/28/2014   Procedure: Irrigation and Debridement of Sternum with Sternal Rewiring;  Surgeon: Kerin Perna, MD;  Location: [facility] OR;  Service: Thoracic;  Laterality: N/A;   TEE WITHOUT CARDIOVERSION N/A 02/14/2014   Procedure: TRANSESOPHAGEAL ECHOCARDIOGRAM (TEE);  Surgeon: Kerin Perna, MD;  Location: [facility] OR;  Service: Open Heart Surgery;  Laterality: N/A;   TOTAL KNEE ARTHROPLASTY Bilateral     Current Outpatient Medications  Medication Sig Dispense Refill   allopurinol (ZYLOPRIM) 100 MG tablet Take 100 mg  by mouth daily.     apixaban (ELIQUIS) 2.5 MG TABS tablet Take 1 tablet (2.5 mg total) by mouth 2 (two) times daily. 180 tablet 1   atorvastatin (LIPITOR) 40 MG tablet TAKE ONE TABLET BY MOUTH EVERY DAY 90 tablet 2   calcium carbonate (TUMS - DOSED IN MG ELEMENTAL CALCIUM) 500 MG chewable tablet Chew 2 tablets by mouth as needed for indigestion or heartburn.       carvedilol (COREG) 25 MG tablet TAKE ONE TABLET BY MOUTH TWICE DAILY 60 tablet 5   chlorthalidone (HYGROTON) 25 MG tablet TAKE ONE TABLET BY MOUTH DAILY 90 tablet 2   Cholecalciferol (VITAMIN D3) 50 MCG (2000 UT) TABS Take 1 tablet by mouth daily at 6 (six) AM.     COLCRYS 0.6 MG tablet Take 0.6 mg by mouth as needed (gout).      diltiazem (CARDIZEM) 30 MG tablet Take 1 tablet (30 mg total) by mouth daily as needed (for palpitations). 45 tablet 3   fluticasone (FLONASE) 50 MCG/ACT nasal spray Place 1 spray into both nostrils daily.     levothyroxine (SYNTHROID) 50 MCG tablet Take 50 mcg by mouth daily before breakfast.      magnesium oxide (MAG-OX) 400 (240 Mg) MG tablet Take 400 mg by mouth at bedtime.     multivitamin (THERAGRAN) per tablet Take 1 tablet by mouth daily.     Naphazoline HCl (CLEAR EYES OP) Place 1 drop into both eyes daily as needed (irritation).     neomycin-polymyxin b-dexamethasone (MAXITROL) 3.5-10000-0.1 OINT Place into both eyes as needed (irritation).     Potassium Chloride CR (MICRO-K) 8 MEQ CPCR capsule CR Take by mouth 2 (two) times daily.     No current facility-administered medications for this encounter.    Allergies  Allergen Reactions   Azithromycin Nausea And Vomiting    "throws for a loop"   Oxycodone Other (See Comments)    nightmares   ROS- All systems are reviewed and negative except as per the HPI above  Physical Exam: Vitals:   12/10/22 0843  BP: 134/88  Pulse: 68  Weight: 96.3 kg  Height: 5\' 10"  (1.778 m)   Wt Readings from Last 3 Encounters:  12/10/22 96.3 kg  09/01/22 95.8 kg  09/01/22 95.7 kg    Labs: Lab Results  Component Value Date   NA 141 09/01/2022   K 4.5 09/01/2022   CL 101 09/01/2022   CO2 23 09/01/2022   GLUCOSE 107 (H) 09/01/2022   BUN 35 (H) 09/01/2022   CREATININE 1.71 (H) 09/01/2022   CALCIUM 10.5 (H) 09/01/2022   PHOS 3.0 11/02/2009   MG 1.9 01/28/2016   Lab Results  Component Value Date   INR 1.8  (H) 07/17/2020   Lab Results  Component Value Date   CHOL 129 09/01/2022   HDL 41 09/01/2022   LDLCALC 69 09/01/2022   TRIG 101 09/01/2022   GEN- The patient is well appearing, alert and oriented x 3 today.   Neck - no JVD or carotid bruit noted Lungs- Clear to ausculation bilaterally, normal work of breathing Heart- Regular rate and rhythm, no murmurs, rubs or gallops, PMI not laterally displaced Extremities- no clubbing, cyanosis, or edema Skin - no rash or ecchymosis noted   EKG- Vent. rate 68 BPM PR interval 150 ms QRS duration 94 ms QT/QTcB 416/442 ms P-R-T axes -7 50 43 Normal sinus rhythm Anteroseptal infarct , age undetermined Abnormal ECG When compared with ECG of 01-Sep-2022 08:31, PREVIOUS ECG  IS PRESENT  ECHO 11/16/21: 1. Left ventricular ejection fraction, by estimation, is 55 to 60%. The  left ventricle has normal function. The left ventricle demonstrates  regional wall motion abnormalities (see scoring diagram/findings for  description). Left ventricular diastolic  parameters are consistent with Grade I diastolic dysfunction (impaired  relaxation).   2. Right ventricular systolic function is normal. The right ventricular  size is normal. There is normal pulmonary artery systolic pressure. The  estimated right ventricular systolic pressure is 21.8 mmHg.   3. Left atrial size was mildly dilated.   4. The mitral valve is degenerative. Mild mitral valve regurgitation.   5. The aortic valve is tricuspid. There is moderate calcification of the  aortic valve. Aortic valve regurgitation is mild. Mild aortic valve  stenosis. Aortic regurgitation PHT measures 630 msec. Aortic valve area,  by VTI measures 1.93 cm. Aortic valve  mean gradient measures 8.5 mmHg.   6. The inferior vena cava is normal in size with greater than 50%  respiratory variability, suggesting right atrial pressure of 3 mmHg.   Comparison(s): Prior images reviewed side by side. LVEF remains  normal  range at 55-60%. Wall motion abnormalities are better seen on the present  study.    Assessment and Plan: 1. Persistent Afib Recently seen twice in ED requiring cardioversion in last episode. After discussion of options, patient would like to proceed with rhythm control in the form of amiodarone (on this previously) and speaking with Dr. Elberta Fortis if candidate for repeat ablation. It is noted in his history he had a pseudoaneursym post ablation. I am unsure if this is prohibitive of future procedures or if prior/post imaging would be sufficient. I will have him follow up with Dr. Elberta Fortis to discuss ablation.   Begin amiodarone 200 mg BID x 2 weeks then transition to amiodarone 200 mg once daily.   CrCl 45 mL/min Qtc in NSR 442 ms  2. CHA2DS2VASc score of at least 5  Continue apixaban 5 mg bid  Aware not to interrupt anticoagulation during the 3 month healing period  3. History of Rt pseudoaneurysm Improving Has f/u with vascular tommorrow     F/u with Dr. Elberta Fortis.    Justin Mend, PA-C [facility] [address] [place], [place] [address] [phone]

## 2022-12-10 NOTE — Patient Instructions (Addendum)
Start Amiodarone 200mg  twice a day for 14 days then reduce to once a day    Follow up with Dr Elberta Fortis in 2-3 weeks - scheduling will call

## 2022-12-21 ENCOUNTER — Telehealth: Payer: Self-pay | Admitting: Cardiology

## 2022-12-21 NOTE — Telephone Encounter (Signed)
Patient would also wanted to know if he could receive a text message instead of voicemail

## 2022-12-21 NOTE — Telephone Encounter (Signed)
Patient called to talk with Dr. Elberta Fortis or nurse about getting an ablation

## 2022-12-24 NOTE — Telephone Encounter (Addendum)
Pt aware office will call to arrange OV w/ Dr. Elberta Fortis to discuss ablation.  Understands visit will not be until next year. Aware I will let schedulers know if he does not answer to leave their direct number for him to call back and schedule.

## 2023-01-04 DIAGNOSIS — D631 Anemia in chronic kidney disease: Secondary | ICD-10-CM | POA: Diagnosis not present

## 2023-01-04 DIAGNOSIS — N189 Chronic kidney disease, unspecified: Secondary | ICD-10-CM | POA: Diagnosis not present

## 2023-01-04 DIAGNOSIS — R809 Proteinuria, unspecified: Secondary | ICD-10-CM | POA: Diagnosis not present

## 2023-01-12 DIAGNOSIS — R82991 Hypocitraturia: Secondary | ICD-10-CM | POA: Diagnosis not present

## 2023-01-12 DIAGNOSIS — N200 Calculus of kidney: Secondary | ICD-10-CM | POA: Diagnosis not present

## 2023-01-12 DIAGNOSIS — I129 Hypertensive chronic kidney disease with stage 1 through stage 4 chronic kidney disease, or unspecified chronic kidney disease: Secondary | ICD-10-CM | POA: Diagnosis not present

## 2023-01-12 DIAGNOSIS — N1832 Chronic kidney disease, stage 3b: Secondary | ICD-10-CM | POA: Diagnosis not present

## 2023-01-25 ENCOUNTER — Encounter: Payer: Self-pay | Admitting: Cardiology

## 2023-01-25 ENCOUNTER — Ambulatory Visit: Payer: Medicare Other | Attending: Cardiology | Admitting: Cardiology

## 2023-01-25 VITALS — BP 124/70 | HR 61 | Ht 70.0 in | Wt 218.0 lb

## 2023-01-25 DIAGNOSIS — D6869 Other thrombophilia: Secondary | ICD-10-CM

## 2023-01-25 DIAGNOSIS — I2510 Atherosclerotic heart disease of native coronary artery without angina pectoris: Secondary | ICD-10-CM | POA: Diagnosis not present

## 2023-01-25 DIAGNOSIS — I471 Supraventricular tachycardia, unspecified: Secondary | ICD-10-CM

## 2023-01-25 DIAGNOSIS — I4819 Other persistent atrial fibrillation: Secondary | ICD-10-CM | POA: Diagnosis not present

## 2023-01-25 DIAGNOSIS — I10 Essential (primary) hypertension: Secondary | ICD-10-CM

## 2023-01-25 MED ORDER — APIXABAN 2.5 MG PO TABS: 2.5000 mg | ORAL_TABLET | Freq: Two times a day (BID) | ORAL | 1 refills | Status: DC

## 2023-01-25 NOTE — Progress Notes (Signed)
Electrophysiology Office Note:   Date:  01/25/2023  ID:  COBEE MIAO, DOB Oct 09, 1940, MRN [id number]  Primary Cardiologist: Tonny Bollman, MD Primary Heart Failure: None Electrophysiologist: Kyzer Blowe Jorja Loa, MD      History of Present Illness:   Gregory Davila is a 83 y.o. male with h/o coronary artery disease post CABG, persistent atrial fibrillation, chronic systolic heart failure, hypertension, CKD, CVA seen today for routine electrophysiology followup.   Since last being seen in our clinic the patient reports doing overall well.  He has no chest pain or shortness of breath.  He did present to the emergency room 2 times in November.  The first time he went to the emergency room, notes describe SVT, the second time he went to the emergency room, he was in atrial fibrillation.  He did require cardioversion for his atrial fibrillation.  He was given adenosine for his SVT which converted him to sinus rhythm.  He has not been started on amiodarone.  He did require cardioversion after his first episode.  Currently he is feeling well.  He has had no further episodes of atrial fibrillation.  he denies chest pain, palpitations, dyspnea, PND, orthopnea, nausea, vomiting, dizziness, syncope, edema, weight gain, or early satiety.   Review of systems complete and found to be negative unless listed in HPI.   EP Information / Studies Reviewed:    EKG is ordered today. Personal review as below.  EKG Interpretation Date/Time:  Tuesday January 25 2023 08:38:00 EST Ventricular Rate:  61 PR Interval:  176 QRS Duration:  96 QT Interval:  458 QTC Calculation: 461 R Axis:   64  Text Interpretation: Normal sinus rhythm Low voltage QRS Cannot rule out Anteroseptal infarct (cited on or before 10-Dec-2022) When compared with ECG of 10-Dec-2022 08:46, No significant change since last tracing Confirmed by Jaxden Blyden ([id number]) on 01/25/2023 8:42:54 AM     Risk Assessment/Calculations:    CHA2DS2-VASc  Score = 6   This indicates a 9.7% annual risk of stroke. The patient's score is based upon: CHF History: 0 HTN History: 1 Diabetes History: 0 Stroke History: 2 Vascular Disease History: 1 Age Score: 2 Gender Score: 0             Physical Exam:   VS:  BP 124/70 (BP Location: Left Arm, Patient Position: Sitting, Cuff Size: Large)   Pulse 61   Ht 5\' 10"  (1.778 m)   Wt 218 lb (98.9 kg)   SpO2 96%   BMI 31.28 kg/m    Wt Readings from Last 3 Encounters:  01/25/23 218 lb (98.9 kg)  12/10/22 212 lb 3.2 oz (96.3 kg)  09/01/22 211 lb 3.2 oz (95.8 kg)     GEN: Well nourished, well developed in no acute distress NECK: No JVD; No carotid bruits CARDIAC: Regular rate and rhythm, no murmurs, rubs, gallops RESPIRATORY:  Clear to auscultation without rales, wheezing or rhonchi  ABDOMEN: Soft, non-tender, non-distended EXTREMITIES:  No edema; No deformity   ASSESSMENT AND PLAN:    1.  Persistent atrial fibrillation: Post ablation 09/20/2018.  He is continued to have episodes of atrial fibrillation.  He is now on amiodarone.  Since starting amiodarone, he has had no further arrhythmias.  He is feeling well and is happy with his control.  We did discuss repeat ablation, but for now, he would prefer to continue with his current management.  2.  Secondary hypercoagulable state: Currently on Eliquis for atrial fibrillation  3.  Coronary artery  disease: Post CABG.  No current chest pain.  4.  SVT: Presented to [facility] with episodes of SVT responsive to adenosine.  Currently on carvedilol.  5.  Hypertension: Currently well-controlled  Follow up with [facility] in 6 months  Signed, Mikeila Burgen Jorja Loa, MD

## 2023-01-25 NOTE — Patient Instructions (Signed)
Medication Instructions:  Your physician recommends that you continue on your current medications as directed. Please refer to the Current Medication list given to you today.  *If you need a refill on your cardiac medications before your next appointment, please call your pharmacy*   Lab Work: None ordered.  If you have labs (blood work) drawn today and your tests are completely normal, you will receive your results only by: MyChart Message (if you have MyChart) OR A paper copy in the mail If you have any lab test that is abnormal or we need to change your treatment, we will call you to review the results.   Testing/Procedures: None ordered.    Follow-Up: At [facility], you and your health needs are our priority.  As part of our continuing mission to provide you with exceptional heart care, we have created designated Provider Care Teams.  These Care Teams include your primary Cardiologist (physician) and Advanced Practice Providers (APPs -  Physician Assistants and Nurse Practitioners) who all work together to provide you with the care you need, when you need it.  We recommend signing up for the patient portal called "MyChart".  Sign up information is provided on this After Visit Summary.  MyChart is used to connect with patients for Virtual Visits (Telemedicine).  Patients are able to view lab/test results, encounter notes, upcoming appointments, etc.  Non-urgent messages can be sent to your provider as well.   To learn more about what you can do with MyChart, go to [email or web address].    Your next appointment:   6 months with [facility] Clinic

## 2023-03-07 ENCOUNTER — Other Ambulatory Visit: Payer: Self-pay | Admitting: Cardiology

## 2023-04-04 ENCOUNTER — Telehealth: Payer: Self-pay | Admitting: Cardiology

## 2023-04-04 MED ORDER — CHLORTHALIDONE 25 MG PO TABS: 25.0000 mg | ORAL_TABLET | Freq: Every day | ORAL | 2 refills | Status: AC

## 2023-04-04 NOTE — Telephone Encounter (Signed)
 Pt's medication was sent to pt's pharmacy as requested. Confirmation received.

## 2023-04-04 NOTE — Telephone Encounter (Signed)
*  STAT* If patient is at the pharmacy, call can be transferred to refill team.   1. Which medications need to be refilled? (please list name of each medication and dose if known) chlorthalidone (HYGROTON) 25 MG tablet    2. Would you like to learn more about the convenience, safety, & potential cost savings by using the [facility]? No   3. Are you open to using the [facility] (Type [facility] No   4. Which pharmacy/location (including street and city if local pharmacy) is medication to be sent to? [facility] - [place], [place] - [address]    5. Do they need a 30 day or 90 day supply? 90 day

## 2023-07-25 ENCOUNTER — Other Ambulatory Visit: Payer: Self-pay | Admitting: Cardiovascular Disease

## 2023-08-02 ENCOUNTER — Ambulatory Visit (HOSPITAL_COMMUNITY): Payer: Medicare Other | Admitting: Internal Medicine

## 2023-08-10 ENCOUNTER — Other Ambulatory Visit (HOSPITAL_COMMUNITY): Payer: Self-pay | Admitting: *Deleted

## 2023-08-10 MED ORDER — AMIODARONE HCL 200 MG PO TABS: 200.0000 mg | ORAL_TABLET | Freq: Every day | ORAL | 1 refills | Status: AC

## 2023-08-25 ENCOUNTER — Ambulatory Visit (HOSPITAL_COMMUNITY)
Admission: RE | Admit: 2023-08-25 | Discharge: 2023-08-25 | Disposition: A | Discharge status: Home / Self Care | Source: Ambulatory Visit | Attending: Internal Medicine | Admitting: Internal Medicine

## 2023-08-25 ENCOUNTER — Encounter (HOSPITAL_COMMUNITY): Payer: Self-pay | Admitting: Internal Medicine

## 2023-08-25 VITALS — BP 126/84 | HR 58 | Ht 70.0 in | Wt 220.4 lb

## 2023-08-25 DIAGNOSIS — D6869 Other thrombophilia: Secondary | ICD-10-CM | POA: Diagnosis not present

## 2023-08-25 DIAGNOSIS — Z79899 Other long term (current) drug therapy: Secondary | ICD-10-CM | POA: Diagnosis not present

## 2023-08-25 DIAGNOSIS — I4819 Other persistent atrial fibrillation: Secondary | ICD-10-CM | POA: Diagnosis not present

## 2023-08-25 DIAGNOSIS — Z5181 Encounter for therapeutic drug level monitoring: Secondary | ICD-10-CM | POA: Diagnosis not present

## 2023-08-25 NOTE — Progress Notes (Signed)
 Primary Care Physician: Tobie Guy, DO Referring Physician:Dr. Antonius Hartlage is a 83 y.o. male with a h/o afib ablation 09/2018 by Dr. Inocencio with post op complication of pseudoaneurysm. This improving slowly. He is walking more with less discomfort.No awareness of afib. Will reduce amiodarone  to 200 mg daily.  Continues on anticoagulation.   On evaluation today, he is currently in NSR. Patient contacted office on 12/2 noting he went to ED for 2 Afib episodes on 11/14 and 11/26; he required cardioversion in the ED. He is currently on Eliquis  2.5 mg BID. Takes Coreg  25 mg BID. Has PRN diltiazem . He is interested in better control to avoid the ER. Noted that his TSH was very abnormal initially; patient was without medication for 2 months due to PCP switching and not refilling his medication.   On follow up 08/25/23, patient is currently in NSR. He is doing well from an Afib standpoint since restarting amiodarone . He has had overall low Afib burden. No bleeding issues on Eliquis .   Today, he denies symptoms of palpitations, chest pain, shortness of breath, orthopnea, PND, lower extremity edema, dizziness, presyncope, syncope, or neurologic sequela. The patient is tolerating medications without difficulties and is otherwise without complaint today.   Past Medical History:  Diagnosis Date   Anginal pain (HCC)    CAD (coronary artery disease)    a, 2011: occluded LAD with left to right collaterals and moderate disease in the right coronary artery and circumflex, EF 50%. b. NSTEMI 07/2011 in setting of AF-RVR, MV abnl, cath w/ LAD 100%, RCA mod-severe dz, small vessel, med rx   Chronic renal insufficiency    followed by primary care physician   Gout    Hypertension    long-standing   Myocardial infarction [facility])    Paroxysmal atrial fibrillation (HCC)    a. On Coumadin  therapy/amidarone. b. 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   Pulmonary embolism  (HCC)    Stroke [facility])    Past Surgical History:  Procedure Laterality Date   APPENDECTOMY     ATRIAL FIBRILLATION ABLATION N/A 09/20/2018   Procedure: ATRIAL FIBRILLATION ABLATION;  Surgeon: Inocencio Soyla Lunger, MD;  Location: [facility] INVASIVE CV LAB;  Service: Cardiovascular;  Laterality: N/A;   CARDIAC CATHETERIZATION  2011 & 2013   2013 [place], LAD 100%, CFX 50%, RCA 80%, small vessel, med rx   CORONARY ARTERY BYPASS GRAFT N/A 02/14/2014   Procedure: CORONARY ARTERY BYPASS GRAFTING (CABG)X4 LIMA-LAD; SVG-RCA(ENDARDERECTOMY); SVG-OM; SVG-DIAG;  Surgeon: Maude Fleeta Ochoa, MD;  Location: [facility] OR;  Service: Open Heart Surgery;  Laterality: N/A;   FALSE ANEURYSM REPAIR Right 09/22/2018   Procedure: REPAIR FEMORAL PSEUDO ANEURYSM AND ARTERIOVENOUS FISTULA;  Surgeon: Harvey Carlin BRAVO, MD;  Location: [facility] OR;  Service: Vascular;  Laterality: Right;   LEFT HEART CATHETERIZATION WITH CORONARY ANGIOGRAM N/A 02/08/2014   Procedure: LEFT HEART CATHETERIZATION WITH CORONARY ANGIOGRAM;  Surgeon: Ozell JONETTA Fell, MD;  Location: [facility] CATH LAB;  Service: Cardiovascular;  Laterality: N/A;   NM MYOVIEW  LTD  2013   Scar in the anteroseptal/periapical distribution with moderate peri-infarct ischemia, EF 43%   STERNAL INCISION RECLOSURE N/A 02/28/2014   Procedure: Irrigation and Debridement of Sternum with Sternal Rewiring;  Surgeon: Maude Fleeta Ochoa, MD;  Location: [facility] OR;  Service: Thoracic;  Laterality: N/A;   TEE WITHOUT CARDIOVERSION N/A 02/14/2014   Procedure: TRANSESOPHAGEAL ECHOCARDIOGRAM (TEE);  Surgeon: Maude Fleeta Ochoa, MD;  Location: [facility] OR;  Service:  Open Heart Surgery;  Laterality: N/A;   TOTAL KNEE ARTHROPLASTY Bilateral     Current Outpatient Medications  Medication Sig Dispense Refill   allopurinol  (ZYLOPRIM ) 100 MG tablet Take 100 mg by mouth daily.     amiodarone  (PACERONE ) 200 MG tablet Take 1 tablet (200 mg total) by mouth daily. 90 tablet 1   apixaban  (ELIQUIS ) 2.5 MG TABS tablet Take 1 tablet (2.5 mg  total) by mouth 2 (two) times daily. 180 tablet 1   atorvastatin  (LIPITOR) 40 MG tablet TAKE ONE TABLET BY MOUTH EVERY DAY 90 tablet 2   calcium  carbonate (TUMS - DOSED IN MG ELEMENTAL CALCIUM ) 500 MG chewable tablet Chew 2 tablets by mouth as needed for indigestion or heartburn.      carvedilol  (COREG ) 25 MG tablet TAKE ONE TABLET BY MOUTH TWICE DAILY 180 tablet 3   chlorthalidone  (HYGROTON ) 25 MG tablet Take 1 tablet (25 mg total) by mouth daily. 90 tablet 2   Cholecalciferol (VITAMIN D3) 50 MCG (2000 UT) TABS Take 1 tablet by mouth daily at 6 (six) AM.     COLCRYS  0.6 MG tablet Take 0.6 mg by mouth as needed (gout).      diltiazem  (CARDIZEM ) 30 MG tablet Take 1 tablet (30 mg total) by mouth daily as needed (for palpitations). 45 tablet 3   fluticasone (FLONASE) 50 MCG/ACT nasal spray Place 1 spray into both nostrils daily.     levothyroxine  (SYNTHROID ) 50 MCG tablet Take 50 mcg by mouth daily before breakfast.      magnesium  oxide (MAG-OX) 400 (240 Mg) MG tablet Take 400 mg by mouth at bedtime.     multivitamin (THERAGRAN) per tablet Take 1 tablet by mouth daily.     Naphazoline HCl (CLEAR EYES OP) Place 1 drop into both eyes daily as needed (irritation).     neomycin-polymyxin b -dexamethasone  (MAXITROL) 3.5-10000-0.1 OINT Place into both eyes as needed (irritation).     Potassium Chloride  CR (MICRO-K ) 8 MEQ CPCR capsule CR Take by mouth 2 (two) times daily.     No current facility-administered medications for this encounter.    Allergies  Allergen Reactions   Azithromycin Nausea And Vomiting    throws for a loop   Oxycodone  Other (See Comments)    nightmares   ROS- All systems are reviewed and negative except as per the HPI above  Physical Exam: Vitals:   08/25/23 0818  BP: 126/84  Pulse: (!) 58  Weight: 100 kg  Height: 5' 10 (1.778 m)   Wt Readings from Last 3 Encounters:  08/25/23 100 kg  01/25/23 98.9 kg  12/10/22 96.3 kg    Labs: Lab Results  Component Value  Date   NA 141 09/01/2022   K 4.5 09/01/2022   CL 101 09/01/2022   CO2 23 09/01/2022   GLUCOSE 107 (H) 09/01/2022   BUN 35 (H) 09/01/2022   CREATININE 1.71 (H) 09/01/2022   CALCIUM  10.5 (H) 09/01/2022   PHOS 3.0 11/02/2009   MG 1.9 01/28/2016   Lab Results  Component Value Date   INR 1.8 (H) 07/17/2020   Lab Results  Component Value Date   CHOL 129 09/01/2022   HDL 41 09/01/2022   LDLCALC 69 09/01/2022   TRIG 101 09/01/2022   GEN- The patient is well appearing, alert and oriented x 3 today.   Neck - no JVD or carotid bruit noted Lungs- Clear to ausculation bilaterally, normal work of breathing Heart- Regular rate and rhythm, no murmurs, rubs or gallops, PMI not  laterally displaced Extremities- no clubbing, cyanosis, or edema Skin - no rash or ecchymosis noted   EKG- Vent. rate 58 BPM PR interval 176 ms QRS duration 98 ms QT/QTcB 458/449 ms P-R-T axes 46 48 66 Sinus bradycardia Low voltage QRS Cannot rule out Anteroseptal infarct (cited on or before 10-Dec-2022) Abnormal ECG When compared with ECG of 25-Jan-2023 08:38, Previous ECG is present  ECHO 11/16/21: 1. Left ventricular ejection fraction, by estimation, is 55 to 60%. The  left ventricle has normal function. The left ventricle demonstrates  regional wall motion abnormalities (see scoring diagram/findings for  description). Left ventricular diastolic  parameters are consistent with Grade I diastolic dysfunction (impaired  relaxation).   2. Right ventricular systolic function is normal. The right ventricular  size is normal. There is normal pulmonary artery systolic pressure. The  estimated right ventricular systolic pressure is 21.8 mmHg.   3. Left atrial size was mildly dilated.   4. The mitral valve is degenerative. Mild mitral valve regurgitation.   5. The aortic valve is tricuspid. There is moderate calcification of the  aortic valve. Aortic valve regurgitation is mild. Mild aortic valve  stenosis.  Aortic regurgitation PHT measures 630 msec. Aortic valve area,  by VTI measures 1.93 cm. Aortic valve  mean gradient measures 8.5 mmHg.   6. The inferior vena cava is normal in size with greater than 50%  respiratory variability, suggesting right atrial pressure of 3 mmHg.   Comparison(s): Prior images reviewed side by side. LVEF remains normal  range at 55-60%. Wall motion abnormalities are better seen on the present  study.    Assessment and Plan: 1. Persistent Afib  Patient appears to be maintaining sinus rhythm on amiodarone  and is happy with current management.   High risk medication monitoring (ICD10: [id number]) Patient requires ongoing monitoring for anti-arrhythmic medication which has the potential to cause life threatening arrhythmias or AV block. Qtc stable. Continue amiodarone  200 mg daily. Bmet and mag drawn today.  2. CHA2DS2VASc score of at least 5  Continue Eliquis .   3. History of Rt pseudoaneurysm Followed by Vascular   Follow up in 6 months for amiodarone  surveillance.    Dorn Heinrich, PA-C [facility] [address] [place], [place] [address] [phone]

## 2023-08-26 ENCOUNTER — Ambulatory Visit (HOSPITAL_COMMUNITY): Payer: Self-pay | Admitting: Internal Medicine

## 2023-08-26 LAB — COMPREHENSIVE METABOLIC PANEL WITH GFR
ALT: 29 IU/L (ref 0–44)
AST: 32 IU/L (ref 0–40)
Albumin: 4.4 g/dL (ref 3.7–4.7)
Alkaline Phosphatase: 83 IU/L (ref 44–121)
BUN/Creatinine Ratio: 8 — ABNORMAL LOW (ref 10–24)
BUN: 17 mg/dL (ref 8–27)
Bilirubin Total: 0.5 mg/dL (ref 0.0–1.2)
CO2: 21 mmol/L (ref 20–29)
Calcium: 9.6 mg/dL (ref 8.6–10.2)
Chloride: 104 mmol/L (ref 96–106)
Creatinine, Ser: 2.06 mg/dL — ABNORMAL HIGH (ref 0.76–1.27)
Globulin, Total: 2.9 g/dL (ref 1.5–4.5)
Glucose: 99 mg/dL (ref 70–99)
Potassium: 5 mmol/L (ref 3.5–5.2)
Sodium: 142 mmol/L (ref 134–144)
Total Protein: 7.3 g/dL (ref 6.0–8.5)
eGFR: 32 mL/min/1.73 — ABNORMAL LOW (ref >59)

## 2023-08-26 LAB — TSH: TSH: 7.62 u[IU]/mL — ABNORMAL HIGH (ref 0.450–4.500)

## 2023-11-10 ENCOUNTER — Other Ambulatory Visit: Payer: Self-pay | Admitting: Cardiovascular Disease

## 2023-11-10 DIAGNOSIS — I4819 Other persistent atrial fibrillation: Secondary | ICD-10-CM

## 2023-11-10 NOTE — Telephone Encounter (Signed)
 Prescription refill request for Eliquis  received. Indication: afib  Last office visit:08/25/2023 Scr: 2.06, 08/25/2023 Age: 83 yo  Weight: 100 kg   Refill sent

## 2023-11-15 ENCOUNTER — Other Ambulatory Visit (HOSPITAL_COMMUNITY): Payer: Self-pay

## 2023-12-19 ENCOUNTER — Other Ambulatory Visit: Payer: Self-pay

## 2023-12-21 MED ORDER — CARVEDILOL 25 MG PO TABS: 25.0000 mg | ORAL_TABLET | Freq: Two times a day (BID) | ORAL | 0 refills | Status: AC

## 2024-01-17 ENCOUNTER — Other Ambulatory Visit: Payer: Self-pay | Admitting: Cardiology

## 2024-01-17 MED ORDER — ATORVASTATIN CALCIUM 40 MG PO TABS: 40.0000 mg | ORAL_TABLET | Freq: Every day | ORAL | 0 refills | Status: AC

## 2024-02-24 ENCOUNTER — Ambulatory Visit (HOSPITAL_COMMUNITY): Admitting: Internal Medicine
